# Patient Record
Sex: Female | Born: 1950
Health system: Southern US, Community
[De-identification: ages and names within clinical notes are randomized; demographics above are authoritative.]

## PROBLEM LIST (undated history)

## (undated) DIAGNOSIS — M797 Fibromyalgia: Secondary | ICD-10-CM

## (undated) DIAGNOSIS — K219 Gastro-esophageal reflux disease without esophagitis: Secondary | ICD-10-CM

## (undated) DIAGNOSIS — F329 Major depressive disorder, single episode, unspecified: Secondary | ICD-10-CM

## (undated) DIAGNOSIS — F1921 Other psychoactive substance dependence, in remission: Secondary | ICD-10-CM

## (undated) DIAGNOSIS — M1990 Unspecified osteoarthritis, unspecified site: Secondary | ICD-10-CM

## (undated) DIAGNOSIS — F32A Depression, unspecified: Secondary | ICD-10-CM

## (undated) DIAGNOSIS — Z972 Presence of dental prosthetic device (complete) (partial): Secondary | ICD-10-CM

## (undated) HISTORY — PX: TUBAL LIGATION: SHX77

## (undated) HISTORY — PX: BREAST SURGERY: SHX581

---

## 2006-03-31 ENCOUNTER — Emergency Department: Payer: Self-pay | Admitting: Emergency Medicine

## 2006-04-05 ENCOUNTER — Emergency Department: Payer: Self-pay | Admitting: Emergency Medicine

## 2006-04-16 ENCOUNTER — Emergency Department: Payer: Self-pay | Admitting: Emergency Medicine

## 2007-07-16 ENCOUNTER — Emergency Department: Payer: Self-pay | Admitting: Emergency Medicine

## 2007-07-21 ENCOUNTER — Emergency Department: Payer: Self-pay | Admitting: Emergency Medicine

## 2007-12-20 ENCOUNTER — Other Ambulatory Visit: Payer: Self-pay

## 2007-12-20 ENCOUNTER — Emergency Department: Payer: Self-pay | Admitting: Emergency Medicine

## 2008-05-19 ENCOUNTER — Observation Stay: Payer: Self-pay | Admitting: Internal Medicine

## 2008-05-20 ENCOUNTER — Other Ambulatory Visit: Payer: Self-pay

## 2008-07-18 ENCOUNTER — Other Ambulatory Visit: Payer: Self-pay

## 2008-07-18 ENCOUNTER — Inpatient Hospital Stay: Payer: Self-pay | Admitting: Unknown Physician Specialty

## 2009-05-13 ENCOUNTER — Emergency Department: Payer: Self-pay | Admitting: Emergency Medicine

## 2009-10-25 ENCOUNTER — Ambulatory Visit: Payer: Self-pay | Admitting: Pain Medicine

## 2012-04-18 ENCOUNTER — Emergency Department: Payer: Self-pay | Admitting: Emergency Medicine

## 2012-04-18 LAB — CBC
HCT: 33.3 % — ABNORMAL LOW (ref 35.0–47.0)
HGB: 11.6 g/dL — ABNORMAL LOW (ref 12.0–16.0)
MCH: 34.2 pg — ABNORMAL HIGH (ref 26.0–34.0)
MCHC: 34.8 g/dL (ref 32.0–36.0)
MCV: 98 fL (ref 80–100)
Platelet: 157 10*3/uL (ref 150–440)
RBC: 3.39 10*6/uL — ABNORMAL LOW (ref 3.80–5.20)
RDW: 12.6 % (ref 11.5–14.5)
WBC: 9.8 10*3/uL (ref 3.6–11.0)

## 2012-04-18 LAB — URINALYSIS, COMPLETE
Bilirubin,UR: NEGATIVE
Ketone: NEGATIVE
Nitrite: NEGATIVE
Protein: 30
RBC,UR: 6 /HPF (ref 0–5)
Specific Gravity: 1.013 (ref 1.003–1.030)
Squamous Epithelial: 3

## 2012-04-18 LAB — BASIC METABOLIC PANEL
Anion Gap: 5 — ABNORMAL LOW (ref 7–16)
Calcium, Total: 8.5 mg/dL (ref 8.5–10.1)
Co2: 29 mmol/L (ref 21–32)
Creatinine: 0.71 mg/dL (ref 0.60–1.30)
EGFR (Non-African Amer.): >60
Glucose: 101 mg/dL — ABNORMAL HIGH (ref 65–99)
Potassium: 3.9 mmol/L (ref 3.5–5.1)
Sodium: 136 mmol/L (ref 136–145)

## 2012-04-20 LAB — URINE CULTURE

## 2014-01-01 LAB — COMPREHENSIVE METABOLIC PANEL
ALT: 81 U/L — AB (ref 12–78)
Albumin: 3.2 g/dL — ABNORMAL LOW (ref 3.4–5.0)
Alkaline Phosphatase: 77 U/L
Anion Gap: 6 — ABNORMAL LOW (ref 7–16)
BILIRUBIN TOTAL: 0.4 mg/dL (ref 0.2–1.0)
BUN: 17 mg/dL (ref 7–18)
CHLORIDE: 97 mmol/L — AB (ref 98–107)
CO2: 27 mmol/L (ref 21–32)
Calcium, Total: 8.7 mg/dL (ref 8.5–10.1)
Creatinine: 1.3 mg/dL (ref 0.60–1.30)
GFR CALC AF AMER: 51 — AB
GFR CALC NON AF AMER: 44 — AB
Glucose: 153 mg/dL — ABNORMAL HIGH (ref 65–99)
Osmolality: 265 (ref 275–301)
Potassium: 4.2 mmol/L (ref 3.5–5.1)
SGOT(AST): 85 U/L — ABNORMAL HIGH (ref 15–37)
Sodium: 130 mmol/L — ABNORMAL LOW (ref 136–145)
TOTAL PROTEIN: 7.5 g/dL (ref 6.4–8.2)

## 2014-01-01 LAB — CBC
HCT: 36.9 % (ref 35.0–47.0)
HGB: 12.7 g/dL (ref 12.0–16.0)
MCH: 33.6 pg (ref 26.0–34.0)
MCHC: 34.4 g/dL (ref 32.0–36.0)
MCV: 98 fL (ref 80–100)
Platelet: 134 10*3/uL — ABNORMAL LOW (ref 150–440)
RBC: 3.78 10*6/uL — AB (ref 3.80–5.20)
RDW: 12.9 % (ref 11.5–14.5)
WBC: 5.6 10*3/uL (ref 3.6–11.0)

## 2014-01-01 LAB — SALICYLATE LEVEL: SALICYLATES, SERUM: 2.5 mg/dL

## 2014-01-01 LAB — ETHANOL
Ethanol %: 0.009 % (ref 0.000–0.080)
Ethanol: 9 mg/dL

## 2014-01-01 LAB — ACETAMINOPHEN LEVEL: Acetaminophen: <2 ug/mL

## 2014-01-02 ENCOUNTER — Observation Stay: Payer: Self-pay | Admitting: Internal Medicine

## 2014-01-02 LAB — URINALYSIS, COMPLETE
BILIRUBIN, UR: NEGATIVE
Bilirubin,UR: NEGATIVE
Blood: NEGATIVE
Blood: NEGATIVE
Glucose,UR: NEGATIVE mg/dL (ref 0–75)
Glucose,UR: NEGATIVE mg/dL (ref 0–75)
Hyaline Cast: 10
KETONE: NEGATIVE
Ketone: NEGATIVE
Nitrite: NEGATIVE
Nitrite: NEGATIVE
PROTEIN: NEGATIVE
Ph: 6 (ref 4.5–8.0)
Ph: 5 (ref 4.5–8.0)
Protein: NEGATIVE
SPECIFIC GRAVITY: 1.015 (ref 1.003–1.030)
Specific Gravity: 1.011 (ref 1.003–1.030)
Squamous Epithelial: 1
WBC UR: 22 /HPF (ref 0–5)

## 2014-01-02 LAB — DRUG SCREEN, URINE
AMPHETAMINES, UR SCREEN: NEGATIVE (ref ?–1000)
AMPHETAMINES, UR SCREEN: NEGATIVE (ref ?–1000)
BARBITURATES, UR SCREEN: NEGATIVE (ref ?–200)
BENZODIAZEPINE, UR SCRN: NEGATIVE (ref ?–200)
Barbiturates, Ur Screen: NEGATIVE (ref ?–200)
Benzodiazepine, Ur Scrn: NEGATIVE (ref ?–200)
CANNABINOID 50 NG, UR ~~LOC~~: POSITIVE (ref ?–50)
COCAINE METABOLITE, UR ~~LOC~~: POSITIVE (ref ?–300)
COCAINE METABOLITE, UR ~~LOC~~: POSITIVE (ref ?–300)
Cannabinoid 50 Ng, Ur ~~LOC~~: POSITIVE (ref ?–50)
MDMA (Ecstasy)Ur Screen: NEGATIVE (ref ?–500)
MDMA (Ecstasy)Ur Screen: NEGATIVE (ref ?–500)
METHADONE, UR SCREEN: NEGATIVE (ref ?–300)
Methadone, Ur Screen: NEGATIVE (ref ?–300)
OPIATE, UR SCREEN: NEGATIVE (ref ?–300)
Opiate, Ur Screen: NEGATIVE (ref ?–300)
PHENCYCLIDINE (PCP) UR S: NEGATIVE (ref ?–25)
Phencyclidine (PCP) Ur S: NEGATIVE (ref ?–25)
TRICYCLIC, UR SCREEN: NEGATIVE (ref ?–1000)
Tricyclic, Ur Screen: NEGATIVE (ref ?–1000)

## 2014-01-03 LAB — CBC WITH DIFFERENTIAL/PLATELET
BASOS ABS: 0 10*3/uL (ref 0.0–0.1)
Basophil %: 0.5 %
EOS PCT: 9.7 %
Eosinophil #: 0.3 10*3/uL (ref 0.0–0.7)
HCT: 30.5 % — AB (ref 35.0–47.0)
HGB: 10.4 g/dL — ABNORMAL LOW (ref 12.0–16.0)
LYMPHS PCT: 48.3 %
Lymphocyte #: 1.3 10*3/uL (ref 1.0–3.6)
MCH: 33.8 pg (ref 26.0–34.0)
MCHC: 34.3 g/dL (ref 32.0–36.0)
MCV: 99 fL (ref 80–100)
MONO ABS: 0.7 x10 3/mm (ref 0.2–0.9)
Monocyte %: 24.3 %
NEUTROS ABS: 0.5 10*3/uL — AB (ref 1.4–6.5)
Neutrophil %: 17.2 %
PLATELETS: 84 10*3/uL — AB (ref 150–440)
RBC: 3.09 10*6/uL — AB (ref 3.80–5.20)
RDW: 12.7 % (ref 11.5–14.5)
WBC: 2.7 10*3/uL — ABNORMAL LOW (ref 3.6–11.0)

## 2014-01-03 LAB — BASIC METABOLIC PANEL
ANION GAP: 3 — AB (ref 7–16)
BUN: 14 mg/dL (ref 7–18)
CALCIUM: 7.4 mg/dL — AB (ref 8.5–10.1)
Chloride: 108 mmol/L — ABNORMAL HIGH (ref 98–107)
Co2: 28 mmol/L (ref 21–32)
Creatinine: 0.79 mg/dL (ref 0.60–1.30)
Glucose: 97 mg/dL (ref 65–99)
Osmolality: 278 (ref 275–301)
POTASSIUM: 3.6 mmol/L (ref 3.5–5.1)
Sodium: 139 mmol/L (ref 136–145)

## 2014-01-03 LAB — URINE CULTURE

## 2014-01-03 LAB — MAGNESIUM: MAGNESIUM: 1.9 mg/dL

## 2015-03-27 NOTE — H&P (Signed)
PATIENT NAME:  Vanessa Oneal, BOEHRINGER MR#:  [id number] DATE OF BIRTH:  11-Oct-1951  DATE OF ADMISSION:  01/01/2014  ADMITTING PHYSICIAN: Enid Baas, MD  PRIMARY CARE PHYSICIAN: Dr. Leta Baptist at [facility].   CHIEF COMPLAINT: Difficulty walking and confusion.   HISTORY OF PRESENT ILLNESS: Vanessa Oneal is a 64 year old Caucasian female with past medical history significant for ongoing smoking, depression, anxiety, and history of heroin abuse, currently on Suboxone, presents to the hospital secondary to confusion that started after  she got a pill from her granddaughter, which was supposed to be Dramamine. The patient says she was nauseous yesterday and wanted to take a dramamine to help with the nausea. Her granddaughter, who just came into the city, said that she had a Dramamine pill which will help with the nausea. The patient was given the pill, and she is not even sure if  she was given Dramamine or not. However, after taking the pill, the patient just felt confused and weird and called the ambulance and was brought to the ER. Since coming in to the ER, her blood work was negative. Her alcohol level was normal. However, urine tox screen was not done. The patient was also seen by behavioral medicine to see if she needs admission because of her confusion, though CT head was negative. However, at the time of my exam, the patient is alert and oriented, is very anxious and restless with myoclonic jerks. She was weak and could not even walk to the bathroom and was very unsteady, so medical admission was requested.   PAST MEDICAL HISTORY: 1.  Depression.  2.  History of heroin abuse.  3.  Anxiety.  4.  Degenerative arthritis.   PAST SURGICAL HISTORY: Breast implant and removal of breast implant after rupture.   ALLERGIES TO MEDICATIONS: CODEINE.   HOME MEDICATIONS:   1.  Suboxone 8 mg twice a day.  2.  Klonopin 0.5 mg p.o. b.i.d.   SOCIAL HISTORY: Lives at home with her sister. Smokes about 1/2 pack  per day. No alcohol use.  FAMILY HISTORY: Heart disease runs in the family.   REVIEW OF SYSTEMS:    CONSTITUTIONAL: No fever. Positive for fatigue and weakness.  EYES: No blurred vision, double vision, pain, inflammation or glaucoma.  EARS, NOSE, THROAT: No tinnitus, ear pain, hearing loss, epistaxis or discharge.  RESPIRATORY: Positive for cough that started today and sounds congested. No hemoptysis or COPD.  CARDIOVASCULAR: No chest pain, orthopnea, edema, arrhythmia, palpitations or syncope.  GASTROINTESTINAL: No nausea now or vomiting, abdominal pain, hematemesis or melena.  GENITOURINARY: No dysuria, hematuria, renal calculus, frequency or incontinence.  ENDOCRINE: No polyuria, nocturia, thyroid problems, heat or cold intolerance.  HEMATOLOGIC: No anemia, easy bruising or bleeding.  SKIN: No acne, rash or lesions.  MUSCULOSKELETAL: No neck, back, shoulder pain, arthritis or gout.  NEUROLOGIC: No numbness, weakness, CVA, TIA or seizures, but very ataxic gait.  PSYCHOLOGICAL: Positive for anxiety. No  insomnia or depression.   PHYSICAL EXAMINATION: VITAL SIGNS: Temperature 98.5 degrees Fahrenheit, pulse 94, respirations 18, blood pressure 99/54, pulse oximetry 96% on room air.  GENERAL: thin built, well nourished female lying in bed, not in any acute distress.  HEENT: Normocephalic, atraumatic. Pupils equal, round, reacting to light. Anicteric sclerae. Extraocular movements intact. Oropharynx clear without erythema, mass or exudates.  NECK: Supple. No thyromegaly, JVD or carotid bruits. No lymphadenopathy.  LUNGS: Moving air bilaterally. Decreased bibasilar breath sounds. No wheeze or crackles. No use of accessory muscles for breathing.  CARDIOVASCULAR:  S1, S2, regular rate and rhythm. No murmurs, rubs or gallops.  ABDOMEN: Soft, nontender, nondistended. No hepatosplenomegaly. Normal bowel sounds.  EXTREMITIES: No pedal edema, no clubbing or cyanosis, 2+ dorsalis pedis pulses palpable  bilaterally.  SKIN: No acne, rash or lesions.  LYMPHATIC: No cervical lymphadenopathy.  NEUROLOGIC: Cranial nerves II through XII remain intact. Motor strength in bed is almost 5/5 all 4 extremities, and sensation is intact. However, she does have myoclonic jerks and very unsteady when even trying to stand up.  PSYCHIATRIC: The patient is awake, alert, oriented x 3 at this time.   LABORATORY AND RADIOLOGICAL DATA: WBC 5.6, hemoglobin 12.7, hematocrit 36.9, platelet count 134.   Sodium 130, potassium 4.2, chloride 97, bicarbonate 27, BUN 17, creatinine 1.3, glucose 153, and calcium of 8.7.   ALT 81, AST 85, alkaline phosphatase 77, total bilirubin 0.4, albumin of 3.2.   Alcohol level is negative. Serum acetaminophen and salicylate levels are negative. Urine tox screen is pending at this time.   Urinalysis with 3+ leukocyte esterase and trace bacteria and some WBCs noted.   CT of the head showing mild generalized atrophy and no acute intracranial finding.   ASSESSMENT AND PLAN: A 64 year old female with a history of heroin abuse in the past, on Suboxone currently, and anxiety, brought for confusion and ataxia after taking an unknown pill.  1.  Altered mental status and ataxia, likely metabolic in nature after taking an unknown pill/drug given by granddaughter. Mental status has improved, but still has some myoclonic jerks and ataxia. Urine drug screen is pending at this time, unfortunately was not done yesterday. Alcohol level is negative. Will admit under observation, give fluids, physical therapy. CT of the head is negative at this time. Continue to monitor.  2.  History of heroin abuse: Restart Suboxone, otherwise will go into withdrawals. 3.  Anxiety: Continue Klonopin.  4.  Urinary tract infection: Levaquin for 3 days.  5.  Tobacco use disorder: Counseled against smoking for 3 minutes. The patient refused a nicotine patch or Nicotrol inhaler at this time.   CODE STATUS: Full code.    TIME SPENT ON ADMISSION: 50 minutes.   ____________________________ Enid Baas, MD rk:jcm D: 01/02/2014 17:22:44 ET T: 01/02/2014 18:19:13 ET JOB#: [id number]  cc: Enid Baas, MD, <Dictator> Thea Alken, MD Enid Baas MD ELECTRONICALLY SIGNED 01/08/2014 15:08

## 2015-03-27 NOTE — Consult Note (Signed)
Brief Consult Note: Diagnosis: Opioid dependence, anxiety disorder NOS.   Patient was seen by consultant.   Consult note dictated.   Recommend further assessment or treatment.   Comments: Ms. Irion has a h/o anxiety, mood instability and substance abuse. She has been on Suboxone. She was brought to the ER after ingesting Dramamine with her Suboxone that made her feel nauseated and "weird". She denies that it was suicide attempt. See full psychiatric consult.  PLAN: 1. The patient does not meet criteria for IVC.  2. Please continue Suboxone.  3. No medications recommended at this point.  Electronic Signatures: Kristine Linea (MD)  (Signed 30-Jan-15 20:01)  Authored: Brief Consult Note   Last Updated: 30-Jan-15 20:01 by Kristine Linea (MD)

## 2015-03-27 NOTE — Consult Note (Signed)
PATIENT NAME:  Vanessa Oneal, Vanessa Oneal MR#:  [id number] DATE OF BIRTH:  [birth year]  DATE OF CONSULTATION:  01/02/2014  REFERRING PHYSICIAN:  Dr. Carollee Massed. CONSULTING PHYSICIAN:  Isahi Godwin B. Rorik Vespa, MD  REASON FOR CONSULTATION:  To evaluate a patient after overdose.   IDENTIFYING DATA:  Vanessa Oneal is a 64 year old female with history of substance abuse, depression and anxiety.   CHIEF COMPLAINT:  "I feel weak."    HISTORY OF PRESENT ILLNESS:  I was asked to evaluate Vanessa Oneal when she was still in the Emergency Room.  The patient was brought to the Emergency Room with some confusion, feeling badly, weak, unable to walk with difficulties thinking.  She believes that she took a dose of Dramamine together with Suboxone.  Suboxone is prescribed by Dr. Creola Corn at the [facility].  The Dramamine that the patient has never tried before was given by her granddaughter.  The combination turned very uncomfy and she ended up in the hospital.  At the time of evaluation the patient felt very weak and even though she did not want to come to the hospital, she felt that she needs to stay here a little longer.  Interestingly enough, when Vanessa Oneal was brought to the hospital her granddaughter and her son followed.  They both are currently patients in our Emergency Room.  They all abuse substances and it is impossible to find out which substances or medications were used.  Unfortunately at the time of admission, no urine tox screen was performed.  We know that the patient was negative for alcohol.  Two medications listed as prescribed is Suboxone and Klonopin.  Klonopin is given by her primary care physician at [facility].  At the time when I evaluated the patient she felt much better mentally, was no longer confused and could think straight, did not have any psychotic symptoms or excessive anxiety physically, however she felt exhausted.  She was eventually admitted to the medical floor for ataxia.  Of note,  when I reviewed the patient's chart I noticed that she was hospitalized at [facility] for depression and substance abuse in August of 2009.  At that time a thorough neurological examination was performed by Dr. Alycia Rossetti and there were multiple findings.  The patient walked with a limp.  She complained that she could not feel anything in her right leg and indeed Dr. Alycia Rossetti noticed that there was decreased vibration sensitivity, light touch and pain in the right lower extremity.  The patient also complained of cramping.  Dr. Alycia Rossetti describes "peculiar movement disorder.  The patient was tremulous on both, slightly on the finger-to-nose and occasionally had some jerks that resembled myoclonus."  The patient has been seen by a neurologist.  She is being treated for fibromyalgia.   PAST PSYCHIATRIC HISTORY:  There is a history of substance use and two hospitalizations, one at [facility] in 2009 for depression and substance abuse.  She has a history of pain killers and heroin addiction and has been on Suboxone for 5 years now.  She is doing fine and denies using illicit substances.  She denies ever attempting suicide.  She denies ever being hospitalized for substances abuse treatment.   PAST MEDICAL HISTORY:  Arthritis, fibromyalgia.   ALLERGIES:  CODEINE.   MEDICATIONS ON ADMISSION:  Suboxone 8 mg twice daily, Klonopin 0.5 mg twice daily.   SOCIAL HISTORY:  She lives with her sister who is disabled and on oxygen and  reportedly the patient takes care of her.  She has Medicaid.   REVIEW OF SYSTEMS:  CONSTITUTIONAL:  No fevers or chills.  No weight changes.  EYES:  No double or blurred vision.  EARS, NOSE, THROAT:  No hearing loss.  RESPIRATORY:  No shortness of breath or cough.  CARDIOVASCULAR:  No chest pain or orthopnea.  GASTROINTESTINAL:  No abdominal pain, nausea, vomiting, or diarrhea.  GENITOURINARY:  No incontinence or frequency.   ENDOCRINE:  No heat or cold intolerance.  LYMPHATIC:  No anemia or easy bruising.  INTEGUMENTARY:  No acne or rash.  MUSCULOSKELETAL:  Positive for pain and now weakness following ingestion of Dramamine.  NEUROLOGIC:  Positive for myoclonic jerks and unsteady gait.  PSYCHIATRIC:  See history of present illness for details.   PHYSICAL EXAMINATION: VITAL SIGNS:  Blood pressure 109/70, pulse 76, respirations 18, temperature 98.4.  GENERAL:  This is a slender female complaining of weakness.  The rest of the physical examination is deferred to her primary attending.   LABORATORY DATA:  Chemistries:  Blood glucose 153, BUN 17, creatinine 1.3, sodium 130, potassium 4.2.  Blood alcohol level is 0.009.  LFTs within normal limits except for AST of 85 and ALT 81.  Urine tox screen is positive for cocaine and cannabinoids.  It was not available earlier.  CBC within normal limits.  Urinalysis is suggestive of urinary tract infection with 3+ leukocyte esterase and 25 white cells per field.  Serum acetaminophen and salicylate are low.   MENTAL STATUS EXAMINATION:  The patient is alert and oriented to person, place and situation.  She is uncertain what happened to bring her to the hospital and believes that this was ingestion of Dramamine together with Suboxone.  She reports that never in her life she took Dramamine before and that it was given by her granddaughter.  She is pleasant, polite and cooperative.  She maintains good eye contact.  She is wearing hospital scrubs and a yellow shirt.  Her speech is soft.  Mood is fine with anxious affect.  Thought process is logical and goal-oriented.  Thought content:  She denies suicidal or homicidal ideation.  Adamantly denies that she overdosed on purpose.  There are no delusions or paranoia.  There are no auditory or visual hallucinations.  Her cognition is grossly intact. She registers 3/3 and recalls 3/3 objects after 5 minutes. She can spell "world" forward and  backward. She knows current president. Her intelligence and fund of knowledge are average.  Her insight and judgment are poor.   DIAGNOSES: AXIS I:  Opiate dependence, major depressive disorder by history, anxiety disorder, not otherwise specified.  AXIS II:  Deferred.  AXIS III:  Arthritis, fibromyalgia, status post ingestion of Dramamine, myoclonus.  AXIS IV:  Mental and physical illness, substance abuse.  AXIS V:  Global assessment of functioning 50.   PLAN:   1.  The patient does not meet criteria for involuntary inpatient psychiatric commitment.  2.  Apparently the patient will be admitted to medicine.   3.  We do not recommend any medications at this point except to continue Suboxone.  4.  Psychiatry will follow up if necessary.  She will follow up with [facility].    ____________________________ Ellin Goodie. Jennet Maduro, MD jbp:ea D: 01/02/2014 19:55:35 ET T: 01/02/2014 22:46:08 ET JOB#: [id number]  cc: Hadiyah Maricle B. Jennet Maduro, MD, <Dictator> Shari Prows MD ELECTRONICALLY SIGNED 01/31/2014 6:54

## 2015-03-27 NOTE — Discharge Summary (Signed)
PATIENT NAME:  Vanessa Oneal, Vanessa Oneal MR#:  [id number] DATE OF BIRTH:  1951/11/26  DATE OF ADMISSION:  01/02/2014 DATE OF DISCHARGE:  01/05/2014  ADMITTING PHYSICIAN: Dr. Enid Baas.    DISCHARGING PHYSICIAN: Dr. Enid Baas.   PRIMARY CARE PHYSICIAN: Nonlocal at [facility].   CONSULTATIONS IN THE HOSPITAL: Psychiatric consultation by Dr. Jennet Maduro.   DISCHARGE DIAGNOSES:  1. Metabolic encephalopathy.  2. Ataxia.  3. Urinary tract infection.  4. Chronic obstructive pulmonary disease exacerbation with bronchitis.  5. History of heroin abuse, currently on Suboxone.  6. Anxiety disorder.  7. Polysubstance abuse.    DISCHARGE HOME MEDICATIONS:  1. Prednisone taper over the next 3 days.  2. Advair 250/50 one puff b.i.d.  3. Suboxone 8 mg sublingual twice a day.  4. Klonopin 0.5 mg q.8 hours p.r.n. for anxiety.  5. Combivent Respimat 1 puff 4 times a day.  6. Levaquin 500 mg daily for 4 more days.   DISCHARGE HOME OXYGEN: None.   DISCHARGE DIET: Regular diet.   DISCHARGE ACTIVITY: As tolerated.   FOLLOWUP INSTRUCTIONS:  1. PCP followup in 1 to 2 weeks.  2. Psychiatric followup as recommended.   LABS AND IMAGING STUDIES PRIOR TO DISCHARGE: WBC 2.7, hemoglobin 10.4, hematocrit 30.5, platelet count 84.   Sodium 139, potassium 3.6, chloride 108, bicarb 28, BUN 14, creatinine 0.7, glucose 97 and calcium of 7.4. Magnesium 1.9. Urinalysis with 3+ leuk esterase, a few bacteria and 19 WBCs. Urine tox screen positive for cocaine and marijuana. Urine cultures are negative. CT of the head showing mild atrophy and no acute intracranial changes. Alcohol level, salicylate and acetaminophen level were negative on admission.   BRIEF HOSPITAL COURSE: Vanessa Oneal is a 64 year old Caucasian female very cachectic appearing with ongoing history of smoking, anxiety and history of heroin abuse in the past. Presented to the hospital secondary to worsening ataxia and change in mental status. The patient  was in the ED behavioral medicine observation for 1 day for her changes; however, with no improvement, medical consult was requested. She was admitted to the medical floor for metabolic encephalopathy secondary to an unknown pill given by her granddaughter. The patient does have a history of heroin abuse and is currently on Suboxone. Urine tox was positive for cocaine and marijuana. Her labs also showed positive UTI.   1. Ataxia and metabolic encephalopathy: Likely drug induced. The patient was very ataxic working with physical therapy who recommended rehab initially; however, with IV hydration and over time, her mental status improved and she was able to ambulate without any effort, so is being discharged home.  2. UTI: She was on Levaquin.  3. COPD exacerbation with acute bronchitis with ongoing smoking: She refused nicotine patch while in the hospital. She was strongly counseled against smoking. Started on prednisone taper and Levaquin which improved her symptoms a lot.  4. Anxiety: The patient on Klonopin p.r.n.  5. History of heroin abuse: The patient goes to [facility] for her Suboxone and takes it twice a day.   Her course has been otherwise uneventful in the hospital.   DISCHARGE CONDITION: Stable.   DISCHARGE DISPOSITION: Home.   TIME SPENT ON DISCHARGE: 40 minutes.   ____________________________ Enid Baas, MD rk:gb D: 01/05/2014 15:58:55 ET T: 01/06/2014 02:12:08 ET JOB#: [id number]  cc: Enid Baas, MD, <Dictator> Enid Baas MD ELECTRONICALLY SIGNED 01/08/2014 15:09

## 2016-02-09 ENCOUNTER — Emergency Department
Admission: EM | Admit: 2016-02-09 | Discharge: 2016-02-09 | Disposition: A | Discharge status: Home / Self Care | Payer: Commercial Managed Care - HMO | Attending: Emergency Medicine | Admitting: Emergency Medicine

## 2016-02-09 ENCOUNTER — Encounter: Payer: Self-pay | Admitting: *Deleted

## 2016-02-09 DIAGNOSIS — F17200 Nicotine dependence, unspecified, uncomplicated: Secondary | ICD-10-CM | POA: Diagnosis not present

## 2016-02-09 DIAGNOSIS — F69 Unspecified disorder of adult personality and behavior: Secondary | ICD-10-CM | POA: Diagnosis present

## 2016-02-09 DIAGNOSIS — F1910 Other psychoactive substance abuse, uncomplicated: Secondary | ICD-10-CM

## 2016-02-09 LAB — COMPREHENSIVE METABOLIC PANEL
ALK PHOS: 82 U/L (ref 38–126)
ALT: 21 U/L (ref 14–54)
AST: 39 U/L (ref 15–41)
Albumin: 3.7 g/dL (ref 3.5–5.0)
Anion gap: 7 (ref 5–15)
BUN: 15 mg/dL (ref 6–20)
CALCIUM: 9.2 mg/dL (ref 8.9–10.3)
CHLORIDE: 100 mmol/L — AB (ref 101–111)
CO2: 30 mmol/L (ref 22–32)
CREATININE: 0.78 mg/dL (ref 0.44–1.00)
GFR calc Af Amer: >60 mL/min (ref >60)
GFR calc non Af Amer: >60 mL/min (ref >60)
GLUCOSE: 98 mg/dL (ref 65–99)
Potassium: 4.2 mmol/L (ref 3.5–5.1)
SODIUM: 137 mmol/L (ref 135–145)
Total Bilirubin: 0.6 mg/dL (ref 0.3–1.2)
Total Protein: 7.2 g/dL (ref 6.5–8.1)

## 2016-02-09 LAB — URINE DRUG SCREEN, QUALITATIVE (ARMC ONLY)
AMPHETAMINES, UR SCREEN: NOT DETECTED
BENZODIAZEPINE, UR SCRN: POSITIVE — AB
Barbiturates, Ur Screen: NOT DETECTED
CANNABINOID 50 NG, UR ~~LOC~~: NOT DETECTED
Cocaine Metabolite,Ur ~~LOC~~: POSITIVE — AB
MDMA (Ecstasy)Ur Screen: NOT DETECTED
Methadone Scn, Ur: NOT DETECTED
OPIATE, UR SCREEN: NOT DETECTED
PHENCYCLIDINE (PCP) UR S: NOT DETECTED
Tricyclic, Ur Screen: NOT DETECTED

## 2016-02-09 LAB — CBC
HEMATOCRIT: 32.4 % — AB (ref 35.0–47.0)
Hemoglobin: 11 g/dL — ABNORMAL LOW (ref 12.0–16.0)
MCH: 31.9 pg (ref 26.0–34.0)
MCHC: 34 g/dL (ref 32.0–36.0)
MCV: 93.7 fL (ref 80.0–100.0)
PLATELETS: 179 10*3/uL (ref 150–440)
RBC: 3.46 MIL/uL — ABNORMAL LOW (ref 3.80–5.20)
RDW: 13.5 % (ref 11.5–14.5)
WBC: 9.8 10*3/uL (ref 3.6–11.0)

## 2016-02-09 LAB — ETHANOL: Alcohol, Ethyl (B): 23 mg/dL — ABNORMAL HIGH (ref <5)

## 2016-02-09 NOTE — ED Notes (Signed)
Pt brought in by case worker.  Pt reports she has been stressed out and not eating and has a weight loss of 30 pounds recently.  Pt denies SI or HI.  Pt used cocaine last night.  Pt calm and cooperative.

## 2016-02-09 NOTE — Discharge Instructions (Signed)
Polysubstance Abuse °When people abuse more than one drug or type of drug it is called polysubstance or polydrug abuse. For example, many smokers also drink alcohol. This is one form of polydrug abuse. Polydrug abuse also refers to the use of a drug to counteract an unpleasant effect produced by another drug. It may also be used to help with withdrawal from another drug. People who take stimulants may become agitated. Sometimes this agitation is countered with a tranquilizer. This helps protect against the unpleasant side effects. Polydrug abuse also refers to the use of different drugs at the same time.  °Anytime drug use is interfering with normal living activities, it has become abuse. This includes problems with family and friends. Psychological dependence has developed when your mind tells you that the drug is needed. This is usually followed by physical dependence which has developed when continuing increases of drug are required to get the same feeling or "high". This is known as addiction or chemical dependency. A person's risk is much higher if there is a history of chemical dependency in the family. °SIGNS OF CHEMICAL DEPENDENCY °· You have been told by friends or family that drugs have become a problem. °· You fight when using drugs. °· You are having blackouts (not remembering what you do while using). °· You feel sick from using drugs but continue using. °· You lie about use or amounts of drugs (chemicals) used. °· You need chemicals to get you going. °· You are suffering in work performance or in school because of drug use. °· You get sick from use of drugs but continue to use anyway. °· You need drugs to relate to people or feel comfortable in social situations. °· You use drugs to forget problems. °"Yes" answered to any of the above signs of chemical dependency indicates there are problems. The longer the use of drugs continues, the greater the problems will become. °If there is a family history of  drug or alcohol use, it is best not to experiment with these drugs. Continual use leads to tolerance. After tolerance develops more of the drug is needed to get the same feeling. This is followed by addiction. With addiction, drugs become the most important part of life. It becomes more important to take drugs than participate in the other usual activities of life. This includes relating to friends and family. Addiction is followed by dependency. Dependency is a condition where drugs are now needed not just to get high, but to feel normal. °Addiction cannot be cured but it can be stopped. This often requires outside help and the care of professionals. Treatment centers are listed in the yellow pages under: Cocaine, Narcotics, and Alcoholics Anonymous. Most hospitals and clinics can refer you to a specialized care center. Talk to your caregiver if you need help. °  °This information is not intended to replace advice given to you by your health care provider. Make sure you discuss any questions you have with your health care provider. °  °Document Released: 07/12/2005 Document Revised: 02/12/2012 Document Reviewed: 11/25/2014 °Elsevier Interactive Patient Education ©2016 [employer] ° °

## 2016-02-09 NOTE — ED Notes (Signed)
Pt presents to ED after being sent by her counselor. Pt states she has lost 30 lbs over the last few months. Pt noted to be fidgety during assessment. Pt states she "did a few lines of cocaine last night" with a friend of hers. Pt denies SI/HI at this time. Pt states she came to ED for medical evaluation of weight loss and current health status. Denies any problems at this time.

## 2016-02-09 NOTE — ED Notes (Signed)
Went in to wake pt up and to tell pt that she was being discharged. Asked pt if there was someone that she could call and get to pick her up. Pt shook her head no. Pt stated, "I'm sick, I haven't taken any medicine since 9 this morning." This RN asked what kind of medication she was referring to. Pt mumbled, "Suboxon." Told pt that we don't have any of that here. Pt stated that she needed some kind of medicine while falling asleep mid-sentence. This RN reiterated to pt that she was being discharged. Pt stated that she needed her cellphone to call people because she didn't know any numbers from memory. This RN told pt that I would get her cellphone from her belongings so that she could call someone. This RN went to get cellphone from belongings, but was unable to find cellphone among the many bags that pt had in the green wagon. Went back in the ask pt if she could recall which bag her cellphone may be in, but pt was too sleepy to recall. Will try again later.

## 2016-02-09 NOTE — ED Provider Notes (Signed)
[facility] Emergency Department Provider Note  ____________________________________________   I have reviewed the triage vital signs and the nursing notes.   HISTORY  Chief Complaint Behavior Problem    HPI Vanessa Oneal is a 65 y.o. female who presents here today because she states she wants Suboxone. She states she ran out. She does admit to using cocaine and drinking alcohol today. Has no SI or HI. Patient states she has been losing weight because she has not wanted to eat. She denies any other complaint except for wanting Suboxone when she mentioned 4 times. She recently says she's had no nausea no vomiting no diarrhea. She denies any thoughts of herself or anybody else. He has no other complaints  No past medical history on file.  There are no active problems to display for this patient.   No past surgical history on file.  No current outpatient prescriptions on file.  Allergies Gabapentin  No family history on file.  Social History Social History  Substance Use Topics  . Smoking status: Current Every Day Smoker  . Smokeless tobacco: None  . Alcohol Use: Yes    Review of Systems Constitutional: No fever/chills Eyes: No visual changes. ENT: No sore throat. No stiff neck no neck pain Cardiovascular: Denies chest pain. Respiratory: Denies shortness of breath. Gastrointestinal:   no vomiting.  No diarrhea.  No constipation. Genitourinary: Negative for dysuria. Musculoskeletal: Negative lower extremity swelling Skin: Negative for rash. Neurological: Negative for headaches, focal weakness or numbness. 10-point ROS otherwise negative.  ____________________________________________   PHYSICAL EXAM:  VITAL SIGNS: ED Triage Vitals  Enc Vitals Group     BP 02/09/16 1707 126/76 mmHg     Pulse Rate 02/09/16 1707 87     Resp 02/09/16 1707 20     Temp 02/09/16 1707 98.3 F (36.8 C)     Temp Source 02/09/16 1707 Oral     SpO2 02/09/16  1707 96 %     Weight 02/09/16 1707 75 lb (34.02 kg)     Height --      Head Cir --      Peak Flow --      Pain Score 02/09/16 1711 8     Pain Loc --      Pain Edu? --      Excl. in GC? --     Constitutional: Alert and oriented. Patient is very poorly nourished at baseline in appearance but not otherwise toxic Eyes: Conjunctivae are normal. PERRL. EOMI. Head: Atraumatic. Nose: No congestion/rhinnorhea. Mouth/Throat: Mucous membranes are moist.  Oropharynx non-erythematous. Neck: No stridor.   Nontender with no meningismus Cardiovascular: Normal rate, regular rhythm. Grossly normal heart sounds.  Good peripheral circulation. Respiratory: Normal respiratory effort.  No retractions. Lungs CTAB. Abdominal: Soft and nontender. No distention. No guarding no rebound Back:  There is no focal tenderness or step off there is no midline tenderness there are no lesions noted. there is no CVA tenderness Musculoskeletal: No lower extremity tenderness. No joint effusions, no DVT signs strong distal pulses no edema Neurologic:  Normal speech and language. No gross focal neurologic deficits are appreciated.  Skin:  Skin is warm, dry and intact. No rash noted. Psychiatric: Mood and affect are normal. Speech and behavior are normal.  ____________________________________________   LABS (all labs ordered are listed, but only abnormal results are displayed)  Labs Reviewed  COMPREHENSIVE METABOLIC PANEL - Abnormal; Notable for the following:    Chloride 100 (*)    All other  components within normal limits  ETHANOL - Abnormal; Notable for the following:    Alcohol, Ethyl (B) 23 (*)    All other components within normal limits  CBC - Abnormal; Notable for the following:    RBC 3.46 (*)    Hemoglobin 11.0 (*)    HCT 32.4 (*)    All other components within normal limits  URINE DRUG SCREEN, QUALITATIVE ([facility] ONLY) - Abnormal; Notable for the following:    Cocaine Metabolite,Ur [place] POSITIVE (*)     Benzodiazepine, Ur Scrn POSITIVE (*)    All other components within normal limits   ____________________________________________  EKG  I personally interpreted any EKGs ordered by me or triage  ____________________________________________  RADIOLOGY  I reviewed any imaging ordered by me or triage that were performed during my shift and, if possible, patient and/or family made aware of any abnormal findings. ____________________________________________   PROCEDURES  Procedure(s) performed: None  Critical Care performed: None  ____________________________________________   INITIAL IMPRESSION / ASSESSMENT AND PLAN / ED COURSE  Pertinent labs & imaging results that were available during my care of the patient were reviewed by me and considered in my medical decision making (see chart for details).  Patient states she is not here because of her weight loss she is here because she wants me to give her narcotic pain medications. I am not going to do so. She states that she has had Suboxone here in the past and that is why she is here. There is no evidence of SI or HI in her customary screening exam does not show any acute emergency condition today. We'll refer her to outpatient follow-up for her chronic issues including weight loss and substance abuse. ____________________________________________   FINAL CLINICAL IMPRESSION(S) / ED DIAGNOSES  Final diagnoses:  None      This chart was dictated using voice recognition software.  Despite best efforts to proofread,  errors can occur which can change meaning.     Jeanmarie Plant, MD 02/09/16 [phone]

## 2016-02-09 NOTE — ED Notes (Signed)
Discharge instructions reviewed with patient. Patient verbalized understanding. Patient ambulated to lobby without difficulty. Pt's belongings taken to lobby in green peds wagon with her.

## 2016-02-09 NOTE — [facility] Counselor (Signed)
TTS counselor provided resources for [facility], [facility] and [facility] outpatient services.  Pt should also follow up with her addictions therapist at [facility].

## 2016-02-09 NOTE — ED Notes (Signed)
Brought in for Mental health eval

## 2016-05-22 DIAGNOSIS — E46 Unspecified protein-calorie malnutrition: Secondary | ICD-10-CM | POA: Insufficient documentation

## 2016-05-30 DIAGNOSIS — E559 Vitamin D deficiency, unspecified: Secondary | ICD-10-CM | POA: Insufficient documentation

## 2017-10-17 ENCOUNTER — Ambulatory Visit: Payer: Self-pay | Admitting: Family Medicine

## 2017-11-19 ENCOUNTER — Ambulatory Visit (INDEPENDENT_AMBULATORY_CARE_PROVIDER_SITE_OTHER): Payer: Medicare Other | Admitting: Family Medicine

## 2017-11-19 ENCOUNTER — Ambulatory Visit
Admission: RE | Admit: 2017-11-19 | Discharge: 2017-11-19 | Disposition: A | Discharge status: Home / Self Care | Payer: Medicare Other | Source: Ambulatory Visit | Attending: Family Medicine | Admitting: Family Medicine

## 2017-11-19 ENCOUNTER — Encounter: Payer: Self-pay | Admitting: Family Medicine

## 2017-11-19 VITALS — BP 129/77 | HR 69 | Temp 97.6°F | Ht 59.4 in | Wt 81.2 lb

## 2017-11-19 DIAGNOSIS — Z1211 Encounter for screening for malignant neoplasm of colon: Secondary | ICD-10-CM | POA: Diagnosis not present

## 2017-11-19 DIAGNOSIS — M791 Myalgia, unspecified site: Secondary | ICD-10-CM

## 2017-11-19 DIAGNOSIS — M542 Cervicalgia: Secondary | ICD-10-CM

## 2017-11-19 DIAGNOSIS — F1110 Opioid abuse, uncomplicated: Secondary | ICD-10-CM

## 2017-11-19 DIAGNOSIS — Z13220 Encounter for screening for lipoid disorders: Secondary | ICD-10-CM | POA: Diagnosis not present

## 2017-11-19 DIAGNOSIS — M19041 Primary osteoarthritis, right hand: Secondary | ICD-10-CM

## 2017-11-19 DIAGNOSIS — R202 Paresthesia of skin: Secondary | ICD-10-CM

## 2017-11-19 DIAGNOSIS — F419 Anxiety disorder, unspecified: Secondary | ICD-10-CM

## 2017-11-19 DIAGNOSIS — M5030 Other cervical disc degeneration, unspecified cervical region: Secondary | ICD-10-CM | POA: Insufficient documentation

## 2017-11-19 DIAGNOSIS — M4312 Spondylolisthesis, cervical region: Secondary | ICD-10-CM | POA: Insufficient documentation

## 2017-11-19 DIAGNOSIS — Z1159 Encounter for screening for other viral diseases: Secondary | ICD-10-CM | POA: Diagnosis not present

## 2017-11-19 DIAGNOSIS — Z23 Encounter for immunization: Secondary | ICD-10-CM | POA: Diagnosis not present

## 2017-11-19 DIAGNOSIS — M1990 Unspecified osteoarthritis, unspecified site: Secondary | ICD-10-CM | POA: Insufficient documentation

## 2017-11-19 DIAGNOSIS — Z1231 Encounter for screening mammogram for malignant neoplasm of breast: Secondary | ICD-10-CM

## 2017-11-19 DIAGNOSIS — M19042 Primary osteoarthritis, left hand: Secondary | ICD-10-CM

## 2017-11-19 DIAGNOSIS — Z1239 Encounter for other screening for malignant neoplasm of breast: Secondary | ICD-10-CM

## 2017-11-19 DIAGNOSIS — Z780 Asymptomatic menopausal state: Secondary | ICD-10-CM | POA: Diagnosis not present

## 2017-11-19 DIAGNOSIS — R636 Underweight: Secondary | ICD-10-CM

## 2017-11-19 MED ORDER — CITALOPRAM HYDROBROMIDE 20 MG PO TABS: ORAL_TABLET | ORAL | 3 refills | Status: DC

## 2017-11-19 NOTE — Patient Instructions (Addendum)
[facility] at [place] [facility]  Address: [address] [place], [place], [place] [address]  Phone: [phone]  [facility] [phone]       Influenza (Flu) Vaccine (Inactivated or Recombinant): What You Need to Know 1. Why get vaccinated? Influenza ("flu") is a contagious disease that spreads around the [place] every year, usually between October and May. Flu is caused by influenza viruses, and is spread mainly by coughing, sneezing, and close contact. Anyone can get flu. Flu strikes suddenly and can last several days. Symptoms vary by age, but can include:  fever/chills  sore throat  muscle aches  fatigue  cough  headache  runny or stuffy nose  Flu can also lead to pneumonia and blood infections, and cause diarrhea and seizures in children. If you have a medical condition, such as heart or lung disease, flu can make it worse. Flu is more dangerous for some people. Infants and young children, people 66 years of age and older, pregnant women, and people with certain health conditions or a weakened immune system are at greatest risk. Each year thousands of people in the [place] die from flu, and many more are hospitalized. Flu vaccine can:  keep you from getting flu,  make flu less severe if you do get it, and  keep you from spreading flu to your family and other people. 2. Inactivated and recombinant flu vaccines A dose of flu vaccine is recommended every flu season. Children 6 months through 22 years of age may need two doses during the same flu season. Everyone else needs only one dose each flu season. Some inactivated flu vaccines contain a very small amount of a mercury-based preservative called thimerosal. Studies have not shown thimerosal in vaccines to be harmful, but flu vaccines that do not contain thimerosal are available. There is no live flu virus in flu shots. They cannot cause the flu. There are many flu  viruses, and they are always changing. Each year a new flu vaccine is made to protect against three or four viruses that are likely to cause disease in the upcoming flu season. But even when the vaccine doesn't exactly match these viruses, it may still provide some protection. Flu vaccine cannot prevent:  flu that is caused by a virus not covered by the vaccine, or  illnesses that look like flu but are not.  It takes about 2 weeks for protection to develop after vaccination, and protection lasts through the flu season. 3. Some people should not get this vaccine Tell the person who is giving you the vaccine:  If you have any severe, life-threatening allergies. If you ever had a life-threatening allergic reaction after a dose of flu vaccine, or have a severe allergy to any part of this vaccine, you may be advised not to get vaccinated. Most, but not all, types of flu vaccine contain a small amount of egg protein.  If you ever had Guillain-Barr Syndrome (also called GBS). Some people with a history of GBS should not get this vaccine. This should be discussed with your doctor.  If you are not feeling well. It is usually okay to get flu vaccine when you have a mild illness, but you might be asked to come back when you feel better.  4. Risks of a vaccine reaction With any medicine, including vaccines, there is a chance of reactions. These are usually mild and go away on their own, but serious reactions are also possible. Most people  who get a flu shot do not have any problems with it. Minor problems following a flu shot include:  soreness, redness, or swelling where the shot was given  hoarseness  sore, red or itchy eyes  cough  fever  aches  headache  itching  fatigue  If these problems occur, they usually begin soon after the shot and last 1 or 2 days. More serious problems following a flu shot can include the following:  There may be a small increased risk of Guillain-Barre  Syndrome (GBS) after inactivated flu vaccine. This risk has been estimated at 1 or 2 additional cases per million people vaccinated. This is much lower than the risk of severe complications from flu, which can be prevented by flu vaccine.  Young children who get the flu shot along with pneumococcal vaccine (PCV13) and/or DTaP vaccine at the same time might be slightly more likely to have a seizure caused by fever. Ask your doctor for more information. Tell your doctor if a child who is getting flu vaccine has ever had a seizure.  Problems that could happen after any injected vaccine:  People sometimes faint after a medical procedure, including vaccination. Sitting or lying down for about 15 minutes can help prevent fainting, and injuries caused by a fall. Tell your doctor if you feel dizzy, or have vision changes or ringing in the ears.  Some people get severe pain in the shoulder and have difficulty moving the arm where a shot was given. This happens very rarely.  Any medication can cause a severe allergic reaction. Such reactions from a vaccine are very rare, estimated at about 1 in a million doses, and would happen within a few minutes to a few hours after the vaccination. As with any medicine, there is a very remote chance of a vaccine causing a serious injury or death. The safety of vaccines is always being monitored. For more information, visit: http://floyd.org/ 5. What if there is a serious reaction? What should I look for? Look for anything that concerns you, such as signs of a severe allergic reaction, very high fever, or unusual behavior. Signs of a severe allergic reaction can include hives, swelling of the face and throat, difficulty breathing, a fast heartbeat, dizziness, and weakness. These would start a few minutes to a few hours after the vaccination. What should I do?  If you think it is a severe allergic reaction or other emergency that can't wait, call 9-1-1 and get  the person to the nearest hospital. Otherwise, call your doctor.  Reactions should be reported to the Vaccine Adverse Event Reporting System (VAERS). Your doctor should file this report, or you can do it yourself through the VAERS web site at www.vaers.LAgents.no, or by calling 1-908 266 0710. ? VAERS does not give medical advice. 6. The National Vaccine Injury Compensation Program The [employer] Vaccine Injury Compensation Program (VICP) is a federal program that was created to compensate people who may have been injured by certain vaccines. Persons who believe they may have been injured by a vaccine can learn about the program and about filing a claim by calling 1-226-825-4013 or visiting the VICP website at SpiritualWord.at. There is a time limit to file a claim for compensation. 7. How can I learn more?  Ask your healthcare provider. He or she can give you the vaccine package insert or suggest other sources of information.  Call your local or state health department.  Contact the Centers for Disease Control and Prevention (CDC): ? Call (763) 561-9365 (  1-800-CDC-INFO) or ? Visit CDC's website at BiotechRoom.com.cy Vaccine Information Statement, Inactivated Influenza Vaccine (07/10/2014) This information is not intended to replace advice given to you by your health care provider. Make sure you discuss any questions you have with your health care provider. Document Released: 09/14/2006 Document Revised: 08/10/2016 Document Reviewed: 08/10/2016 Elsevier Interactive Patient Education  2017 [employer].

## 2017-11-19 NOTE — Assessment & Plan Note (Signed)
Stable on suboxone. Continue current regimen to continue to follow with suboxone doctor.

## 2017-11-19 NOTE — Assessment & Plan Note (Signed)
Not under great control. Will increase her celexa and recheck 3-4 weeks. She would like to go back on her klonopin. Discussed that this is not a great idea on her suboxone. If she would like to get back on a benzo- will need to see psychiatry.

## 2017-11-19 NOTE — Progress Notes (Signed)
BP 129/77 (BP Location: Left Arm, Patient Position: Sitting, Cuff Size: Small)   Pulse 69   Temp 97.6 F (36.4 C)   Ht 4' 11.4" (1.509 m)   Wt 81 lb 3 oz (36.8 kg)   SpO2 95%   BMI 16.18 kg/m    Subjective:    Patient ID: Vanessa Oneal, female    DOB: [birth year], 66 y.o.   MRN: [id number]  HPI: Vanessa Oneal is a 66 y.o. female  Chief Complaint  Patient presents with  . Establish Care  . Other    Please order bone density   NUMBNESS in the L side of her face and into her neck and into her arm Duration: About a month Onset: sudden Location:  L side of her face and into her neck and into her arm Bilateral: no Symmetric: no Decreased sensation: yes  Weakness: no Pain: no Quality:  numbness Severity: moderate  Frequency: a few times a day for seconds at at time- numb for a couple of hours during the day Trauma: no Recent illness: no Diabetes: no Thyroid disease: no  HIV: no  Alcoholism: no  Spinal cord injury: no Alleviating factors: nothing Aggravating factors: nothing Status: fluctuating Treatments attempted: none   ANXIETY/STRESS Duration:stable Anxious mood: yes  Excessive worrying: no Irritability: no  Sweating: no Nausea: no Palpitations:no Hyperventilation: no Panic attacks: yes Agoraphobia: no  Obscessions/compulsions: no Depressed mood: yes Depression screen PHQ 2/9 11/19/2017  Decreased Interest 1  Down, Depressed, Hopeless 1  PHQ - 2 Score 2  Altered sleeping 1  Tired, decreased energy 1  Change in appetite 0  Feeling bad or failure about yourself  0  Trouble concentrating 1  Moving slowly or fidgety/restless 0  Suicidal thoughts 0  PHQ-9 Score 5    GAD 7 : Generalized Anxiety Score 11/19/2017  Nervous, Anxious, on Edge 3  Control/stop worrying 1  Worry too much - different things 2  Trouble relaxing 1  Restless 1  Easily annoyed or irritable 0  Afraid - awful might happen 1  Total GAD 7 Score 9  Anxiety Difficulty Somewhat  difficult   Anhedonia: no Weight changes: no Insomnia: no   Hypersomnia: no Fatigue/loss of energy: yes Feelings of worthlessness: no Feelings of guilt: no Impaired concentration/indecisiveness: no Suicidal ideations: no  Crying spells: no Recent Stressors/Life Changes: no  Active Ambulatory Problems    Diagnosis Date Noted  . Opiate abuse, continuous (HCC) 11/19/2017  . Anxiety disorder 11/19/2017  . Osteoarthritis 11/19/2017   Resolved Ambulatory Problems    Diagnosis Date Noted  . No Resolved Ambulatory Problems   No Additional Past Medical History   Past Surgical History:  Procedure Laterality Date  . BREAST SURGERY     Silicone removal  . TUBAL LIGATION     Outpatient Encounter Medications as of 11/19/2017  Medication Sig  . citalopram (CELEXA) 20 MG tablet TAKE ONE TABLET BY MOUTH EVERY DAY FOR ANXIETY  . SUBOXONE 8-2 MG FILM place ONE FILM UNDER THE TONGUE TWICE DAILY  . [DISCONTINUED] citalopram (CELEXA) 10 MG tablet TAKE ONE TABLET BY MOUTH EVERY DAY FOR ANXIETY   No facility-administered encounter medications on file as of 11/19/2017.    Allergies  Allergen Reactions  . Gabapentin Anaphylaxis   Social History   Socioeconomic History  . Marital status: Widowed    Spouse name: None  . Number of children: None  . Years of education: None  . Highest education level: None  Social Needs  .  Financial resource strain: None  . Food insecurity - worry: None  . Food insecurity - inability: None  . Transportation needs - medical: None  . Transportation needs - non-medical: None  Occupational History  . None  Tobacco Use  . Smoking status: Current Every Day Smoker  . Smokeless tobacco: Never Used  Substance and Sexual Activity  . Alcohol use: None    Comment: on occasion  . Drug use: No  . Sexual activity: Not Currently  Other Topics Concern  . None  Social History Narrative  . None   Family History  Problem Relation Age of Onset  . Suicidality  Father   . COPD Sister    Review of Systems  Constitutional: Negative for activity change, appetite change, chills, diaphoresis, fatigue, fever and unexpected weight change.  HENT: Negative.   Respiratory: Negative.   Cardiovascular: Negative.   Gastrointestinal: Negative.   Musculoskeletal: Positive for arthralgias, neck pain and neck stiffness. Negative for back pain, gait problem, joint swelling and myalgias.  Skin: Negative.   Neurological: Positive for weakness and numbness. Negative for dizziness, tremors, seizures, syncope, facial asymmetry, speech difficulty, light-headedness and headaches.  Hematological: Negative.   Psychiatric/Behavioral: Positive for dysphoric mood. Negative for agitation, behavioral problems, confusion, decreased concentration, hallucinations and self-injury. The patient is nervous/anxious. The patient is not hyperactive.     Per HPI unless specifically indicated above     Objective:    BP 129/77 (BP Location: Left Arm, Patient Position: Sitting, Cuff Size: Small)   Pulse 69   Temp 97.6 F (36.4 C)   Ht 4' 11.4" (1.509 m)   Wt 81 lb 3 oz (36.8 kg)   SpO2 95%   BMI 16.18 kg/m   Wt Readings from Last 3 Encounters:  11/19/17 81 lb 3 oz (36.8 kg)    Physical Exam  Constitutional: She is oriented to person, place, and time. She appears well-developed and well-nourished. No distress.  HENT:  Head: Normocephalic and atraumatic.  Right Ear: Hearing normal.  Left Ear: Hearing normal.  Nose: Nose normal.  Eyes: Conjunctivae and lids are normal. Right eye exhibits no discharge. Left eye exhibits no discharge. No scleral icterus.  Cardiovascular: Normal rate, regular rhythm, normal heart sounds and intact distal pulses. Exam reveals no gallop and no friction rub.  No murmur heard. Pulmonary/Chest: Effort normal and breath sounds normal. No respiratory distress. She has no wheezes. She has no rales. She exhibits no tenderness.  Musculoskeletal: She  exhibits tenderness and deformity (significant arthritic deformities of her hands bilaterally).  Neurological: She is alert and oriented to person, place, and time.  Skin: Skin is warm, dry and intact. No rash noted. She is not diaphoretic. No erythema. No pallor.  Psychiatric: She has a normal mood and affect. Her speech is normal and behavior is normal. Judgment and thought content normal. Cognition and memory are normal.  Nursing note and vitals reviewed.  Neck Exam:    Tenderness to Palpation: yes    Midline cervical spine: no    Paraspinal neck musculature: yes    Trapezius: yes    Sternocleidomastoid: yes     Range of Motion:     Flexion: Decreased    Extension: Decreased    Lateral rotation: Decreased    Lateral bending: Decreased     Neuro Examination: Upper extremity DTRs normal & symmetric.  Strength and sensation intact.    Special Tests:     Spurling test: positive on the L  No results found for  this or any previous visit.    Assessment & Plan:   Problem List Items Addressed This Visit      Musculoskeletal and Integument   Osteoarthritis    Will start naproxen vs voltaren pending renal function tomorrow.       Relevant Medications   SUBOXONE 8-2 MG FILM     Other   Opiate abuse, continuous (HCC)    Stable on suboxone. Continue current regimen to continue to follow with suboxone doctor.       Relevant Orders   Comprehensive metabolic panel   UA/M w/rflx Culture, Routine   Anxiety disorder - Primary    Not under great control. Will increase her celexa and recheck 3-4 weeks. She would like to go back on her klonopin. Discussed that this is not a great idea on her suboxone. If she would like to get back on a benzo- will need to see psychiatry.      Relevant Medications   citalopram (CELEXA) 20 MG tablet   Other Relevant Orders   CBC with Differential/Platelet   Comprehensive metabolic panel   Thyroid Panel With TSH   UA/M w/rflx Culture, Routine      Other Visit Diagnoses    Screening for breast cancer       Mammogram ordered today   Relevant Orders   MM Digital Screening   Need for hepatitis C screening test       Labs drawn today. Await results.    Relevant Orders   Hepatitis C antibody   Screening for colon cancer       Colonoscopy ordered today.   Relevant Orders   Ambulatory referral to Gastroenterology   Immunization due       Flu shot given today.   Relevant Orders   Flu vaccine HIGH DOSE PF (Fluzone High dose) (Completed)   Postmenopausal estrogen deficiency       Has never had DEXA before. Ordered today.   Relevant Orders   DG Bone Density   VITAMIN D 25 Hydroxy (Vit-D Deficiency, Fractures)   Paresthesias       Concern for nerve irritation from her neck. Will check labs. Will check x-ray of her neck. Await results.    Relevant Orders   CBC with Differential/Platelet   Comprehensive metabolic panel   Thyroid Panel With TSH   VITAMIN D 25 Hydroxy (Vit-D Deficiency, Fractures)   UA/M w/rflx Culture, Routine   DG Cervical Spine Complete   B12 and Folate Panel   Underweight       Labs drawn today. Await results- will check DEXA   Relevant Orders   Bayer DCA Hb A1c Waived   CBC with Differential/Platelet   Comprehensive metabolic panel   Thyroid Panel With TSH   VITAMIN D 25 Hydroxy (Vit-D Deficiency, Fractures)   UA/M w/rflx Culture, Routine   Screening for cholesterol level       Labs drawn today. Await results.    Relevant Orders   Lipid Panel w/o Chol/HDL Ratio   Neck pain       Concern for radiculopathy. labs and x-rays ordered. Await results.    Relevant Orders   VITAMIN D 25 Hydroxy (Vit-D Deficiency, Fractures)   DG Cervical Spine Complete   Myalgia       Concern for radiculopathy. labs and x-rays ordered. Await results.    Relevant Orders   VITAMIN D 25 Hydroxy (Vit-D Deficiency, Fractures)       Follow up plan: Return 3-4 weeks, for follow up  mood and numbness.

## 2017-11-19 NOTE — Assessment & Plan Note (Signed)
Will start naproxen vs voltaren pending renal function tomorrow.

## 2017-11-20 ENCOUNTER — Encounter: Payer: Self-pay | Admitting: *Deleted

## 2017-11-20 LAB — COMPREHENSIVE METABOLIC PANEL
ALT: 26 IU/L (ref 0–32)
AST: 47 IU/L — AB (ref 0–40)
Albumin/Globulin Ratio: 1.3 (ref 1.2–2.2)
Albumin: 4.3 g/dL (ref 3.6–4.8)
Alkaline Phosphatase: 81 IU/L (ref 39–117)
BUN/Creatinine Ratio: 23 (ref 12–28)
BUN: 15 mg/dL (ref 8–27)
Bilirubin Total: 0.3 mg/dL (ref 0.0–1.2)
CALCIUM: 9.2 mg/dL (ref 8.7–10.3)
CO2: 24 mmol/L (ref 20–29)
CREATININE: 0.66 mg/dL (ref 0.57–1.00)
Chloride: 99 mmol/L (ref 96–106)
GFR, EST AFRICAN AMERICAN: 106 mL/min/{1.73_m2} (ref >59)
GFR, EST NON AFRICAN AMERICAN: 92 mL/min/{1.73_m2} (ref >59)
Globulin, Total: 3.2 g/dL (ref 1.5–4.5)
Glucose: 85 mg/dL (ref 65–99)
Potassium: 4.4 mmol/L (ref 3.5–5.2)
Sodium: 138 mmol/L (ref 134–144)
TOTAL PROTEIN: 7.5 g/dL (ref 6.0–8.5)

## 2017-11-20 LAB — BAYER DCA HB A1C WAIVED: HB A1C (BAYER DCA - WAIVED): 4.9 % (ref <7.0)

## 2017-11-20 LAB — CBC WITH DIFFERENTIAL/PLATELET
BASOS: 1 %
Basophils Absolute: 0 10*3/uL (ref 0.0–0.2)
EOS (ABSOLUTE): 0.5 10*3/uL — AB (ref 0.0–0.4)
EOS: 8 %
HEMATOCRIT: 39.1 % (ref 34.0–46.6)
Hemoglobin: 13.3 g/dL (ref 11.1–15.9)
IMMATURE GRANS (ABS): 0 10*3/uL (ref 0.0–0.1)
IMMATURE GRANULOCYTES: 0 %
LYMPHS: 25 %
Lymphocytes Absolute: 1.6 10*3/uL (ref 0.7–3.1)
MCH: 32.4 pg (ref 26.6–33.0)
MCHC: 34 g/dL (ref 31.5–35.7)
MCV: 95 fL (ref 79–97)
MONOS ABS: 0.4 10*3/uL (ref 0.1–0.9)
Monocytes: 7 %
NEUTROS PCT: 59 %
Neutrophils Absolute: 3.7 10*3/uL (ref 1.4–7.0)
PLATELETS: 200 10*3/uL (ref 150–379)
RBC: 4.11 x10E6/uL (ref 3.77–5.28)
RDW: 14 % (ref 12.3–15.4)
WBC: 6.2 10*3/uL (ref 3.4–10.8)

## 2017-11-20 LAB — THYROID PANEL WITH TSH
FREE THYROXINE INDEX: 1.3 (ref 1.2–4.9)
T3 UPTAKE RATIO: 21 % — AB (ref 24–39)
T4 TOTAL: 6.3 ug/dL (ref 4.5–12.0)
TSH: 3.13 u[IU]/mL (ref 0.450–4.500)

## 2017-11-20 LAB — UA/M W/RFLX CULTURE, ROUTINE
Bilirubin, UA: NEGATIVE
GLUCOSE, UA: NEGATIVE
KETONES UA: NEGATIVE
Nitrite, UA: NEGATIVE
PROTEIN UA: NEGATIVE
RBC, UA: NEGATIVE
Urobilinogen, Ur: 0.2 mg/dL (ref 0.2–1.0)
pH, UA: 6.5 (ref 5.0–7.5)

## 2017-11-20 LAB — B12 AND FOLATE PANEL
Folate: 7.1 ng/mL (ref >3.0)
Vitamin B-12: 355 pg/mL (ref 232–1245)

## 2017-11-20 LAB — LIPID PANEL W/O CHOL/HDL RATIO
Cholesterol, Total: 201 mg/dL — ABNORMAL HIGH (ref 100–199)
HDL: 96 mg/dL (ref >39)
LDL Calculated: 89 mg/dL (ref 0–99)
Triglycerides: 79 mg/dL (ref 0–149)
VLDL Cholesterol Cal: 16 mg/dL (ref 5–40)

## 2017-11-20 LAB — VITAMIN D 25 HYDROXY (VIT D DEFICIENCY, FRACTURES): Vit D, 25-Hydroxy: 10.2 ng/mL — ABNORMAL LOW (ref 30.0–100.0)

## 2017-11-20 LAB — MICROSCOPIC EXAMINATION: BACTERIA UA: NONE SEEN

## 2017-11-20 LAB — HEPATITIS C ANTIBODY: Hep C Virus Ab: >11 s/co ratio — ABNORMAL HIGH (ref 0.0–0.9)

## 2017-11-21 ENCOUNTER — Telehealth: Payer: Self-pay | Admitting: Family Medicine

## 2017-11-21 DIAGNOSIS — M4722 Other spondylosis with radiculopathy, cervical region: Secondary | ICD-10-CM

## 2017-11-21 NOTE — Telephone Encounter (Signed)
Please let her know that her arthritis in her neck is really bad, so I'm going to get her an MRI of her neck.   Her labs were normal except her vitamin D is really low- so I'm sending some through to her pharmacy. I also need her to come in for additional blood work either tomorrow or Friday. Thanks!

## 2017-11-21 NOTE — Telephone Encounter (Signed)
Called and left a message for patient to return my call to get her lab results.

## 2017-11-21 NOTE — Telephone Encounter (Signed)
MRI scheduled for next Wednesday 11/28/17 (arrive 1:30). Patient will need a driver because she'll be taking anit-anxiety medication. They will not do the MRI without her having a driver. She's going to [place] for the open MRI.   Dr. Laural Benes will have to prescribe anti-anxiety medication.

## 2017-11-22 ENCOUNTER — Encounter: Payer: Self-pay | Admitting: Family Medicine

## 2017-11-22 ENCOUNTER — Ambulatory Visit (INDEPENDENT_AMBULATORY_CARE_PROVIDER_SITE_OTHER): Payer: Medicare Other | Admitting: Family Medicine

## 2017-11-22 VITALS — BP 104/66 | HR 65 | Temp 98.7°F | Wt 82.0 lb

## 2017-11-22 DIAGNOSIS — Z23 Encounter for immunization: Secondary | ICD-10-CM

## 2017-11-22 DIAGNOSIS — B182 Chronic viral hepatitis C: Secondary | ICD-10-CM | POA: Insufficient documentation

## 2017-11-22 MED ORDER — ALPRAZOLAM 1 MG PO TABS: ORAL_TABLET | ORAL | 0 refills | Status: DC

## 2017-11-22 NOTE — Patient Instructions (Addendum)
MRI appointment 11/28/17 (arrive 1:30).   Pneumococcal Conjugate Vaccine suspension for injection What is this medicine? PNEUMOCOCCAL VACCINE (NEU mo KOK al vak SEEN) is a vaccine used to prevent pneumococcus bacterial infections. These bacteria can cause serious infections like pneumonia, meningitis, and blood infections. This vaccine will lower your chance of getting pneumonia. If you do get pneumonia, it can make your symptoms milder and your illness shorter. This vaccine will not treat an infection and will not cause infection. This vaccine is recommended for infants and young children, adults with certain medical conditions, and adults 65 years or older. This medicine may be used for other purposes; ask your health care provider or pharmacist if you have questions. COMMON BRAND NAME(S): Prevnar, Prevnar 13 What should I tell my health care provider before I take this medicine? They need to know if you have any of these conditions: -bleeding problems -fever -immune system problems -an unusual or allergic reaction to pneumococcal vaccine, diphtheria toxoid, other vaccines, latex, other medicines, foods, dyes, or preservatives -pregnant or trying to get pregnant -breast-feeding How should I use this medicine? This vaccine is for injection into a muscle. It is given by a health care professional. A copy of Vaccine Information Statements will be given before each vaccination. Read this sheet carefully each time. The sheet may change frequently. Talk to your pediatrician regarding the use of this medicine in children. While this drug may be prescribed for children as young as 2 weeks old for selected conditions, precautions do apply. Overdosage: If you think you have taken too much of this medicine contact a poison control center or emergency room at once. NOTE: This medicine is only for you. Do not share this medicine with others. What if I miss a dose? It is important not to miss your dose.  Call your doctor or health care professional if you are unable to keep an appointment. What may interact with this medicine? -medicines for cancer chemotherapy -medicines that suppress your immune function -steroid medicines like prednisone or cortisone This list may not describe all possible interactions. Give your health care provider a list of all the medicines, herbs, non-prescription drugs, or dietary supplements you use. Also tell them if you smoke, drink alcohol, or use illegal drugs. Some items may interact with your medicine. What should I watch for while using this medicine? Mild fever and pain should go away in 3 days or less. Report any unusual symptoms to your doctor or health care professional. What side effects may I notice from receiving this medicine? Side effects that you should report to your doctor or health care professional as soon as possible: -allergic reactions like skin rash, itching or hives, swelling of the face, lips, or tongue -breathing problems -confused -fast or irregular heartbeat -fever over 102 degrees F -seizures -unusual bleeding or bruising -unusual muscle weakness Side effects that usually do not require medical attention (report to your doctor or health care professional if they continue or are bothersome): -aches and pains -diarrhea -fever of 102 degrees F or less -headache -irritable -loss of appetite -pain, tender at site where injected -trouble sleeping This list may not describe all possible side effects. Call your doctor for medical advice about side effects. You may report side effects to FDA at 1-800-FDA-1088. Where should I keep my medicine? This does not apply. This vaccine is given in a clinic, pharmacy, doctor's office, or other health care setting and will not be stored at home. NOTE: This sheet is a summary.  It may not cover all possible information. If you have questions about this medicine, talk to your doctor, pharmacist, or  health care provider.  2018 Elsevier/Gold Standard (2014-08-27 10:27:27)

## 2017-11-22 NOTE — Assessment & Plan Note (Signed)
Newly diagnosed. Checking labs today to see viral load and genotype. Attempted to order RUQ [place] with elastography, however, insurance wouldn't let us order it. Will wait until she sees GI to get this done.

## 2017-11-22 NOTE — Progress Notes (Signed)
BP 104/66 (BP Location: Right Arm, Patient Position: Sitting, Cuff Size: Normal)   Pulse 65   Temp 98.7 F (37.1 C)   Wt 82 lb (37.2 kg)   SpO2 95%   BMI 16.34 kg/m    Subjective:    Patient ID: Vanessa Oneal, female    DOB: Sep 27, 1951, 66 y.o.   MRN: [id number]  HPI: Vanessa Oneal is a 66 y.o. female  Chief Complaint  Patient presents with  . Hepatitis C   HEPATITIS C Duration since diagnosis: Newly diagnosed Hep C transmission: unknown, IVDU Genotype: unknown Viral load:  unknown Hepatology evaluation:no Liver biopsy:no  Cirrhosis: unknown Antiviral therapy:no Hepatocellular carcinoma screening: no Esophageal varices screening/EGD: no Hepatitis A Vaccine: unknown Hepatitis B Vaccine: unknown Pneumovax Vaccine: Prevnar given today- pneumovax next year  Relevant past medical, surgical, family and social history reviewed and updated as indicated. Interim medical history since our last visit reviewed. Allergies and medications reviewed and updated.  Review of Systems  Constitutional: Negative.   Respiratory: Negative.   Cardiovascular: Negative.   Psychiatric/Behavioral: Negative.     Per HPI unless specifically indicated above     Objective:    BP 104/66 (BP Location: Right Arm, Patient Position: Sitting, Cuff Size: Normal)   Pulse 65   Temp 98.7 F (37.1 C)   Wt 82 lb (37.2 kg)   SpO2 95%   BMI 16.34 kg/m   Wt Readings from Last 3 Encounters:  11/22/17 82 lb (37.2 kg)  11/19/17 81 lb 3 oz (36.8 kg)  02/09/16 75 lb (34 kg)    Physical Exam  Constitutional: She is oriented to person, place, and time. She appears well-developed and well-nourished. No distress.  HENT:  Head: Normocephalic and atraumatic.  Right Ear: Hearing normal.  Left Ear: Hearing normal.  Nose: Nose normal.  Eyes: Conjunctivae and lids are normal. Right eye exhibits no discharge. Left eye exhibits no discharge. No scleral icterus.  Cardiovascular: Normal rate, regular  rhythm, normal heart sounds and intact distal pulses. Exam reveals no gallop and no friction rub.  No murmur heard. Pulmonary/Chest: Effort normal and breath sounds normal. No respiratory distress. She has no wheezes. She has no rales. She exhibits no tenderness.  Musculoskeletal: Normal range of motion.  Neurological: She is alert and oriented to person, place, and time.  Skin: Skin is warm, dry and intact. No rash noted. She is not diaphoretic. No erythema. No pallor.  Psychiatric: She has a normal mood and affect. Her speech is normal and behavior is normal. Judgment and thought content normal. Cognition and memory are normal.  Nursing note and vitals reviewed.   Results for orders placed or performed in visit on 11/19/17  Microscopic Examination  Result Value Ref Range   WBC, UA 0-5 0 - 5 /hpf   RBC, UA 0-2 0 - 2 /hpf   Epithelial Cells (non renal) 0-10 0 - 10 /hpf   Renal Epithel, UA 0-10 (A) None seen /hpf   Bacteria, UA None seen None seen/Few  Hepatitis C antibody  Result Value Ref Range   Hep C Virus Ab >11.0 (H) 0.0 - 0.9 s/co ratio  Bayer DCA Hb A1c Waived  Result Value Ref Range   Bayer DCA Hb A1c Waived 4.9 <7.0 %  CBC with Differential/Platelet  Result Value Ref Range   WBC 6.2 3.4 - 10.8 x10E3/uL   RBC 4.11 3.77 - 5.28 x10E6/uL   Hemoglobin 13.3 11.1 - 15.9 g/dL   Hematocrit 86.5 78.4 -  46.6 %   MCV 95 79 - 97 fL   MCH 32.4 26.6 - 33.0 pg   MCHC 34.0 31.5 - 35.7 g/dL   RDW 16.1 09.6 - 04.5 %   Platelets 200 150 - 379 x10E3/uL   Neutrophils 59 Not Estab. %   Lymphs 25 Not Estab. %   Monocytes 7 Not Estab. %   Eos 8 Not Estab. %   Basos 1 Not Estab. %   Neutrophils Absolute 3.7 1.4 - 7.0 x10E3/uL   Lymphocytes Absolute 1.6 0.7 - 3.1 x10E3/uL   Monocytes Absolute 0.4 0.1 - 0.9 x10E3/uL   EOS (ABSOLUTE) 0.5 (H) 0.0 - 0.4 x10E3/uL   Basophils Absolute 0.0 0.0 - 0.2 x10E3/uL   Immature Granulocytes 0 Not Estab. %   Immature Grans (Abs) 0.0 0.0 - 0.1 x10E3/uL    Comprehensive metabolic panel  Result Value Ref Range   Glucose 85 65 - 99 mg/dL   BUN 15 8 - 27 mg/dL   Creatinine, Ser 4.09 0.57 - 1.00 mg/dL   GFR calc non Af Amer 92 >59 mL/min/1.73   GFR calc Af Amer 106 >59 mL/min/1.73   BUN/Creatinine Ratio 23 12 - 28   Sodium 138 134 - 144 mmol/L   Potassium 4.4 3.5 - 5.2 mmol/L   Chloride 99 96 - 106 mmol/L   CO2 24 20 - 29 mmol/L   Calcium 9.2 8.7 - 10.3 mg/dL   Total Protein 7.5 6.0 - 8.5 g/dL   Albumin 4.3 3.6 - 4.8 g/dL   Globulin, Total 3.2 1.5 - 4.5 g/dL   Albumin/Globulin Ratio 1.3 1.2 - 2.2   Bilirubin Total 0.3 0.0 - 1.2 mg/dL   Alkaline Phosphatase 81 39 - 117 IU/L   AST 47 (H) 0 - 40 IU/L   ALT 26 0 - 32 IU/L  Lipid Panel w/o Chol/HDL Ratio  Result Value Ref Range   Cholesterol, Total 201 (H) 100 - 199 mg/dL   Triglycerides 79 0 - 149 mg/dL   HDL 96 >81 mg/dL   VLDL Cholesterol Cal 16 5 - 40 mg/dL   LDL Calculated 89 0 - 99 mg/dL  Thyroid Panel With TSH  Result Value Ref Range   TSH 3.130 0.450 - 4.500 uIU/mL   T4, Total 6.3 4.5 - 12.0 ug/dL   T3 Uptake Ratio 21 (L) 24 - 39 %   Free Thyroxine Index 1.3 1.2 - 4.9  VITAMIN D 25 Hydroxy (Vit-D Deficiency, Fractures)  Result Value Ref Range   Vit D, 25-Hydroxy 10.2 (L) 30.0 - 100.0 ng/mL  UA/M w/rflx Culture, Routine  Result Value Ref Range   Specific Gravity, UA <1.005 (L) 1.005 - 1.030   pH, UA 6.5 5.0 - 7.5   Color, UA Straw Yellow   Appearance Ur Hazy (A) Clear   Leukocytes, UA Trace (A) Negative   Protein, UA Negative Negative/Trace   Glucose, UA Negative Negative   Ketones, UA Negative Negative   RBC, UA Negative Negative   Bilirubin, UA Negative Negative   Urobilinogen, Ur 0.2 0.2 - 1.0 mg/dL   Nitrite, UA Negative Negative   Microscopic Examination See below:   B12 and Folate Panel  Result Value Ref Range   Vitamin B-12 355 232 - 1,245 pg/mL   Folate 7.1 >3.0 ng/mL      Assessment & Plan:   Problem List Items Addressed This Visit      Digestive    Chronic hepatitis C without hepatic coma (HCC) - Primary  Newly diagnosed. Checking labs today to see viral load and genotype. Attempted to order RUQ [place] with elastography, however, insurance wouldn't let us order it. Will wait until she sees GI to get this done.       Relevant Orders   HCV RNA quant   Hepatitis, Acute   Hepatitis B surface antibody   Hepatitis C Genotype   Ambulatory referral to Gastroenterology    Other Visit Diagnoses    Immunization due       Prevnar given today.   Relevant Orders   Pneumococcal conjugate vaccine 13-valent       Follow up plan: Return As scheduled.

## 2017-11-22 NOTE — Telephone Encounter (Signed)
Patient coming in today to discuss blood work. Will give her anti-anxiety for MRI at that time.

## 2017-11-26 ENCOUNTER — Telehealth: Payer: Self-pay | Admitting: Family Medicine

## 2017-11-26 ENCOUNTER — Encounter: Payer: Self-pay | Admitting: Gastroenterology

## 2017-11-26 LAB — HEPATITIS B SURFACE ANTIBODY, QUANTITATIVE: HEPATITIS B SURF AB QUANT: 759.2 m[IU]/mL

## 2017-11-26 LAB — HEPATITIS PANEL, ACUTE
HEP B S AG: NEGATIVE
Hep A IgM: NEGATIVE
Hep B C IgM: NEGATIVE
Hep C Virus Ab: >11 s/co ratio — ABNORMAL HIGH (ref 0.0–0.9)

## 2017-11-26 LAB — HEPATITIS C GENOTYPE

## 2017-11-26 LAB — HCV RNA QUANT
HCV log10: 6.656 log10 IU/mL
HEPATITIS C QUANTITATION: 4530000 [IU]/mL

## 2017-11-26 NOTE — Telephone Encounter (Signed)
Please let her know that she does have the hep C virus in her system, but she is immune to hep B, so she doesn't need a hep B vaccine. We will get her into see the liver doctor though. It looks like they've called her 2x so far- so please give her their number so she can call them back. Thanks!

## 2017-11-26 NOTE — Telephone Encounter (Signed)
Called and spoke with patients niece, she will have the patient return my call.

## 2017-11-28 ENCOUNTER — Ambulatory Visit: Admission: RE | Admit: 2017-11-28 | Payer: Medicaid Other | Source: Ambulatory Visit

## 2017-11-28 NOTE — Telephone Encounter (Signed)
Called patient, went straight to voicemail, unable to leave a message. Will try again.

## 2017-11-29 ENCOUNTER — Other Ambulatory Visit: Payer: Medicare Other

## 2017-11-29 NOTE — Telephone Encounter (Signed)
Patient came by in person. Gave her the result message from Dr. Laural Benes and also provided the patient [facility]'s phone number.

## 2017-12-11 ENCOUNTER — Ambulatory Visit (INDEPENDENT_AMBULATORY_CARE_PROVIDER_SITE_OTHER): Payer: Medicare Other | Admitting: Family Medicine

## 2017-12-11 ENCOUNTER — Encounter: Payer: Self-pay | Admitting: Family Medicine

## 2017-12-11 DIAGNOSIS — F419 Anxiety disorder, unspecified: Secondary | ICD-10-CM | POA: Diagnosis not present

## 2017-12-11 DIAGNOSIS — M19042 Primary osteoarthritis, left hand: Secondary | ICD-10-CM

## 2017-12-11 DIAGNOSIS — F1110 Opioid abuse, uncomplicated: Secondary | ICD-10-CM

## 2017-12-11 DIAGNOSIS — M19041 Primary osteoarthritis, right hand: Secondary | ICD-10-CM | POA: Diagnosis not present

## 2017-12-11 DIAGNOSIS — B182 Chronic viral hepatitis C: Secondary | ICD-10-CM

## 2017-12-11 MED ORDER — DULOXETINE HCL 20 MG PO CPEP: 20.0000 mg | ORAL_CAPSULE | Freq: Every day | ORAL | 3 refills | Status: DC

## 2017-12-11 NOTE — Assessment & Plan Note (Signed)
Follows with suboxone provider. Continue to follow. Call with any concerns.

## 2017-12-11 NOTE — Progress Notes (Signed)
BP 113/74 (BP Location: Left Arm, Patient Position: Sitting, Cuff Size: Normal)   Pulse 71   Temp 98.6 F (37 C)   Wt 84 lb 6 oz (38.3 kg)   SpO2 94%   BMI 16.81 kg/m    Subjective:    Patient ID: Vanessa Oneal, female    DOB: Apr 28, 1951, 67 y.o.   MRN: [id number]  HPI: Vanessa Oneal is a 67 y.o. female  Chief Complaint  Patient presents with  . Depression  . Numbness   Severe arthritis on her x-ray done on 11/19/17. Concern for possible radiculopathy given her symptoms. MRI of her neck ordered, and was scheduled for 11/28/17, but was cancelled due to family issues.  DEPRESSION- She feels like the celexa doesn't agree with her. Doesn't make her feel good. Makes her feel weird.  Mood status: uncontrolled Satisfied with current treatment?: no Symptom severity: moderate  Duration of current treatment : 1 month  Side effects: yes- jitter and not like herself Medication compliance: excellent compliance Psychotherapy/counseling: no  Previous psychiatric medications: celexa, xanax, klonopin, ativan Depressed mood: yes Anxious mood: yes Anhedonia: no Significant weight loss or gain: no Insomnia: yes  Fatigue: yes Feelings of worthlessness or guilt: no Impaired concentration/indecisiveness: no Suicidal ideations: no Hopelessness: no Crying spells: no Depression screen [facility] 2/9 12/11/2017 11/22/2017 11/19/2017  Decreased Interest 1 1 1   Down, Depressed, Hopeless 2 1 1   PHQ - 2 Score 3 2 2   Altered sleeping 1 0 1  Tired, decreased energy 1 1 1   Change in appetite 1 0 0  Feeling bad or failure about yourself  0 0 0  Trouble concentrating 1 1 1   Moving slowly or fidgety/restless 1 0 0  Suicidal thoughts 0 0 0  PHQ-9 Score 8 4 5   Difficult doing work/chores Somewhat difficult Somewhat difficult -   GAD 7 : Generalized Anxiety Score 11/19/2017  Nervous, Anxious, on Edge 3  Control/stop worrying 1  Worry too much - different things 2  Trouble relaxing 1  Restless 1   Easily annoyed or irritable 0  Afraid - awful might happen 1  Total GAD 7 Score 9  Anxiety Difficulty Somewhat difficult   Relevant past medical, surgical, family and social history reviewed and updated as indicated. Interim medical history since our last visit reviewed. Allergies and medications reviewed and updated.  Review of Systems  Constitutional: Negative.   Respiratory: Negative.   Cardiovascular: Negative.   Musculoskeletal: Positive for arthralgias, myalgias, neck pain and neck stiffness. Negative for back pain, gait problem and joint swelling.  Neurological: Positive for numbness. Negative for dizziness, tremors, seizures, syncope, facial asymmetry, speech difficulty, weakness, light-headedness and headaches.  Psychiatric/Behavioral: Positive for agitation and sleep disturbance. Negative for behavioral problems, confusion, decreased concentration, dysphoric mood, hallucinations, self-injury and suicidal ideas. The patient is nervous/anxious. The patient is not hyperactive.     Per HPI unless specifically indicated above     Objective:    BP 113/74 (BP Location: Left Arm, Patient Position: Sitting, Cuff Size: Normal)   Pulse 71   Temp 98.6 F (37 C)   Wt 84 lb 6 oz (38.3 kg)   SpO2 94%   BMI 16.81 kg/m   Wt Readings from Last 3 Encounters:  12/11/17 84 lb 6 oz (38.3 kg)  11/22/17 82 lb (37.2 kg)  11/19/17 81 lb 3 oz (36.8 kg)    Physical Exam  Constitutional: She is oriented to person, place, and time. She appears well-developed and well-nourished. No  distress.  HENT:  Head: Normocephalic and atraumatic.  Right Ear: Hearing normal.  Left Ear: Hearing normal.  Nose: Nose normal.  Eyes: Conjunctivae and lids are normal. Right eye exhibits no discharge. Left eye exhibits no discharge. No scleral icterus.  Cardiovascular: Normal rate, normal heart sounds and intact distal pulses. Exam reveals no gallop and no friction rub.  No murmur heard. Pulmonary/Chest: Effort  normal and breath sounds normal. No respiratory distress. She has no wheezes. She has no rales. She exhibits no tenderness.  Musculoskeletal: Normal range of motion.  Neurological: She is alert and oriented to person, place, and time.  Skin: Skin is warm, dry and intact. No rash noted. She is not diaphoretic. No erythema. No pallor.  Psychiatric: She has a normal mood and affect. Her speech is normal and behavior is normal. Judgment and thought content normal. Cognition and memory are normal.  Nursing note and vitals reviewed.   Results for orders placed or performed in visit on 11/22/17  HCV RNA quant  Result Value Ref Range   Hepatitis C Quantitation 4,530,000 IU/mL   HCV log10 6.656 log10 IU/mL   Test Information Comment   Hepatitis, Acute  Result Value Ref Range   Hep A IgM Negative Negative   Hepatitis B Surface Ag Negative Negative   Hep B C IgM Negative Negative   Hep C Virus Ab >11.0 (H) 0.0 - 0.9 s/co ratio  Hepatitis B surface antibody  Result Value Ref Range   Hepatitis B Surf Ab Quant 759.2 Immunity>9.9 mIU/mL  Hepatitis C Genotype  Result Value Ref Range   Hepatitis C Genotype 2b    Please Note (HCV): Comment       Assessment & Plan:   Problem List Items Addressed This Visit      Digestive   Chronic hepatitis C without hepatic coma (HCC)    Needs to establish with GI- appointment scheduled with Dr. Servando Snare made for 01/15/18- given to patient with phone number.        Musculoskeletal and Integument   Osteoarthritis    Concern for radiculopathy, cancelled MRI previously due to family issues. Will get her rescheduled. Get appointment.         Other   Opiate abuse, continuous (HCC)    Follows with suboxone provider. Continue to follow. Call with any concerns.       Anxiety disorder    Not under good control. Not feeling well on the celexa, will change to cymbalta and recheck 1 month. Call with any concerns.       Relevant Medications   DULoxetine (CYMBALTA)  20 MG capsule       Follow up plan: Return in about 4 weeks (around 01/08/2018) for Follow up mood.

## 2017-12-11 NOTE — Assessment & Plan Note (Signed)
Not under good control. Not feeling well on the celexa, will change to cymbalta and recheck 1 month. Call with any concerns.

## 2017-12-11 NOTE — Assessment & Plan Note (Signed)
Needs to establish with GI- appointment scheduled with Dr. Servando Snare made for 01/15/18- given to patient with phone number.

## 2017-12-11 NOTE — Assessment & Plan Note (Signed)
Concern for radiculopathy, cancelled MRI previously due to family issues. Will get her rescheduled. Get appointment.

## 2017-12-11 NOTE — Patient Instructions (Signed)
Hepatitis Doctor Appointment 01/15/18 @ 1:30 Dr. Parke Poisson [facility] - [facility] [address] [place], [place] [address]  Main: [phone]

## 2017-12-14 ENCOUNTER — Other Ambulatory Visit: Payer: Self-pay

## 2017-12-14 DIAGNOSIS — Z1211 Encounter for screening for malignant neoplasm of colon: Secondary | ICD-10-CM

## 2017-12-17 ENCOUNTER — Telehealth: Payer: Self-pay | Admitting: Gastroenterology

## 2017-12-17 NOTE — Telephone Encounter (Signed)
Patient called and would like to schedule an appointment with Dr Servando Snare. She had an upper endo on 11-08-17 & received letter but had more questions.

## 2017-12-17 NOTE — Telephone Encounter (Signed)
She stated someone called her about her Medicare #.

## 2017-12-17 NOTE — Telephone Encounter (Signed)
Error. Incorrect patient.

## 2017-12-18 ENCOUNTER — Ambulatory Visit: Payer: Medicaid Other

## 2017-12-19 ENCOUNTER — Telehealth: Payer: Self-pay | Admitting: Family Medicine

## 2017-12-19 DIAGNOSIS — M5412 Radiculopathy, cervical region: Secondary | ICD-10-CM

## 2017-12-19 NOTE — Telephone Encounter (Signed)
Pt states please refer to orthopedic since she cannot get the MRI scheduled again.

## 2017-12-19 NOTE — Telephone Encounter (Signed)
Please let patient know that we cannot set this up again. I will refer her to orthopedics and they can get the MRI if they think she needs it.

## 2017-12-19 NOTE — Telephone Encounter (Signed)
Patient notified

## 2017-12-19 NOTE — Telephone Encounter (Signed)
Referral already generated.

## 2017-12-19 NOTE — Telephone Encounter (Signed)
Copied from CRM [id number]. Topic: General - Other >> Dec 19, 2017 10:55 AM Cecelia Byars, RMA wrote: Reason for CRM: Outpatient scheduling has called to notify Dr. Laural Benes this is the third appt for MRI pt has no show to and they cannot reschedule her anymore please notify pt

## 2017-12-27 ENCOUNTER — Other Ambulatory Visit: Payer: Commercial Managed Care - HMO

## 2018-01-08 ENCOUNTER — Ambulatory Visit: Payer: Medicare Other | Admitting: Family Medicine

## 2018-01-11 ENCOUNTER — Ambulatory Visit: Payer: Medicare Other

## 2018-01-15 ENCOUNTER — Ambulatory Visit: Payer: Medicaid Other | Admitting: Gastroenterology

## 2018-01-17 ENCOUNTER — Ambulatory Visit: Admission: RE | Admit: 2018-01-17 | Payer: Medicare Other | Source: Ambulatory Visit | Admitting: Gastroenterology

## 2018-01-17 ENCOUNTER — Encounter: Admission: RE | Payer: Self-pay | Source: Ambulatory Visit

## 2018-01-17 SURGERY — COLONOSCOPY WITH PROPOFOL
Anesthesia: General

## 2018-01-21 ENCOUNTER — Ambulatory Visit: Payer: Medicare Other | Admitting: Family Medicine

## 2018-02-04 ENCOUNTER — Ambulatory Visit (INDEPENDENT_AMBULATORY_CARE_PROVIDER_SITE_OTHER): Payer: Medicare Other | Admitting: Family Medicine

## 2018-02-04 ENCOUNTER — Encounter: Payer: Self-pay | Admitting: Family Medicine

## 2018-02-04 ENCOUNTER — Ambulatory Visit (INDEPENDENT_AMBULATORY_CARE_PROVIDER_SITE_OTHER): Payer: Medicare Other

## 2018-02-04 VITALS — BP 119/72 | HR 71 | Temp 98.3°F | Resp 16 | Ht 59.0 in | Wt 84.8 lb

## 2018-02-04 DIAGNOSIS — F419 Anxiety disorder, unspecified: Secondary | ICD-10-CM | POA: Diagnosis not present

## 2018-02-04 DIAGNOSIS — Z0000 Encounter for general adult medical examination without abnormal findings: Secondary | ICD-10-CM

## 2018-02-04 DIAGNOSIS — Z1211 Encounter for screening for malignant neoplasm of colon: Secondary | ICD-10-CM | POA: Diagnosis not present

## 2018-02-04 MED ORDER — DULOXETINE HCL 30 MG PO CPEP: 30.0000 mg | ORAL_CAPSULE | Freq: Every day | ORAL | 3 refills | Status: DC

## 2018-02-04 NOTE — Patient Instructions (Addendum)
Ms. Dajani , Thank you for taking time to come for your Medicare Wellness Visit. I appreciate your ongoing commitment to your health goals. Please review the following plan we discussed and let me know if I can assist you in the future.   Screening recommendations/referrals: Colonoscopy: someone will call you to schedule this.  Mammogram: Please call [phone] to schedule your mammogram.  Bone Density: Please call [phone] to schedule bone density  Recommended yearly ophthalmology/optometry visit for glaucoma screening and checkup Recommended yearly dental visit for hygiene and checkup  Vaccinations: Influenza vaccine: up to date  Pneumococcal vaccine: pneumovax 23 due 11/22/2018 Tdap vaccine: due, check with your insurance company for coverage  Shingles vaccine: not eligible  Advanced directives: Advance directive discussed with you today. I have provided a copy for you to complete at home and have notarized. Once this is complete please bring a copy in to our office so we can scan it into your chart.  Conditions/risks identified: recommend drinking at least 6-8 glasses of water a day  Smoking cessation discussed- Glenna Fellows, RN will call you to discuss lung cancer screening.   Next appointment: Follow up in one year for your annual wellness exam.    Preventive Care 65 Years and Older, Female Preventive care refers to lifestyle choices and visits with your health care provider that can promote health and wellness. What does preventive care include?  A yearly physical exam. This is also called an annual well check.  Dental exams once or twice a year.  Routine eye exams. Ask your health care provider how often you should have your eyes checked.  Personal lifestyle choices, including:  Daily care of your teeth and gums.  Regular physical activity.  Eating a healthy diet.  Avoiding tobacco and drug use.  Limiting alcohol use.  Practicing safe sex.  Taking  low-dose aspirin every day.  Taking vitamin and mineral supplements as recommended by your health care provider. What happens during an annual well check? The services and screenings done by your health care provider during your annual well check will depend on your age, overall health, lifestyle risk factors, and family history of disease. Counseling  Your health care provider may ask you questions about your:  Alcohol use.  Tobacco use.  Drug use.  Emotional well-being.  Home and relationship well-being.  Sexual activity.  Eating habits.  History of falls.  Memory and ability to understand (cognition).  Work and work Astronomer.  Reproductive health. Screening  You may have the following tests or measurements:  Height, weight, and BMI.  Blood pressure.  Lipid and cholesterol levels. These may be checked every 5 years, or more frequently if you are over 93 years old.  Skin check.  Lung cancer screening. You may have this screening every year starting at age 69 if you have a 30-pack-year history of smoking and currently smoke or have quit within the past 15 years.  Fecal occult blood test (FOBT) of the stool. You may have this test every year starting at age 66.  Flexible sigmoidoscopy or colonoscopy. You may have a sigmoidoscopy every 5 years or a colonoscopy every 10 years starting at age 50.  Hepatitis C blood test.  Hepatitis B blood test.  Sexually transmitted disease (STD) testing.  Diabetes screening. This is done by checking your blood sugar (glucose) after you have not eaten for a while (fasting). You may have this done every 1-3 years.  Bone density scan. This is done to screen for  osteoporosis. You may have this done starting at age 67.  Mammogram. This may be done every 1-2 years. Talk to your health care provider about how often you should have regular mammograms. Talk with your health care provider about your test results, treatment options, and  if necessary, the need for more tests. Vaccines  Your health care provider may recommend certain vaccines, such as:  Influenza vaccine. This is recommended every year.  Tetanus, diphtheria, and acellular pertussis (Tdap, Td) vaccine. You may need a Td booster every 10 years.  Zoster vaccine. You may need this after age 58.  Pneumococcal 13-valent conjugate (PCV13) vaccine. One dose is recommended after age 24.  Pneumococcal polysaccharide (PPSV23) vaccine. One dose is recommended after age 77. Talk to your health care provider about which screenings and vaccines you need and how often you need them. This information is not intended to replace advice given to you by your health care provider. Make sure you discuss any questions you have with your health care provider. Document Released: 12/17/2015 Document Revised: 08/09/2016 Document Reviewed: 09/21/2015 Elsevier Interactive Patient Education  2017 [place] Prevention in the Home Falls can cause injuries. They can happen to people of all ages. There are many things you can do to make your home safe and to help prevent falls. What can I do on the outside of my home?  Regularly fix the edges of walkways and driveways and fix any cracks.  Remove anything that might make you trip as you walk through a door, such as a raised step or threshold.  Trim any bushes or trees on the path to your home.  Use bright outdoor lighting.  Clear any walking paths of anything that might make someone trip, such as rocks or tools.  Regularly check to see if handrails are loose or broken. Make sure that both sides of any steps have handrails.  Any raised decks and porches should have guardrails on the edges.  Have any leaves, snow, or ice cleared regularly.  Use sand or salt on walking paths during winter.  Clean up any spills in your garage right away. This includes oil or grease spills. What can I do in the bathroom?  Use night  lights.  Install grab bars by the toilet and in the tub and shower. Do not use towel bars as grab bars.  Use non-skid mats or decals in the tub or shower.  If you need to sit down in the shower, use a plastic, non-slip stool.  Keep the floor dry. Clean up any water that spills on the floor as soon as it happens.  Remove soap buildup in the tub or shower regularly.  Attach bath mats securely with double-sided non-slip rug tape.  Do not have throw rugs and other things on the floor that can make you trip. What can I do in the bedroom?  Use night lights.  Make sure that you have a light by your bed that is easy to reach.  Do not use any sheets or blankets that are too big for your bed. They should not hang down onto the floor.  Have a firm chair that has side arms. You can use this for support while you get dressed.  Do not have throw rugs and other things on the floor that can make you trip. What can I do in the kitchen?  Clean up any spills right away.  Avoid walking on wet floors.  Keep items that you use  a lot in easy-to-reach places.  If you need to reach something above you, use a strong step stool that has a grab bar.  Keep electrical cords out of the way.  Do not use floor polish or wax that makes floors slippery. If you must use wax, use non-skid floor wax.  Do not have throw rugs and other things on the floor that can make you trip. What can I do with my stairs?  Do not leave any items on the stairs.  Make sure that there are handrails on both sides of the stairs and use them. Fix handrails that are broken or loose. Make sure that handrails are as long as the stairways.  Check any carpeting to make sure that it is firmly attached to the stairs. Fix any carpet that is loose or worn.  Avoid having throw rugs at the top or bottom of the stairs. If you do have throw rugs, attach them to the floor with carpet tape.  Make sure that you have a light switch at the  top of the stairs and the bottom of the stairs. If you do not have them, ask someone to add them for you. What else can I do to help prevent falls?  Wear shoes that:  Do not have high heels.  Have rubber bottoms.  Are comfortable and fit you well.  Are closed at the toe. Do not wear sandals.  If you use a stepladder:  Make sure that it is fully opened. Do not climb a closed stepladder.  Make sure that both sides of the stepladder are locked into place.  Ask someone to hold it for you, if possible.  Clearly mark and make sure that you can see:  Any grab bars or handrails.  First and last steps.  Where the edge of each step is.  Use tools that help you move around (mobility aids) if they are needed. These include:  Canes.  Walkers.  Scooters.  Crutches.  Turn on the lights when you go into a dark area. Replace any light bulbs as soon as they burn out.  Set up your furniture so you have a clear path. Avoid moving your furniture around.  If any of your floors are uneven, fix them.  If there are any pets around you, be aware of where they are.  Review your medicines with your doctor. Some medicines can make you feel dizzy. This can increase your chance of falling. Ask your doctor what other things that you can do to help prevent falls. This information is not intended to replace advice given to you by your health care provider. Make sure you discuss any questions you have with your health care provider. Document Released: 09/16/2009 Document Revised: 04/27/2016 Document Reviewed: 12/25/2014 Elsevier Interactive Patient Education  2017 [employer].  Steps to Quit Smoking Smoking tobacco can be bad for your health. It can also affect almost every organ in your body. Smoking puts you and people around you at risk for many serious long-lasting (chronic) diseases. Quitting smoking is hard, but it is one of the best things that you can do for your health. It is never too  late to quit. What are the benefits of quitting smoking? When you quit smoking, you lower your risk for getting serious diseases and conditions. They can include:  Lung cancer or lung disease.  Heart disease.  Stroke.  Heart attack.  Not being able to have children (infertility).  Weak bones (osteoporosis) and broken  bones (fractures).  If you have coughing, wheezing, and shortness of breath, those symptoms may get better when you quit. You may also get sick less often. If you are pregnant, quitting smoking can help to lower your chances of having a baby of low birth weight. What can I do to help me quit smoking? Talk with your doctor about what can help you quit smoking. Some things you can do (strategies) include:  Quitting smoking totally, instead of slowly cutting back how much you smoke over a period of time.  Going to in-person counseling. You are more likely to quit if you go to many counseling sessions.  Using resources and support systems, such as: ? Agricultural engineer with a Veterinary surgeon. ? Phone quitlines. ? Automotive engineer. ? Support groups or group counseling. ? Text messaging programs. ? Mobile phone apps or applications.  Taking medicines. Some of these medicines may have nicotine in them. If you are pregnant or breastfeeding, do not take any medicines to quit smoking unless your doctor says it is okay. Talk with your doctor about counseling or other things that can help you.  Talk with your doctor about using more than one strategy at the same time, such as taking medicines while you are also going to in-person counseling. This can help make quitting easier. What things can I do to make it easier to quit? Quitting smoking might feel very hard at first, but there is a lot that you can do to make it easier. Take these steps:  Talk to your family and friends. Ask them to support and encourage you.  Call phone quitlines, reach out to support groups, or work with  a Veterinary surgeon.  Ask people who smoke to not smoke around you.  Avoid places that make you want (trigger) to smoke, such as: ? Bars. ? Parties. ? Smoke-break areas at work.  Spend time with people who do not smoke.  Lower the stress in your life. Stress can make you want to smoke. Try these things to help your stress: ? Getting regular exercise. ? Deep-breathing exercises. ? Yoga. ? Meditating. ? Doing a body scan. To do this, close your eyes, focus on one area of your body at a time from head to toe, and notice which parts of your body are tense. Try to relax the muscles in those areas.  Download or buy apps on your mobile phone or tablet that can help you stick to your quit plan. There are many free apps, such as QuitGuide from the [employer] developer for Disease Control and Prevention). You can find more support from smokefree.gov and other websites.  This information is not intended to replace advice given to you by your health care provider. Make sure you discuss any questions you have with your health care provider. Document Released: 09/16/2009 Document Revised: 07/18/2016 Document Reviewed: 04/06/2015 Elsevier Interactive Patient Education  2018 [employer].

## 2018-02-04 NOTE — Assessment & Plan Note (Signed)
Doing a bit better, but not quite there, will increase her cymbalta to 30mg  and recheck in 1-2 months. Call with any concerns.

## 2018-02-04 NOTE — Progress Notes (Signed)
Subjective:   Vanessa Oneal is a 67 y.o. female who presents for an Initial Medicare Annual Wellness Visit.  Review of Systems      Cardiac Risk Factors include: advanced age (>70men, >52 women);smoking/ tobacco exposure;sedentary lifestyle     Objective:    Today's Vitals   02/04/18 1350  BP: 119/72  Pulse: 71  Resp: 16  Temp: 98.3 F (36.8 C)  TempSrc: Temporal  SpO2: 95%  Weight: 84 lb 12.8 oz (38.5 kg)  Height: 4\' 11"  (1.499 m)   Body mass index is 17.13 kg/m.  Advanced Directives 02/04/2018 02/09/2016  Does Patient Have a Medical Advance Directive? No No  Would patient like information on creating a medical advance directive? Yes (MAU/Ambulatory/Procedural Areas - Information given) -    Current Medications (verified) Outpatient Encounter Medications as of 02/04/2018  Medication Sig  . DULoxetine (CYMBALTA) 20 MG capsule Take 1 capsule (20 mg total) by mouth daily.  . SUBOXONE 8-2 MG FILM place ONE FILM UNDER THE TONGUE TWICE DAILY   No facility-administered encounter medications on file as of 02/04/2018.     Allergies (verified) Gabapentin and Gabapentin   History: History reviewed. No pertinent past medical history. Past Surgical History:  Procedure Laterality Date  . BREAST SURGERY     Silicone removal  . TUBAL LIGATION     Family History  Problem Relation Age of Onset  . Suicidality Father   . COPD Sister    Social History   Socioeconomic History  . Marital status: Widowed    Spouse name: None  . Number of children: None  . Years of education: None  . Highest education level: None  Social Needs  . Financial resource strain: Not hard at all  . Food insecurity - worry: Never true  . Food insecurity - inability: Never true  . Transportation needs - medical: No  . Transportation needs - non-medical: No  Occupational History  . None  Tobacco Use  . Smoking status: Current Every Day Smoker    Packs/day: 0.50  . Smokeless tobacco: Never Used   Substance and Sexual Activity  . Alcohol use: Yes    Alcohol/week: 1.2 oz    Types: 2 Cans of beer per week    Comment: couple of beers a week   . Drug use: No  . Sexual activity: Not Currently  Other Topics Concern  . None  Social History Narrative   ** Merged History Encounter **        Tobacco Counseling Ready to quit: Yes Counseling given: Yes   Clinical Intake:  Pre-visit preparation completed: Yes  Pain : No/denies pain     Nutritional Status: BMI <19  Underweight Nutritional Risks: None Diabetes: No  How often do you need to have someone help you when you read instructions, pamphlets, or other written materials from your doctor or pharmacy?: 4 - Often What is the last grade level you completed in school?: 8th grade  Interpreter Needed?: No  Information entered by :: Tiffany Hill,LPN   Activities of Daily Living In your present state of health, do you have any difficulty performing the following activities: 02/04/2018 11/19/2017  Hearing? N Y  Vision? Y Y  Difficulty concentrating or making decisions? Malvin Johns  Walking or climbing stairs? N N  Dressing or bathing? N N  Doing errands, shopping? N N  Preparing Food and eating ? N -  Using the Toilet? N -  In the past six months, have you accidently  leaked urine? N -  Do you have problems with loss of bowel control? N -  Managing your Medications? N -  Managing your Finances? N -  Housekeeping or managing your Housekeeping? N -  Some recent data might be hidden     Immunizations and Health Maintenance Immunization History  Administered Date(s) Administered  . Influenza, High Dose Seasonal PF 11/19/2017  . Influenza, Seasonal, Injecte, Preservative Fre 10/19/2006  . Influenza,inj,Quad PF,6+ Mos 09/18/2012, 08/06/2013, 09/03/2015  . Pneumococcal Conjugate-13 11/22/2017   Health Maintenance Due  Topic Date Due  . MAMMOGRAM  10/27/2001  . COLONOSCOPY  10/27/2001  . DEXA SCAN  10/27/2016    Patient  Care Team: Dorcas Carrow, DO as PCP - General (Family Medicine) System, Pcp Not In  Indicate any recent Medical Services you may have received from other than [facility] providers in the past year (date may be approximate).     Assessment:   This is a routine wellness examination for Vanessa Oneal.  Hearing/Vision screen Vision Screening Comments: Doesn't have an eye doctor currently.  Dietary issues and exercise activities discussed: Current Exercise Habits: The patient does not participate in regular exercise at present, Exercise limited by: None identified  Goals    . DIET - INCREASE WATER INTAKE     Recommend drinking at least 6-8 glasses of water a day       Depression Screen PHQ 2/9 Scores 02/04/2018 12/11/2017 11/22/2017 11/19/2017  PHQ - 2 Score 2 3 2 2   PHQ- 9 Score 4 8 4 5     Fall Risk Fall Risk  02/04/2018 11/22/2017  Falls in the past year? No No    Is the patient's home free of loose throw rugs in walkways, pet beds, electrical cords, etc?   no      Grab bars in the bathroom? yes      Handrails on the stairs?   yes      Adequate lighting?   yes  Timed Get Up and Go Performed Completed in 8 seconds with no use of assistive devices, steady gait. No intervention needed at this time.   Cognitive Function:     6CIT Screen 02/04/2018  What Year? 0 points  What month? 0 points  What time? 0 points  Count back from 20 0 points  Months in reverse 0 points  Repeat phrase 0 points  Total Score 0    Screening Tests Health Maintenance  Topic Date Due  . MAMMOGRAM  10/27/2001  . COLONOSCOPY  10/27/2001  . DEXA SCAN  10/27/2016  . TETANUS/TDAP  02/05/2019 (Originally 10/27/1970)  . PNA vac Low Risk Adult (2 of 2 - PPSV23) 11/22/2018  . INFLUENZA VACCINE  Completed  . Hepatitis C Screening  Completed    Qualifies for Shingles Vaccine? No, patient denies having chicken pox.  Cancer Screenings: Lung: Low Dose CT Chest recommended if Age 4-80 years, 30 pack-year currently  smoking OR have quit w/in 15years. Patient does qualify. Message sent to 10/29/1970, RN- Oncology navigator  Breast: Up to date on Mammogram? No  Order in system, discussed with patient Up to date of Bone Density/Dexa? No order in system, discussed with patient  Colorectal: referral placed to GI  Additional Screenings:  Hepatitis B/HIV/Syphillis: not indicated Hepatitis C Screening: completed 12/11/2017     Plan:    I have personally reviewed and addressed the Medicare Annual Wellness questionnaire and have noted the following in the patient's chart:  A. Medical and social history B. Use  of alcohol, tobacco or illicit drugs  C. Current medications and supplements D. Functional ability and status E.  Nutritional status F.  Physical activity G. Advance directives H. List of other physicians I.  Hospitalizations, surgeries, and ER visits in previous 12 months J.  Vitals K. Screenings such as hearing and vision if needed, cognitive and depression L. Referrals and appointments   In addition, I have reviewed and discussed with patient certain preventive protocols, quality metrics, and best practice recommendations. A written personalized care plan for preventive services as well as general preventive health recommendations were provided to patient.   Signed,  Marin Roberts, LPN Nurse Health Advisor   Nurse Notes:none

## 2018-02-04 NOTE — Progress Notes (Signed)
BP 119/72   Pulse 71   Temp 98.3 F (36.8 C) (Temporal)   Resp 16   Wt 84 lb 12.8 oz (38.5 kg)   SpO2 95%   BMI 17.13 kg/m    Subjective:    Patient ID: Vanessa Oneal, female    DOB: [birth year], 67 y.o.   MRN: [id number]  HPI: Vanessa Oneal is a 67 y.o. female  Chief Complaint  Patient presents with  . Anxiety   ANXIETY/STRESS Duration:better Anxious mood: yes  Excessive worrying: no Irritability: yes  Sweating: no Nausea: no Palpitations:no Hyperventilation: no Panic attacks: no Agoraphobia: no  Obscessions/compulsions: no Depressed mood: yes Depression screen [facility] 2/9 02/04/2018 12/11/2017 11/22/2017 11/19/2017  Decreased Interest 1 1 1 1   Down, Depressed, Hopeless 1 2 1 1   PHQ - 2 Score 2 3 2 2   Altered sleeping 0 1 0 1  Tired, decreased energy 0 1 1 1   Change in appetite 0 1 0 0  Feeling bad or failure about yourself  0 0 0 0  Trouble concentrating 2 1 1 1   Moving slowly or fidgety/restless 0 1 0 0  Suicidal thoughts 0 0 0 0  PHQ-9 Score 4 8 4 5   Difficult doing work/chores Somewhat difficult Somewhat difficult Somewhat difficult -   GAD 7 : Generalized Anxiety Score 02/04/2018 11/19/2017  Nervous, Anxious, on Edge 1 3  Control/stop worrying 1 1  Worry too much - different things 1 2  Trouble relaxing 0 1  Restless 1 1  Easily annoyed or irritable 0 0  Afraid - awful might happen 1 1  Total GAD 7 Score 5 9  Anxiety Difficulty Somewhat difficult Somewhat difficult   Anhedonia: no Weight changes: no Insomnia: no   Hypersomnia: no Fatigue/loss of energy: yes Feelings of worthlessness: no Feelings of guilt: no Impaired concentration/indecisiveness: no Suicidal ideations: no  Crying spells: no Recent Stressors/Life Changes: no   Relationship problems: no   Family stress: no     Financial stress: no    Job stress: no    Recent death/loss: no  Relevant past medical, surgical, family and social history reviewed and updated as indicated.  Interim medical history since our last visit reviewed. Allergies and medications reviewed and updated.  Review of Systems  Constitutional: Negative.   Respiratory: Negative.   Cardiovascular: Negative.   Psychiatric/Behavioral: Positive for dysphoric mood. Negative for agitation, behavioral problems, confusion, decreased concentration, hallucinations, self-injury, sleep disturbance and suicidal ideas. The patient is nervous/anxious. The patient is not hyperactive.     Per HPI unless specifically indicated above     Objective:    BP 119/72   Pulse 71   Temp 98.3 F (36.8 C) (Temporal)   Resp 16   Wt 84 lb 12.8 oz (38.5 kg)   SpO2 95%   BMI 17.13 kg/m   Wt Readings from Last 3 Encounters:  02/04/18 84 lb 12.8 oz (38.5 kg)  02/04/18 84 lb 12.8 oz (38.5 kg)  12/11/17 84 lb 6 oz (38.3 kg)    Physical Exam  Constitutional: She is oriented to person, place, and time. She appears well-developed and well-nourished. No distress.  HENT:  Head: Normocephalic and atraumatic.  Right Ear: Hearing normal.  Left Ear: Hearing normal.  Nose: Nose normal.  Eyes: Conjunctivae and lids are normal. Right eye exhibits no discharge. Left eye exhibits no discharge. No scleral icterus.  Cardiovascular: Normal rate, regular rhythm, normal heart sounds and intact distal pulses. Exam reveals no gallop and  no friction rub.  No murmur heard. Pulmonary/Chest: Effort normal and breath sounds normal. No respiratory distress. She has no wheezes. She has no rales. She exhibits no tenderness.  Musculoskeletal: Normal range of motion.  Neurological: She is alert and oriented to person, place, and time.  Skin: Skin is warm, dry and intact. No rash noted. She is not diaphoretic. No erythema. No pallor.  Psychiatric: She has a normal mood and affect. Her speech is normal and behavior is normal. Judgment and thought content normal. Cognition and memory are normal.  Nursing note and vitals reviewed.   Results for  orders placed or performed in visit on 11/22/17  HCV RNA quant  Result Value Ref Range   Hepatitis C Quantitation 4,530,000 IU/mL   HCV log10 6.656 log10 IU/mL   Test Information Comment   Hepatitis, Acute  Result Value Ref Range   Hep A IgM Negative Negative   Hepatitis B Surface Ag Negative Negative   Hep B C IgM Negative Negative   Hep C Virus Ab >11.0 (H) 0.0 - 0.9 s/co ratio  Hepatitis B surface antibody  Result Value Ref Range   Hepatitis B Surf Ab Quant 759.2 Immunity>9.9 mIU/mL  Hepatitis C Genotype  Result Value Ref Range   Hepatitis C Genotype 2b    Please Note (HCV): Comment       Assessment & Plan:   Problem List Items Addressed This Visit      Other   Anxiety disorder    Doing a bit better, but not quite there, will increase her cymbalta to 30mg  and recheck in 1-2 months. Call with any concerns.       Relevant Medications   DULoxetine (CYMBALTA) 30 MG capsule       Follow up plan: Return 1-2 months , for follow up.

## 2018-02-05 ENCOUNTER — Telehealth: Payer: Self-pay | Admitting: *Deleted

## 2018-02-05 DIAGNOSIS — Z122 Encounter for screening for malignant neoplasm of respiratory organs: Secondary | ICD-10-CM

## 2018-02-05 DIAGNOSIS — Z87891 Personal history of nicotine dependence: Secondary | ICD-10-CM

## 2018-02-05 NOTE — Telephone Encounter (Signed)
Received referral for initial lung cancer screening scan. Contacted patient and obtained smoking history,(current, 84 pack year) as well as answering questions related to screening process. Patient denies signs of lung cancer such as weight loss or hemoptysis. Patient denies comorbidity that would prevent curative treatment if lung cancer were found. Patient is scheduled for shared decision making visit and CT scan on 02/19/18.

## 2018-02-19 ENCOUNTER — Ambulatory Visit: Payer: Medicaid Other | Admitting: Gastroenterology

## 2018-02-21 ENCOUNTER — Encounter: Payer: Self-pay | Admitting: Nurse Practitioner

## 2018-02-21 ENCOUNTER — Inpatient Hospital Stay: Payer: Medicare Other | Attending: Nurse Practitioner | Admitting: Nurse Practitioner

## 2018-02-21 ENCOUNTER — Ambulatory Visit
Admission: RE | Admit: 2018-02-21 | Discharge: 2018-02-21 | Disposition: A | Discharge status: Home / Self Care | Payer: Medicare Other | Source: Ambulatory Visit | Attending: Oncology | Admitting: Oncology

## 2018-02-21 DIAGNOSIS — Z87891 Personal history of nicotine dependence: Secondary | ICD-10-CM

## 2018-02-21 DIAGNOSIS — J439 Emphysema, unspecified: Secondary | ICD-10-CM | POA: Diagnosis not present

## 2018-02-21 DIAGNOSIS — I700 Atherosclerosis of aorta: Secondary | ICD-10-CM | POA: Diagnosis not present

## 2018-02-21 DIAGNOSIS — Z122 Encounter for screening for malignant neoplasm of respiratory organs: Secondary | ICD-10-CM

## 2018-02-21 NOTE — Progress Notes (Signed)
In accordance with CMS guidelines, patient has met eligibility criteria including age, absence of signs or symptoms of lung cancer.  Social History   Tobacco Use  . Smoking status: Current Every Day Smoker    Packs/day: 1.50    Years: 56.00    Pack years: 84.00  . Smokeless tobacco: Never Used  Substance Use Topics  . Alcohol use: Yes    Alcohol/week: 1.2 oz    Types: 2 Cans of beer per week    Comment: couple of beers a week   . Drug use: No     A shared decision-making session was conducted prior to the performance of CT scan. This includes one or more decision aids, includes benefits and harms of screening, follow-up diagnostic testing, over-diagnosis, false positive rate, and total radiation exposure.  Counseling on the importance of adherence to annual lung cancer LDCT screening, impact of co-morbidities, and ability or willingness to undergo diagnosis and treatment is imperative for compliance of the program.  Counseling on the importance of continued smoking cessation for former smokers; the importance of smoking cessation for current smokers, and information about tobacco cessation interventions have been given to patient including [place] and [facility] quit [place] programs.  Written order for lung cancer screening with LDCT has been given to the patient and any and all questions have been answered to the best of my abilities.   Yearly follow up will be coordinated by Burgess Estelle, Thoracic Navigator.  Beckey Rutter, DNP, AGNP-C [facility] [phone] (office) 02/21/18 5:40 PM

## 2018-02-25 ENCOUNTER — Encounter: Payer: Self-pay | Admitting: *Deleted

## 2018-02-28 ENCOUNTER — Encounter: Payer: Self-pay | Admitting: Family Medicine

## 2018-02-28 DIAGNOSIS — J439 Emphysema, unspecified: Secondary | ICD-10-CM | POA: Insufficient documentation

## 2018-02-28 DIAGNOSIS — J432 Centrilobular emphysema: Secondary | ICD-10-CM | POA: Insufficient documentation

## 2018-02-28 DIAGNOSIS — I2510 Atherosclerotic heart disease of native coronary artery without angina pectoris: Secondary | ICD-10-CM | POA: Insufficient documentation

## 2018-04-08 ENCOUNTER — Ambulatory Visit: Payer: Medicare Other | Admitting: Family Medicine

## 2018-04-09 ENCOUNTER — Ambulatory Visit: Payer: Medicare Other | Admitting: Family Medicine

## 2018-04-11 ENCOUNTER — Other Ambulatory Visit: Payer: Self-pay

## 2018-04-11 ENCOUNTER — Ambulatory Visit: Payer: Medicaid Other | Admitting: Gastroenterology

## 2018-04-11 NOTE — Progress Notes (Deleted)
Gastroenterology Consultation  Referring Provider:     Dorcas Carrow, DO Primary Care Physician:  Dorcas Carrow, DO Primary Gastroenterologist:  Dr. Servando Snare     Reason for Consultation:     Hepatitis C        HPI:   CANDISS GALEANA is a 67 y.o. y/o female referred for consultation & management of hepatitis C by Dr. Laural Benes, Megan P, DO.  This patient comes in today after being newly diagnosed with hepatitis C in December 2018.  The patient was found to have hepatitis C genotype 2B.  The patient's liver enzymes were elevated in 2015 but were normal in 2017.  The patient's most recent lab work showed her to have an isolated AST elevation at 47.  The patient's viral load was found to be 4,500,000 international units/mL.  The patient has been found to be immune to hepatitis B virus but there is no labs confirming immunity to hepatitis A.  No past medical history on file.  Past Surgical History:  Procedure Laterality Date  . BREAST SURGERY     Silicone removal  . TUBAL LIGATION      Prior to Admission medications   Medication Sig Start Date End Date Taking? Authorizing Provider  DULoxetine (CYMBALTA) 30 MG capsule Take 1 capsule (30 mg total) by mouth daily. 02/04/18   Johnson, Megan P, DO  SUBOXONE 8-2 MG FILM place ONE FILM UNDER THE TONGUE TWICE DAILY 10/31/17   [provider]    Family History  Problem Relation Age of Onset  . Suicidality Father   . COPD Sister      Social History   Tobacco Use  . Smoking status: Current Every Day Smoker    Packs/day: 1.50    Years: 56.00    Pack years: 84.00  . Smokeless tobacco: Never Used  Substance Use Topics  . Alcohol use: Yes    Alcohol/week: 1.2 oz    Types: 2 Cans of beer per week    Comment: couple of beers a week   . Drug use: No    Allergies as of 04/11/2018 - Review Complete 02/04/2018  Allergen Reaction Noted  . Gabapentin Anaphylaxis 11/19/2017  . Gabapentin Other (See Comments) 02/09/2016    Review  of Systems:    All systems reviewed and negative except where noted in HPI.   Physical Exam:  There were no vitals taken for this visit. No LMP recorded. Patient is postmenopausal. Psych:  Alert and cooperative. Normal mood and affect. General:   Alert,  Well-developed, ***well-nourished, pleasant and cooperative in NAD Head:  Normocephalic and atraumatic. Eyes:  Sclera clear, no icterus.   Conjunctiva pink. Ears:  Normal auditory acuity. Nose:  No deformity, discharge, or lesions. Mouth:  No deformity or lesions,oropharynx pink & moist. Neck:  Supple; no masses or thyromegaly. Lungs:  Respirations even and unlabored.  Clear throughout to auscultation.   No wheezes, crackles, or rhonchi. No acute distress. Heart:  Regular rate and rhythm; no murmurs, clicks, rubs, or gallops. Abdomen:  Normal bowel sounds.  No bruits.  Soft, non-tender and non-distended without masses, hepatosplenomegaly or hernias noted.  No guarding or rebound tenderness.  ***Negative Carnett sign.   Rectal:  Deferred.***  Msk:  Symmetrical without gross deformities.  ***Good, equal movement & strength bilaterally. Pulses:  Normal pulses noted. Extremities:  No clubbing or edema.  No cyanosis. Neurologic:  Alert and oriented x3;  grossly normal neurologically. Skin:  Intact without significant lesions or  rashes.  ***No jaundice. Lymph Nodes:  No significant cervical adenopathy. Psych:  Alert and cooperative. Normal mood and affect.  Imaging Studies: No results found.  Assessment and Plan:   PAYSLEY POPLAR is a 67 y.o. y/o female ***  Midge Minium, MD. Clementeen Graham   Note: This dictation was prepared with Dragon dictation along with smaller phrase technology. Any transcriptional errors that result from this process are unintentional.

## 2018-06-03 ENCOUNTER — Ambulatory Visit: Payer: Medicaid Other | Admitting: Gastroenterology

## 2018-06-03 ENCOUNTER — Encounter: Payer: Self-pay | Admitting: *Deleted

## 2018-12-26 ENCOUNTER — Telehealth: Payer: Self-pay | Admitting: Family Medicine

## 2018-12-26 NOTE — Telephone Encounter (Unsigned)
Copied from CRM [id number]. Topic: Medicare AWV >> Dec 26, 2018  1:22 PM Earlyne Ibaollins, Lisa E wrote: Called to Reschedule Medicare Annual Wellness Visit with the Nurse Health Advisor. No answer and no machine to leave a message.The original date is 02/06/2019 at 1:45 pm.  The appointment schedule has been adjusted.  If patient returns call, please note: their last AWV was on 03/04/209,please schedule AWV with NHA any date on 02/05/2019 at a different time, or at a later date and time.  Thank you! For any questions please contact: Trixie RudeLisa Collins at [phone] or Skype [email or web address]

## 2019-01-10 NOTE — Telephone Encounter (Unsigned)
Copied from CRM [id number]. Topic: Medicare AWV >> Jan 10, 2019  1:02 PM Earlyne Iba wrote: Called to REschedule Medicare Annual Wellness Visit with the Nurse Health Advisor.  Original appointment is 02/06/2019 at 1:45 PM.  No answer at home number and I also tried both the patient contacts (Lenda Joanne Chars and [employer] no answer.  No machine was available at all 3 numbers.  Couldn't leave a message.    If patient returns call, please note: their last AWV was on 02/04/2018 please schedule AWV with NHA any date AFTER 02/05/2019.    Thank you! For any questions please contact: Trixie Rude at [phone] or Skype [email or web address]

## 2019-02-05 ENCOUNTER — Encounter: Payer: Self-pay | Admitting: *Deleted

## 2019-02-06 ENCOUNTER — Ambulatory Visit: Payer: Self-pay

## 2019-02-07 ENCOUNTER — Telehealth: Payer: Self-pay

## 2019-02-07 NOTE — Telephone Encounter (Signed)
Call pt regarding lung screening. Left message for pt to return call.  

## 2019-02-07 NOTE — Telephone Encounter (Signed)
Vanessa Oneal.[name] [phone] disconnected and [phone] no voice mail.

## 2019-02-07 NOTE — Telephone Encounter (Signed)
Call pt regarding lung screening. Unable to reach pt. [phone] has no vmx on it at this time. [phone] has been disconnected.

## 2019-02-15 ENCOUNTER — Telehealth: Payer: Self-pay

## 2019-02-15 NOTE — Telephone Encounter (Signed)
Call pt regarding lung screening. Unable to leave vmx message. [phone] has no vmx box, [phone] has been disconnected.

## 2019-02-17 ENCOUNTER — Encounter: Payer: Self-pay | Admitting: *Deleted

## 2019-02-20 ENCOUNTER — Encounter: Payer: Self-pay | Admitting: *Deleted

## 2019-02-25 ENCOUNTER — Encounter: Admission: RE | Payer: Self-pay | Source: Home / Self Care

## 2019-02-25 ENCOUNTER — Ambulatory Visit: Admission: RE | Admit: 2019-02-25 | Payer: Medicare Other | Source: Home / Self Care | Admitting: Ophthalmology

## 2019-02-25 HISTORY — DX: Depression, unspecified: F32.A

## 2019-02-25 HISTORY — DX: Major depressive disorder, single episode, unspecified: F32.9

## 2019-02-25 HISTORY — DX: Fibromyalgia: M79.7

## 2019-02-25 HISTORY — DX: Unspecified osteoarthritis, unspecified site: M19.90

## 2019-02-25 SURGERY — PHACOEMULSIFICATION, CATARACT, WITH IOL INSERTION
Anesthesia: Topical | Laterality: Left

## 2019-04-22 ENCOUNTER — Telehealth: Payer: Self-pay | Admitting: *Deleted

## 2019-04-22 DIAGNOSIS — Z122 Encounter for screening for malignant neoplasm of respiratory organs: Secondary | ICD-10-CM

## 2019-04-22 DIAGNOSIS — Z87891 Personal history of nicotine dependence: Secondary | ICD-10-CM

## 2019-04-22 NOTE — Telephone Encounter (Signed)
Patient has been notified that annual lung cancer screening low dose CT scan is due currently or will be in near future. Confirmed that patient is within the age range of 55-77, and asymptomatic, (no signs or symptoms of lung cancer). Patient denies illness that would prevent curative treatment for lung cancer if found. Verified smoking history, (current, 84.5 pack year). The shared decision making visit was done 02/21/18. Patient is agreeable for CT scan being scheduled.

## 2019-04-29 ENCOUNTER — Ambulatory Visit: Admission: RE | Admit: 2019-04-29 | Payer: Medicare Other | Source: Ambulatory Visit

## 2019-05-01 ENCOUNTER — Encounter: Payer: Self-pay | Admitting: *Deleted

## 2019-05-01 ENCOUNTER — Other Ambulatory Visit: Payer: Self-pay

## 2019-05-01 NOTE — Discharge Instructions (Signed)

## 2019-05-02 ENCOUNTER — Other Ambulatory Visit
Admission: RE | Admit: 2019-05-02 | Discharge: 2019-05-02 | Disposition: A | Discharge status: Home / Self Care | Payer: Medicare Other | Source: Ambulatory Visit | Attending: Ophthalmology | Admitting: Ophthalmology

## 2019-05-02 DIAGNOSIS — Z1159 Encounter for screening for other viral diseases: Secondary | ICD-10-CM | POA: Insufficient documentation

## 2019-05-03 LAB — NOVEL CORONAVIRUS, NAA (HOSP ORDER, SEND-OUT TO REF LAB; TAT 18-24 HRS): SARS-CoV-2, NAA: NOT DETECTED

## 2019-05-06 ENCOUNTER — Ambulatory Visit: Payer: Medicare Other | Admitting: Anesthesiology

## 2019-05-06 ENCOUNTER — Ambulatory Visit
Admission: RE | Admit: 2019-05-06 | Discharge: 2019-05-06 | Disposition: A | Discharge status: Home / Self Care | Payer: Medicare Other | Attending: Ophthalmology | Admitting: Ophthalmology

## 2019-05-06 ENCOUNTER — Encounter
Admission: RE | Disposition: A | Discharge status: Home / Self Care | Payer: Self-pay | Source: Home / Self Care | Attending: Ophthalmology

## 2019-05-06 ENCOUNTER — Other Ambulatory Visit: Payer: Self-pay

## 2019-05-06 DIAGNOSIS — F17200 Nicotine dependence, unspecified, uncomplicated: Secondary | ICD-10-CM | POA: Diagnosis not present

## 2019-05-06 DIAGNOSIS — H2512 Age-related nuclear cataract, left eye: Secondary | ICD-10-CM | POA: Insufficient documentation

## 2019-05-06 DIAGNOSIS — J449 Chronic obstructive pulmonary disease, unspecified: Secondary | ICD-10-CM | POA: Insufficient documentation

## 2019-05-06 DIAGNOSIS — R52 Pain, unspecified: Secondary | ICD-10-CM | POA: Insufficient documentation

## 2019-05-06 DIAGNOSIS — B1920 Unspecified viral hepatitis C without hepatic coma: Secondary | ICD-10-CM | POA: Insufficient documentation

## 2019-05-06 DIAGNOSIS — M797 Fibromyalgia: Secondary | ICD-10-CM | POA: Insufficient documentation

## 2019-05-06 DIAGNOSIS — I2510 Atherosclerotic heart disease of native coronary artery without angina pectoris: Secondary | ICD-10-CM | POA: Diagnosis not present

## 2019-05-06 DIAGNOSIS — F1120 Opioid dependence, uncomplicated: Secondary | ICD-10-CM | POA: Diagnosis not present

## 2019-05-06 DIAGNOSIS — F329 Major depressive disorder, single episode, unspecified: Secondary | ICD-10-CM | POA: Insufficient documentation

## 2019-05-06 DIAGNOSIS — F419 Anxiety disorder, unspecified: Secondary | ICD-10-CM | POA: Diagnosis not present

## 2019-05-06 DIAGNOSIS — M069 Rheumatoid arthritis, unspecified: Secondary | ICD-10-CM | POA: Insufficient documentation

## 2019-05-06 HISTORY — DX: Presence of dental prosthetic device (complete) (partial): Z97.2

## 2019-05-06 HISTORY — DX: Gastro-esophageal reflux disease without esophagitis: K21.9

## 2019-05-06 HISTORY — PX: CATARACT EXTRACTION W/PHACO: SHX586

## 2019-05-06 HISTORY — DX: Other psychoactive substance dependence, in remission: F19.21

## 2019-05-06 SURGERY — PHACOEMULSIFICATION, CATARACT, WITH IOL INSERTION
Anesthesia: Monitor Anesthesia Care | Site: Eye | Laterality: Left

## 2019-05-06 MED ORDER — MIDAZOLAM HCL 2 MG/2ML IJ SOLN
INTRAMUSCULAR | Status: DC | PRN
  Administered 2019-05-06 (×2): 1 mg via INTRAVENOUS

## 2019-05-06 MED ORDER — ARMC OPHTHALMIC DILATING DROPS
1.0000 "application " | OPHTHALMIC | Status: AC
  Administered 2019-05-06 (×3): 1 via OPHTHALMIC

## 2019-05-06 MED ORDER — MOXIFLOXACIN HCL 0.5 % OP SOLN: 1.0000 [drp] | OPHTHALMIC | Status: DC | PRN

## 2019-05-06 MED ORDER — LACTATED RINGERS IV SOLN: 10.0000 mL/h | INTRAVENOUS | Status: DC

## 2019-05-06 MED ORDER — BRIMONIDINE TARTRATE-TIMOLOL 0.2-0.5 % OP SOLN
OPHTHALMIC | Status: DC | PRN
  Administered 2019-05-06: 1 [drp] via OPHTHALMIC

## 2019-05-06 MED ORDER — EPINEPHRINE PF 1 MG/ML IJ SOLN
INTRAOCULAR | Status: DC | PRN
  Administered 2019-05-06: 72 mL via OPHTHALMIC

## 2019-05-06 MED ORDER — TETRACAINE HCL 0.5 % OP SOLN
1.0000 [drp] | OPHTHALMIC | Status: AC | PRN
  Administered 2019-05-06 (×3): 1 [drp] via OPHTHALMIC

## 2019-05-06 MED ORDER — FENTANYL CITRATE (PF) 100 MCG/2ML IJ SOLN
INTRAMUSCULAR | Status: DC | PRN
  Administered 2019-05-06: 50 ug via INTRAVENOUS

## 2019-05-06 MED ORDER — NA CHONDROIT SULF-NA HYALURON 40-17 MG/ML IO SOLN
INTRAOCULAR | Status: DC | PRN
  Administered 2019-05-06: 1 mL via INTRAOCULAR

## 2019-05-06 MED ORDER — SODIUM CHLORIDE 0.9 % IV SOLN: INTRAVENOUS | Status: DC

## 2019-05-06 MED ORDER — LIDOCAINE HCL (PF) 2 % IJ SOLN
INTRAOCULAR | Status: DC | PRN
  Administered 2019-05-06: 10:00:00 1 mL

## 2019-05-06 MED ORDER — MOXIFLOXACIN HCL 0.5 % OP SOLN
OPHTHALMIC | Status: DC | PRN
  Administered 2019-05-06: 0.2 mL via OPHTHALMIC

## 2019-05-06 SURGICAL SUPPLY — 19 items
CANNULA ANT/CHMB 27GA (MISCELLANEOUS) ×2 IMPLANT
GLOVE SURG LX 8.0 MICRO (GLOVE) ×2
GLOVE SURG LX STRL 8.0 MICRO (GLOVE) ×2 IMPLANT
GLOVE SURG TRIUMPH 8.0 PF LTX (GLOVE) ×2 IMPLANT
GOWN STRL REUS W/ TWL LRG LVL3 (GOWN DISPOSABLE) ×2 IMPLANT
GOWN STRL REUS W/TWL LRG LVL3 (GOWN DISPOSABLE) ×2
LENS IOL TECNIS ITEC 22.5 (Intraocular Lens) ×2 IMPLANT
MARKER SKIN DUAL TIP RULER LAB (MISCELLANEOUS) ×2 IMPLANT
NDL RETROBULBAR .5 NSTRL (NEEDLE) ×2 IMPLANT
NEEDLE FILTER BLUNT 18X 1/2SAF (NEEDLE) ×1
NEEDLE FILTER BLUNT 18X1 1/2 (NEEDLE) ×1 IMPLANT
PACK CATARACT BRASINGTON (MISCELLANEOUS) ×2 IMPLANT
PACK EYE AFTER SURG (MISCELLANEOUS) ×2 IMPLANT
PACK OPTHALMIC (MISCELLANEOUS) ×2 IMPLANT
SUT ETHILON 10-0 CS-B-6CS-B-6 (SUTURE)
SUTURE EHLN 10-0 CS-B-6CS-B-6 (SUTURE) IMPLANT
SYR 3ML LL SCALE MARK (SYRINGE) ×2 IMPLANT
SYR TB 1ML LUER SLIP (SYRINGE) ×2 IMPLANT
WIPE NON LINTING 3.25X3.25 (MISCELLANEOUS) ×2 IMPLANT

## 2019-05-06 NOTE — H&P (Signed)
All labs reviewed. Abnormal studies sent to patients PCP when indicated.  Previous H&P reviewed, patient examined, there are NO CHANGES.  Vanessa Kachel Porfilio6/2/20209:22 AM

## 2019-05-06 NOTE — Anesthesia Postprocedure Evaluation (Signed)
Anesthesia Post Note  Patient: Vanessa Oneal  Procedure(s) Performed: CATARACT EXTRACTION PHACO AND INTRAOCULAR LENS PLACEMENT (IOC) LEFT (Left Eye)  Patient location during evaluation: PACU Anesthesia Type: MAC Level of consciousness: awake and alert Pain management: pain level controlled Vital Signs Assessment: post-procedure vital signs reviewed and stable Respiratory status: spontaneous breathing, nonlabored ventilation, respiratory function stable and patient connected to nasal cannula oxygen Cardiovascular status: stable and blood pressure returned to baseline Postop Assessment: no apparent nausea or vomiting Anesthetic complications: no    Collyn Selk ELAINE

## 2019-05-06 NOTE — Anesthesia Preprocedure Evaluation (Signed)
Anesthesia Evaluation  Patient identified by MRN, date of birth, ID band Patient awake    Reviewed: Allergy & Precautions, H&P , NPO status , Patient's Chart, lab work & pertinent test results, reviewed documented beta blocker date and time   Airway Mallampati: II  TM Distance: >3 FB Neck ROM: full    Dental no notable dental hx.    Pulmonary COPD, Current Smoker,    Pulmonary exam normal breath sounds clear to auscultation       Cardiovascular Exercise Tolerance: Good + CAD   Rhythm:regular Rate:Normal     Neuro/Psych PSYCHIATRIC DISORDERS Anxiety Depression Fibromyalgia    GI/Hepatic GERD  ,(+) Hepatitis -, C  Endo/Other  negative endocrine ROS  Renal/GU negative Renal ROS  negative genitourinary   Musculoskeletal   Abdominal   Peds  Hematology negative hematology ROS (+)   Anesthesia Other Findings Chronic pain - on Suboxone  Reproductive/Obstetrics negative OB ROS                             Anesthesia Physical Anesthesia Plan  ASA: III  Anesthesia Plan: MAC   Post-op Pain Management:    Induction:   PONV Risk Score and Plan:   Airway Management Planned:   Additional Equipment:   Intra-op Plan:   Post-operative Plan:   Informed Consent: I have reviewed the patients History and Physical, chart, labs and discussed the procedure including the risks, benefits and alternatives for the proposed anesthesia with the patient or authorized representative who has indicated his/her understanding and acceptance.     Dental Advisory Given  Plan Discussed with: CRNA  Anesthesia Plan Comments:         Anesthesia Quick Evaluation

## 2019-05-06 NOTE — Transfer of Care (Signed)
Immediate Anesthesia Transfer of Care Note  Patient: Vanessa Oneal  Procedure(s) Performed: CATARACT EXTRACTION PHACO AND INTRAOCULAR LENS PLACEMENT (IOC) LEFT (Left Eye)  Patient Location: PACU  Anesthesia Type: MAC  Level of Consciousness: awake, alert  and patient cooperative  Airway and Oxygen Therapy: Patient Spontanous Breathing and Patient connected to supplemental oxygen  Post-op Assessment: Post-op Vital signs reviewed, Patient's Cardiovascular Status Stable, Respiratory Function Stable, Patent Airway and No signs of Nausea or vomiting  Post-op Vital Signs: Reviewed and stable  Complications: No apparent anesthesia complications

## 2019-05-06 NOTE — Op Note (Signed)
PREOPERATIVE DIAGNOSIS:  Nuclear sclerotic cataract of the left eye.   POSTOPERATIVE DIAGNOSIS:  Nuclear sclerotic cataract of the left eye.   OPERATIVE PROCEDURE: Procedure(s): CATARACT EXTRACTION PHACO AND INTRAOCULAR LENS PLACEMENT (IOC) LEFT   SURGEON:  Galen Manila, MD.   ANESTHESIA:  Anesthesiologist: Aldine Contes, MD CRNA: Maree Krabbe, CRNA  1.      Managed anesthesia care. 2.     0.55ml of Shugarcaine was instilled following the paracentesis   COMPLICATIONS:  None.   TECHNIQUE:   Stop and chop   DESCRIPTION OF PROCEDURE:  The patient was examined and consented in the preoperative holding area where the aforementioned topical anesthesia was applied to the left eye and then brought back to the Operating Room where the left eye was prepped and draped in the usual sterile ophthalmic fashion and a lid speculum was placed. A paracentesis was created with the side port blade and the anterior chamber was filled with viscoelastic. A near clear corneal incision was performed with the steel keratome. A continuous curvilinear capsulorrhexis was performed with a cystotome followed by the capsulorrhexis forceps. Hydrodissection and hydrodelineation were carried out with BSS on a blunt cannula. The lens was removed in a stop and chop  technique and the remaining cortical material was removed with the irrigation-aspiration handpiece. The capsular bag was inflated with viscoelastic and the Technis [id number] lens was placed in the capsular bag without complication. The remaining viscoelastic was removed from the eye with the irrigation-aspiration handpiece. The wounds were hydrated. The anterior chamber was flushed with Miostat and the eye was inflated to physiologic pressure. 0.84ml Vigamox was placed in the anterior chamber. The wounds were found to be water tight. The eye was dressed with Vigamox. The patient was given protective glasses to wear throughout the day and a shield with which to  sleep tonight. The patient was also given drops with which to begin a drop regimen today and will follow-up with me in one day. Implant Name Type Inv. Item Serial No. Manufacturer Lot No. LRB No. Used  LENS IOL DIOP 22.5 - [id number] Intraocular Lens LENS IOL DIOP 22.5 [id number] AMO  Left 1    Procedure(s): CATARACT EXTRACTION PHACO AND INTRAOCULAR LENS PLACEMENT (IOC) LEFT (Left)  Electronically signed: Galen Manila 05/06/2019 9:55 AM

## 2019-05-06 NOTE — Anesthesia Procedure Notes (Signed)
Procedure Name: MAC Date/Time: 05/06/2019 9:30 AM Performed by: Cameron Ali, CRNA Pre-anesthesia Checklist: Patient identified, Emergency Drugs available, Suction available, Timeout performed and Patient being monitored Patient Re-evaluated:Patient Re-evaluated prior to induction Oxygen Delivery Method: Nasal cannula Placement Confirmation: positive ETCO2

## 2019-05-07 ENCOUNTER — Encounter: Payer: Self-pay | Admitting: Ophthalmology

## 2019-05-23 ENCOUNTER — Telehealth: Payer: Self-pay

## 2019-05-23 NOTE — Telephone Encounter (Signed)
Call pt regarding lung screening. PT is a current smoker, smoking less tahn 1/4 pack a day. PT would like to have any day in the afternoon.

## 2019-05-30 ENCOUNTER — Ambulatory Visit: Admission: RE | Admit: 2019-05-30 | Payer: Medicaid Other | Source: Ambulatory Visit

## 2019-06-20 DIAGNOSIS — M1990 Unspecified osteoarthritis, unspecified site: Secondary | ICD-10-CM | POA: Diagnosis not present

## 2019-06-20 DIAGNOSIS — Z0389 Encounter for observation for other suspected diseases and conditions ruled out: Secondary | ICD-10-CM | POA: Diagnosis not present

## 2019-07-04 ENCOUNTER — Encounter: Payer: Self-pay | Admitting: Anesthesiology

## 2019-07-08 DIAGNOSIS — H2511 Age-related nuclear cataract, right eye: Secondary | ICD-10-CM | POA: Diagnosis not present

## 2019-07-09 NOTE — Discharge Instructions (Signed)

## 2019-07-11 ENCOUNTER — Other Ambulatory Visit: Admission: RE | Admit: 2019-07-11 | Payer: Medicare Other | Source: Ambulatory Visit

## 2019-07-15 ENCOUNTER — Encounter: Admission: RE | Payer: Self-pay | Source: Home / Self Care

## 2019-07-15 ENCOUNTER — Ambulatory Visit: Admission: RE | Admit: 2019-07-15 | Payer: Medicare Other | Source: Home / Self Care | Admitting: Ophthalmology

## 2019-07-15 SURGERY — PHACOEMULSIFICATION, CATARACT, WITH IOL INSERTION
Anesthesia: Topical | Laterality: Right

## 2019-07-18 DIAGNOSIS — M1990 Unspecified osteoarthritis, unspecified site: Secondary | ICD-10-CM | POA: Diagnosis not present

## 2019-07-18 DIAGNOSIS — Z0389 Encounter for observation for other suspected diseases and conditions ruled out: Secondary | ICD-10-CM | POA: Diagnosis not present

## 2019-08-01 DIAGNOSIS — Z0389 Encounter for observation for other suspected diseases and conditions ruled out: Secondary | ICD-10-CM | POA: Diagnosis not present

## 2019-08-01 DIAGNOSIS — M1990 Unspecified osteoarthritis, unspecified site: Secondary | ICD-10-CM | POA: Diagnosis not present

## 2019-08-13 ENCOUNTER — Telehealth: Payer: Self-pay | Admitting: *Deleted

## 2019-08-13 NOTE — Telephone Encounter (Signed)
Left message for patient to notify them that it is time to schedule annual low dose lung cancer screening CT scan. Instructed patient to call back to verify information prior to the scan being scheduled.  

## 2019-08-15 DIAGNOSIS — M1990 Unspecified osteoarthritis, unspecified site: Secondary | ICD-10-CM | POA: Diagnosis not present

## 2019-08-15 DIAGNOSIS — Z0389 Encounter for observation for other suspected diseases and conditions ruled out: Secondary | ICD-10-CM | POA: Diagnosis not present

## 2019-08-19 ENCOUNTER — Encounter: Payer: Self-pay | Admitting: *Deleted

## 2019-08-22 DIAGNOSIS — Z0389 Encounter for observation for other suspected diseases and conditions ruled out: Secondary | ICD-10-CM | POA: Diagnosis not present

## 2019-08-22 DIAGNOSIS — M1990 Unspecified osteoarthritis, unspecified site: Secondary | ICD-10-CM | POA: Diagnosis not present

## 2019-09-18 DIAGNOSIS — M1990 Unspecified osteoarthritis, unspecified site: Secondary | ICD-10-CM | POA: Diagnosis not present

## 2019-09-18 DIAGNOSIS — Z0389 Encounter for observation for other suspected diseases and conditions ruled out: Secondary | ICD-10-CM | POA: Diagnosis not present

## 2019-10-01 ENCOUNTER — Other Ambulatory Visit (HOSPITAL_COMMUNITY): Payer: Self-pay | Admitting: Family Medicine

## 2019-10-01 DIAGNOSIS — Z1231 Encounter for screening mammogram for malignant neoplasm of breast: Secondary | ICD-10-CM

## 2019-10-16 DIAGNOSIS — Z0389 Encounter for observation for other suspected diseases and conditions ruled out: Secondary | ICD-10-CM | POA: Diagnosis not present

## 2019-10-16 DIAGNOSIS — M1990 Unspecified osteoarthritis, unspecified site: Secondary | ICD-10-CM | POA: Diagnosis not present

## 2019-11-13 DIAGNOSIS — M1990 Unspecified osteoarthritis, unspecified site: Secondary | ICD-10-CM | POA: Diagnosis not present

## 2019-11-13 DIAGNOSIS — Z0389 Encounter for observation for other suspected diseases and conditions ruled out: Secondary | ICD-10-CM | POA: Diagnosis not present

## 2019-11-27 DIAGNOSIS — Z0389 Encounter for observation for other suspected diseases and conditions ruled out: Secondary | ICD-10-CM | POA: Diagnosis not present

## 2019-11-27 DIAGNOSIS — M1990 Unspecified osteoarthritis, unspecified site: Secondary | ICD-10-CM | POA: Diagnosis not present

## 2019-12-31 ENCOUNTER — Encounter: Payer: Self-pay | Admitting: Family Medicine

## 2019-12-31 ENCOUNTER — Ambulatory Visit (INDEPENDENT_AMBULATORY_CARE_PROVIDER_SITE_OTHER): Payer: Medicare Other | Admitting: Family Medicine

## 2019-12-31 DIAGNOSIS — B182 Chronic viral hepatitis C: Secondary | ICD-10-CM

## 2019-12-31 DIAGNOSIS — R252 Cramp and spasm: Secondary | ICD-10-CM

## 2019-12-31 DIAGNOSIS — I2510 Atherosclerotic heart disease of native coronary artery without angina pectoris: Secondary | ICD-10-CM | POA: Diagnosis not present

## 2019-12-31 DIAGNOSIS — E559 Vitamin D deficiency, unspecified: Secondary | ICD-10-CM

## 2019-12-31 NOTE — Progress Notes (Signed)
There were no vitals taken for this visit.   Subjective:    Patient ID: Vanessa Oneal, female    DOB: Jun 03, 1951, 69 y.o.   MRN: [id number]  HPI: Vanessa Oneal is a 69 y.o. female who presents today for evaluation after being lost to follow up for 23 months.  Chief Complaint  Patient presents with  . RLS    pt states she has been having restless legs at night    RESTLESS LEGS- has been having bad cramps in her legs for about 6 weeks Duration: 6 weeks Discomfort description:  cramping Pain: yes Location: calves, not symetric Bilateral: yes Symmetric: no Severity: severe Onset:  sudden Frequency:  intermittent Symptoms only occur while legs at rest: yes Sudden unintentional leg jerking: no Bed partner bothered by leg movements: no LE numbness: no Decreased sensation: no Weakness: no Insomnia: yes Daytime somnolence: no Fatigue: yes Alleviating factors: mustard, salt Aggravating factors: nothing Status: worse  Relevant past medical, surgical, family and social history reviewed and updated as indicated. Interim medical history since our last visit reviewed. Allergies and medications reviewed and updated.  Review of Systems  Constitutional: Positive for fatigue. Negative for activity change, appetite change, chills, diaphoresis, fever and unexpected weight change.  Respiratory: Negative.   Cardiovascular: Negative.   Musculoskeletal: Positive for myalgias. Negative for arthralgias, back pain, gait problem, joint swelling, neck pain and neck stiffness.  Skin: Negative.   Neurological: Negative.   Psychiatric/Behavioral: Positive for sleep disturbance. Negative for agitation, behavioral problems, confusion, decreased concentration, dysphoric mood, hallucinations, self-injury and suicidal ideas. The patient is not nervous/anxious and is not hyperactive.     Per HPI unless specifically indicated above     Objective:    There were no vitals taken for this visit.    Wt Readings from Last 3 Encounters:  05/06/19 89 lb 1.6 oz (40.4 kg)  02/21/18 84 lb (38.1 kg)  02/04/18 84 lb 12.8 oz (38.5 kg)    Physical Exam Vitals and nursing note reviewed.  Pulmonary:     Effort: Pulmonary effort is normal. No respiratory distress.     Comments: Speaking in full sentences Neurological:     Mental Status: She is alert.  Psychiatric:        Mood and Affect: Mood normal.        Behavior: Behavior normal.        Thought Content: Thought content normal.        Judgment: Judgment normal.     Results for orders placed or performed during the hospital encounter of 05/02/19  Novel Coronavirus, NAA (hospital order; send-out to ref lab)   Specimen: Nasopharyngeal Swab; Respiratory  Result Value Ref Range   SARS-CoV-2, NAA NOT DETECTED NOT DETECTED   Coronavirus Source NASOPHARYNGEAL       Assessment & Plan:   Problem List Items Addressed This Visit      Cardiovascular and Mediastinum   CAD (coronary artery disease)    Will check labs. Await results. Treat as needed.       Relevant Orders   CBC with Differential/Platelet   Comprehensive metabolic panel   Iron and TIBC   Lipid Panel w/o Chol/HDL Ratio     Digestive   Chronic hepatitis C without hepatic coma (HCC)    Unclear if she was ever treated. Will check labs and get her established if needed. Await results.       Relevant Orders   CBC with Differential/Platelet   Comprehensive metabolic panel  HCV RNA quant   Hepatitis C Genotype     Other   Vitamin D deficiency, unspecified    Will check labs. Await results. Treat as needed.       Relevant Orders   CBC with Differential/Platelet   Comprehensive metabolic panel   VITAMIN D 25 Hydroxy (Vit-D Deficiency, Fractures)    Other Visit Diagnoses    Leg cramps    -  Primary   Will check labs and treat as needed. Call with any concerns. Await results.    Relevant Orders   Bayer DCA Hb A1c Waived   CBC with Differential/Platelet    Comprehensive metabolic panel   HIV Antibody (routine testing w rflx)   Iron and TIBC   TSH   UA/M w/rflx Culture, Routine   VITAMIN D 25 Hydroxy (Vit-D Deficiency, Fractures)   Ferritin       Follow up plan: Return in about 2 weeks (around 01/14/2020) for in person.    . This visit was completed via telephone due to the restrictions of the COVID-19 pandemic. All issues as above were discussed and addressed but no physical exam was performed. If it was felt that the patient should be evaluated in the office, they were directed there. The patient verbally consented to this visit. Patient was unable to complete an audio/visual visit due to Lack of equipment. Due to the catastrophic nature of the COVID-19 pandemic, this visit was done through audio contact only. . Location of the patient: home . Location of the provider: work . Those involved with this call:  . Provider: Park Liter, DO . CMA: Tiffany Reel, CMA . Front Desk/Registration: Don Perking  . Time spent on call: 15 minutes on the phone discussing health concerns. 23 minutes total spent in review of patient's record and preparation of their chart.

## 2019-12-31 NOTE — Assessment & Plan Note (Signed)
Will check labs. Await results. Treat as needed.  

## 2019-12-31 NOTE — Assessment & Plan Note (Signed)
Unclear if she was ever treated. Will check labs and get her established if needed. Await results.

## 2020-01-02 DIAGNOSIS — M1990 Unspecified osteoarthritis, unspecified site: Secondary | ICD-10-CM | POA: Diagnosis not present

## 2020-01-02 DIAGNOSIS — Z0389 Encounter for observation for other suspected diseases and conditions ruled out: Secondary | ICD-10-CM | POA: Diagnosis not present

## 2020-01-05 ENCOUNTER — Other Ambulatory Visit: Payer: Medicare Other

## 2020-01-05 ENCOUNTER — Other Ambulatory Visit: Payer: Self-pay

## 2020-01-05 DIAGNOSIS — E559 Vitamin D deficiency, unspecified: Secondary | ICD-10-CM | POA: Diagnosis not present

## 2020-01-05 DIAGNOSIS — R739 Hyperglycemia, unspecified: Secondary | ICD-10-CM | POA: Diagnosis not present

## 2020-01-05 DIAGNOSIS — B182 Chronic viral hepatitis C: Secondary | ICD-10-CM

## 2020-01-05 DIAGNOSIS — R8281 Pyuria: Secondary | ICD-10-CM | POA: Diagnosis not present

## 2020-01-05 DIAGNOSIS — I2510 Atherosclerotic heart disease of native coronary artery without angina pectoris: Secondary | ICD-10-CM

## 2020-01-05 DIAGNOSIS — R252 Cramp and spasm: Secondary | ICD-10-CM

## 2020-01-05 NOTE — Progress Notes (Signed)
Called to schedule, no answer, left vm 

## 2020-01-07 LAB — UA/M W/RFLX CULTURE, ROUTINE
Bilirubin, UA: NEGATIVE
Glucose, UA: NEGATIVE
Ketones, UA: NEGATIVE
Nitrite, UA: NEGATIVE
Protein,UA: NEGATIVE
RBC, UA: NEGATIVE
Specific Gravity, UA: 1.01 (ref 1.005–1.030)
Urobilinogen, Ur: 0.2 mg/dL (ref 0.2–1.0)
pH, UA: 7 (ref 5.0–7.5)

## 2020-01-07 LAB — BAYER DCA HB A1C WAIVED: HB A1C (BAYER DCA - WAIVED): 4.8 % (ref <7.0)

## 2020-01-07 LAB — URINE CULTURE, REFLEX

## 2020-01-07 LAB — MICROSCOPIC EXAMINATION
Bacteria, UA: NONE SEEN
RBC, Urine: NONE SEEN /hpf (ref 0–2)

## 2020-01-08 LAB — COMPREHENSIVE METABOLIC PANEL
ALT: 14 IU/L (ref 0–32)
AST: 34 IU/L (ref 0–40)
Albumin/Globulin Ratio: 1.6 (ref 1.2–2.2)
Albumin: 4.5 g/dL (ref 3.8–4.8)
Alkaline Phosphatase: 91 IU/L (ref 39–117)
BUN/Creatinine Ratio: 15 (ref 12–28)
BUN: 10 mg/dL (ref 8–27)
Bilirubin Total: 0.5 mg/dL (ref 0.0–1.2)
CO2: 26 mmol/L (ref 20–29)
Calcium: 9.6 mg/dL (ref 8.7–10.3)
Chloride: 99 mmol/L (ref 96–106)
Creatinine, Ser: 0.65 mg/dL (ref 0.57–1.00)
GFR calc Af Amer: 105 mL/min/{1.73_m2} (ref >59)
GFR calc non Af Amer: 92 mL/min/{1.73_m2} (ref >59)
Globulin, Total: 2.9 g/dL (ref 1.5–4.5)
Glucose: 108 mg/dL — ABNORMAL HIGH (ref 65–99)
Potassium: 4.4 mmol/L (ref 3.5–5.2)
Sodium: 140 mmol/L (ref 134–144)
Total Protein: 7.4 g/dL (ref 6.0–8.5)

## 2020-01-08 LAB — HCV RNA QUANT
HCV log10: 6.947 log10 IU/mL
Hepatitis C Quantitation: 8850000 IU/mL

## 2020-01-08 LAB — IRON AND TIBC
Iron Saturation: 25 % (ref 15–55)
Iron: 83 ug/dL (ref 27–139)
Total Iron Binding Capacity: 330 ug/dL (ref 250–450)
UIBC: 247 ug/dL (ref 118–369)

## 2020-01-08 LAB — CBC WITH DIFFERENTIAL/PLATELET
Basophils Absolute: 0 10*3/uL (ref 0.0–0.2)
Basos: 1 %
EOS (ABSOLUTE): 0.5 10*3/uL — ABNORMAL HIGH (ref 0.0–0.4)
Eos: 8 %
Hematocrit: 37.2 % (ref 34.0–46.6)
Hemoglobin: 13 g/dL (ref 11.1–15.9)
Immature Grans (Abs): 0 10*3/uL (ref 0.0–0.1)
Immature Granulocytes: 0 %
Lymphocytes Absolute: 2 10*3/uL (ref 0.7–3.1)
Lymphs: 35 %
MCH: 34.1 pg — ABNORMAL HIGH (ref 26.6–33.0)
MCHC: 34.9 g/dL (ref 31.5–35.7)
MCV: 98 fL — ABNORMAL HIGH (ref 79–97)
Monocytes Absolute: 0.6 10*3/uL (ref 0.1–0.9)
Monocytes: 11 %
Neutrophils Absolute: 2.6 10*3/uL (ref 1.4–7.0)
Neutrophils: 45 %
Platelets: 182 10*3/uL (ref 150–450)
RBC: 3.81 x10E6/uL (ref 3.77–5.28)
RDW: 13 % (ref 11.7–15.4)
WBC: 5.7 10*3/uL (ref 3.4–10.8)

## 2020-01-08 LAB — TSH: TSH: 3.26 u[IU]/mL (ref 0.450–4.500)

## 2020-01-08 LAB — LIPID PANEL W/O CHOL/HDL RATIO
Cholesterol, Total: 179 mg/dL (ref 100–199)
HDL: 95 mg/dL (ref >39)
LDL Chol Calc (NIH): 72 mg/dL (ref 0–99)
Triglycerides: 63 mg/dL (ref 0–149)
VLDL Cholesterol Cal: 12 mg/dL (ref 5–40)

## 2020-01-08 LAB — VITAMIN D 25 HYDROXY (VIT D DEFICIENCY, FRACTURES): Vit D, 25-Hydroxy: 8.8 ng/mL — ABNORMAL LOW (ref 30.0–100.0)

## 2020-01-08 LAB — HIV ANTIBODY (ROUTINE TESTING W REFLEX): HIV Screen 4th Generation wRfx: NONREACTIVE

## 2020-01-08 LAB — HEPATITIS C GENOTYPE

## 2020-01-08 LAB — FERRITIN: Ferritin: 105 ng/mL (ref 15–150)

## 2020-01-09 ENCOUNTER — Telehealth: Payer: Self-pay | Admitting: Family Medicine

## 2020-01-09 ENCOUNTER — Other Ambulatory Visit: Payer: Medicare Other

## 2020-01-09 NOTE — Telephone Encounter (Signed)
LVM for pt to call back to schedule appt

## 2020-01-09 NOTE — Telephone Encounter (Signed)
-----   Message from Dorcas Carrow, DO sent at 12/31/2019 10:55 AM EST ----- 2 weeks in person

## 2020-01-12 ENCOUNTER — Telehealth: Payer: Self-pay | Admitting: Family Medicine

## 2020-01-12 DIAGNOSIS — B182 Chronic viral hepatitis C: Secondary | ICD-10-CM

## 2020-01-12 MED ORDER — VITAMIN D (ERGOCALCIFEROL) 1.25 MG (50000 UNIT) PO CAPS: 50000.0000 [IU] | ORAL_CAPSULE | ORAL | 0 refills | Status: DC

## 2020-01-12 NOTE — Telephone Encounter (Signed)
Please let her know that her labs look generally good, but her vitamin D is very low and she still has not been treated for her hep c. I've put in a new referral for her to go see the liver doctor and they will help with that. Thanks!

## 2020-01-12 NOTE — Telephone Encounter (Signed)
Called and left a message with patients niece to get her to give [place] a call.

## 2020-01-12 NOTE — Telephone Encounter (Signed)
Patient notified

## 2020-01-19 NOTE — Telephone Encounter (Signed)
LVM for pt to call back to schedule appt

## 2020-01-26 ENCOUNTER — Ambulatory Visit (INDEPENDENT_AMBULATORY_CARE_PROVIDER_SITE_OTHER): Payer: Medicare Other

## 2020-01-26 DIAGNOSIS — Z0000 Encounter for general adult medical examination without abnormal findings: Secondary | ICD-10-CM

## 2020-01-26 NOTE — Progress Notes (Signed)
Subjective:   Vanessa Oneal is a 69 y.o. female who presents for Medicare Annual (Subsequent) preventive examination.  This visit is being conducted via phone call  - after an attmept to do on video chat - due to the COVID-19 pandemic. This patient has given me verbal consent via phone to conduct this visit, patient states they are participating from their home address. Some vital signs may be absent or patient reported.   Patient identification: identified by name, DOB, and current address.    Review of Systems:   Cardiac Risk Factors include: advanced age (>35men, >12 women)     Objective:     Vitals: There were no vitals taken for this visit.  There is no height or weight on file to calculate BMI.  Advanced Directives 01/26/2020 05/06/2019 02/04/2018 02/09/2016  Does Patient Have a Medical Advance Directive? No No No No  Would patient like information on creating a medical advance directive? Yes (MAU/Ambulatory/Procedural Areas - Information given) Yes (MAU/Ambulatory/Procedural Areas - Information given) Yes (MAU/Ambulatory/Procedural Areas - Information given) -    Tobacco Social History   Tobacco Use  Smoking Status Light Tobacco Smoker  . Packs/day: 0.25  . Years: 56.00  . Pack years: 14.00  . Types: Cigarettes  . Last attempt to quit: 11/04/2019  . Years since quitting: 0.2  Smokeless Tobacco Never Used  Tobacco Comment   1 pack with last a couple days      Ready to quit: No Counseling given: Yes Comment: 1 pack with last a couple days    Clinical Intake:  Pre-visit preparation completed: Yes  Pain : No/denies pain     Nutritional Risks: None Diabetes: No  How often do you need to have someone help you when you read instructions, pamphlets, or other written materials from your doctor or pharmacy?: 1 - Never  Interpreter Needed?: No  Information entered by :: Hanley Rispoli,LPN  Past Medical History:  Diagnosis Date  . Arthritis    RA  . Depression    . Fibromyalgia   . GERD (gastroesophageal reflux disease)   . H/O drug dependence ([place])    opiods  . Wears dentures    full upper   Past Surgical History:  Procedure Laterality Date  . BREAST SURGERY     Silicone implants, then removal  . CATARACT EXTRACTION W/PHACO Left 05/06/2019   Procedure: CATARACT EXTRACTION PHACO AND INTRAOCULAR LENS PLACEMENT ([place]) LEFT;  Surgeon: Birder Robson, MD;  Location: [facility];  Service: Ophthalmology;  Laterality: Left;  . TUBAL LIGATION     Family History  Problem Relation Age of Onset  . Suicidality Father   . COPD Sister    Social History   Socioeconomic History  . Marital status: Widowed    Spouse name: Not on file  . Number of children: Not on file  . Years of education: Not on file  . Highest education level: Not on file  Occupational History  . Not on file  Tobacco Use  . Smoking status: Light Tobacco Smoker    Packs/day: 0.25    Years: 56.00    Pack years: 14.00    Types: Cigarettes    Last attempt to quit: 11/04/2019    Years since quitting: 0.2  . Smokeless tobacco: Never Used  . Tobacco comment: 1 pack with last a couple days   Substance and Sexual Activity  . Alcohol use: Yes    Alcohol/week: 2.0 standard drinks    Types: 2 Cans  of beer per week    Comment: couple of beers a week   . Drug use: No  . Sexual activity: Not Currently  Other Topics Concern  . Not on file  Social History Narrative   ** Merged History Encounter **       Social Determinants of Health   Financial Resource Strain:   . Difficulty of Paying Living Expenses: Not on file  Food Insecurity:   . Worried About Programme researcher, broadcasting/film/video in the Last Year: Not on file  . Ran Out of Food in the Last Year: Not on file  Transportation Needs:   . Lack of Transportation (Medical): Not on file  . Lack of Transportation (Non-Medical): Not on file  Physical Activity:   . Days of Exercise per Week: Not on file  . Minutes of Exercise per  Session: Not on file  Stress:   . Feeling of Stress : Not on file  Social Connections:   . Frequency of Communication with Friends and Family: Not on file  . Frequency of Social Gatherings with Friends and Family: Not on file  . Attends Religious Services: Not on file  . Active Member of Clubs or Organizations: Not on file  . Attends Banker Meetings: Not on file  . Marital Status: Not on file    Outpatient Encounter Medications as of 01/26/2020  Medication Sig  . SUBOXONE 8-2 MG FILM Take 1 Film by mouth 2 (two) times daily.   . Vitamin D, Ergocalciferol, (DRISDOL) 1.25 MG (50000 UNIT) CAPS capsule Take 1 capsule (50,000 Units total) by mouth every 7 (seven) days.  . [DISCONTINUED] Carboxymethylcellulose Sodium (ARTIFICIAL TEARS OP) Place 1 drop into the left eye See admin instructions. Every hour   No facility-administered encounter medications on file as of 01/26/2020.    Activities of Daily Living In your present state of health, do you have any difficulty performing the following activities: 01/26/2020 05/06/2019  Hearing? Y N  Comment no hearing aids -  Vision? Y N  Comment eyeglasses, [facility] -  Difficulty concentrating or making decisions? N N  Walking or climbing stairs? N N  Dressing or bathing? N N  Doing errands, shopping? N -  Comment niece helps -  Quarry manager and eating ? N -  Using the Toilet? N -  In the past six months, have you accidently leaked urine? N -  Do you have problems with loss of bowel control? N -  Managing your Medications? N -  Managing your Finances? N -  Housekeeping or managing your Housekeeping? N -  Some recent data might be hidden    Patient Care Team: Dorcas Carrow, DO as PCP - General (Family Medicine) System, Pcp Not In    Assessment:   This is a routine wellness examination for Vanessa Oneal.  Exercise Activities and Dietary recommendations Current Exercise Habits: Home exercise routine, Type of exercise:  strength training/weights, Time (Minutes): 10, Frequency (Times/Week): 7, Weekly Exercise (Minutes/Week): 70, Intensity: Mild, Exercise limited by: None identified  Goals    . DIET - INCREASE WATER INTAKE     Recommend drinking at least 6-8 glasses of water a day        Fall Risk: Fall Risk  01/26/2020 02/04/2018 11/22/2017  Falls in the past year? 0 No No  Number falls in past yr: 0 - -  Injury with Fall? 0 - -    FALL RISK PREVENTION PERTAINING TO THE HOME:  Any stairs  in or around the home? Yes  If so, are there any without handrails? No   Home free of loose throw rugs in walkways, pet beds, electrical cords, etc? Yes  Adequate lighting in your home to reduce risk of falls? Yes   ASSISTIVE DEVICES UTILIZED TO PREVENT FALLS:  Life alert? No  Use of a cane, walker or w/c? No  Grab bars in the bathroom? No  Shower chair or bench in shower? no Elevated toilet seat or a handicapped toilet? No   DME ORDERS:  DME order needed?  No   TIMED UP AND GO:  Unable to perform    Depression Screen PHQ 2/9 Scores 01/26/2020 02/04/2018 12/11/2017 11/22/2017  PHQ - 2 Score 1 2 3 2   PHQ- 9 Score - 4 8 4      Cognitive Function     6CIT Screen 02/04/2018  What Year? 0 points  What month? 0 points  What time? 0 points  Count back from 20 0 points  Months in reverse 0 points  Repeat phrase 0 points  Total Score 0    Immunization History  Administered Date(s) Administered  . Influenza, High Dose Seasonal PF 11/19/2017  . Influenza, Seasonal, Injecte, Preservative Fre 10/19/2006  . Influenza,inj,Quad PF,6+ Mos 09/18/2012, 08/06/2013, 09/03/2015  . Pneumococcal Conjugate-13 11/22/2017    Qualifies for Shingles Vaccine? Yes  Zostavax completed n/a. Due for Shingrix. Education has been provided regarding the importance of this vaccine. Pt has been advised to call insurance company to determine out of pocket expense. Advised may also receive vaccine at local pharmacy or Health Dept.  Verbalized acceptance and understanding.  Tdap: Discussed need for TD/TDAP vaccine, patient verbalized understanding that this is not covered as a preventative with there insurance and to call the office if she develops any new skin injuries, ie: cuts, scrapes, bug bites, or open wounds.  Flu Vaccine: due now, declined   Pneumococcal Vaccine: due now, will get at next visit.   Covid-19: information provided.   Screening Tests Health Maintenance  Topic Date Due  . MAMMOGRAM  10/27/2001  . COLONOSCOPY  10/27/2001  . DEXA SCAN  10/27/2016  . PNA vac Low Risk Adult (2 of 2 - PPSV23) 11/22/2018  . INFLUENZA VACCINE  03/03/2020 (Originally 07/05/2019)  . TETANUS/TDAP  01/25/2021 (Originally 10/27/1970)  . Hepatitis C Screening  Completed    Cancer Screenings:  Colorectal Screening: . Referral to GI placed. Pt aware the office will call re: appt.  Mammogram: ordered  Bone Density: ordered  Lung Cancer Screening: (Low Dose CT Chest recommended if Age 8-80 years, 30 pack-year currently smoking OR have quit w/in 15years.) does not qualify.    Additional Screening:  Hepatitis C Screening: does qualify; Completed 01/12/2020  Vision Screening: Recommended annual ophthalmology exams for early detection of glaucoma and other disorders of the eye. Is the patient up to date with their annual eye exam?  Yes  Who is the provider or what is the name of the office in which the pt attends annual eye exams? [facility]    Dental Screening: Recommended annual dental exams for proper oral hygiene  Community Resource Referral:  CRR required this visit?  No       Plan:  I have personally reviewed and addressed the Medicare Annual Wellness questionnaire and have noted the following in the patient's chart:  A. Medical and social history B. Use of alcohol, tobacco or illicit drugs  C. Current medications and supplements D. Functional ability and  status E.  Nutritional status F.    Physical activity G. Advance directives H. List of other physicians I.  Hospitalizations, surgeries, and ER visits in previous 12 months J.  Vitals K. Screenings such as hearing and vision if needed, cognitive and depression L. Referrals and appointments   In addition, I have reviewed and discussed with patient certain preventive protocols, quality metrics, and best practice recommendations. A written personalized care plan for preventive services as well as general preventive health recommendations were provided to patient.  Signed,    Collene Schlichter, LPN  5/57/3220 Nurse Health Advisor   Nurse Notes: none

## 2020-01-30 DIAGNOSIS — F17200 Nicotine dependence, unspecified, uncomplicated: Secondary | ICD-10-CM | POA: Diagnosis not present

## 2020-02-05 ENCOUNTER — Ambulatory Visit: Payer: Medicare Other | Admitting: Family Medicine

## 2020-02-23 ENCOUNTER — Encounter: Payer: Self-pay | Admitting: Gastroenterology

## 2020-02-23 ENCOUNTER — Ambulatory Visit: Payer: Medicare Other | Admitting: Gastroenterology

## 2020-02-23 DIAGNOSIS — B182 Chronic viral hepatitis C: Secondary | ICD-10-CM

## 2020-02-23 NOTE — Progress Notes (Deleted)
Gastroenterology Consultation  Referring Provider:     Valerie Roys, DO Primary Care Physician:  Valerie Roys, DO Primary Gastroenterologist:  Dr. Allen Norris     Reason for Consultation:     Hepatitis C        HPI:   Vanessa Oneal is a 69 y.o. y/o female referred for consultation & management of hepatitis C by Dr. Wynetta Emery, Megan P, DO.  This patient comes in today for evaluation of chronic hepatitis C.  It appears that the patient had a positive viral load with a viral load going from 4,500,000 to 8,800,000 since 2 years ago with the genotype being 2b.  Past Medical History:  Diagnosis Date  . Arthritis    RA  . Depression   . Fibromyalgia   . GERD (gastroesophageal reflux disease)   . H/O drug dependence ([place])    opiods  . Wears dentures    full upper    Past Surgical History:  Procedure Laterality Date  . BREAST SURGERY     Silicone implants, then removal  . CATARACT EXTRACTION W/PHACO Left 05/06/2019   Procedure: CATARACT EXTRACTION PHACO AND INTRAOCULAR LENS PLACEMENT ([place]) LEFT;  Surgeon: Birder Robson, MD;  Location: [facility];  Service: Ophthalmology;  Laterality: Left;  . TUBAL LIGATION      Prior to Admission medications   Medication Sig Start Date End Date Taking? Authorizing Provider  SUBOXONE 8-2 MG FILM Take 1 Film by mouth 2 (two) times daily.  10/31/17   [provider]  Vitamin D, Ergocalciferol, (DRISDOL) 1.25 MG (50000 UNIT) CAPS capsule Take 1 capsule (50,000 Units total) by mouth every 7 (seven) days. 01/12/20   Valerie Roys, DO    Family History  Problem Relation Age of Onset  . Suicidality Father   . COPD Sister      Social History   Tobacco Use  . Smoking status: Light Tobacco Smoker    Packs/day: 0.25    Years: 56.00    Pack years: 14.00    Types: Cigarettes    Last attempt to quit: 11/04/2019    Years since quitting: 0.3  . Smokeless tobacco: Never Used  . Tobacco comment: 1 pack with last a couple days     Substance Use Topics  . Alcohol use: Yes    Alcohol/week: 2.0 standard drinks    Types: 2 Cans of beer per week    Comment: couple of beers a week   . Drug use: No    Allergies as of 02/23/2020 - Review Complete 01/26/2020  Allergen Reaction Noted  . Gabapentin Anaphylaxis 11/19/2017    Review of Systems:    All systems reviewed and negative except where noted in HPI.   Physical Exam:  There were no vitals taken for this visit. No LMP recorded. Patient is postmenopausal. General:   Alert,  Well-developed, well-nourished, pleasant and cooperative in NAD Head:  Normocephalic and atraumatic. Eyes:  Sclera clear, no icterus.   Conjunctiva pink. Ears:  Normal auditory acuity. Neck:  Supple; no masses or thyromegaly. Lungs:  Respirations even and unlabored.  Clear throughout to auscultation.   No wheezes, crackles, or rhonchi. No acute distress. Heart:  Regular rate and rhythm; no murmurs, clicks, rubs, or gallops. Abdomen:  Normal bowel sounds.  No bruits.  Soft, non-tender and non-distended without masses, hepatosplenomegaly or hernias noted.  No guarding or rebound tenderness.  Negative Carnett sign.   Rectal:  Deferred.  Pulses:  Normal pulses noted.  Extremities:  No clubbing or edema.  No cyanosis. Neurologic:  Alert and oriented x3;  grossly normal neurologically. Skin:  Intact without significant lesions or rashes.  No jaundice. Lymph Nodes:  No significant cervical adenopathy. Psych:  Alert and cooperative. Normal mood and affect.  Imaging Studies: No results found.  Assessment and Plan:   Vanessa Oneal is a 69 y.o. y/o female ***    Midge Minium, MD. Clementeen Graham    Note: This dictation was prepared with Dragon dictation along with smaller phrase technology. Any transcriptional errors that result from this process are unintentional.

## 2020-02-26 ENCOUNTER — Ambulatory Visit: Payer: Medicare Other | Admitting: Family Medicine

## 2020-02-27 DIAGNOSIS — F17200 Nicotine dependence, unspecified, uncomplicated: Secondary | ICD-10-CM | POA: Diagnosis not present

## 2020-02-27 DIAGNOSIS — Z0389 Encounter for observation for other suspected diseases and conditions ruled out: Secondary | ICD-10-CM | POA: Diagnosis not present

## 2020-03-04 ENCOUNTER — Ambulatory Visit (INDEPENDENT_AMBULATORY_CARE_PROVIDER_SITE_OTHER): Payer: Medicare Other | Admitting: Family Medicine

## 2020-03-04 ENCOUNTER — Ambulatory Visit: Payer: Self-pay | Admitting: Licensed Clinical Social Worker

## 2020-03-04 ENCOUNTER — Telehealth: Payer: Self-pay | Admitting: Family Medicine

## 2020-03-04 ENCOUNTER — Other Ambulatory Visit: Payer: Self-pay

## 2020-03-04 ENCOUNTER — Encounter: Payer: Self-pay | Admitting: Family Medicine

## 2020-03-04 VITALS — BP 143/82 | HR 94 | Temp 97.8°F | Ht <= 58 in | Wt 86.2 lb

## 2020-03-04 DIAGNOSIS — F419 Anxiety disorder, unspecified: Secondary | ICD-10-CM

## 2020-03-04 DIAGNOSIS — B182 Chronic viral hepatitis C: Secondary | ICD-10-CM | POA: Diagnosis not present

## 2020-03-04 DIAGNOSIS — Z23 Encounter for immunization: Secondary | ICD-10-CM

## 2020-03-04 DIAGNOSIS — I2510 Atherosclerotic heart disease of native coronary artery without angina pectoris: Secondary | ICD-10-CM

## 2020-03-04 NOTE — Assessment & Plan Note (Signed)
Has not seen GI yet. Will reschedule her appointment. Will get CCM involved to help with transportation to get her there. Continue to monitor. Call with any concerns.

## 2020-03-04 NOTE — Chronic Care Management (AMB) (Signed)
  Care Management   Follow Up Note   03/04/2020 Name: Vanessa Oneal MRN: [id number] DOB: 29-May-1951  Referred by: Dorcas Carrow, DO Reason for referral : No chief complaint on file.   Vanessa Oneal is a 69 y.o. year old female who is a primary care patient of Dorcas Carrow, DO. The care management team was consulted for assistance with care management and care coordination needs.    Review of patient status, including review of consultants reports, relevant laboratory and other test results, and collaboration with appropriate care team members and the patient's provider was performed as part of comprehensive patient evaluation and provision of chronic care management services.    PCP sent urgent referral to CCM Team. Referral for transportation assistance completed for [facility] today. CCM Program will schedule appointments and provide appropriate follow up.   The care management team will reach out to the patient again over the next 30 days.   Dickie La, BSW, MSW, LCSW [facility] Care Management [place]  [facility] [email or web address] Phone: [phone]

## 2020-03-04 NOTE — Telephone Encounter (Signed)
   KNB 03/04/2020 called pt to discuss transportation, she was in car asked that I call her back later today  Name: Vanessa Oneal   MRN: [id number]   DOB: 02-05-1951   AGE: 69 y.o.   GENDER: female   PCP Olevia Perches P, DO.   Follow up on: 03/04/2020 at 1pm  [facility] Guide . Embedded Care Coordination [facility] Management [email or web address]  [phone]

## 2020-03-04 NOTE — Telephone Encounter (Signed)
LM for patient with details for trip on 4/7 pick up between 8 and 8:30am and Reservation # [phone] for any changes she can call [phone]  Closing referral pending any other needs of patient.

## 2020-03-04 NOTE — Telephone Encounter (Signed)
   KNB 03/04/2020 LMTCB Name: Vanessa Oneal   MRN: [id number]   DOB: [birth year]   AGE: 69 y.o.   GENDER: female   PCP Olevia Perches P, DO.   Called pt regarding Community Resource Referral for [employer]   [facility] . Embedded Care Coordination [facility] Management [email or web address] [email or web address]  [phone]

## 2020-03-04 NOTE — Progress Notes (Signed)
BP (!) 143/82 (BP Location: Left Arm, Patient Position: Sitting, Cuff Size: Small)   Pulse 94   Temp 97.8 F (36.6 C) (Oral)   Ht 4' 9.78" (1.468 m)   Wt 86 lb 3.2 oz (39.1 kg)   SpO2 90%   BMI 18.16 kg/m    Subjective:    Patient ID: Vanessa Oneal, female    DOB: 03/06/1951, 69 y.o.   MRN: [id number]  HPI: Vanessa Oneal is a 69 y.o. female  Chief Complaint  Patient presents with  . Follow-up   HEPATITIS C Duration since diagnosis: chronic Genotype: 2b  Viral load:  8,850,000 Hepatology evaluation:no Liver biopsy:no  Cirrhosis: unknown Antiviral therapy:no Hepatocellular carcinoma screening: no Esophageal varices screening/EGD: no Hepatitis A Vaccine: unknown Hepatitis B Vaccine: unknown Pneumovax Vaccine: Given today  Relevant past medical, surgical, family and social history reviewed and updated as indicated. Interim medical history since our last visit reviewed. Allergies and medications reviewed and updated.  Review of Systems  Constitutional: Negative.   Respiratory: Negative.   Cardiovascular: Negative.   Gastrointestinal: Negative.   Musculoskeletal: Negative.   Skin: Negative.   Psychiatric/Behavioral: Positive for agitation and dysphoric mood. Negative for behavioral problems, confusion, decreased concentration, hallucinations, self-injury, sleep disturbance and suicidal ideas. The patient is nervous/anxious. The patient is not hyperactive.     Per HPI unless specifically indicated above     Objective:    BP (!) 143/82 (BP Location: Left Arm, Patient Position: Sitting, Cuff Size: Small)   Pulse 94   Temp 97.8 F (36.6 C) (Oral)   Ht 4' 9.78" (1.468 m)   Wt 86 lb 3.2 oz (39.1 kg)   SpO2 90%   BMI 18.16 kg/m   Wt Readings from Last 3 Encounters:  03/04/20 86 lb 3.2 oz (39.1 kg)  05/06/19 89 lb 1.6 oz (40.4 kg)  02/21/18 84 lb (38.1 kg)    Physical Exam Vitals and nursing note reviewed.  Constitutional:      General: She is not in  acute distress.    Appearance: Normal appearance. She is not ill-appearing, toxic-appearing or diaphoretic.  HENT:     Head: Normocephalic and atraumatic.     Right Ear: External ear normal.     Left Ear: External ear normal.     Nose: Nose normal.     Mouth/Throat:     Mouth: Mucous membranes are moist.     Pharynx: Oropharynx is clear.  Eyes:     General: No scleral icterus.       Right eye: No discharge.        Left eye: No discharge.     Extraocular Movements: Extraocular movements intact.     Conjunctiva/sclera: Conjunctivae normal.     Pupils: Pupils are equal, round, and reactive to light.  Cardiovascular:     Rate and Rhythm: Normal rate and regular rhythm.     Pulses: Normal pulses.     Heart sounds: Normal heart sounds. No murmur. No friction rub. No gallop.   Pulmonary:     Effort: Pulmonary effort is normal. No respiratory distress.     Breath sounds: Normal breath sounds. No stridor. No wheezing, rhonchi or rales.  Chest:     Chest wall: No tenderness.  Musculoskeletal:        General: Normal range of motion.     Cervical back: Normal range of motion and neck supple.  Skin:    General: Skin is warm and dry.     Capillary  Refill: Capillary refill takes less than 2 seconds.     Coloration: Skin is not jaundiced or pale.     Findings: No bruising, erythema, lesion or rash.  Neurological:     General: No focal deficit present.     Mental Status: She is alert and oriented to person, place, and time. Mental status is at baseline.     Comments: tremulous  Psychiatric:        Mood and Affect: Mood normal.        Behavior: Behavior normal.        Thought Content: Thought content normal.        Judgment: Judgment normal.     Results for orders placed or performed in visit on 01/05/20  Microscopic Examination   BLD  Result Value Ref Range   WBC, UA 0-5 0 - 5 /hpf   RBC None seen 0 - 2 /hpf   Epithelial Cells (non renal) 0-10 0 - 10 /hpf   Bacteria, UA None seen  None seen/Few  Urine Culture, Reflex   BLD  Result Value Ref Range   Urine Culture, Routine Final report    Organism ID, Bacteria Comment   Hepatitis C Genotype  Result Value Ref Range   Hepatitis C Genotype 2b    Please Note (HCV): Comment   Ferritin  Result Value Ref Range   Ferritin 105 15 - 150 ng/mL  VITAMIN D 25 Hydroxy (Vit-D Deficiency, Fractures)  Result Value Ref Range   Vit D, 25-Hydroxy 8.8 (L) 30.0 - 100.0 ng/mL  UA/M w/rflx Culture, Routine   Specimen: Blood   BLD  Result Value Ref Range   Specific Gravity, UA 1.010 1.005 - 1.030   pH, UA 7.0 5.0 - 7.5   Color, UA Yellow Yellow   Appearance Ur Clear Clear   Leukocytes,UA 1+ (A) Negative   Protein,UA Negative Negative/Trace   Glucose, UA Negative Negative   Ketones, UA Negative Negative   RBC, UA Negative Negative   Bilirubin, UA Negative Negative   Urobilinogen, Ur 0.2 0.2 - 1.0 mg/dL   Nitrite, UA Negative Negative   Microscopic Examination See below:    Urinalysis Reflex Comment   TSH  Result Value Ref Range   TSH 3.260 0.450 - 4.500 uIU/mL  Lipid Panel w/o Chol/HDL Ratio  Result Value Ref Range   Cholesterol, Total 179 100 - 199 mg/dL   Triglycerides 63 0 - 149 mg/dL   HDL 95 >39 mg/dL   VLDL Cholesterol Cal 12 5 - 40 mg/dL   LDL Chol Calc (NIH) 72 0 - 99 mg/dL  Iron and TIBC  Result Value Ref Range   Total Iron Binding Capacity 330 250 - 450 ug/dL   UIBC 247 118 - 369 ug/dL   Iron 83 27 - 139 ug/dL   Iron Saturation 25 15 - 55 %  HIV Antibody (routine testing w rflx)  Result Value Ref Range   HIV Screen 4th Generation wRfx Non Reactive Non Reactive  Comprehensive metabolic panel  Result Value Ref Range   Glucose 108 (H) 65 - 99 mg/dL   BUN 10 8 - 27 mg/dL   Creatinine, Ser 0.65 0.57 - 1.00 mg/dL   GFR calc non Af Amer 92 >59 mL/min/1.73   GFR calc Af Amer 105 >59 mL/min/1.73   BUN/Creatinine Ratio 15 12 - 28   Sodium 140 134 - 144 mmol/L   Potassium 4.4 3.5 - 5.2 mmol/L   Chloride 99 96  - 106 mmol/L  CO2 26 20 - 29 mmol/L   Calcium 9.6 8.7 - 10.3 mg/dL   Total Protein 7.4 6.0 - 8.5 g/dL   Albumin 4.5 3.8 - 4.8 g/dL   Globulin, Total 2.9 1.5 - 4.5 g/dL   Albumin/Globulin Ratio 1.6 1.2 - 2.2   Bilirubin Total 0.5 0.0 - 1.2 mg/dL   Alkaline Phosphatase 91 39 - 117 IU/L   AST 34 0 - 40 IU/L   ALT 14 0 - 32 IU/L  CBC with Differential/Platelet  Result Value Ref Range   WBC 5.7 3.4 - 10.8 x10E3/uL   RBC 3.81 3.77 - 5.28 x10E6/uL   Hemoglobin 13.0 11.1 - 15.9 g/dL   Hematocrit 53.9 76.7 - 46.6 %   MCV 98 (H) 79 - 97 fL   MCH 34.1 (H) 26.6 - 33.0 pg   MCHC 34.9 31.5 - 35.7 g/dL   RDW 34.1 93.7 - 90.2 %   Platelets 182 150 - 450 x10E3/uL   Neutrophils 45 Not Estab. %   Lymphs 35 Not Estab. %   Monocytes 11 Not Estab. %   Eos 8 Not Estab. %   Basos 1 Not Estab. %   Neutrophils Absolute 2.6 1.4 - 7.0 x10E3/uL   Lymphocytes Absolute 2.0 0.7 - 3.1 x10E3/uL   Monocytes Absolute 0.6 0.1 - 0.9 x10E3/uL   EOS (ABSOLUTE) 0.5 (H) 0.0 - 0.4 x10E3/uL   Basophils Absolute 0.0 0.0 - 0.2 x10E3/uL   Immature Granulocytes 0 Not Estab. %   Immature Grans (Abs) 0.0 0.0 - 0.1 x10E3/uL  Bayer DCA Hb A1c Waived  Result Value Ref Range   HB A1C (BAYER DCA - WAIVED) 4.8 <7.0 %  HCV RNA quant  Result Value Ref Range   Hepatitis C Quantitation 8,850,000 IU/mL   HCV log10 6.947 log10 IU/mL   Test Information Comment       Assessment & Plan:   Problem List Items Addressed This Visit      Digestive   Chronic hepatitis C without hepatic coma (HCC) - Primary    Has not seen GI yet. Will reschedule her appointment. Will get CCM involved to help with transportation to get her there. Continue to monitor. Call with any concerns.       Relevant Orders   Referral to Chronic Care Management Services     Other   Anxiety disorder    Not doing great. Will get her hooked up with CCM team to see if we can help. Continue to monitor.           Follow up plan: Return in about 4 weeks  (around 04/01/2020).

## 2020-03-04 NOTE — Telephone Encounter (Signed)
   [facility] 03/04/2020 Name: Vanessa Oneal   MRN: [id number]   DOB: 04-13-1951   AGE: 69 y.o.   GENDER: female   PCP Olevia Perches P, DO.   Called pt regarding Community Resource Referral for transportation to see Dr. Servando Snare. Pt confirmed her information for pick up address and [facility] Medicare Will cb with reservation # once trip has been booked.    Manuela Schwartz  Care Guide . Embedded Care Coordination [facility] Management [email or web address]  [phone]

## 2020-03-04 NOTE — Assessment & Plan Note (Signed)
Not doing great. Will get her hooked up with CCM team to see if we can help. Continue to monitor.

## 2020-03-04 NOTE — Telephone Encounter (Signed)
[employer] trip booked to [address], [place], [place] [address] Reservation # [phone]

## 2020-03-04 NOTE — Patient Instructions (Addendum)
You can get your covid shot after 2 weeks because of the pneumonia shot.  We are recommending the vaccine to everyone who has not had an allergic reaction to any of the components of the vaccine. If you have specific questions about the vaccine, please bring them up with your health care provider to discuss them.   We will likely not be getting the vaccine in the office for the first rounds of vaccinations. The way they are releasing the vaccines is going to be through the health systems (like [facility], [facility], [facility], [facility]), through your county health department, or through the pharmacies.   The [facility] Department is giving vaccines to those 65+ and Health Care Workers Teachers and Child Care providers start 01/28/20, Essential workers start 3/10 and those with co-morbidities start 02/25/20 Call [phone] to schedule  If you are 65+ you can get a vaccine through [facility] by signing up for an appointment.  You can sign up by going to: [email or web address].  You can get more information by going to: [email or web address]  [employer] next door is giving the [employer]- you can call [phone] or stop by there to schedule.  Pneumococcal Vaccine, Polyvalent solution for injection What is this medicine? PNEUMOCOCCAL VACCINE, POLYVALENT (NEU mo KOK al vak SEEN, pol ee VEY luhnt) is a vaccine to prevent pneumococcus bacteria infection. These bacteria are a major cause of ear infections, Strep throat infections, and serious pneumonia, meningitis, or blood infections worldwide. These vaccines help the body to produce antibodies (protective substances) that help your body defend against these bacteria. This vaccine is recommended for people 75 years of age and older with health problems. It is also recommended for all adults over 55 years old. This vaccine will not treat an infection. This medicine may be used for other purposes; ask your health care provider or  pharmacist if you have questions. COMMON BRAND NAME(S): Pneumovax 23 What should I tell my health care provider before I take this medicine? They need to know if you have any of these conditions:  bleeding problems  bone marrow or organ transplant  cancer, Hodgkin's disease  fever  infection  immune system problems  low platelet count in the blood  seizures  an unusual or allergic reaction to pneumococcal vaccine, diphtheria toxoid, other vaccines, latex, other medicines, foods, dyes, or preservatives  pregnant or trying to get pregnant  breast-feeding How should I use this medicine? This vaccine is for injection into a muscle or under the skin. It is given by a health care professional. A copy of Vaccine Information Statements will be given before each vaccination. Read this sheet carefully each time. The sheet may change frequently. Talk to your pediatrician regarding the use of this medicine in children. While this drug may be prescribed for children as young as 82 years of age for selected conditions, precautions do apply. Overdosage: If you think you have taken too much of this medicine contact a poison control center or emergency room at once. NOTE: This medicine is only for you. Do not share this medicine with others. What if I miss a dose? It is important not to miss your dose. Call your doctor or health care professional if you are unable to keep an appointment. What may interact with this medicine?  medicines for cancer chemotherapy  medicines that suppress your immune function  medicines that treat or prevent blood clots like warfarin, enoxaparin, and dalteparin  steroid medicines like prednisone or cortisone This list  may not describe all possible interactions. Give your health care provider a list of all the medicines, herbs, non-prescription drugs, or dietary supplements you use. Also tell them if you smoke, drink alcohol, or use illegal drugs. Some items may  interact with your medicine. What should I watch for while using this medicine? Mild fever and pain should go away in 3 days or less. Report any unusual symptoms to your doctor or health care professional. What side effects may I notice from receiving this medicine? Side effects that you should report to your doctor or health care professional as soon as possible:  allergic reactions like skin rash, itching or hives, swelling of the face, lips, or tongue  breathing problems  confused  fever over 102 degrees F  pain, tingling, numbness in the hands or feet  seizures  unusual bleeding or bruising  unusual muscle weakness Side effects that usually do not require medical attention (report to your doctor or health care professional if they continue or are bothersome):  aches and pains  diarrhea  fever of 102 degrees F or less  headache  irritable  loss of appetite  pain, tender at site where injected  trouble sleeping This list may not describe all possible side effects. Call your doctor for medical advice about side effects. You may report side effects to FDA at 1-800-FDA-1088. Where should I keep my medicine? This does not apply. This vaccine is given in a clinic, pharmacy, doctor's office, or other health care setting and will not be stored at home. NOTE: This sheet is a summary. It may not cover all possible information. If you have questions about this medicine, talk to your doctor, pharmacist, or health care provider.  2020 Elsevier/Gold Standard (2008-06-26 14:32:37)

## 2020-03-10 ENCOUNTER — Ambulatory Visit (INDEPENDENT_AMBULATORY_CARE_PROVIDER_SITE_OTHER): Payer: Medicare Other | Admitting: General Practice

## 2020-03-10 ENCOUNTER — Ambulatory Visit: Payer: Medicare Other | Admitting: Gastroenterology

## 2020-03-10 DIAGNOSIS — I2510 Atherosclerotic heart disease of native coronary artery without angina pectoris: Secondary | ICD-10-CM

## 2020-03-10 DIAGNOSIS — F419 Anxiety disorder, unspecified: Secondary | ICD-10-CM

## 2020-03-10 DIAGNOSIS — B182 Chronic viral hepatitis C: Secondary | ICD-10-CM

## 2020-03-10 DIAGNOSIS — J439 Emphysema, unspecified: Secondary | ICD-10-CM

## 2020-03-10 NOTE — Patient Instructions (Signed)
Visit Information  Goals Addressed            This Visit's Progress   . RNCM: Pt-"I want to take better care of myself" (pt-stated)       CARE PLAN ENTRY (see longtitudinal plan of care for additional care plan information)  Current Barriers:  . Chronic Disease Management support, education, and care coordination needs related to CAD, Anxiety, and Pulmonary Disease  Clinical Goal(s) related to CAD, Anxiety, and Pulmonary Disease:  Over the next 120 days, patient will:  . Work with the care management team to address educational, disease management, and care coordination needs  . Begin or continue self health monitoring activities as directed today  adhere to a heart healthy diet and utilize effective coping mechanisms for anxiety and stress . Call provider office for new or worsened signs and symptoms Shortness of breath and New or worsened symptom related to CAD and Anxiety . Call care management team with questions or concerns . Verbalize basic understanding of patient centered plan of care established today  Interventions related to CAD, Anxiety, and Pulmonary Disease:  . Evaluation of current treatment plans and patient's adherence to plan as established by provider . Assessed patient understanding of disease states . Assessed patient's education and care coordination needs . Provided disease specific education to patient  . Collaborated with appropriate clinical care team members regarding patient needs  Patient Self Care Activities related to CAD, Anxiety, and Pulmonary Disease:  . Patient is unable to independently self-manage chronic health conditions  Initial goal documentation     . RNCM: Pt-"They called yesterday and changed my appointment" (pt-stated)       [place] (see longtitudinal plan of care for additional care plan information)  Current Barriers:  [name] Knowledge Deficits related to Hepatitis C and the importance of continued care and treatment for  Hepatitis C . Film/video editor.  . Transportation barriers . Non-adherence to scheduled provider appointments  Nurse Case Manager Clinical Goal(s):  [name] Over the next 120 days, patient will verbalize understanding of plan for Hepatitis C and management of Hepatitis C . Over the next 120 days, patient will work with RNCM, pcp, and CCM team to address needs related to Hepatitis C . Over the next 120 days, patient will demonstrate a decrease in Hepatitis C exacerbations as evidenced by attending GI appointments and following plan of care for Hepatitis C . Over the next 120 days, patient will attend all scheduled medical appointments: the patients appointment with Dr. Lucilla Lame was rescheduled from 03/10/2020 to 03/11/2020 at 130pm.  . Over the next 120 days, patient will verbalize basic understanding of Hepatitis C disease process and self health management plan as evidenced by compliance with prescribed treatment plan . Over the next 120 days, patient will work with CM clinical social worker to help with anxiety and depression . Over the next 120 days, patient will work with care guides  to help with food resources such as [employer]  . Over the next 120 days, the patient will demonstrate ongoing self health care management ability as evidenced by compliance with medication regimen and plan of care for management of Hepatitis C  Interventions:  . Evaluation of current treatment plan related to Hepatitis C and patient's adherence to plan as established by provider. . Advised patient to call for changes in appointments or transportation needs  . Provided education to patient re: Hepatitis C, taking medications with her to her appointment and writing down  questions to ask the provider . Collaborated with Dr. Allen Norris office  regarding patients appointment on 03/11/2020. Directions given to come to [address], [address], [place], [place] [address].  The patient verbalized her friend is taking her  to the appointment tomorrow. Education on the importance of going to the appointment as scheduled. The patient confirmed she is going to her appointment.  Her niece had a talk with her and told her how important it was for her to go also.  [name] Discussed plans with patient for ongoing care management follow up and provided patient with direct contact information for care management team . Reviewed scheduled/upcoming provider appointments including: Appointment with Dr. Lucilla Lame, GI specialist on 03/11/2020 at 1:30 pm . Care Guide referral for food resources in the community . Social Work referral for patient need for coping mechanisms. Patient tearful during the call but thankful for the support of the CCM team.   Patient Self Care Activities:  . Patient verbalizes understanding of plan to manage Hepatitis C by attending all appointments and following the plan of care established by the provider . Self administers medications as prescribed . Attends all scheduled provider appointments . Calls provider office for new concerns or questions . Unable to independently manage Hepatitis C as evidence of multiple missed appointments  . Does not attend all scheduled provider appointments  Initial goal documentation        Ms. Didonato was given information about Chronic Care Management services today including:  1. CCM service includes personalized support from designated clinical staff supervised by her physician, including individualized plan of care and coordination with other care providers 2. 24/7 contact phone numbers for assistance for urgent and routine care needs. 3. Service will only be billed when office clinical staff spend 20 minutes or more in a month to coordinate care. 4. Only one practitioner may furnish and bill the service in a calendar month. 5. The patient may stop CCM services at any time (effective at the end of the month) by phone call to the office staff. 6. The patient will be  responsible for cost sharing (co-pay) of up to 20% of the service fee (after annual deductible is met).  Patient agreed to services and verbal consent obtained.   Patient verbalizes understanding of instructions provided today.   The care management team will reach out to the patient again over the next 30 to 60 days.   Noreene Larsson RN, MSN, [facility] Mobile: [phone]

## 2020-03-10 NOTE — Chronic Care Management (AMB) (Signed)
Chronic Care Management   Initial Visit Note  03/10/2020 Name: Vanessa Oneal MRN: [id number] DOB: Feb 10, 1951  Referred by: Valerie Roys, DO Reason for referral : Chronic Care Management (Initial Outreach: RNCM Chronic Disease Management and Care Coordination Needs)   Vanessa Oneal is a 69 y.o. year old female who is a primary care patient of Valerie Roys, DO. The CCM team was consulted for assistance with chronic disease management and care coordination needs related to CAD, Anxiety, Pulmonary Disease and Hepatitis C  Review of patient status, including review of consultants reports, relevant laboratory and other test results, and collaboration with appropriate care team members and the patient's provider was performed as part of comprehensive patient evaluation and provision of chronic care management services.    SDOH (Social Determinants of Health) assessments performed: Yes See Care Plan activities for detailed interventions related to SDOH  SDOH Interventions     Most Recent Value  SDOH Interventions  SDOH Interventions for the Following Domains  Depression, Physical Activity, Stress, Social Connections, Tobacco, Transportation  Physical Activity Interventions  Other (Comments) [is not very active, wants to improve her health and well being]  Stress Interventions  Provide Counseling, Other (Comment) [LCSW referral pending, resources given]  Social Connections Interventions  Other (Comment) [has a good support system, loves her family]  Tobacco Interventions  Other (Comment) [education and support, the patient is trying to cut back]  Transportation Interventions  Other (Comment) [resources through care guide. The patient has adequate transportation at this time]  Depression Interventions/Treatment   Counseling [LCSW referral pending]       Medications: Outpatient Encounter Medications as of 03/10/2020  Medication Sig  . SUBOXONE 8-2 MG FILM Take 1 Film by mouth 2 (two)  times daily.   . Vitamin D, Ergocalciferol, (DRISDOL) 1.25 MG (50000 UNIT) CAPS capsule Take 1 capsule (50,000 Units total) by mouth every 7 (seven) days.   No facility-administered encounter medications on file as of 03/10/2020.     Objective:   Goals Addressed            This Visit's Progress   . RNCM: Pt-"I want to take better care of myself" (pt-stated)       CARE PLAN ENTRY (see longtitudinal plan of care for additional care plan information)  Current Barriers:  . Chronic Disease Management support, education, and care coordination needs related to CAD, Anxiety, and Pulmonary Disease  Clinical Goal(s) related to CAD, Anxiety, and Pulmonary Disease:  Over the next 120 days, patient will:  . Work with the care management team to address educational, disease management, and care coordination needs  . Begin or continue self health monitoring activities as directed today  adhere to a heart healthy diet and utilize effective coping mechanisms for anxiety and stress . Call provider office for new or worsened signs and symptoms Shortness of breath and New or worsened symptom related to CAD and Anxiety . Call care management team with questions or concerns . Verbalize basic understanding of patient centered plan of care established today  Interventions related to CAD, Anxiety, and Pulmonary Disease:  . Evaluation of current treatment plans and patient's adherence to plan as established by provider . Assessed patient understanding of disease states . Assessed patient's education and care coordination needs . Provided disease specific education to patient  . Collaborated with appropriate clinical care team members regarding patient needs  Patient Self Care Activities related to CAD, Anxiety, and Pulmonary Disease:  . Patient is unable to  independently self-manage chronic health conditions  Initial goal documentation     . RNCM: Pt-"They called yesterday and changed my appointment"  (pt-stated)       [place] (see longtitudinal plan of care for additional care plan information)  Current Barriers:  [name] Knowledge Deficits related to Hepatitis C and the importance of continued care and treatment for Hepatitis C . Film/video editor.  . Transportation barriers . Non-adherence to scheduled provider appointments  Nurse Case Manager Clinical Goal(s):  [name] Over the next 120 days, patient will verbalize understanding of plan for Hepatitis C and management of Hepatitis C . Over the next 120 days, patient will work with RNCM, pcp, and CCM team to address needs related to Hepatitis C . Over the next 120 days, patient will demonstrate a decrease in Hepatitis C exacerbations as evidenced by attending GI appointments and following plan of care for Hepatitis C . Over the next 120 days, patient will attend all scheduled medical appointments: the patients appointment with Dr. Lucilla Lame was rescheduled from 03/10/2020 to 03/11/2020 at 130pm.  . Over the next 120 days, patient will verbalize basic understanding of Hepatitis C disease process and self health management plan as evidenced by compliance with prescribed treatment plan . Over the next 120 days, patient will work with CM clinical social worker to help with anxiety and depression . Over the next 120 days, patient will work with care guides  to help with food resources such as [employer]  . Over the next 120 days, the patient will demonstrate ongoing self health care management ability as evidenced by compliance with medication regimen and plan of care for management of Hepatitis C  Interventions:  . Evaluation of current treatment plan related to Hepatitis C and patient's adherence to plan as established by provider. . Advised patient to call for changes in appointments or transportation needs  . Provided education to patient re: Hepatitis C, taking medications with her to her appointment and writing down questions to  ask the provider . Collaborated with Dr. Allen Norris office  regarding patients appointment on 03/11/2020. Directions given to come to [address], [address], [place], [place] [address].  The patient verbalized her friend is taking her to the appointment tomorrow. Education on the importance of going to the appointment as scheduled. The patient confirmed she is going to her appointment.  Her niece had a talk with her and told her how important it was for her to go also.  [name] Discussed plans with patient for ongoing care management follow up and provided patient with direct contact information for care management team . Reviewed scheduled/upcoming provider appointments including: Appointment with Dr. Lucilla Lame, GI specialist on 03/11/2020 at 1:30 pm . Care Guide referral for food resources in the community . Social Work referral for patient need for coping mechanisms. Patient tearful during the call but thankful for the support of the CCM team.   Patient Self Care Activities:  . Patient verbalizes understanding of plan to manage Hepatitis C by attending all appointments and following the plan of care established by the provider . Self administers medications as prescribed . Attends all scheduled provider appointments . Calls provider office for new concerns or questions . Unable to independently manage Hepatitis C as evidence of multiple missed appointments  . Does not attend all scheduled provider appointments  Initial goal documentation         Ms. Boyde was given information about Chronic Care Management services today including:  1. CCM service includes personalized support from designated clinical staff supervised by her physician, including individualized plan of care and coordination with other care providers 2. 24/7 contact phone numbers for assistance for urgent and routine care needs. 3. Service will only be billed when office clinical staff spend 20 minutes or more in a month to coordinate  care. 4. Only one practitioner may furnish and bill the service in a calendar month. 5. The patient may stop CCM services at any time (effective at the end of the month) by phone call to the office staff. 6. The patient will be responsible for cost sharing (co-pay) of up to 20% of the service fee (after annual deductible is met).  Patient agreed to services and verbal consent obtained.   Plan:   The care management team will reach out to the patient again over the next 30 to 60 days.   Noreene Larsson RN, MSN, [facility] Mobile: [phone]

## 2020-03-11 ENCOUNTER — Ambulatory Visit (INDEPENDENT_AMBULATORY_CARE_PROVIDER_SITE_OTHER): Payer: Medicare Other | Admitting: Gastroenterology

## 2020-03-11 ENCOUNTER — Other Ambulatory Visit: Payer: Self-pay

## 2020-03-11 ENCOUNTER — Encounter: Payer: Self-pay | Admitting: Gastroenterology

## 2020-03-11 VITALS — BP 108/67 | HR 106 | Temp 97.9°F | Ht <= 58 in | Wt 86.8 lb

## 2020-03-11 DIAGNOSIS — B182 Chronic viral hepatitis C: Secondary | ICD-10-CM | POA: Diagnosis not present

## 2020-03-11 NOTE — Progress Notes (Addendum)
Gastroenterology Consultation  Referring Provider:     Dorcas Carrow, DO Primary Care Physician:  Dorcas Carrow, DO Primary Gastroenterologist:  Dr. Servando Snare     Reason for Consultation:     Chronic hepatitis C        HPI:   Vanessa Oneal is a 69 y.o. y/o female referred for consultation & management of chronic hepatitis C by Dr. Laural Benes, Vanessa P, DO.  This patient comes in today with a history of having hepatitis C.  The patient had a viral load of 8,800,000 international units/mL 2 months ago with her level being 4,500,000 international units/mL 2 years ago.  The patient had a genotype checked which showed her to be genotype 2B.  Her most recent liver enzymes did not show any abnormalities.  The patient's hepatitis B antibody was also positive indicating immunity.  The patient does not appear to have had any imaging of her liver. The patient denies that she knew of her diagnosis of hepatitis C but is now willing to undergo treatment for it.  There is no report of any jaundice or abdominal pain.  She does report that she has done IV drugs many years ago and also has a tattoo that was done while she was incarcerated.  She also reports snorting cocaine in the past.  There is no report of any blood transfusions in the past.  Past Medical History:  Diagnosis Date  . Arthritis    RA  . Depression   . Fibromyalgia   . GERD (gastroesophageal reflux disease)   . H/O drug dependence (HCC)    opiods  . Wears dentures    full upper    Past Surgical History:  Procedure Laterality Date  . BREAST SURGERY     Silicone implants, then removal  . CATARACT EXTRACTION W/PHACO Left 05/06/2019   Procedure: CATARACT EXTRACTION PHACO AND INTRAOCULAR LENS PLACEMENT (IOC) LEFT;  Surgeon: Galen Manila, MD;  Location: [facility];  Service: Ophthalmology;  Laterality: Left;  . TUBAL LIGATION      Prior to Admission medications   Medication Sig Start Date End Date Taking? Authorizing  Provider  SUBOXONE 8-2 MG FILM Take 1 Film by mouth 2 (two) times daily.  10/31/17   [provider]  Vitamin D, Ergocalciferol, (DRISDOL) 1.25 MG (50000 UNIT) CAPS capsule Take 1 capsule (50,000 Units total) by mouth every 7 (seven) days. 01/12/20   Dorcas Carrow, DO    Family History  Problem Relation Age of Onset  . Suicidality Father   . COPD Sister      Social History   Tobacco Use  . Smoking status: Light Tobacco Smoker    Packs/day: 0.25    Years: 56.00    Pack years: 14.00    Types: Cigarettes    Last attempt to quit: 11/04/2019    Years since quitting: 0.3  . Smokeless tobacco: Never Used  . Tobacco comment: 1 pack with last a couple days   Substance Use Topics  . Alcohol use: Yes    Alcohol/week: 2.0 standard drinks    Types: 2 Cans of beer per week    Comment: couple of beers a week   . Drug use: No    Allergies as of 03/11/2020 - Review Complete 03/04/2020  Allergen Reaction Noted  . Gabapentin Anaphylaxis 11/19/2017    Review of Systems:    All systems reviewed and negative except where noted in HPI.   Physical Exam:  There  were no vitals taken for this visit. No LMP recorded. Patient is postmenopausal. General:   Alert,  Well-developed, well-nourished, pleasant and cooperative in NAD Head:  Normocephalic and atraumatic. Eyes:  Sclera clear, no icterus.   Conjunctiva pink. Ears:  Normal auditory acuity. Neck:  Supple; no masses or thyromegaly. Lungs:  Respirations even and unlabored.  Clear throughout to auscultation.   No wheezes, crackles, or rhonchi. No acute distress. Heart:  Regular rate and rhythm; no murmurs, clicks, rubs, or gallops. Abdomen:  Normal bowel sounds.  No bruits.  Soft, non-tender and non-distended without masses, hepatosplenomegaly or hernias noted.  No guarding or rebound tenderness.  Negative Carnett sign.   Rectal:  Deferred.  Pulses:  Normal pulses noted. Extremities:  No clubbing or edema.  No cyanosis. Neurologic:   Alert and oriented x3;  grossly normal neurologically. Skin:  Intact without significant lesions or rashes.  No jaundice. Lymph Nodes:  No significant cervical adenopathy. Psych:  Alert and cooperative. Normal mood and affect.  Imaging Studies: No results found.  Assessment and Plan:   Vanessa Oneal is a 69 y.o. y/o female who comes in with a diagnosis of hepatitis C.  The patient's viral load was 8.8 million international units per milliliter and her genotype was to be.  The patient will have labs sent off in preparation for treatment for hepatitis C.  The patient will be started on hepatitis each treatment once her labs are back.  The patient has also reported that she has never had a colonoscopy and will be set up for screening colonoscopy. The patient has been explained the plan and agrees with it.    Vanessa Lame, MD. Marval Regal    Note: This dictation was prepared with Dragon dictation along with smaller phrase technology. Any transcriptional errors that result from this process are unintentional.

## 2020-03-13 LAB — HCV FIBROSURE
ALPHA 2-MACROGLOBULINS, QN: 342 mg/dL — ABNORMAL HIGH (ref 110–276)
ALT (SGPT) P5P: 14 IU/L (ref 0–40)
Apolipoprotein A-1: 169 mg/dL (ref 116–209)
Bilirubin, Total: 0.2 mg/dL (ref 0.0–1.2)
Fibrosis Score: 0.26 — ABNORMAL HIGH (ref 0.00–0.21)
GGT: 14 IU/L (ref 0–60)
Haptoglobin: 85 mg/dL (ref 37–355)
Necroinflammat Activity Score: 0.04 (ref 0.00–0.17)

## 2020-03-13 LAB — HEPATITIS B SURFACE ANTIBODY,QUALITATIVE: Hep B Surface Ab, Qual: REACTIVE

## 2020-03-13 LAB — ANA: Anti Nuclear Antibody (ANA): NEGATIVE

## 2020-03-13 LAB — HEPATITIS A ANTIBODY, TOTAL: hep A Total Ab: NEGATIVE

## 2020-03-13 LAB — MITOCHONDRIAL ANTIBODIES: Mitochondrial Ab: <20 Units (ref 0.0–20.0)

## 2020-03-13 LAB — ALPHA-1-ANTITRYPSIN: A-1 Antitrypsin: 135 mg/dL (ref 101–187)

## 2020-03-13 LAB — CERULOPLASMIN: Ceruloplasmin: 25.6 mg/dL (ref 19.0–39.0)

## 2020-03-13 LAB — HEPATITIS B SURFACE ANTIGEN: Hepatitis B Surface Ag: NEGATIVE

## 2020-03-13 LAB — ANTI-SMOOTH MUSCLE ANTIBODY, IGG: Smooth Muscle Ab: 38 Units — ABNORMAL HIGH (ref 0–19)

## 2020-03-26 DIAGNOSIS — Z720 Tobacco use: Secondary | ICD-10-CM | POA: Diagnosis not present

## 2020-04-09 ENCOUNTER — Telehealth: Payer: Self-pay

## 2020-04-09 ENCOUNTER — Ambulatory Visit: Payer: Self-pay

## 2020-04-09 MED ORDER — MAVYRET 100-40 MG PO TABS: 3.0000 | ORAL_TABLET | Freq: Every day | ORAL | 1 refills | Status: DC

## 2020-04-09 NOTE — Telephone Encounter (Signed)
Pt notified of lab results. Paperwork has been completed for Hep C treatment. Advised pt she will receive a call from [facility] next week. Pt will be on 8 weeks of treatment.

## 2020-04-09 NOTE — Chronic Care Management (AMB) (Signed)
  Care Management   Follow Up Note   04/09/2020 Name: Vanessa Oneal MRN: [id number] DOB: 1951/04/05  Referred by: Dorcas Carrow, DO Reason for referral : Care Coordination   Vanessa Oneal is a 69 y.o. year old female who is a primary care patient of Dorcas Carrow, DO. The care management team was consulted for assistance with care management and care coordination needs.    Review of patient status, including review of consultants reports, relevant laboratory and other test results, and collaboration with appropriate care team members and the patient's provider was performed as part of comprehensive patient evaluation and provision of chronic care management services.    LCSW completed CCM outreach attempt today but was unable to reach patient successfully. A HIPPA compliant voice message was left encouraging patient to return call once available. LCSW rescheduled CCM SW appointment as well.  A HIPPA compliant phone message was left for the patient providing contact information and requesting a return call.   Dickie La, BSW, MSW, LCSW [facility] Care Management [place]  [facility] [email or web address] Phone: [phone]

## 2020-04-09 NOTE — Telephone Encounter (Signed)
-----   Message from Midge Minium, MD sent at 04/06/2020  1:21 PM EDT ----- This patient needs a vaccination for hepatitis A but not for hepatitis B.  The patient also had fibrosis testing that showed minimal fibrosis.  The genotype is genotype 2B.  She needs to start her treatment for her hepatitis C.

## 2020-04-23 ENCOUNTER — Telehealth: Payer: Self-pay

## 2020-04-27 ENCOUNTER — Ambulatory Visit: Payer: Self-pay | Admitting: General Practice

## 2020-04-27 ENCOUNTER — Telehealth: Payer: Self-pay

## 2020-04-27 NOTE — Chronic Care Management (AMB) (Signed)
  Chronic Care Management   Outreach Note  04/27/2020 Name: Vanessa Oneal MRN: [id number] DOB: 12-05-1950  Referred by: Dorcas Carrow, DO Reason for referral : Chronic Care Management (CAD/Hepatitis C/Anxiety- GI follow up, new concerns)   An unsuccessful telephone outreach was attempted today. The patient was referred to the case management team for assistance with care management and care coordination.   Follow Up Plan: A HIPPA compliant phone message was left for the patient providing contact information and requesting a return call.   Alto Denver RN, MSN, CCM Community Care Coordinator [place]  [facility] Mobile: [phone]

## 2020-05-01 DIAGNOSIS — Z720 Tobacco use: Secondary | ICD-10-CM | POA: Diagnosis not present

## 2020-05-27 DIAGNOSIS — Z7189 Other specified counseling: Secondary | ICD-10-CM | POA: Diagnosis not present

## 2020-05-27 DIAGNOSIS — Z23 Encounter for immunization: Secondary | ICD-10-CM | POA: Diagnosis not present

## 2020-05-28 DIAGNOSIS — Z720 Tobacco use: Secondary | ICD-10-CM | POA: Diagnosis not present

## 2020-06-09 ENCOUNTER — Telehealth: Payer: Self-pay | Admitting: General Practice

## 2020-06-09 ENCOUNTER — Ambulatory Visit (INDEPENDENT_AMBULATORY_CARE_PROVIDER_SITE_OTHER): Payer: Medicare Other | Admitting: General Practice

## 2020-06-09 DIAGNOSIS — I2510 Atherosclerotic heart disease of native coronary artery without angina pectoris: Secondary | ICD-10-CM

## 2020-06-09 DIAGNOSIS — B182 Chronic viral hepatitis C: Secondary | ICD-10-CM

## 2020-06-09 DIAGNOSIS — J439 Emphysema, unspecified: Secondary | ICD-10-CM

## 2020-06-09 DIAGNOSIS — F419 Anxiety disorder, unspecified: Secondary | ICD-10-CM

## 2020-06-09 NOTE — Patient Instructions (Signed)
Visit Information  Goals Addressed              This Visit's Progress   .  RNCM: Pt-"I want to take better care of myself" (pt-stated)        CARE PLAN ENTRY (see longtitudinal plan of care for additional care plan information)  Current Barriers:  . Chronic Disease Management support, education, and care coordination needs related to CAD, Anxiety, and Pulmonary Disease  Clinical Goal(s) related to CAD, Anxiety, and Pulmonary Disease:  Over the next 120 days, patient will:  . Work with the care management team to address educational, disease management, and care coordination needs  . Begin or continue self health monitoring activities as directed today  adhere to a heart healthy diet and utilize effective coping mechanisms for anxiety and stress . Call provider office for new or worsened signs and symptoms Shortness of breath and New or worsened symptom related to CAD and Anxiety . Call care management team with questions or concerns . Verbalize basic understanding of patient centered plan of care established today  Interventions related to CAD, Anxiety, and Pulmonary Disease:  . Evaluation of current treatment plans and patient's adherence to plan as established by provider.  The patient states that she wants to take care of herself and she wants to follow the instructions of her doctors. She is very forgetful and needs redirection at times. Education and support.  . Assessed patient understanding of disease states.  The patient has had issues with depression and anxiety recently as her only sibling passed away unexpectedly on 2023/05/23.  The patient states she has been having a hard time but is thankful for her niece being there with her.  . Assessed patient's education and care coordination needs.  Continued education and support on chronic disease processes and needs of the patient. The patient is thankful for the CCM team working with her.  . Provided disease specific education to  patient.  Education on follow up with GI MD for recommendations on managing her Hepatitis C and follow up with pcp for chronic disease management  . Collaborated with appropriate clinical care team members regarding patient needs.  Will let the LCSW know that the patients sister passed and the hard time she has been having. . Evaluation of upcoming appointments. The patient was calling the office today to get and appointment to see pcp.   Patient Self Care Activities related to CAD, Anxiety, and Pulmonary Disease:  . Patient is unable to independently self-manage chronic health conditions  Please see past updates related to this goal by clicking on the "Past Updates" button in the selected goal      .  RNCM: Pt-"They called yesterday and changed my appointment" (pt-stated)        CARE PLAN ENTRY (see longtitudinal plan of care for additional care plan information)  Current Barriers:  [name] Knowledge Deficits related to Hepatitis C and the importance of continued care and treatment for Hepatitis C . Corporate treasurer.  . Transportation barriers . Non-adherence to scheduled provider appointments  Nurse Case Manager Clinical Goal(s):  [name] Over the next 120 days, patient will verbalize understanding of plan for Hepatitis C and management of Hepatitis C . Over the next 120 days, patient will work with RNCM, pcp, and CCM team to address needs related to Hepatitis C . Over the next 120 days, patient will demonstrate a decrease in Hepatitis C exacerbations as evidenced by attending GI appointments and following plan of care  for Hepatitis C . Over the next 120 days, patient will attend all scheduled medical appointments: the patients appointment with Dr. Midge Minium was rescheduled from 03/10/2020 to 03/11/2020 at 130pm.  . Over the next 120 days, patient will verbalize basic understanding of Hepatitis C disease process and self health management plan as evidenced by compliance with prescribed treatment  plan . Over the next 120 days, patient will work with CM clinical social worker to help with anxiety and depression . Over the next 120 days, patient will work with care guides  to help with food resources such as [employer]  . Over the next 120 days, the patient will demonstrate ongoing self health care management ability as evidenced by compliance with medication regimen and plan of care for management of Hepatitis C  Interventions:  . Evaluation of current treatment plan related to Hepatitis C and patient's adherence to plan as established by provider. . Advised patient to call for changes in appointments or transportation needs  . Provided education to patient re: Hepatitis C, taking medications with her to her appointment and writing down questions to ask the provider . Collaborated with Dr. Servando Snare office  regarding patients appointment on 03/11/2020. Directions given to come to [address], [address], [place], [place] [address].  The patient verbalized her friend is taking her to the appointment tomorrow. Education on the importance of going to the appointment as scheduled. The patient confirmed she is going to her appointment.  Her niece had a talk with her and told her how important it was for her to go also. 06-09-2020: the patient went to her appointment. She says they want to treat her for her liver. She said they were supposed to send her paperwork.  She has not heard from them. Recommended she call their office and talk to the office about follow up.  . Discussed plans with patient for ongoing care management follow up and provided patient with direct contact information for care management team . Reviewed scheduled/upcoming provider appointments including: Appointment with Dr. Midge Minium, GI specialist on 03/11/2020 at 1:30 pm.  This is completed.  . Care Guide referral for food resources in the community . Social Work referral for patient need for coping mechanisms. Patient tearful  during the call but thankful for the support of the CCM team.   Patient Self Care Activities:  . Patient verbalizes understanding of plan to manage Hepatitis C by attending all appointments and following the plan of care established by the provider . Self administers medications as prescribed . Attends all scheduled provider appointments . Calls provider office for new concerns or questions . Unable to independently manage Hepatitis C as evidence of multiple missed appointments  . Does not attend all scheduled provider appointments  Please see past updates related to this goal by clicking on the "Past Updates" button in the selected goal         Patient verbalizes understanding of instructions provided today.   The care management team will reach out to the patient again over the next 30 to 60 days.   Alto Denver RN, MSN, CCM Community Care Coordinator [place]  [facility] Mobile: [phone]

## 2020-06-09 NOTE — Chronic Care Management (AMB) (Signed)
Chronic Care Management   Follow Up Note   06/09/2020 Name: Vanessa Oneal MRN: [id number] DOB: 1951-10-10  Referred by: Dorcas Carrow, DO Reason for referral : Chronic Care Management (Follow up: RNCM Chronic Disease Management and Care Coordination Needs)   Vanessa Oneal is a 69 y.o. year old female who is a primary care patient of Dorcas Carrow, DO. The CCM team was consulted for assistance with chronic disease management and care coordination needs.    Review of patient status, including review of consultants reports, relevant laboratory and other test results, and collaboration with appropriate care team members and the patient's provider was performed as part of comprehensive patient evaluation and provision of chronic care management services.    SDOH (Social Determinants of Health) assessments performed: Yes See Care Plan activities for detailed interventions related to [facility])     Outpatient Encounter Medications as of 06/09/2020  Medication Sig  . Glecaprevir-Pibrentasvir (MAVYRET) 100-40 MG TABS Take 3 tablets by mouth daily.  . SUBOXONE 8-2 MG FILM Take 1 Film by mouth 2 (two) times daily.   . Vitamin D, Ergocalciferol, (DRISDOL) 1.25 MG (50000 UNIT) CAPS capsule Take 1 capsule (50,000 Units total) by mouth every 7 (seven) days.   No facility-administered encounter medications on file as of 06/09/2020.     Objective:   Goals Addressed              This Visit's Progress   .  RNCM: Pt-"I want to take better care of myself" (pt-stated)        CARE PLAN ENTRY (see longtitudinal plan of care for additional care plan information)  Current Barriers:  . Chronic Disease Management support, education, and care coordination needs related to CAD, Anxiety, and Pulmonary Disease  Clinical Goal(s) related to CAD, Anxiety, and Pulmonary Disease:  Over the next 120 days, patient will:  . Work with the care management team to address educational, disease management, and  care coordination needs  . Begin or continue self health monitoring activities as directed today  adhere to a heart healthy diet and utilize effective coping mechanisms for anxiety and stress . Call provider office for new or worsened signs and symptoms Shortness of breath and New or worsened symptom related to CAD and Anxiety . Call care management team with questions or concerns . Verbalize basic understanding of patient centered plan of care established today  Interventions related to CAD, Anxiety, and Pulmonary Disease:  . Evaluation of current treatment plans and patient's adherence to plan as established by provider.  The patient states that she wants to take care of herself and she wants to follow the instructions of her doctors. She is very forgetful and needs redirection at times. Education and support.  . Assessed patient understanding of disease states.  The patient has had issues with depression and anxiety recently as her only sibling passed away unexpectedly on 2023/05/05.  The patient states she has been having a hard time but is thankful for her niece being there with her.  . Assessed patient's education and care coordination needs.  Continued education and support on chronic disease processes and needs of the patient. The patient is thankful for the CCM team working with her.  . Provided disease specific education to patient.  Education on follow up with GI MD for recommendations on managing her Hepatitis C and follow up with pcp for chronic disease management  . Collaborated with appropriate clinical care team members regarding patient needs.  Will let the LCSW know that the patients sister passed and the hard time she has been having. . Evaluation of upcoming appointments. The patient was calling the office today to get and appointment to see pcp.   Patient Self Care Activities related to CAD, Anxiety, and Pulmonary Disease:  . Patient is unable to independently self-manage chronic  health conditions  Please see past updates related to this goal by clicking on the "Past Updates" button in the selected goal      .  RNCM: Pt-"They called yesterday and changed my appointment" (pt-stated)        CARE PLAN ENTRY (see longtitudinal plan of care for additional care plan information)  Current Barriers:  [name] Knowledge Deficits related to Hepatitis C and the importance of continued care and treatment for Hepatitis C . Corporate treasurer.  . Transportation barriers . Non-adherence to scheduled provider appointments  Nurse Case Manager Clinical Goal(s):  [name] Over the next 120 days, patient will verbalize understanding of plan for Hepatitis C and management of Hepatitis C . Over the next 120 days, patient will work with RNCM, pcp, and CCM team to address needs related to Hepatitis C . Over the next 120 days, patient will demonstrate a decrease in Hepatitis C exacerbations as evidenced by attending GI appointments and following plan of care for Hepatitis C . Over the next 120 days, patient will attend all scheduled medical appointments: the patients appointment with Dr. Midge Minium was rescheduled from 03/10/2020 to 03/11/2020 at 130pm.  . Over the next 120 days, patient will verbalize basic understanding of Hepatitis C disease process and self health management plan as evidenced by compliance with prescribed treatment plan . Over the next 120 days, patient will work with CM clinical social worker to help with anxiety and depression . Over the next 120 days, patient will work with care guides  to help with food resources such as [employer]  . Over the next 120 days, the patient will demonstrate ongoing self health care management ability as evidenced by compliance with medication regimen and plan of care for management of Hepatitis C  Interventions:  . Evaluation of current treatment plan related to Hepatitis C and patient's adherence to plan as established by  provider. . Advised patient to call for changes in appointments or transportation needs  . Provided education to patient re: Hepatitis C, taking medications with her to her appointment and writing down questions to ask the provider . Collaborated with Dr. Servando Snare office  regarding patients appointment on 03/11/2020. Directions given to come to [address], [address], [place], [place] [address].  The patient verbalized her friend is taking her to the appointment tomorrow. Education on the importance of going to the appointment as scheduled. The patient confirmed she is going to her appointment.  Her niece had a talk with her and told her how important it was for her to go also. 06-09-2020: the patient went to her appointment. She says they want to treat her for her liver. She said they were supposed to send her paperwork.  She has not heard from them. Recommended she call their office and talk to the office about follow up.  . Discussed plans with patient for ongoing care management follow up and provided patient with direct contact information for care management team . Reviewed scheduled/upcoming provider appointments including: Appointment with Dr. Midge Minium, GI specialist on 03/11/2020 at 1:30 pm.  This is completed.  . Care Guide referral for food  resources in the community . Social Work referral for patient need for coping mechanisms. Patient tearful during the call but thankful for the support of the CCM team.   Patient Self Care Activities:  . Patient verbalizes understanding of plan to manage Hepatitis C by attending all appointments and following the plan of care established by the provider . Self administers medications as prescribed . Attends all scheduled provider appointments . Calls provider office for new concerns or questions . Unable to independently manage Hepatitis C as evidence of multiple missed appointments  . Does not attend all scheduled provider appointments  Please see past  updates related to this goal by clicking on the "Past Updates" button in the selected goal          Plan:   The care management team will reach out to the patient again over the next 30 to 60 days.    Alto Denver RN, MSN, CCM Community Care Coordinator [place]  [facility] Mobile: [phone]

## 2020-06-14 ENCOUNTER — Other Ambulatory Visit: Payer: Self-pay

## 2020-06-14 ENCOUNTER — Encounter: Payer: Self-pay | Admitting: Family Medicine

## 2020-06-14 ENCOUNTER — Ambulatory Visit (INDEPENDENT_AMBULATORY_CARE_PROVIDER_SITE_OTHER): Payer: Medicare Other | Admitting: Family Medicine

## 2020-06-14 VITALS — BP 120/70 | HR 69 | Temp 97.8°F | Wt 85.6 lb

## 2020-06-14 DIAGNOSIS — M4722 Other spondylosis with radiculopathy, cervical region: Secondary | ICD-10-CM | POA: Diagnosis not present

## 2020-06-14 DIAGNOSIS — B182 Chronic viral hepatitis C: Secondary | ICD-10-CM

## 2020-06-14 DIAGNOSIS — R202 Paresthesia of skin: Secondary | ICD-10-CM

## 2020-06-14 DIAGNOSIS — M79609 Pain in unspecified limb: Secondary | ICD-10-CM

## 2020-06-14 NOTE — Progress Notes (Signed)
BP 120/70 (BP Location: Left Arm, Patient Position: Sitting, Cuff Size: Small)   Pulse 69   Temp 97.8 F (36.6 C) (Oral)   Wt 85 lb 9.6 oz (38.8 kg)   SpO2 99%   BMI 18.52 kg/m    Subjective:    Patient ID: Vanessa Oneal, female    DOB: [birth year], 68 y.o.   MRN: [id number]  HPI: Vanessa Oneal is a 70 y.o. female  Chief Complaint  Patient presents with  . Numbness    left arm  . Fatigue  . Headache   NUMBNESS Duration: few months, worse over the past few weeks Onset: gradual Location: whole arm Bilateral: no Symmetric: no Decreased sensation: no  Weakness: no Pain: no Quality:  Tingling and numb Severity: moderate  Frequency: intermittent, laying on it a certain way, rolling on it Trauma: no Recent illness: no Diabetes: no Thyroid disease: no  HIV: no  Alcoholism: no  Spinal cord injury: no Alleviating factors:  Aggravating factors: Status: worse  HEPATITIS C- has finished treatment in June, but has not had any follow up with GI Duration since diagnosis: chronic Hepatology evaluation:yes Liver biopsy:no  Cirrhosis: no Antiviral therapy:yes Hepatocellular carcinoma screening: yes Esophageal varices screening/EGD: no Hepatitis A Vaccine: unknown Hepatitis B Vaccine: unknown Pneumovax Vaccine: Up to Date   Relevant past medical, surgical, family and social history reviewed and updated as indicated. Interim medical history since our last visit reviewed. Allergies and medications reviewed and updated.  Review of Systems  Constitutional: Negative.   Respiratory: Negative.   Cardiovascular: Negative.   Gastrointestinal: Negative.   Musculoskeletal: Positive for arthralgias, myalgias, neck pain and neck stiffness. Negative for back pain, gait problem and joint swelling.  Skin: Negative.   Neurological: Positive for numbness and headaches. Negative for dizziness, tremors, seizures, syncope, facial asymmetry, speech difficulty, weakness and  light-headedness.  Psychiatric/Behavioral: Negative.     Per HPI unless specifically indicated above     Objective:    BP 120/70 (BP Location: Left Arm, Patient Position: Sitting, Cuff Size: Small)   Pulse 69   Temp 97.8 F (36.6 C) (Oral)   Wt 85 lb 9.6 oz (38.8 kg)   SpO2 99%   BMI 18.52 kg/m   Wt Readings from Last 3 Encounters:  06/14/20 85 lb 9.6 oz (38.8 kg)  03/11/20 86 lb 12.8 oz (39.4 kg)  03/04/20 86 lb 3.2 oz (39.1 kg)    Physical Exam Vitals and nursing note reviewed.  Constitutional:      General: She is not in acute distress.    Appearance: Normal appearance. She is not ill-appearing, toxic-appearing or diaphoretic.  HENT:     Head: Normocephalic and atraumatic.     Right Ear: External ear normal.     Left Ear: External ear normal.     Nose: Nose normal.     Mouth/Throat:     Mouth: Mucous membranes are moist.     Pharynx: Oropharynx is clear.  Eyes:     General: No scleral icterus.       Right eye: No discharge.        Left eye: No discharge.     Extraocular Movements: Extraocular movements intact.     Conjunctiva/sclera: Conjunctivae normal.     Pupils: Pupils are equal, round, and reactive to light.  Cardiovascular:     Rate and Rhythm: Normal rate and regular rhythm.     Pulses: Normal pulses.     Heart sounds: Normal heart sounds. No murmur  heard.  No friction rub. No gallop.   Pulmonary:     Effort: Pulmonary effort is normal. No respiratory distress.     Breath sounds: Normal breath sounds. No stridor. No wheezing, rhonchi or rales.  Chest:     Chest wall: No tenderness.  Musculoskeletal:        General: Normal range of motion.     Cervical back: Normal range of motion and neck supple.  Skin:    General: Skin is warm and dry.     Capillary Refill: Capillary refill takes less than 2 seconds.     Coloration: Skin is not jaundiced or pale.     Findings: No bruising, erythema, lesion or rash.  Neurological:     General: No focal deficit  present.     Mental Status: She is alert and oriented to person, place, and time. Mental status is at baseline.  Psychiatric:        Mood and Affect: Mood normal.        Behavior: Behavior normal.        Thought Content: Thought content normal.        Judgment: Judgment normal.     Results for orders placed or performed in visit on 03/11/20  Ceruloplasmin  Result Value Ref Range   Ceruloplasmin 25.6 19.0 - 39.0 mg/dL  Mitochondrial antibodies  Result Value Ref Range   Mitochondrial Ab <20.0 0.0 - 20.0 Units  Hepatitis B surface antibody,qualitative  Result Value Ref Range   Hep B Surface Ab, Qual Reactive   Hepatitis B surface antigen  Result Value Ref Range   Hepatitis B Surface Ag Negative Negative  Anti-smooth muscle antibody, IgG  Result Value Ref Range   Smooth Muscle Ab 38 (H) 0 - 19 Units  Alpha-1-antitrypsin  Result Value Ref Range   A-1 Antitrypsin 135 101 - 187 mg/dL  Hepatitis A antibody, total  Result Value Ref Range   hep A Total Ab Negative Negative  ANA  Result Value Ref Range   Anti Nuclear Antibody (ANA) Negative Negative  HCV FibroSURE  Result Value Ref Range   Fibrosis Score 0.26 (H) 0.00 - 0.21   Fibrosis Stage F0-F1    Necroinflammat Activity Score 0.04 0.00 - 0.17   Necroinflammat Activity Grade A0-No activity    ALPHA 2-MACROGLOBULINS, QN 342 (H) 110 - 276 mg/dL   Haptoglobin 85 37 - 553 mg/dL   Apolipoprotein A-1 748 116 - 209 mg/dL   Bilirubin, Total 0.2 0.0 - 1.2 mg/dL   GGT 14 0 - 60 IU/L   ALT (SGPT) P5P 14 0 - 40 IU/L   Interpretations: Comment    Fibrosis Scoring: Comment    Necroinflamm Activity Scoring: Comment    Limitations: Comment    Comment Comment       Assessment & Plan:   Problem List Items Addressed This Visit      Digestive   Chronic hepatitis C without hepatic coma (HCC)    States that she finished her treatment with GI. Has not had a follow up with them. Checking labs today and will reach out to them to see if she  needs a follow up.      Relevant Orders   HCV RNA quant   Comprehensive metabolic panel   CBC with Differential/Platelet     Musculoskeletal and Integument   Osteoarthritis - Primary    On c-spine X-ray in 2018. Will repeat x-ray and treat as needed, will likely need MRI. Await results.  Relevant Orders   DG Cervical Spine Complete    Other Visit Diagnoses    Paresthesia and pain of left extremity       Concern for cervical radiculopathy. Will check x-ray and await results. Anaphylatic to gabapentin. So will hold on neuroleptic at this time.    Relevant Orders   DG Cervical Spine Complete       Follow up plan: Return in about 4 weeks (around 07/12/2020).

## 2020-06-14 NOTE — Assessment & Plan Note (Addendum)
On c-spine X-ray in 2018. Will repeat x-ray and treat as needed, will likely need MRI. Await results.

## 2020-06-14 NOTE — Assessment & Plan Note (Signed)
States that she finished her treatment with GI. Has not had a follow up with them. Checking labs today and will reach out to them to see if she needs a follow up.

## 2020-06-15 LAB — COMPREHENSIVE METABOLIC PANEL
ALT: 11 IU/L (ref 0–32)
AST: 21 IU/L (ref 0–40)
Albumin/Globulin Ratio: 1.5 (ref 1.2–2.2)
Albumin: 4.5 g/dL (ref 3.8–4.8)
Alkaline Phosphatase: 83 IU/L (ref 48–121)
BUN/Creatinine Ratio: 17 (ref 12–28)
BUN: 13 mg/dL (ref 8–27)
Bilirubin Total: 0.3 mg/dL (ref 0.0–1.2)
CO2: 26 mmol/L (ref 20–29)
Calcium: 9.5 mg/dL (ref 8.7–10.3)
Chloride: 100 mmol/L (ref 96–106)
Creatinine, Ser: 0.75 mg/dL (ref 0.57–1.00)
GFR calc Af Amer: 95 mL/min/{1.73_m2} (ref >59)
GFR calc non Af Amer: 82 mL/min/{1.73_m2} (ref >59)
Globulin, Total: 3 g/dL (ref 1.5–4.5)
Glucose: 98 mg/dL (ref 65–99)
Potassium: 4.2 mmol/L (ref 3.5–5.2)
Sodium: 137 mmol/L (ref 134–144)
Total Protein: 7.5 g/dL (ref 6.0–8.5)

## 2020-06-15 LAB — CBC WITH DIFFERENTIAL/PLATELET
Basophils Absolute: 0 10*3/uL (ref 0.0–0.2)
Basos: 1 %
EOS (ABSOLUTE): 0.2 10*3/uL (ref 0.0–0.4)
Eos: 3 %
Hematocrit: 38.4 % (ref 34.0–46.6)
Hemoglobin: 13.3 g/dL (ref 11.1–15.9)
Immature Grans (Abs): 0 10*3/uL (ref 0.0–0.1)
Immature Granulocytes: 0 %
Lymphocytes Absolute: 1.6 10*3/uL (ref 0.7–3.1)
Lymphs: 29 %
MCH: 33.3 pg — ABNORMAL HIGH (ref 26.6–33.0)
MCHC: 34.6 g/dL (ref 31.5–35.7)
MCV: 96 fL (ref 79–97)
Monocytes Absolute: 0.4 10*3/uL (ref 0.1–0.9)
Monocytes: 8 %
Neutrophils Absolute: 3.3 10*3/uL (ref 1.4–7.0)
Neutrophils: 59 %
Platelets: 229 10*3/uL (ref 150–450)
RBC: 3.99 x10E6/uL (ref 3.77–5.28)
RDW: 11.9 % (ref 11.7–15.4)
WBC: 5.5 10*3/uL (ref 3.4–10.8)

## 2020-06-15 LAB — HCV RNA QUANT: Hepatitis C Quantitation: NOT DETECTED IU/mL

## 2020-06-17 ENCOUNTER — Encounter: Payer: Self-pay | Admitting: Family Medicine

## 2020-06-21 ENCOUNTER — Telehealth: Payer: Self-pay

## 2020-06-21 DIAGNOSIS — Z122 Encounter for screening for malignant neoplasm of respiratory organs: Secondary | ICD-10-CM

## 2020-06-21 DIAGNOSIS — Z87891 Personal history of nicotine dependence: Secondary | ICD-10-CM

## 2020-06-21 NOTE — Telephone Encounter (Signed)
I contacted patient's sister, Vanessa Oneal to schedule her annual lung screening CT scan.  Vanessa Oneal asked me if I could also register her sister Vanessa Oneal).  I reviewed patient chart and her last lung screening CT was 02/21/2018 (scans are scheduled yearly).  I scheduled patient for August 12 at 2:45.  Patient's current smoking status is less than 1/2 pack.  Patient has received her first COVID vaccine and has the second vaccine shot schedule but her sister did not know the date.  I have sent communication to Glenna Fellows, lung navigator to review patient record to agree that her screening scan is still appropriate based on missing an annual scan.

## 2020-06-22 NOTE — Addendum Note (Signed)
Addended by: Jonne Ply on: 06/22/2020 01:16 PM   Modules accepted: Orders

## 2020-06-22 NOTE — Telephone Encounter (Signed)
Smoking history: current, 84.75 pack year

## 2020-06-23 DIAGNOSIS — Z720 Tobacco use: Secondary | ICD-10-CM | POA: Diagnosis not present

## 2020-06-25 ENCOUNTER — Ambulatory Visit: Payer: Self-pay | Admitting: Licensed Clinical Social Worker

## 2020-06-25 DIAGNOSIS — I2510 Atherosclerotic heart disease of native coronary artery without angina pectoris: Secondary | ICD-10-CM

## 2020-06-25 DIAGNOSIS — M4722 Other spondylosis with radiculopathy, cervical region: Secondary | ICD-10-CM | POA: Diagnosis not present

## 2020-06-25 DIAGNOSIS — J439 Emphysema, unspecified: Secondary | ICD-10-CM | POA: Diagnosis not present

## 2020-06-25 DIAGNOSIS — B182 Chronic viral hepatitis C: Secondary | ICD-10-CM

## 2020-06-25 DIAGNOSIS — F419 Anxiety disorder, unspecified: Secondary | ICD-10-CM

## 2020-06-28 NOTE — Chronic Care Management (AMB) (Signed)
Care Management   Follow Up Note   06/28/2020 Name: Vanessa Oneal MRN: [id number] DOB: 05/15/1951  Referred by: Dorcas Carrow, DO Reason for referral : Chronic Care Management   Vanessa Oneal is a 69 y.o. year old female who is a primary care patient of Dorcas Carrow, DO. The care management team was consulted for assistance with care management and care coordination needs.    Review of patient status, including review of consultants reports, relevant laboratory and other test results, and collaboration with appropriate care team members and the patient's provider was performed as part of comprehensive patient evaluation and provision of chronic care management services.    SDOH (Social Determinants of Health) assessments performed: Yes See Care Plan activities for detailed interventions related to [facility])     Advanced Directives: See Care Plan and Vynca application for related entries.   Goals Addressed    .  SW: Pt: "I want to work on my mental health." (pt-stated)        Current Barriers:  . Chronic Mental Health needs related to Grief, Generalized Anxiety Disorder and added stressors . Limited social support . Transportation . Mental Health Concerns  . Social Isolation . Suicidal Ideation/Homicidal Ideation: No  Clinical Social Work Goal(s):  [name] Over the next 120 days, patient will work with SW to address concerns related to gaining additional support/resource connection and community resource education in order to maintain health and mental health appropriately  . Over the next 120 days, patient will work with SW bi-monthly by telephone or in person to reduce or manage symptoms related to anxiety and grief . Over the next 120 days, patient will demonstrate improved health management independence as evidenced by implementing healthy self-care and positive support/resources into her daily routine to cope with stressors and improve overall health and well-being    Interventions: . Patient interviewed and appropriate assessments performed: brief mental health assessment . Provided mental health counseling with regard to grief and anxiety management. Patient lost sister this month. Coping skill education provided. . Discussed plans with patient for ongoing care management follow up and provided patient with direct contact information for care management team . Advised patient to implement self-care instructions that we dicussed during session today . Assisted patient/caregiver with obtaining information about health plan benefits . Provided education and assistance to client regarding Advanced Directives. . Provided education to patient/caregiver regarding level of care options. . Provided education to patient/caregiver about Hospice and/or Palliative Care services . Patient reports that her family have been very supportive of her since the loss of her sister. Positive reinforcement provided for asking for help and socializing with people she feels safe with when needed.  . Referred patient to [facility] (mental health provider) for individual grief counseling but patient declined wanting any mental health support at this time.   Patient Self Care Activities:  . Attends all scheduled provider appointments . Calls provider office for new concerns or questions . Ability for insight . Strong family or social support  Patient Coping Strengths:  . Supportive Relationships . Family . Spirituality . Hopefulness . Self Advocate . Able to Communicate Effectively  Patient Self Care Deficits:  . Lacks social connections . Recent passing of sister   Initial goal documentation      The care management team will reach out to the patient again over the next 90 days.   Dickie La, BSW, MSW, LCSW [facility] Care Management [place]  [facility] Production assistant, [email or web address] Phone: [phone]

## 2020-07-15 ENCOUNTER — Ambulatory Visit
Admission: RE | Admit: 2020-07-15 | Discharge: 2020-07-15 | Disposition: A | Discharge status: Home / Self Care | Payer: Medicare Other | Source: Ambulatory Visit | Attending: Oncology | Admitting: Oncology

## 2020-07-15 ENCOUNTER — Other Ambulatory Visit: Payer: Self-pay

## 2020-07-15 DIAGNOSIS — Z122 Encounter for screening for malignant neoplasm of respiratory organs: Secondary | ICD-10-CM | POA: Insufficient documentation

## 2020-07-15 DIAGNOSIS — Z87891 Personal history of nicotine dependence: Secondary | ICD-10-CM | POA: Diagnosis not present

## 2020-07-16 ENCOUNTER — Ambulatory Visit: Payer: Medicare Other | Admitting: Family Medicine

## 2020-07-20 ENCOUNTER — Encounter: Payer: Self-pay | Admitting: *Deleted

## 2020-07-23 DIAGNOSIS — Z720 Tobacco use: Secondary | ICD-10-CM | POA: Diagnosis not present

## 2020-07-28 ENCOUNTER — Ambulatory Visit (INDEPENDENT_AMBULATORY_CARE_PROVIDER_SITE_OTHER): Payer: Medicare Other | Admitting: General Practice

## 2020-07-28 ENCOUNTER — Telehealth: Payer: Self-pay | Admitting: General Practice

## 2020-07-28 DIAGNOSIS — I2510 Atherosclerotic heart disease of native coronary artery without angina pectoris: Secondary | ICD-10-CM

## 2020-07-28 DIAGNOSIS — F419 Anxiety disorder, unspecified: Secondary | ICD-10-CM

## 2020-07-28 DIAGNOSIS — B182 Chronic viral hepatitis C: Secondary | ICD-10-CM

## 2020-07-28 NOTE — Patient Instructions (Signed)
Visit Information  Goals Addressed              This Visit's Progress     RNCM: Pt-"I want to take better care of myself" (pt-stated)        CARE PLAN ENTRY (see longtitudinal plan of care for additional care plan information)  Current Barriers:   Chronic Disease Management support, education, and care coordination needs related to CAD, Anxiety, and Pulmonary Disease  Clinical Goal(s) related to CAD, Anxiety, and Pulmonary Disease:  Over the next 120 days, patient will:   Work with the care management team to address educational, disease management, and care coordination needs   Begin or continue self health monitoring activities as directed today  adhere to a heart healthy diet and utilize effective coping mechanisms for anxiety and stress  Call provider office for new or worsened signs and symptoms Shortness of breath and New or worsened symptom related to CAD and Anxiety  Call care management team with questions or concerns  Verbalize basic understanding of patient centered plan of care established today  Interventions related to CAD, Anxiety, and Pulmonary Disease:   Evaluation of current treatment plans and patient's adherence to plan as established by provider.  The patient states that she wants to take care of herself and she wants to follow the instructions of her doctors. She is very forgetful and needs redirection at times. Education and support.   Assessed patient understanding of disease states.  The patient has had issues with depression and anxiety recently as her only sibling passed away unexpectedly on May 27, 2023.  The patient states she has been having a hard time but is thankful for her niece being there with her. 07-28-2020: The patient states she is doing well at this time. Still has periods of being stressed out and depressed but has a great family support system.   Assessed patient's education and care coordination needs.  Continued education and support on  chronic disease processes and needs of the patient. The patient is thankful for the CCM team working with her.   Provided disease specific education to patient.  Education on follow up with GI MD for recommendations on managing her Hepatitis C and follow up with pcp for chronic disease management.  07-28-2020: The patient states that she has not followed up with the GI doctor and didn't know if she was supposed to do so. The patient gets forgetful and needs reinforcement. The patient does not have a follow up with the pcp but agrees for the office to call and get her an appointment to be seen. Will send an in basket message to the administrative staff asking to help the patient get an appointment.   Collaborated with appropriate clinical care team members regarding patient needs.  Will let the LCSW know that the patients sister passed and the hard time she has been having. 07-28-2020: The patient is working with the LCSW for expressed needs and ongoing support and education.   Evaluation of upcoming appointments. The patient was last seen on 06-14-2020. Per Dr. Henriette Combs notes the patient should have come back around 07-12-2020. Will get front office staff to call and assist with getting an appointment.   Patient Self Care Activities related to CAD, Anxiety, and Pulmonary Disease:   Patient is unable to independently self-manage chronic health conditions  Please see past updates related to this goal by clicking on the "Past Updates" button in the selected goal        RNCM:  Pt-"They called yesterday and changed my appointment" (pt-stated)        CARE PLAN ENTRY (see longtitudinal plan of care for additional care plan information)  Current Barriers:   Knowledge Deficits related to Hepatitis C and the importance of continued care and treatment for Hepatitis C  Financial Constraints.   Transportation barriers  Non-adherence to scheduled provider appointments  Nurse Case Manager Clinical Goal(s):     Over the next 120 days, patient will verbalize understanding of plan for Hepatitis C and management of Hepatitis C  Over the next 120 days, patient will work with RNCM, pcp, and CCM team to address needs related to Hepatitis C  Over the next 120 days, patient will demonstrate a decrease in Hepatitis C exacerbations as evidenced by attending GI appointments and following plan of care for Hepatitis C  Over the next 120 days, patient will attend all scheduled medical appointments: the patients appointment with Dr. Midge Minium was rescheduled from 03/10/2020 to 03/11/2020 at 130pm.   Over the next 120 days, patient will verbalize basic understanding of Hepatitis C disease process and self health management plan as evidenced by compliance with prescribed treatment plan  Over the next 120 days, patient will work with CM clinical social worker to help with anxiety and depression  Over the next 120 days, patient will work with care guides  to help with food resources such as [employer]   Over the next 120 days, the patient will demonstrate ongoing self health care management ability as evidenced by compliance with medication regimen and plan of care for management of Hepatitis C  Interventions:   Evaluation of current treatment plan related to Hepatitis C and patient's adherence to plan as established by provider.  Advised patient to call for changes in appointments or transportation needs   Provided education to patient re: Hepatitis C, taking medications with her to her appointment and writing down questions to ask the provider  Collaborated with Dr. Servando Snare office  regarding patients appointment on 03/11/2020. Directions given to come to [address], [place], [place] [address].  The patient verbalized her friend is taking her to the appointment tomorrow. Education on the importance of going to the appointment as scheduled. The patient confirmed she is going to her appointment.   Her niece had a talk with her and told her how important it was for her to go also. 06-09-2020: the patient went to her appointment. She says they want to treat her for her liver. She said they were supposed to send her paperwork.  She has not heard from them. Recommended she call their office and talk to the office about follow up. 07-28-2020: The patient states she has not heard from the GI provider. Will see about contacting  Dr. Annabell Sabal office to get the patient a follow up appointment. Call made to Dr. Annabell Sabal office, the [facility] was unable to talk to staff but a detailed message left asking for the office to call the patient to get a follow up appointment in place to come back and see Dr. Servando Snare.  RNCM information also left on the voice mail for additional assistance in helping the patient get needed appointment.   Discussed plans with patient for ongoing care management follow up and provided patient with direct contact information for care management team  Reviewed scheduled/upcoming provider appointments including: Appointment with Dr. Midge Minium, GI specialist on 03/11/2020 at 1:30 pm.  This is completed. 07-28-2020: Detailed message left on Voice  mail for office staff at Dr. Annabell Sabal to please call the patient and schedule a follow up appointment.   Care Guide referral for food resources in the community- completed.   Social Work referral for patient need for coping mechanisms. Patient tearful during the call but thankful for the support of the CCM team.   Patient Self Care Activities:   Patient verbalizes understanding of plan to manage Hepatitis C by attending all appointments and following the plan of care established by the provider  Self administers medications as prescribed  Attends all scheduled provider appointments  Calls provider office for new concerns or questions  Unable to independently manage Hepatitis C as evidence of multiple missed appointments   Does not attend all scheduled  provider appointments  Please see past updates related to this goal by clicking on the "Past Updates" button in the selected goal         Patient verbalizes understanding of instructions provided today.   Telephone follow up appointment with care management team member scheduled for: 09-03-2020 at 3:15pm  Alto Denver RN, MSN, CCM Community Care Coordinator [place]   [facility] Mobile: [phone]

## 2020-07-28 NOTE — Progress Notes (Signed)
Scheduled for 08/03/20

## 2020-07-28 NOTE — Chronic Care Management (AMB) (Signed)
Chronic Care Management   Follow Up Note   07/28/2020 Name: EVORA FLEITES MRN: [id number] DOB: [birth year]  Referred by: Dorcas Carrow, DO Reason for referral : Chronic Care Management (RNCM follow up call for Chronic Disease Management and Care Coordination Needs )   LEANDA BRAASCH is a 69 y.o. year old female who is a primary care patient of Dorcas Carrow, DO. The CCM team was consulted for assistance with chronic disease management and care coordination needs.    Review of patient status, including review of consultants reports, relevant laboratory and other test results, and collaboration with appropriate care team members and the patient's provider was performed as part of comprehensive patient evaluation and provision of chronic care management services.    SDOH (Social Determinants of Health) assessments performed: Yes See Care Plan activities for detailed interventions related to [facility])     Outpatient Encounter Medications as of 07/28/2020  Medication Sig  . busPIRone (BUSPAR) 15 MG tablet Take 15 mg by mouth at bedtime.  . Glecaprevir-Pibrentasvir (MAVYRET) 100-40 MG TABS Take 3 tablets by mouth daily. (Patient not taking: Reported on 06/14/2020)  . SUBOXONE 8-2 MG FILM Take 1 Film by mouth 2 (two) times daily.   . Vitamin D, Ergocalciferol, (DRISDOL) 1.25 MG (50000 UNIT) CAPS capsule Take 1 capsule (50,000 Units total) by mouth every 7 (seven) days. (Patient not taking: Reported on 06/14/2020)   No facility-administered encounter medications on file as of 07/28/2020.     Objective:  BP Readings from Last 3 Encounters:  06/14/20 120/70  03/11/20 108/67  03/04/20 (!) 143/82    Goals Addressed              This Visit's Progress   .  RNCM: Pt-"I want to take better care of myself" (pt-stated)        CARE PLAN ENTRY (see longtitudinal plan of care for additional care plan information)  Current Barriers:  . Chronic Disease Management support, education, and  care coordination needs related to CAD, Anxiety, and Pulmonary Disease  Clinical Goal(s) related to CAD, Anxiety, and Pulmonary Disease:  Over the next 120 days, patient will:  . Work with the care management team to address educational, disease management, and care coordination needs  . Begin or continue self health monitoring activities as directed today  adhere to a heart healthy diet and utilize effective coping mechanisms for anxiety and stress . Call provider office for new or worsened signs and symptoms Shortness of breath and New or worsened symptom related to CAD and Anxiety . Call care management team with questions or concerns . Verbalize basic understanding of patient centered plan of care established today  Interventions related to CAD, Anxiety, and Pulmonary Disease:  . Evaluation of current treatment plans and patient's adherence to plan as established by provider.  The patient states that she wants to take care of herself and she wants to follow the instructions of her doctors. She is very forgetful and needs redirection at times. Education and support.  . Assessed patient understanding of disease states.  The patient has had issues with depression and anxiety recently as her only sibling passed away unexpectedly on 05/18/2023.  The patient states she has been having a hard time but is thankful for her niece being there with her. 07-28-2020: The patient states she is doing well at this time. Still has periods of being stressed out and depressed but has a great family support system.  . Assessed patient's education  and care coordination needs.  Continued education and support on chronic disease processes and needs of the patient. The patient is thankful for the CCM team working with her.  . Provided disease specific education to patient.  Education on follow up with GI MD for recommendations on managing her Hepatitis C and follow up with pcp for chronic disease management.  07-28-2020: The  patient states that she has not followed up with the GI doctor and didn't know if she was supposed to do so. The patient gets forgetful and needs reinforcement. The patient does not have a follow up with the pcp but agrees for the office to call and get her an appointment to be seen. Will send an in basket message to the administrative staff asking to help the patient get an appointment.  Steele Sizer with appropriate clinical care team members regarding patient needs.  Will let the LCSW know that the patients sister passed and the hard time she has been having. 07-28-2020: The patient is working with the LCSW for expressed needs and ongoing support and education.  . Evaluation of upcoming appointments. The patient was last seen on 06-14-2020. Per Dr. Henriette Combs notes the patient should have come back around 07-12-2020. Will get front office staff to call and assist with getting an appointment.   Patient Self Care Activities related to CAD, Anxiety, and Pulmonary Disease:  . Patient is unable to independently self-manage chronic health conditions  Please see past updates related to this goal by clicking on the "Past Updates" button in the selected goal      .  RNCM: Pt-"They called yesterday and changed my appointment" (pt-stated)        CARE PLAN ENTRY (see longtitudinal plan of care for additional care plan information)  Current Barriers:  [name] Knowledge Deficits related to Hepatitis C and the importance of continued care and treatment for Hepatitis C . Corporate treasurer.  . Transportation barriers . Non-adherence to scheduled provider appointments  Nurse Case Manager Clinical Goal(s):  [name] Over the next 120 days, patient will verbalize understanding of plan for Hepatitis C and management of Hepatitis C . Over the next 120 days, patient will work with RNCM, pcp, and CCM team to address needs related to Hepatitis C . Over the next 120 days, patient will demonstrate a decrease in Hepatitis C  exacerbations as evidenced by attending GI appointments and following plan of care for Hepatitis C . Over the next 120 days, patient will attend all scheduled medical appointments: the patients appointment with Dr. Midge Minium was rescheduled from 03/10/2020 to 03/11/2020 at 130pm.  . Over the next 120 days, patient will verbalize basic understanding of Hepatitis C disease process and self health management plan as evidenced by compliance with prescribed treatment plan . Over the next 120 days, patient will work with CM clinical social worker to help with anxiety and depression . Over the next 120 days, patient will work with care guides  to help with food resources such as [employer]  . Over the next 120 days, the patient will demonstrate ongoing self health care management ability as evidenced by compliance with medication regimen and plan of care for management of Hepatitis C  Interventions:  . Evaluation of current treatment plan related to Hepatitis C and patient's adherence to plan as established by provider. . Advised patient to call for changes in appointments or transportation needs  . Provided education to patient re: Hepatitis C, taking medications with her to her  appointment and writing down questions to ask the provider . Collaborated with Dr. Servando Snare office  regarding patients appointment on 03/11/2020. Directions given to come to [address], [address], [place], [place] [address].  The patient verbalized her friend is taking her to the appointment tomorrow. Education on the importance of going to the appointment as scheduled. The patient confirmed she is going to her appointment.  Her niece had a talk with her and told her how important it was for her to go also. 06-09-2020: the patient went to her appointment. She says they want to treat her for her liver. She said they were supposed to send her paperwork.  She has not heard from them. Recommended she call their office and talk to the  office about follow up. 07-28-2020: The patient states she has not heard from the GI provider. Will see about contacting  Dr. Annabell Sabal office to get the patient a follow up appointment. Call made to Dr. Annabell Sabal office, the [facility] was unable to talk to staff but a detailed message left asking for the office to call the patient to get a follow up appointment in place to come back and see Dr. Servando Snare.  RNCM information also left on the voice mail for additional assistance in helping the patient get needed appointment.  . Discussed plans with patient for ongoing care management follow up and provided patient with direct contact information for care management team . Reviewed scheduled/upcoming provider appointments including: Appointment with Dr. Midge Minium, GI specialist on 03/11/2020 at 1:30 pm.  This is completed. 07-28-2020: Detailed message left on Voice mail for office staff at Dr. Annabell Sabal to please call the patient and schedule a follow up appointment.  . Care Guide referral for food resources in the community- completed.  . Social Work referral for patient need for coping mechanisms. Patient tearful during the call but thankful for the support of the CCM team.   Patient Self Care Activities:  . Patient verbalizes understanding of plan to manage Hepatitis C by attending all appointments and following the plan of care established by the provider . Self administers medications as prescribed . Attends all scheduled provider appointments . Calls provider office for new concerns or questions . Unable to independently manage Hepatitis C as evidence of multiple missed appointments  . Does not attend all scheduled provider appointments  Please see past updates related to this goal by clicking on the "Past Updates" button in the selected goal          Plan:   Telephone follow up appointment with care management team member scheduled for: 09-03-2020 at 3:15 pm   Alto Denver RN, MSN, CCM Community Care  Coordinator [place]  [facility] Mobile: [phone]

## 2020-08-03 ENCOUNTER — Ambulatory Visit: Payer: Medicare Other | Admitting: Family Medicine

## 2020-08-10 ENCOUNTER — Other Ambulatory Visit: Payer: Self-pay

## 2020-08-10 ENCOUNTER — Ambulatory Visit: Payer: Medicare Other | Admitting: Family Medicine

## 2020-08-12 ENCOUNTER — Ambulatory Visit: Payer: Medicare Other | Admitting: Gastroenterology

## 2020-08-12 ENCOUNTER — Encounter: Payer: Self-pay | Admitting: *Deleted

## 2020-08-13 ENCOUNTER — Encounter: Payer: Self-pay | Admitting: Family Medicine

## 2020-08-13 ENCOUNTER — Ambulatory Visit (INDEPENDENT_AMBULATORY_CARE_PROVIDER_SITE_OTHER): Payer: Medicare Other | Admitting: Family Medicine

## 2020-08-13 DIAGNOSIS — Z20822 Contact with and (suspected) exposure to covid-19: Secondary | ICD-10-CM

## 2020-08-13 MED ORDER — PREDNISONE 50 MG PO TABS: 50.0000 mg | ORAL_TABLET | Freq: Every day | ORAL | 0 refills | Status: DC

## 2020-08-13 NOTE — Patient Instructions (Signed)
To schedule a COVID test, please  text "COVID" to [phone], OR you can log on to [email or web address] to easily make an on-line appointment. If you do not have access to a smart phone or PC, you can call [phone] to get assistance.  

## 2020-08-13 NOTE — Progress Notes (Signed)
There were no vitals taken for this visit.   Subjective:    Patient ID: Vanessa Oneal, female    DOB: 28-Feb-1951, 69 y.o.   MRN: [id number]  HPI: Vanessa Oneal is a 69 y.o. female  Chief Complaint  Patient presents with  . URI    pt states that a few days ago she had some coughing a SOB, states she does not have symptoms now   UPPER RESPIRATORY TRACT INFECTION Duration: 08/10/20 Worst symptom: SOB, fever Fever: yes Cough: yes Shortness of breath: yes Wheezing: no Chest pain: yes Chest tightness: yes Chest congestion: no Nasal congestion: yes Runny nose: yes Post nasal drip: yes Sneezing: no Sore throat: no Swollen glands: no Sinus pressure: no Headache: no Face pain: no Toothache: no Ear pain: no  Ear pressure: no  Eyes red/itching:no Eye drainage/crusting: no  Vomiting: no Rash: no Fatigue: yes Sick contacts: yes Strep contacts: no  Context: better Recurrent sinusitis: no Relief with OTC cold/cough medications: no  Treatments attempted: none  Relevant past medical, surgical, family and social history reviewed and updated as indicated. Interim medical history since our last visit reviewed. Allergies and medications reviewed and updated.  Review of Systems  Constitutional: Positive for fatigue and fever. Negative for activity change, appetite change, chills, diaphoresis and unexpected weight change.  HENT: Positive for congestion, postnasal drip, rhinorrhea, sinus pressure and sinus pain. Negative for dental problem, drooling, ear discharge, ear pain, facial swelling, hearing loss, mouth sores, nosebleeds, sneezing, sore throat, tinnitus, trouble swallowing and voice change.   Eyes: Negative.   Respiratory: Positive for cough, chest tightness, shortness of breath and wheezing. Negative for apnea, choking and stridor.   Cardiovascular: Negative.  Negative for chest pain, palpitations and leg swelling.  Gastrointestinal: Negative.   Neurological:  Negative.   Psychiatric/Behavioral: Negative.     Per HPI unless specifically indicated above     Objective:    There were no vitals taken for this visit.  Wt Readings from Last 3 Encounters:  07/15/20 80 lb (36.3 kg)  06/14/20 85 lb 9.6 oz (38.8 kg)  03/11/20 86 lb 12.8 oz (39.4 kg)    Physical Exam Vitals and nursing note reviewed.  Pulmonary:     Effort: Pulmonary effort is normal. No respiratory distress.     Comments: Speaking in 1-2 word bursts Neurological:     Mental Status: She is alert.  Psychiatric:        Mood and Affect: Mood normal.        Behavior: Behavior normal.        Thought Content: Thought content normal.        Judgment: Judgment normal.     Results for orders placed or performed in visit on 06/14/20  HCV RNA quant  Result Value Ref Range   Hepatitis C Quantitation HCV Not Detected IU/mL   Test Information Comment   Comprehensive metabolic panel  Result Value Ref Range   Glucose 98 65 - 99 mg/dL   BUN 13 8 - 27 mg/dL   Creatinine, Ser 4.09 0.57 - 1.00 mg/dL   GFR calc non Af Amer 82 >59 mL/min/1.73   GFR calc Af Amer 95 >59 mL/min/1.73   BUN/Creatinine Ratio 17 12 - 28   Sodium 137 134 - 144 mmol/L   Potassium 4.2 3.5 - 5.2 mmol/L   Chloride 100 96 - 106 mmol/L   CO2 26 20 - 29 mmol/L   Calcium 9.5 8.7 - 10.3 mg/dL   Total  Protein 7.5 6.0 - 8.5 g/dL   Albumin 4.5 3.8 - 4.8 g/dL   Globulin, Total 3.0 1.5 - 4.5 g/dL   Albumin/Globulin Ratio 1.5 1.2 - 2.2   Bilirubin Total 0.3 0.0 - 1.2 mg/dL   Alkaline Phosphatase 83 48 - 121 IU/L   AST 21 0 - 40 IU/L   ALT 11 0 - 32 IU/L  CBC with Differential/Platelet  Result Value Ref Range   WBC 5.5 3.4 - 10.8 x10E3/uL   RBC 3.99 3.77 - 5.28 x10E6/uL   Hemoglobin 13.3 11.1 - 15.9 g/dL   Hematocrit 59.7 41.6 - 46.6 %   MCV 96 79 - 97 fL   MCH 33.3 (H) 26.6 - 33.0 pg   MCHC 34.6 31 - 35 g/dL   RDW 38.4 53.6 - 46.8 %   Platelets 229 150 - 450 x10E3/uL   Neutrophils 59 Not Estab. %   Lymphs 29  Not Estab. %   Monocytes 8 Not Estab. %   Eos 3 Not Estab. %   Basos 1 Not Estab. %   Neutrophils Absolute 3.3 1 - 7 x10E3/uL   Lymphocytes Absolute 1.6 0 - 3 x10E3/uL   Monocytes Absolute 0.4 0 - 0 x10E3/uL   EOS (ABSOLUTE) 0.2 0.0 - 0.4 x10E3/uL   Basophils Absolute 0.0 0 - 0 x10E3/uL   Immature Granulocytes 0 Not Estab. %   Immature Grans (Abs) 0.0 0.0 - 0.1 x10E3/uL      Assessment & Plan:   Problem List Items Addressed This Visit    None    Visit Diagnoses    Suspected COVID-19 virus infection    -  Primary   Very short of breath on the phone. Will get COVID-tested. Self-quarantine until results are back. Prednisone to help with breathing. Call with any concerns.    Relevant Orders   Novel Coronavirus, NAA ([facility])       Follow up plan: Return in about 1 week (around 08/20/2020) for virtual recheck.   . This visit was completed via telephone due to the restrictions of the COVID-19 pandemic. All issues as above were discussed and addressed but no physical exam was performed. If it was felt that the patient should be evaluated in the office, they were directed there. The patient verbally consented to this visit. Patient was unable to complete an audio/visual visit due to Lack of equipment. Due to the catastrophic nature of the COVID-19 pandemic, this visit was done through audio contact only. . Location of the patient: home . Location of the provider: home . Those involved with this call:  . Provider: Olevia Perches, DO . CMA: Wilhemena Durie, CMA . Front Desk/Registration: Adela Ports  . Time spent on call: 21 minutes with patient face to face via video conference. More than 50% of this time was spent in counseling and coordination of care. 25 minutes total spent in review of patient's record and preparation of their chart.

## 2020-08-16 ENCOUNTER — Telehealth: Payer: Self-pay | Admitting: Family Medicine

## 2020-08-16 ENCOUNTER — Other Ambulatory Visit: Payer: Medicare Other

## 2020-08-16 NOTE — Telephone Encounter (Signed)
-----   Message from Dorcas Carrow, DO sent at 08/13/2020  3:25 PM EDT ----- 1 week virtual recheck

## 2020-08-16 NOTE — Telephone Encounter (Signed)
Unable to lvm to make this apt/  

## 2020-08-17 NOTE — Telephone Encounter (Signed)
Unable to lvm to make this apt/  

## 2020-08-18 ENCOUNTER — Other Ambulatory Visit: Payer: Medicare Other

## 2020-08-18 ENCOUNTER — Other Ambulatory Visit: Payer: Self-pay | Admitting: Radiology

## 2020-08-18 DIAGNOSIS — Z20822 Contact with and (suspected) exposure to covid-19: Secondary | ICD-10-CM

## 2020-08-19 LAB — SARS-COV-2, NAA 2 DAY TAT

## 2020-08-19 LAB — NOVEL CORONAVIRUS, NAA: SARS-CoV-2, NAA: NOT DETECTED

## 2020-08-19 NOTE — Telephone Encounter (Signed)
Pt scheduled. Pt verbalized understanding.

## 2020-08-25 ENCOUNTER — Telehealth: Payer: Medicare Other | Admitting: Family Medicine

## 2020-08-25 ENCOUNTER — Telehealth (INDEPENDENT_AMBULATORY_CARE_PROVIDER_SITE_OTHER): Payer: Medicare Other | Admitting: Family Medicine

## 2020-08-25 ENCOUNTER — Encounter: Payer: Self-pay | Admitting: Family Medicine

## 2020-08-25 ENCOUNTER — Telehealth: Payer: Self-pay

## 2020-08-25 ENCOUNTER — Telehealth: Payer: Self-pay | Admitting: Licensed Clinical Social Worker

## 2020-08-25 VITALS — BP 116/71 | HR 80 | Temp 97.9°F | Wt 84.0 lb

## 2020-08-25 DIAGNOSIS — L03211 Cellulitis of face: Secondary | ICD-10-CM | POA: Diagnosis not present

## 2020-08-25 MED ORDER — TRIAMCINOLONE ACETONIDE 40 MG/ML IJ SUSP
40.0000 mg | Freq: Once | INTRAMUSCULAR | Status: AC
  Administered 2020-08-25: 40 mg via INTRAMUSCULAR

## 2020-08-25 MED ORDER — SULFAMETHOXAZOLE-TRIMETHOPRIM 800-160 MG PO TABS
1.0000 | ORAL_TABLET | Freq: Two times a day (BID) | ORAL | 0 refills | Status: DC
Start: 2020-08-25 — End: 2020-12-23

## 2020-08-25 NOTE — Addendum Note (Signed)
Addended by: Dorcas Carrow on: 08/25/2020 05:05 PM   Modules accepted: Orders

## 2020-08-25 NOTE — Addendum Note (Signed)
Addended by: Dorcas Carrow on: 08/25/2020 04:53 PM   Modules accepted: Orders

## 2020-08-25 NOTE — Telephone Encounter (Signed)
  Chronic Care Management    Clinical Social Work General Follow Up Note  08/25/2020 Name: Vanessa Oneal MRN: [id number] DOB: 1951/01/25  BREANNAH Oneal is a 69 y.o. year old female who is a primary care patient of Dorcas Carrow, DO. The CCM team was consulted for assistance with Grief Counseling and Mental Health Support  Review of patient status, including review of consultants reports, relevant laboratory and other test results, and collaboration with appropriate care team members and the patient's provider was performed as part of comprehensive patient evaluation and provision of chronic care management services.    LCSW completed CCM outreach attempt today but was unable to reach patient successfully. A HIPPA compliant voice message was left encouraging patient to return call once available. LCSW will ask Scheduling Care Guide to reschedule CCM SW appointment with patient as well.  Outpatient Encounter Medications as of 08/25/2020  Medication Sig  . busPIRone (BUSPAR) 15 MG tablet Take 15 mg by mouth at bedtime. (Patient not taking: Reported on 08/13/2020)  . Glecaprevir-Pibrentasvir (MAVYRET) 100-40 MG TABS Take 3 tablets by mouth daily. (Patient not taking: Reported on 06/14/2020)  . predniSONE (DELTASONE) 50 MG tablet Take 1 tablet (50 mg total) by mouth daily with breakfast.  . SUBOXONE 8-2 MG FILM Take 1 Film by mouth 2 (two) times daily.   . Vitamin D, Ergocalciferol, (DRISDOL) 1.25 MG (50000 UNIT) CAPS capsule Take 1 capsule (50,000 Units total) by mouth every 7 (seven) days. (Patient not taking: Reported on 06/14/2020)   No facility-administered encounter medications on file as of 08/25/2020.    Follow Up Plan: Scheduling Care Guide will reach out to patient to reschedule appointment.   Dickie La, BSW, MSW, LCSW [facility] [place]  [facility] [email or web address] Phone: [phone]

## 2020-08-25 NOTE — Addendum Note (Signed)
Addended by: Dorcas Carrow on: 08/25/2020 05:04 PM   Modules accepted: Orders

## 2020-08-25 NOTE — Progress Notes (Signed)
BP 116/71   Pulse 80   Temp 97.9 F (36.6 C) (Oral)   Wt 84 lb (38.1 kg)   SpO2 94%   BMI 16.41 kg/m    Subjective:    Patient ID: Vanessa Oneal, female    DOB: Apr 27, 1951, 69 y.o.   MRN: [id number]  HPI: Vanessa Oneal is a 69 y.o. female  Chief Complaint  Patient presents with  . URI    1 week f/u. pt states has had swollen face, nose/ throat after she had the covid test    UPPER RESPIRATORY TRACT INFECTION Duration: since covid test on 9/15 (negative) Worst symptom: facial pain and nose swelling Fever: no Cough: no Shortness of breath: no Wheezing: no Chest pain: no Chest tightness: no Chest congestion: no Nasal congestion: yes Runny nose: no Post nasal drip: no Sneezing: no Sore throat: no Swollen glands: yes Sinus pressure: yes Headache: no Face pain: yes Toothache: no Ear pain: no  Ear pressure: no  Eyes red/itching:no Eye drainage/crusting: no  Vomiting: no Rash: no Fatigue: yes Sick contacts: no Strep contacts: no  Context: worse Recurrent sinusitis: no Relief with OTC cold/cough medications: no  Treatments attempted: prednisone    Relevant past medical, surgical, family and social history reviewed and updated as indicated. Interim medical history since our last visit reviewed. Allergies and medications reviewed and updated.  Review of Systems  Constitutional: Negative.   HENT: Positive for facial swelling and sinus pain. Negative for congestion, dental problem, drooling, ear discharge, ear pain, hearing loss, mouth sores, nosebleeds, postnasal drip, rhinorrhea, sinus pressure, sneezing, sore throat, tinnitus, trouble swallowing and voice change.   Respiratory: Negative.   Cardiovascular: Negative.   Gastrointestinal: Negative.   Skin: Positive for color change. Negative for pallor, rash and wound.  Psychiatric/Behavioral: Negative.     Per HPI unless specifically indicated above     Objective:    BP 116/71   Pulse 80   Temp  97.9 F (36.6 C) (Oral)   Wt 84 lb (38.1 kg)   SpO2 94%   BMI 16.41 kg/m   Wt Readings from Last 3 Encounters:  08/25/20 84 lb (38.1 kg)  07/15/20 80 lb (36.3 kg)  06/14/20 85 lb 9.6 oz (38.8 kg)    Physical Exam Vitals and nursing note reviewed.  Constitutional:      General: She is not in acute distress.    Appearance: Normal appearance. She is not ill-appearing, toxic-appearing or diaphoretic.     Comments: Uncomfortable. In pain  HENT:     Head: Normocephalic and atraumatic.     Right Ear: Tympanic membrane, ear canal and external ear normal.     Left Ear: Tympanic membrane, ear canal and external ear normal.     Nose: Nose normal. No congestion or rhinorrhea.     Mouth/Throat:     Mouth: Mucous membranes are moist.     Pharynx: Oropharynx is clear. No oropharyngeal exudate or posterior oropharyngeal erythema.  Eyes:     General: No scleral icterus.       Right eye: No discharge.        Left eye: No discharge.     Extraocular Movements: Extraocular movements intact.     Conjunctiva/sclera: Conjunctivae normal.     Pupils: Pupils are equal, round, and reactive to light.  Cardiovascular:     Rate and Rhythm: Normal rate and regular rhythm.     Pulses: Normal pulses.     Heart sounds: Normal heart sounds. No  murmur heard.  No friction rub. No gallop.   Pulmonary:     Effort: Pulmonary effort is normal. No respiratory distress.     Breath sounds: Normal breath sounds. No stridor. No wheezing, rhonchi or rales.  Chest:     Chest wall: No tenderness.  Musculoskeletal:        General: Normal range of motion.     Cervical back: Normal range of motion and neck supple.  Skin:    General: Skin is warm and dry.     Capillary Refill: Capillary refill takes less than 2 seconds.     Coloration: Skin is not jaundiced or pale.     Findings: No bruising, erythema, lesion or rash.     Comments: Bright red, hot, swollen nose and L cheek with swollen submandibular lymph nodes.    Neurological:     General: No focal deficit present.     Mental Status: She is alert and oriented to person, place, and time. Mental status is at baseline.  Psychiatric:        Mood and Affect: Mood normal.        Behavior: Behavior normal.        Thought Content: Thought content normal.        Judgment: Judgment normal.     Results for orders placed or performed in visit on 08/18/20  Novel Coronavirus, NAA ([facility])   Specimen: Nasopharyngeal(NP) swabs in vial transport medium   Nasopharynge  Screenin  Result Value Ref Range   SARS-CoV-2, NAA Not Detected Not Detected  SARS-COV-2, NAA 2 DAY TAT   Nasopharynge  Screenin  Result Value Ref Range   SARS-CoV-2, NAA 2 DAY TAT Performed       Assessment & Plan:   Problem List Items Addressed This Visit    None    Visit Diagnoses    Facial cellulitis    -  Primary   S/P trauma from COVID test (negative) will check CBC and treat w/ bactrim x10 days. Will refer to ENT to make sure it doesn't go deeper. Warning signs discussed   Relevant Orders   Ambulatory referral to ENT   CBC With Differential/Platelet       Follow up plan: Return Monday if hasn't gotten in with ENT.

## 2020-08-26 LAB — CBC WITH DIFFERENTIAL/PLATELET
Basophils Absolute: 0 10*3/uL (ref 0.0–0.2)
Basos: 1 %
EOS (ABSOLUTE): 0.3 10*3/uL (ref 0.0–0.4)
Eos: 4 %
Hematocrit: 33.9 % — ABNORMAL LOW (ref 34.0–46.6)
Hemoglobin: 11.6 g/dL (ref 11.1–15.9)
Immature Grans (Abs): 0 10*3/uL (ref 0.0–0.1)
Immature Granulocytes: 0 %
Lymphocytes Absolute: 1.8 10*3/uL (ref 0.7–3.1)
Lymphs: 31 %
MCH: 33.8 pg — ABNORMAL HIGH (ref 26.6–33.0)
MCHC: 34.2 g/dL (ref 31.5–35.7)
MCV: 99 fL — ABNORMAL HIGH (ref 79–97)
Monocytes Absolute: 0.7 10*3/uL (ref 0.1–0.9)
Monocytes: 13 %
Neutrophils Absolute: 2.9 10*3/uL (ref 1.4–7.0)
Neutrophils: 51 %
Platelets: 185 10*3/uL (ref 150–450)
RBC: 3.43 x10E6/uL — ABNORMAL LOW (ref 3.77–5.28)
RDW: 12.1 % (ref 11.7–15.4)
WBC: 5.7 10*3/uL (ref 3.4–10.8)

## 2020-08-28 IMAGING — CT CT CHEST LUNG CANCER SCREENING LOW DOSE W/O CM
2 of 5 series · 15 of 40 positions shown, 18 images · non-contrast
Comparison: 02/21/2018

CLINICAL DATA: 68-year-old female with 85 pack-year history of
smoking. Lung cancer screening.

EXAM:
CT CHEST WITHOUT CONTRAST LOW-DOSE FOR LUNG CANCER SCREENING
TECHNIQUE: Multidetector CT imaging of the chest was performed following the
standard protocol without IV contrast.

[Series 3: lung 1.00 · axial · 0.47mm/px · z∈[+1436,+1719]mm · 12 of 313 slices shown, 15 images]
[im 15/313  mediastinal]
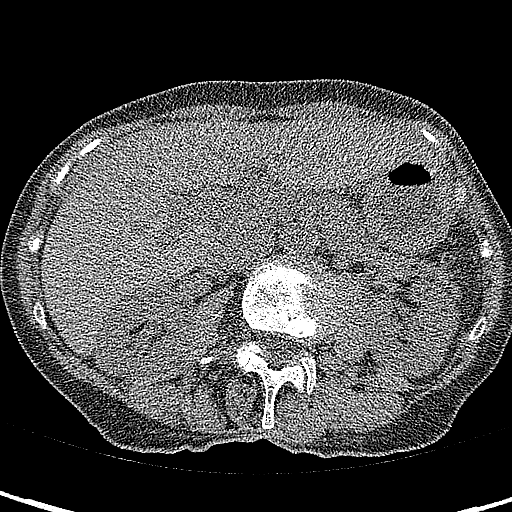
[im 15/313  lung]
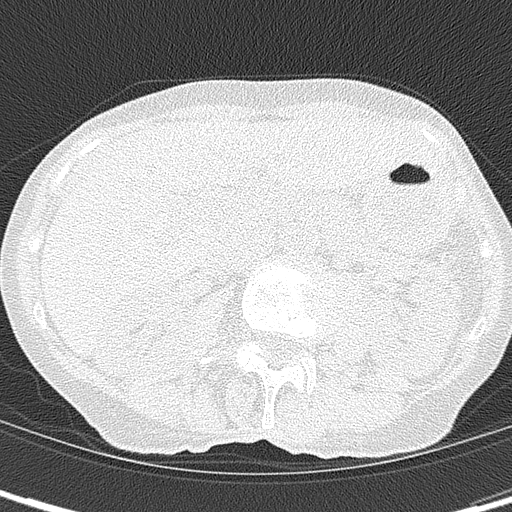
[im 43/313  lung]
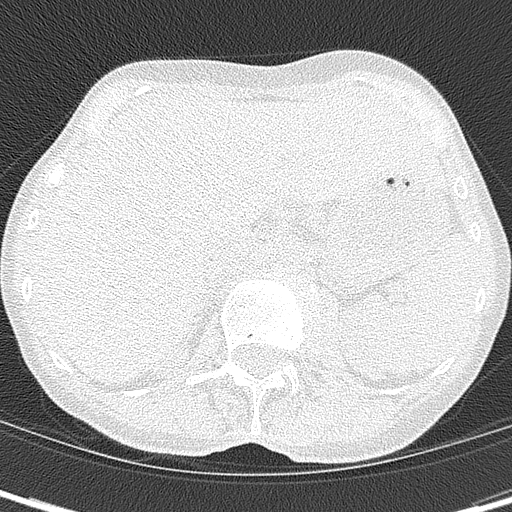
[im 71/313  lung]
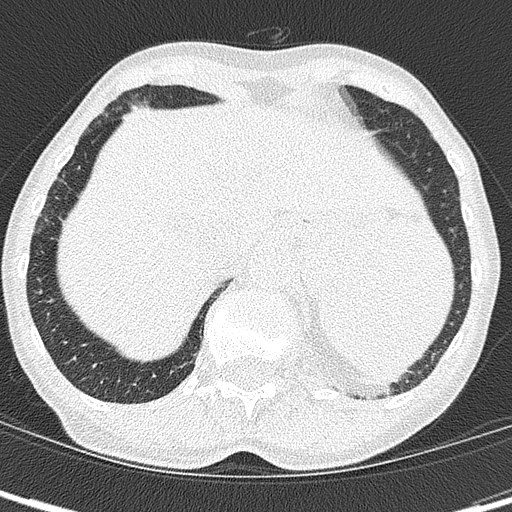
[im 100/313  lung]
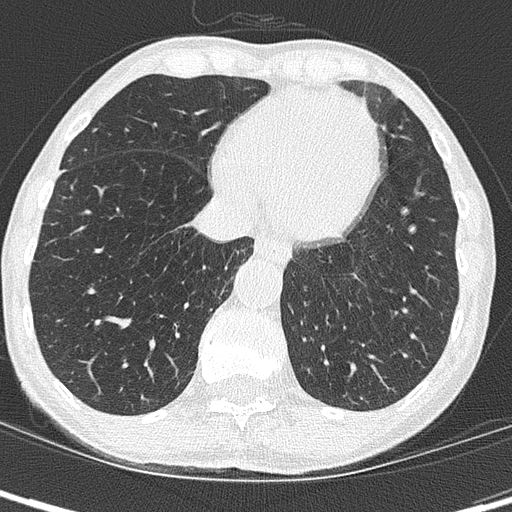
[im 114/313  mediastinal]
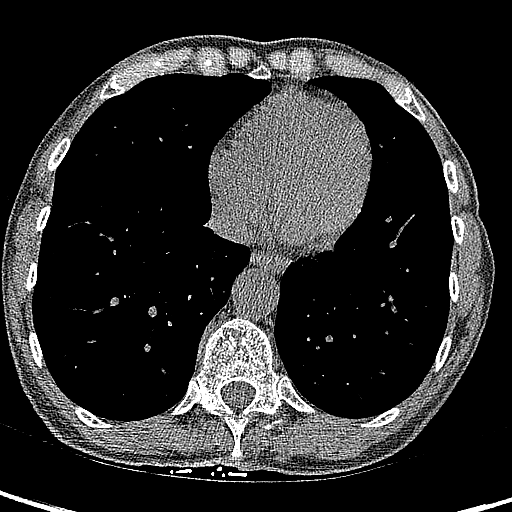
[im 114/313  lung]
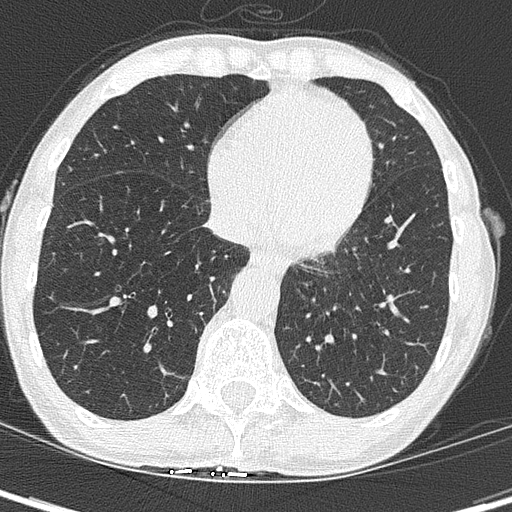
[im 142/313  lung]
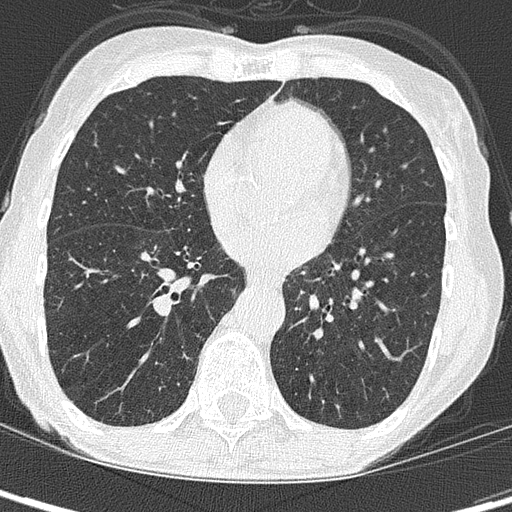
[im 171/313  lung]
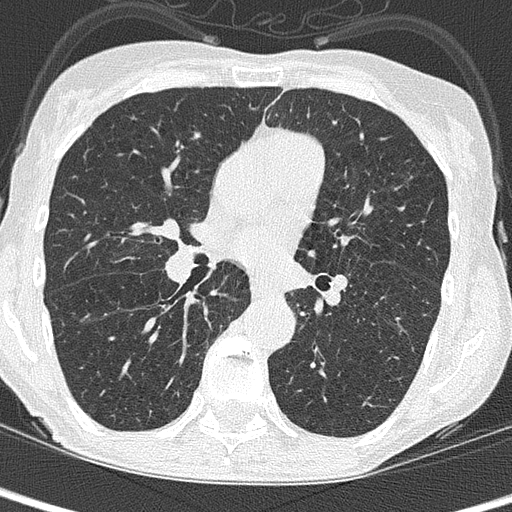
[im 199/313  lung]
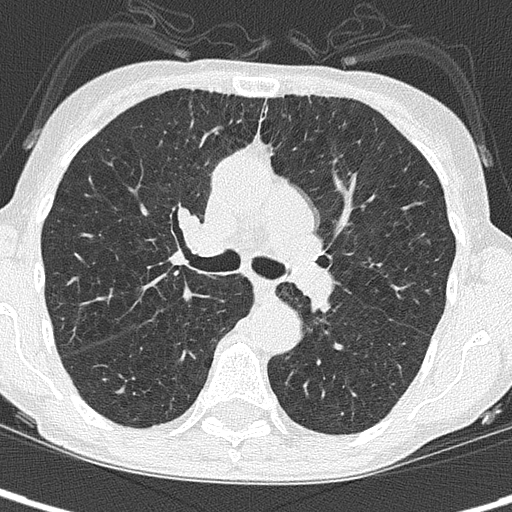
[im 213/313  mediastinal]
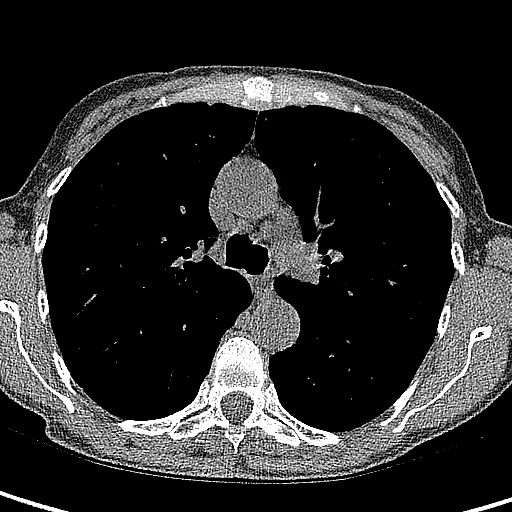
[im 213/313  lung]
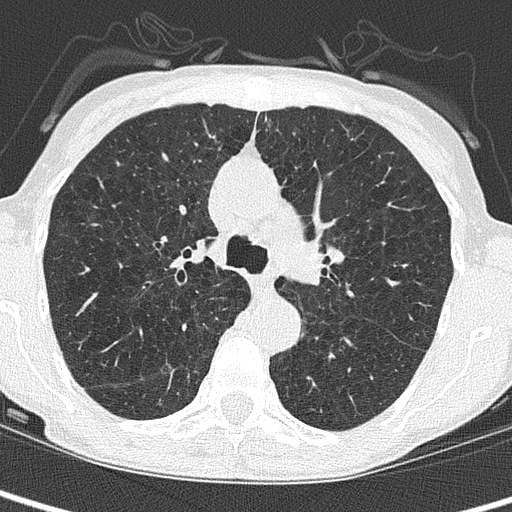
[im 242/313  lung]
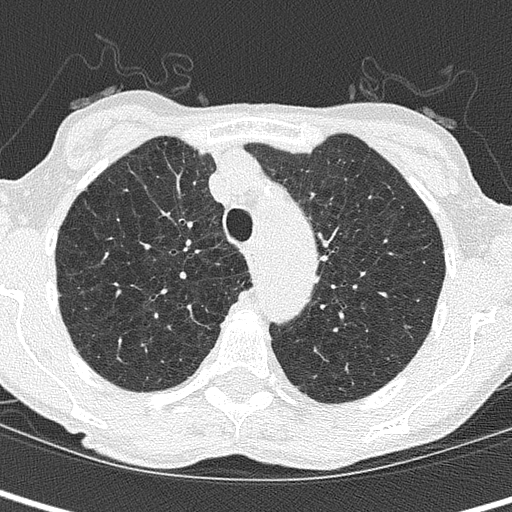
[im 270/313  lung]
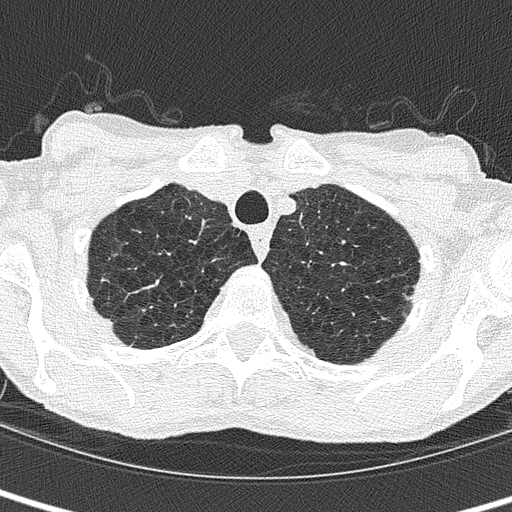
[im 298/313  lung]
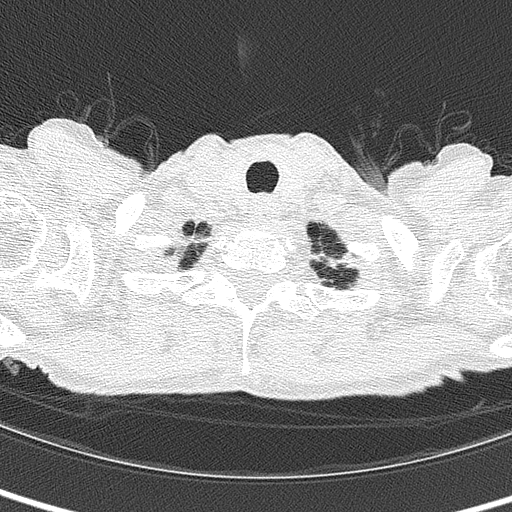

[Series 4: coronals lung 1.00 cor · coronal · 0.47mm/px · 3 of 214 slices shown]
[im 43/214  lung]
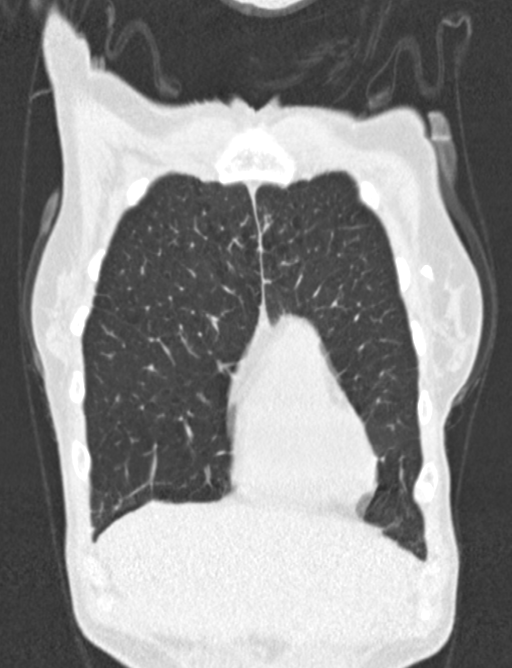
[im 86/214  lung]
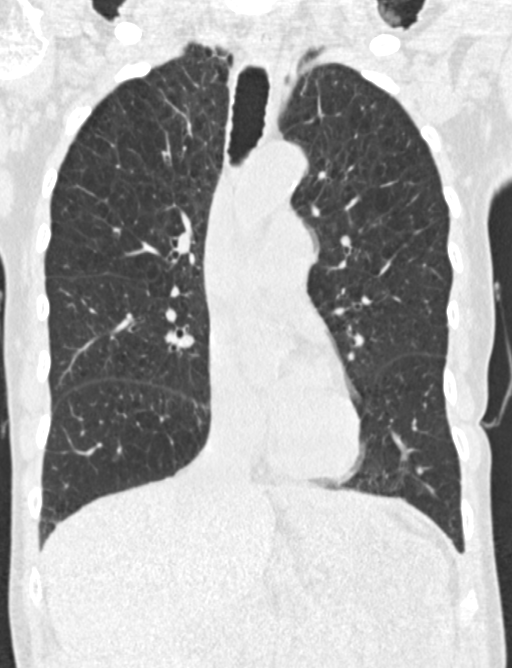
[im 128/214  lung]
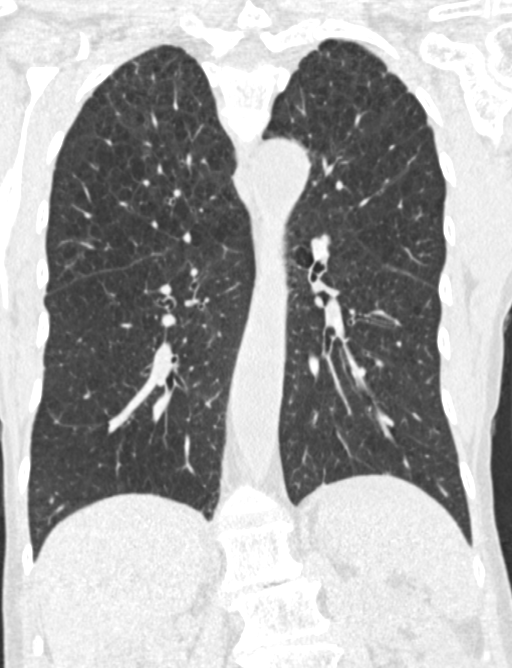

[15 of 40 positions shown; findings below may reference images not displayed]

FINDINGS: Cardiovascular: The heart size is normal. No substantial pericardial
effusion. Atherosclerotic calcification is noted in the wall of the
thoracic aorta.

Mediastinum/Nodes: No mediastinal lymphadenopathy. No evidence for
gross hilar lymphadenopathy although assessment is limited by the
lack of intravenous contrast on today's study. The esophagus has
normal imaging features. There is no axillary lymphadenopathy.

Lungs/Pleura: Centrilobular and paraseptal emphysema evident. Tiny
pulmonary nodules identified previously are stable. No new
suspicious pulmonary nodule or mass. No focal airspace
consolidation. No pleural effusion.

Upper Abdomen: Unremarkable.

Musculoskeletal: No worrisome lytic or sclerotic osseous
abnormality.
IMPRESSION: 1. Lung-RADS 2, benign appearance or behavior. Continue annual
screening with low-dose chest CT without contrast in 12 months.
2. Aortic Atherosclerosis (CVU8A-AFN.N) and Emphysema (CVU8A-D99.A).

## 2020-09-03 ENCOUNTER — Ambulatory Visit (INDEPENDENT_AMBULATORY_CARE_PROVIDER_SITE_OTHER): Payer: Medicare Other | Admitting: General Practice

## 2020-09-03 ENCOUNTER — Telehealth: Payer: Self-pay | Admitting: General Practice

## 2020-09-03 DIAGNOSIS — F419 Anxiety disorder, unspecified: Secondary | ICD-10-CM

## 2020-09-03 DIAGNOSIS — L03211 Cellulitis of face: Secondary | ICD-10-CM

## 2020-09-03 DIAGNOSIS — J439 Emphysema, unspecified: Secondary | ICD-10-CM

## 2020-09-03 DIAGNOSIS — Z20822 Contact with and (suspected) exposure to covid-19: Secondary | ICD-10-CM

## 2020-09-03 DIAGNOSIS — B182 Chronic viral hepatitis C: Secondary | ICD-10-CM

## 2020-09-03 DIAGNOSIS — I2510 Atherosclerotic heart disease of native coronary artery without angina pectoris: Secondary | ICD-10-CM

## 2020-09-03 NOTE — Patient Instructions (Signed)
Visit Information  Goals Addressed              This Visit's Progress     RNCM: Enhance My Mental Skills        Follow Up Date 11-05-2020   - stay in touch with my family and friends - take a walk daily and think about what I am seeing    Why is this important?   As we age, or sometimes because we have an illness, it feels like our memory and ability to figure things out is not very good.  There are things you can do to keep your memory and your thinking as strong as possible.    Notes: Patient notes a decline in memory. Facilitating thought process will be good for recall and enhancing memory.      RNCM: Learn More About My Health        Follow Up Date 11-05-2020   - tell my story and reason for my visit - make a list of questions - ask questions - repeat what I heard to make sure I understand - bring a list of my medicines to the visit - speak up when I don't understand    Why is this important?   The best way to learn about your health and care is by talking to the doctor and nurse.  They will answer your questions and give you information in the way that you like best.    Notes: Encouragement needed for the patient to write down questions and ask questions at her visits. Reminder to be active in her health essential.       RNCM: Make and Keep All Appointments        Follow Up Date 11/05/2020   - ask family or friend for a ride - call to cancel if needed - keep a calendar with prescription refill dates - keep a calendar with appointment dates - use public transportation    Why is this important?   Part of staying healthy is seeing the doctor for follow-up care.  If you forget your appointments, there are some things you can do to stay on track.    Notes: The patient was a no-show to her appointment with Dr. Servando Snare on 08-12-2020.  The patient states that she thought she had COVID and that is why she did not go. She did not rescheduled. She did get a letter from Dr.  Servando Snare recently that said she did not need further follow up.       RNCM: Manage My Emotions        Follow Up Date 11-05-2020   - call and visit an old friend - learn and use visualization or guided imagery - practice relaxation or meditation daily - talk about feelings with a friend, family or spiritual advisor - practice positive thinking and self-talk    Why is this important?   When you are stressed, down or upset, your body reacts too.  For example, your blood pressure may get higher; you may have a headache or stomachache.  When your emotions get the best of you, your body's ability to fight off cold and flu gets weak.  These steps will help you manage your emotions.     Notes: The patient is good about expressing her emotions. Feels she does a good job at this.       RNCM: Matintain My Quality of Life        Follow Up Date 11-05-2020   -  complete a living will - discuss my treatment options with the doctor or nurse - do one enjoyable thing every day - share memories using a picture album or scrapbook with my loved ones - spend time outdoors at least 3 times a week - strengthen or fix relationships with loved ones    Why is this important?   Having a long-term illness can be scary.  It can also be stressful for you and your caregiver.  These steps may help.    Notes: Has good family support system      RNCM: Protect My Health        Follow Up Date 11-05-2020   - schedule appointment for flu shot - schedule appointment for vaccines needed due to my age or health - schedule recommended health tests (blood work, mammogram, colonoscopy, pap test) - schedule and keep appointment for annual check-up    Why is this important?   Screening tests can find diseases early when they are easier to treat.  Your doctor or nurse will talk with you about which tests are important for you.  Getting shots for common diseases like the flu and shingles will help prevent them.       Notes: The patient wants to get the flu shot. Has had both COVID vaccinations. Will consider the booster if needed.       RNCM: Pt-"I want to take better care of myself" (pt-stated)        CARE PLAN ENTRY (see longtitudinal plan of care for additional care plan information)  Current Barriers:   Chronic Disease Management support, education, and care coordination needs related to CAD, Anxiety, and Pulmonary Disease  Clinical Goal(s) related to CAD, Anxiety, and Pulmonary Disease:  Over the next 120 days, patient will:   Work with the care management team to address educational, disease management, and care coordination needs   Begin or continue self health monitoring activities as directed today  adhere to a heart healthy diet and utilize effective coping mechanisms for anxiety and stress  Call provider office for new or worsened signs and symptoms Shortness of breath and New or worsened symptom related to CAD and Anxiety  Call care management team with questions or concerns  Verbalize basic understanding of patient centered plan of care established today  Interventions related to CAD, Anxiety, and Pulmonary Disease:   Evaluation of current treatment plans and patient's adherence to plan as established by provider.  The patient states that she wants to take care of herself and she wants to follow the instructions of her doctors. She is very forgetful and needs redirection at times. Education and support.   Assessed patient understanding of disease states.  The patient has had issues with depression and anxiety recently as her only sibling passed away unexpectedly on May 30, 2023.  The patient states she has been having a hard time but is thankful for her niece being there with her. 09-03-2020: The patient recently thought she had COVID19 but did not. She was tested and when they tested her it made her nose and face swell up. The patient states that she is feeling much better now. Saw dr.  Laural Benes on 08-13-2020 and 08-25-2020  Assessed patient's education and care coordination needs.  Continued education and support on chronic disease processes and needs of the patient. The patient is thankful for the CCM team working with her.   Provided disease specific education to patient.  Education on eating healthy and monitoring for weight loss.  The patient states that she had just gotten up from a nap. She feels she is doing a lot better. She also says things are a lot better since she her nieces boyfriend moved out.   Collaborated with appropriate clinical care team members regarding patient needs.  Will let the LCSW know that the patients sister passed and the hard time she has been having. 07-28-2020: The patient is working with the LCSW for expressed needs and ongoing support and education.   Evaluation of upcoming appointments. The patient does not have any upcoming appointments. Saw Dr. Laural Benes on 9-10- and 08-25-2020.  Will continue to monitor.   Patient Self Care Activities related to CAD, Anxiety, and Pulmonary Disease:   Patient is unable to independently self-manage chronic health conditions  Please see past updates related to this goal by clicking on the "Past Updates" button in the selected goal        RNCM: Pt-"They called yesterday and changed my appointment" (pt-stated)        CARE PLAN ENTRY (see longtitudinal plan of care for additional care plan information)  Current Barriers:   Knowledge Deficits related to Hepatitis C and the importance of continued care and treatment for Hepatitis C  Financial Constraints.   Transportation barriers  Non-adherence to scheduled provider appointments  Nurse Case Manager Clinical Goal(s):   Over the next 120 days, patient will verbalize understanding of plan for Hepatitis C and management of Hepatitis C  Over the next 120 days, patient will work with RNCM, pcp, and CCM team to address needs related to Hepatitis C  Over the  next 120 days, patient will demonstrate a decrease in Hepatitis C exacerbations as evidenced by attending GI appointments and following plan of care for Hepatitis C  Over the next 120 days, patient will attend all scheduled medical appointments: the patients appointment with Dr. Midge Minium was rescheduled from 03/10/2020 to 03/11/2020 at 130pm.   Over the next 120 days, patient will verbalize basic understanding of Hepatitis C disease process and self health management plan as evidenced by compliance with prescribed treatment plan  Over the next 120 days, patient will work with CM clinical social worker to help with anxiety and depression  Over the next 120 days, patient will work with care guides  to help with food resources such as [employer]   Over the next 120 days, the patient will demonstrate ongoing self health care management ability as evidenced by compliance with medication regimen and plan of care for management of Hepatitis C  Interventions:   Evaluation of current treatment plan related to Hepatitis C and patient's adherence to plan as established by provider.  Provided disease specific education to patient.  Education on follow up with GI MD for recommendations on managing her Hepatitis C and follow up with pcp for chronic disease management.  09-03-2020:  The patient was unable to go to her appointment on 08-12-2020 because she thought she was COVID positive. She was not but she did not go. The patient states that she got a letter from Dr. Servando Snare office stating that she did not need further treatment. Encouraged the patient to follow up regularly with the GI provider in managing her Hepatitis C.   Advised patient to call for changes in appointments or transportation needs   Provided education to patient re: Hepatitis C, taking medications with her to her appointment and writing down questions to ask the provider  Collaborated with Dr. Servando Snare office  regarding patients  appointment on 03/11/2020. Directions given to come to [address], [address], [place], [place] [address].  The patient verbalized her friend is taking her to the appointment tomorrow. Education on the importance of going to the appointment as scheduled. The patient confirmed she is going to her appointment.  Her niece had a talk with her and told her how important it was for her to go also. 06-09-2020: the patient went to her appointment. She says they want to treat her for her liver. She said they were supposed to send her paperwork.  She has not heard from them. Recommended she call their office and talk to the office about follow up. 07-28-2020: The patient states she has not heard from the GI provider. Will see about contacting  Dr. Annabell Sabal office to get the patient a follow up appointment. Call made to Dr. Annabell Sabal office, the [facility] was unable to talk to staff but a detailed message left asking for the office to call the patient to get a follow up appointment in place to come back and see Dr. Servando Snare.  RNCM information also left on the voice mail for additional assistance in helping the patient get needed appointment.   Discussed plans with patient for ongoing care management follow up and provided patient with direct contact information for care management team  Reviewed scheduled/upcoming provider appointments including: The patient missed her appointment with Dr. Servando Snare on 08-12-2020. She states she received a letter and does not have to go back to see him. Encouraged the patient to see GI MD for management of Hepatitis C.  Social Work referral for patient need for coping mechanisms. Patient tearful during the call but thankful for the support of the CCM team.   Patient Self Care Activities:   Patient verbalizes understanding of plan to manage Hepatitis C by attending all appointments and following the plan of care established by the provider  Self administers medications as prescribed  Attends all scheduled  provider appointments  Calls provider office for new concerns or questions  Unable to independently manage Hepatitis C as evidence of multiple missed appointments   Does not attend all scheduled provider appointments  Please see past updates related to this goal by clicking on the "Past Updates" button in the selected goal         Patient verbalizes understanding of instructions provided today.   Telephone follow up appointment with care management team member scheduled for: 11-05-2020 at 3:30 pm  Alto Denver RN, MSN, CCM Community Care Coordinator [place]   [facility] Mobile: [phone]

## 2020-09-03 NOTE — Chronic Care Management (AMB) (Signed)
Chronic Care Management   Follow Up Note   09/03/2020 Name: Vanessa Oneal MRN: [id number] DOB: [birth year]  Referred by: Dorcas Carrow, DO Reason for referral : Chronic Care Management (RNCM: Follow up for Chronic Disease Management and Care Coordination Needs)   Vanessa Oneal is a 69 y.o. year old female who is a primary care patient of Dorcas Carrow, DO. The CCM team was consulted for assistance with chronic disease management and care coordination needs.    Review of patient status, including review of consultants reports, relevant laboratory and other test results, and collaboration with appropriate care team members and the patient's provider was performed as part of comprehensive patient evaluation and provision of chronic care management services.    SDOH (Social Determinants of Health) assessments performed: Yes See Care Plan activities for detailed interventions related to [facility])     Outpatient Encounter Medications as of 09/03/2020  Medication Sig   busPIRone (BUSPAR) 15 MG tablet Take 15 mg by mouth at bedtime. (Patient not taking: Reported on 08/13/2020)   Glecaprevir-Pibrentasvir (MAVYRET) 100-40 MG TABS Take 3 tablets by mouth daily. (Patient not taking: Reported on 06/14/2020)   SUBOXONE 8-2 MG FILM Take 1 Film by mouth 2 (two) times daily.    sulfamethoxazole-trimethoprim (BACTRIM DS) 800-160 MG tablet Take 1 tablet by mouth 2 (two) times daily.   Vitamin D, Ergocalciferol, (DRISDOL) 1.25 MG (50000 UNIT) CAPS capsule Take 1 capsule (50,000 Units total) by mouth every 7 (seven) days. (Patient not taking: Reported on 06/14/2020)   No facility-administered encounter medications on file as of 09/03/2020.     Objective:  BP Readings from Last 3 Encounters:  08/25/20 116/71  06/14/20 120/70  03/11/20 108/67    Goals Addressed              This Visit's Progress     RNCM: Enhance My Mental Skills        Follow Up Date 11-05-2020   - stay in touch with  my family and friends - take a walk daily and think about what I am seeing    Why is this important?   As we age, or sometimes because we have an illness, it feels like our memory and ability to figure things out is not very good.  There are things you can do to keep your memory and your thinking as strong as possible.    Notes: Patient notes a decline in memory. Facilitating thought process will be good for recall and enhancing memory.      RNCM: Learn More About My Health        Follow Up Date 11-05-2020   - tell my story and reason for my visit - make a list of questions - ask questions - repeat what I heard to make sure I understand - bring a list of my medicines to the visit - speak up when I don't understand    Why is this important?   The best way to learn about your health and care is by talking to the doctor and nurse.  They will answer your questions and give you information in the way that you like best.    Notes: Encouragement needed for the patient to write down questions and ask questions at her visits. Reminder to be active in her health essential.       RNCM: Make and Keep All Appointments        Follow Up Date 11/05/2020   - ask family  or friend for a ride - call to cancel if needed - keep a calendar with prescription refill dates - keep a calendar with appointment dates - use public transportation    Why is this important?   Part of staying healthy is seeing the doctor for follow-up care.  If you forget your appointments, there are some things you can do to stay on track.    Notes: The patient was a no-show to her appointment with Dr. Servando Snare on 08-12-2020.  The patient states that she thought she had COVID and that is why she did not go. She did not rescheduled. She did get a letter from Dr. Servando Snare recently that said she did not need further follow up.       RNCM: Manage My Emotions        Follow Up Date 11-05-2020   - call and visit an old friend - learn and  use visualization or guided imagery - practice relaxation or meditation daily - talk about feelings with a friend, family or spiritual advisor - practice positive thinking and self-talk    Why is this important?   When you are stressed, down or upset, your body reacts too.  For example, your blood pressure may get higher; you may have a headache or stomachache.  When your emotions get the best of you, your body's ability to fight off cold and flu gets weak.  These steps will help you manage your emotions.     Notes: The patient is good about expressing her emotions. Feels she does a good job at this.       RNCM: Matintain My Quality of Life        Follow Up Date 11-05-2020   - complete a living will - discuss my treatment options with the doctor or nurse - do one enjoyable thing every day - share memories using a picture album or scrapbook with my loved ones - spend time outdoors at least 3 times a week - strengthen or fix relationships with loved ones    Why is this important?   Having a long-term illness can be scary.  It can also be stressful for you and your caregiver.  These steps may help.    Notes: Has good family support system      RNCM: Protect My Health        Follow Up Date 11-05-2020   - schedule appointment for flu shot - schedule appointment for vaccines needed due to my age or health - schedule recommended health tests (blood work, mammogram, colonoscopy, pap test) - schedule and keep appointment for annual check-up    Why is this important?   Screening tests can find diseases early when they are easier to treat.  Your doctor or nurse will talk with you about which tests are important for you.  Getting shots for common diseases like the flu and shingles will help prevent them.     Notes: The patient wants to get the flu shot. Has had both COVID vaccinations. Will consider the booster if needed.       RNCM: Pt-"I want to take better care of myself"  (pt-stated)        CARE PLAN ENTRY (see longtitudinal plan of care for additional care plan information)  Current Barriers:   Chronic Disease Management support, education, and care coordination needs related to CAD, Anxiety, and Pulmonary Disease  Clinical Goal(s) related to CAD, Anxiety, and Pulmonary Disease:  Over the next 120 days,  patient will:   Work with the care management team to address educational, disease management, and care coordination needs   Begin or continue self health monitoring activities as directed today  adhere to a heart healthy diet and utilize effective coping mechanisms for anxiety and stress  Call provider office for new or worsened signs and symptoms Shortness of breath and New or worsened symptom related to CAD and Anxiety  Call care management team with questions or concerns  Verbalize basic understanding of patient centered plan of care established today  Interventions related to CAD, Anxiety, and Pulmonary Disease:   Evaluation of current treatment plans and patient's adherence to plan as established by provider.  The patient states that she wants to take care of herself and she wants to follow the instructions of her doctors. She is very forgetful and needs redirection at times. Education and support.   Assessed patient understanding of disease states.  The patient has had issues with depression and anxiety recently as her only sibling passed away unexpectedly on 05-23-2023.  The patient states she has been having a hard time but is thankful for her niece being there with her. 09-03-2020: The patient recently thought she had COVID19 but did not. She was tested and when they tested her it made her nose and face swell up. The patient states that she is feeling much better now. Saw dr. Laural Benes on 08-13-2020 and 08-25-2020  Assessed patient's education and care coordination needs.  Continued education and support on chronic disease processes and needs of the  patient. The patient is thankful for the CCM team working with her.   Provided disease specific education to patient.  Education on eating healthy and monitoring for weight loss. The patient states that she had just gotten up from a nap. She feels she is doing a lot better. She also says things are a lot better since she her nieces boyfriend moved out.   Collaborated with appropriate clinical care team members regarding patient needs.  Will let the LCSW know that the patients sister passed and the hard time she has been having. 07-28-2020: The patient is working with the LCSW for expressed needs and ongoing support and education.   Evaluation of upcoming appointments. The patient does not have any upcoming appointments. Saw Dr. Laural Benes on 9-10- and 08-25-2020.  Will continue to monitor.   Patient Self Care Activities related to CAD, Anxiety, and Pulmonary Disease:   Patient is unable to independently self-manage chronic health conditions  Please see past updates related to this goal by clicking on the "Past Updates" button in the selected goal        RNCM: Pt-"They called yesterday and changed my appointment" (pt-stated)        CARE PLAN ENTRY (see longtitudinal plan of care for additional care plan information)  Current Barriers:   Knowledge Deficits related to Hepatitis C and the importance of continued care and treatment for Hepatitis C  Financial Constraints.   Transportation barriers  Non-adherence to scheduled provider appointments  Nurse Case Manager Clinical Goal(s):   Over the next 120 days, patient will verbalize understanding of plan for Hepatitis C and management of Hepatitis C  Over the next 120 days, patient will work with RNCM, pcp, and CCM team to address needs related to Hepatitis C  Over the next 120 days, patient will demonstrate a decrease in Hepatitis C exacerbations as evidenced by attending GI appointments and following plan of care for Hepatitis C  Over  the  next 120 days, patient will attend all scheduled medical appointments: the patients appointment with Dr. Midge Minium was rescheduled from 03/10/2020 to 03/11/2020 at 130pm.   Over the next 120 days, patient will verbalize basic understanding of Hepatitis C disease process and self health management plan as evidenced by compliance with prescribed treatment plan  Over the next 120 days, patient will work with CM clinical social worker to help with anxiety and depression  Over the next 120 days, patient will work with care guides  to help with food resources such as [employer]   Over the next 120 days, the patient will demonstrate ongoing self health care management ability as evidenced by compliance with medication regimen and plan of care for management of Hepatitis C  Interventions:   Evaluation of current treatment plan related to Hepatitis C and patient's adherence to plan as established by provider.  Provided disease specific education to patient.  Education on follow up with GI MD for recommendations on managing her Hepatitis C and follow up with pcp for chronic disease management.  09-03-2020:  The patient was unable to go to her appointment on 08-12-2020 because she thought she was COVID positive. She was not but she did not go. The patient states that she got a letter from Dr. Servando Snare office stating that she did not need further treatment. Encouraged the patient to follow up regularly with the GI provider in managing her Hepatitis C.   Advised patient to call for changes in appointments or transportation needs   Provided education to patient re: Hepatitis C, taking medications with her to her appointment and writing down questions to ask the provider  Collaborated with Dr. Servando Snare office  regarding patients appointment on 03/11/2020. Directions given to come to [address], [address], [place], [place] [address].  The patient verbalized her friend is taking her to the appointment tomorrow.  Education on the importance of going to the appointment as scheduled. The patient confirmed she is going to her appointment.  Her niece had a talk with her and told her how important it was for her to go also. 06-09-2020: the patient went to her appointment. She says they want to treat her for her liver. She said they were supposed to send her paperwork.  She has not heard from them. Recommended she call their office and talk to the office about follow up. 07-28-2020: The patient states she has not heard from the GI provider. Will see about contacting  Dr. Annabell Sabal office to get the patient a follow up appointment. Call made to Dr. Annabell Sabal office, the [facility] was unable to talk to staff but a detailed message left asking for the office to call the patient to get a follow up appointment in place to come back and see Dr. Servando Snare.  RNCM information also left on the voice mail for additional assistance in helping the patient get needed appointment.   Discussed plans with patient for ongoing care management follow up and provided patient with direct contact information for care management team  Reviewed scheduled/upcoming provider appointments including: The patient missed her appointment with Dr. Servando Snare on 08-12-2020. She states she received a letter and does not have to go back to see him. Encouraged the patient to see GI MD for management of Hepatitis C.  Social Work referral for patient need for coping mechanisms. Patient tearful during the call but thankful for the support of the CCM team.   Patient Self Care Activities:  Patient verbalizes understanding of plan to manage Hepatitis C by attending all appointments and following the plan of care established by the provider  Self administers medications as prescribed  Attends all scheduled provider appointments  Calls provider office for new concerns or questions  Unable to independently manage Hepatitis C as evidence of multiple missed appointments   Does not  attend all scheduled provider appointments  Please see past updates related to this goal by clicking on the "Past Updates" button in the selected goal          Plan:   Telephone follow up appointment with care management team member scheduled for: 11-05-2020 at 3:30 pm   Alto Denver RN, MSN, CCM Community Care Coordinator [place]   [facility] Mobile: [phone]

## 2020-10-22 DIAGNOSIS — Z720 Tobacco use: Secondary | ICD-10-CM | POA: Diagnosis not present

## 2020-11-05 ENCOUNTER — Telehealth: Payer: Self-pay | Admitting: General Practice

## 2020-11-05 ENCOUNTER — Telehealth: Payer: Self-pay

## 2020-11-05 NOTE — Telephone Encounter (Signed)
°  Chronic Care Management   Outreach Note  11/05/2020 Name: Vanessa Oneal MRN: [id number] DOB: [birth year]  Referred by: Dorcas Carrow, DO Reason for referral : Chronic Care Management (RNCM Follow up call: Chronic Disease Management and Care Coordination Needs)   An unsuccessful telephone outreach was attempted today. The patient was referred to the case management team for assistance with care management and care coordination.   Follow Up Plan: The care management team will reach out to the patient again over the next 30 to 60 days.   Alto Denver RN, MSN, CCM Community Care Coordinator [place]   [facility] Mobile: [phone]

## 2020-11-19 DIAGNOSIS — F17200 Nicotine dependence, unspecified, uncomplicated: Secondary | ICD-10-CM | POA: Diagnosis not present

## 2020-12-08 ENCOUNTER — Telehealth: Payer: Self-pay

## 2020-12-08 NOTE — Chronic Care Management (AMB) (Signed)
  Care Management   Note  12/08/2020 Name: Vanessa Oneal MRN: [id number] DOB: Feb 24, 1951  Vanessa Oneal is a 70 y.o. year old female who is a primary care patient of Dorcas Carrow, DO and is actively engaged with the care management team. I reached out to [name] by phone today to assist with re-scheduling a follow up visit with the RN Case Manager  Follow up plan: Unsuccessful telephone outreach attempt made. The care management team will reach out to the patient again over the next 7 days.  If patient returns call to provider office, please advise to call Embedded Care Management Care Guide Penne Lash  at [phone]  Penne Lash, RMA Care Guide, Embedded Care Coordination [facility]  [place], [place] [address] Direct Dial: [phone] [email or web address] Website: [email or web address]

## 2020-12-20 ENCOUNTER — Ambulatory Visit: Payer: Self-pay | Admitting: *Deleted

## 2020-12-20 NOTE — Telephone Encounter (Signed)
Patient reports "knot" noted behind right knee of back of right leg. Denies swelling in calf or ankle. Pain reported no redness or hot to touch. Denies fever or difficulty breathing. Pain and swelling noted x 1 week. C/o right wrist swelling and pain. Can move right wrist and right knee and leg. appt scheduled for 12/27/20. Patient requesting to see PCP earlier if possible. Care advise given. Patient verbalized understanding of care advise and to call back or go to [facility] or ED if symptoms worsen.   Reason for Disposition . [1] MODERATE pain (e.g., interferes with normal activities, limping) AND [2] present > 3 days  Answer Assessment - Initial Assessment Questions 1. LOCATION: "Where is the swelling located?"  (e.g., left, right, both knees)     "knot" of back of right knee.  2. SIZE and DESCRIPTION: "What does the swelling look like?"  (e.g., entire knee, localized)     Swelling  3. ONSET: "When did the swelling start?" "Does it come and go, or is it there all the time?"     1 week ago  4. PAIN: "Is there any pain?" If Yes, ask: "How bad is it?" (Scale 1-10; or mild, moderate, severe)     Yes  5. SETTING: "Has there been any recent work, exercise or other activity that involved that part of the body?"      no 6. AGGRAVATING FACTORS: "What makes the knee swelling worse?" (e.g., walking, climbing stairs, running)     Walking  7. ASSOCIATED SYMPTOMS: "Is there any pain or redness?"     Pain when extending leg 8. OTHER SYMPTOMS: "Do you have any other symptoms?" (e.g., chest pain, difficulty breathing, fever, calf pain)      Pain in right wrist, SOB with exertion,  9. PREGNANCY: "Is there any chance you are pregnant?" "When was your last menstrual period?"     na  Protocols used: [facility]

## 2020-12-20 NOTE — Telephone Encounter (Signed)
FYI scheduled for 1/20

## 2020-12-22 NOTE — Chronic Care Management (AMB) (Signed)
  Care Management   Note  12/22/2020 Name: Vanessa Oneal MRN: [id number] DOB: 1951-07-30  Vanessa Oneal is a 70 y.o. year old female who is a primary care patient of Dorcas Carrow, DO and is actively engaged with the care management team. I reached out to [name] by phone today to assist with re-scheduling a follow up visit with the RN Case Manager  Follow up plan: Unsuccessful telephone outreach attempt made. The care management team will reach out to the patient again over the next 7 days.  If patient returns call to provider office, please advise to call Embedded Care Management Care Guide Penne Lash  at [phone]  Penne Lash, RMA Care Guide, Embedded Care Coordination [facility]  [place], [place] [address] Direct Dial: [phone] [email or web address]: [email or web address]

## 2020-12-23 ENCOUNTER — Ambulatory Visit (INDEPENDENT_AMBULATORY_CARE_PROVIDER_SITE_OTHER): Payer: 59 | Admitting: Family Medicine

## 2020-12-23 ENCOUNTER — Other Ambulatory Visit: Payer: Self-pay

## 2020-12-23 ENCOUNTER — Encounter: Payer: Self-pay | Admitting: Family Medicine

## 2020-12-23 VITALS — Temp 98.7°F | Wt 86.8 lb

## 2020-12-23 DIAGNOSIS — E559 Vitamin D deficiency, unspecified: Secondary | ICD-10-CM | POA: Diagnosis not present

## 2020-12-23 DIAGNOSIS — F419 Anxiety disorder, unspecified: Secondary | ICD-10-CM | POA: Diagnosis not present

## 2020-12-23 DIAGNOSIS — H539 Unspecified visual disturbance: Secondary | ICD-10-CM

## 2020-12-23 DIAGNOSIS — R252 Cramp and spasm: Secondary | ICD-10-CM | POA: Diagnosis not present

## 2020-12-23 MED ORDER — VORTIOXETINE HBR 5 MG PO TABS: 5.0000 mg | ORAL_TABLET | Freq: Every day | ORAL | 3 refills | Status: DC

## 2020-12-23 MED ORDER — ONDANSETRON 4 MG PO TBDP: 4.0000 mg | ORAL_TABLET | Freq: Three times a day (TID) | ORAL | 3 refills | Status: DC | PRN

## 2020-12-23 MED ORDER — DICLOFENAC SODIUM 1 % EX GEL: 4.0000 g | Freq: Four times a day (QID) | CUTANEOUS | 6 refills | Status: DC

## 2020-12-23 NOTE — Progress Notes (Signed)
Temp 98.7 F (37.1 C)   Wt 86 lb 12.8 oz (39.4 kg)   SpO2 95%   BMI 16.95 kg/m    Subjective:    Patient ID: Vanessa Oneal, female    DOB: 12-04-1951, 70 y.o.   MRN: [id number]  HPI: Vanessa Oneal is a 70 y.o. female  Chief Complaint  Patient presents with  . swelling    Pt states for about a week she has noticed swelling behind her right knee. Has cramps in both legs   . Dizziness    Patient states yesterday she began to feel dizzy    Arthritis has been acting up  DIZZINESS Duration: 1 day Description of symptoms: lightheaded Duration of episode: seconds Dizziness frequency: recurrent Provoking factors: moving her head Triggered by rolling over in bed: unknown Triggered by bending over: yes Aggravated by head movement: yes Aggravated by exertion, coughing, loud noises: yes Recent head injury: no Recent or current viral symptoms: no History of vasovagal episodes: no Nausea: yes Vomiting: no Tinnitus: no Hearing loss: no Aural fullness: no Headache: occasionaly Photophobia/phonophobia: no Unsteady gait: no Postural instability: yes Diplopia, dysarthria, dysphagia or weakness: no Related to exertion: no Pallor: no Diaphoresis: no Dyspnea: no Chest pain: no  ANXIETY/STRESS Duration: chronic Status:uncontrolled Anxious mood: yes  Excessive worrying: yes Irritability: no  Sweating: no Nausea: yes Palpitations:no Hyperventilation: no Panic attacks: no Agoraphobia: no  Obscessions/compulsions: no Depressed mood: yes Depression screen [facility] 2/9 03/10/2020 01/26/2020 02/04/2018 12/11/2017 11/22/2017  Decreased Interest 0 0 1 1 1   Down, Depressed, Hopeless 2 1 1 2 1   PHQ - 2 Score 2 1 2 3 2   Altered sleeping 1 - 0 1 0  Tired, decreased energy 1 - 0 1 1  Change in appetite 1 - 0 1 0  Feeling bad or failure about yourself  0 - 0 0 0  Trouble concentrating 2 - 2 1 1   Moving slowly or fidgety/restless 0 - 0 1 0  Suicidal thoughts 0 - 0 0 0  PHQ-9 Score 7  - 4 8 4   Difficult doing work/chores Somewhat difficult - Somewhat difficult Somewhat difficult Somewhat difficult   Anhedonia: no Weight changes: no Insomnia: no   Hypersomnia: no Fatigue/loss of energy: yes Feelings of worthlessness: no Feelings of guilt: no Impaired concentration/indecisiveness: no Suicidal ideations: no  Crying spells: no Recent Stressors/Life Changes: no   Relationship problems: no   Family stress: no     Financial stress: no    Job stress: no    Recent death/loss: yes  Relevant past medical, surgical, family and social history reviewed and updated as indicated. Interim medical history since our last visit reviewed. Allergies and medications reviewed and updated.  Review of Systems  Constitutional: Negative.   HENT: Negative.   Respiratory: Negative.   Cardiovascular: Negative.   Gastrointestinal: Negative.   Musculoskeletal: Positive for arthralgias. Negative for back pain, gait problem, joint swelling, myalgias, neck pain and neck stiffness.  Psychiatric/Behavioral: Positive for dysphoric mood and sleep disturbance. Negative for agitation, behavioral problems, confusion, decreased concentration, hallucinations, self-injury and suicidal ideas. The patient is nervous/anxious. The patient is not hyperactive.     Per HPI unless specifically indicated above     Objective:    Temp 98.7 F (37.1 C)   Wt 86 lb 12.8 oz (39.4 kg)   SpO2 95%   BMI 16.95 kg/m   Wt Readings from Last 3 Encounters:  12/23/20 86 lb 12.8 oz (39.4 kg)  08/25/20 84  lb (38.1 kg)  07/15/20 80 lb (36.3 kg)   No data found.  Physical Exam Vitals and nursing note reviewed.  Constitutional:      General: She is not in acute distress.    Appearance: Normal appearance. She is not ill-appearing, toxic-appearing or diaphoretic.  HENT:     Head: Normocephalic and atraumatic.     Right Ear: External ear normal.     Left Ear: External ear normal.     Nose: Nose normal.      Mouth/Throat:     Mouth: Mucous membranes are moist.     Pharynx: Oropharynx is clear.  Eyes:     General: No scleral icterus.       Right eye: No discharge.        Left eye: No discharge.     Extraocular Movements: Extraocular movements intact.     Conjunctiva/sclera: Conjunctivae normal.     Pupils: Pupils are equal, round, and reactive to light.  Cardiovascular:     Rate and Rhythm: Normal rate and regular rhythm.     Pulses: Normal pulses.     Heart sounds: Normal heart sounds. No murmur heard. No friction rub. No gallop.   Pulmonary:     Effort: Pulmonary effort is normal. No respiratory distress.     Breath sounds: Normal breath sounds. No stridor. No wheezing, rhonchi or rales.  Chest:     Chest wall: No tenderness.  Musculoskeletal:        General: Normal range of motion.     Cervical back: Normal range of motion and neck supple.  Skin:    General: Skin is warm and dry.     Capillary Refill: Capillary refill takes less than 2 seconds.     Coloration: Skin is not jaundiced or pale.     Findings: No bruising, erythema, lesion or rash.  Neurological:     General: No focal deficit present.     Mental Status: She is alert and oriented to person, place, and time. Mental status is at baseline.  Psychiatric:        Mood and Affect: Mood normal.        Behavior: Behavior normal.        Thought Content: Thought content normal.        Judgment: Judgment normal.     Results for orders placed or performed in visit on 12/23/20  Phosphorus  Result Value Ref Range   Phosphorus 3.8 3.0 - 4.3 mg/dL  Magnesium  Result Value Ref Range   Magnesium 1.9 1.6 - 2.3 mg/dL  TSH  Result Value Ref Range   TSH 1.850 0.450 - 4.500 uIU/mL  Comprehensive metabolic panel  Result Value Ref Range   Glucose 98 65 - 99 mg/dL   BUN 12 8 - 27 mg/dL   Creatinine, Ser 2.44 0.57 - 1.00 mg/dL   GFR calc non Af Amer 84 >59 mL/min/1.73   GFR calc Af Amer 97 >59 mL/min/1.73   BUN/Creatinine Ratio 16  12 - 28   Sodium 138 134 - 144 mmol/L   Potassium 4.3 3.5 - 5.2 mmol/L   Chloride 100 96 - 106 mmol/L   CO2 23 20 - 29 mmol/L   Calcium 9.1 8.7 - 10.3 mg/dL   Total Protein 6.8 6.0 - 8.5 g/dL   Albumin 4.1 3.8 - 4.8 g/dL   Globulin, Total 2.7 1.5 - 4.5 g/dL   Albumin/Globulin Ratio 1.5 1.2 - 2.2   Bilirubin Total 0.3 0.0 - 1.2  mg/dL   Alkaline Phosphatase 75 44 - 121 IU/L   AST 21 0 - 40 IU/L   ALT 10 0 - 32 IU/L  VITAMIN D 25 Hydroxy (Vit-D Deficiency, Fractures)  Result Value Ref Range   Vit D, 25-Hydroxy 23.9 (L) 30.0 - 100.0 ng/mL      Assessment & Plan:   Problem List Items Addressed This Visit      Other   Anxiety disorder    Will start trintellix. Call with any concerns. Continue to monitor. Recheck 1 month.       Relevant Medications   vortioxetine HBr (TRINTELLIX) 5 MG TABS tablet   Vitamin D deficiency, unspecified    Rechecking labs. Await results. Treat as needed.       Relevant Orders   VITAMIN D 25 Hydroxy (Vit-D Deficiency, Fractures) (Completed)    Other Visit Diagnoses    Leg cramps    -  Primary   WIll check labs. Encouraged eating and drinking. Continue to monitor. Call with any concerns.    Relevant Orders   Phosphorus (Completed)   Magnesium (Completed)   TSH (Completed)   Comprehensive metabolic panel (Completed)   Visual changes       Will get her into opthalmology. Referral generated today. Call with any concerns.    Relevant Orders   Ambulatory referral to Ophthalmology       Follow up plan: Return in about 4 weeks (around 01/20/2021).

## 2020-12-24 ENCOUNTER — Other Ambulatory Visit: Payer: Self-pay | Admitting: Family Medicine

## 2020-12-24 ENCOUNTER — Encounter: Payer: Self-pay | Admitting: Family Medicine

## 2020-12-24 LAB — COMPREHENSIVE METABOLIC PANEL
ALT: 10 IU/L (ref 0–32)
AST: 21 IU/L (ref 0–40)
Albumin/Globulin Ratio: 1.5 (ref 1.2–2.2)
Albumin: 4.1 g/dL (ref 3.8–4.8)
Alkaline Phosphatase: 75 IU/L (ref 44–121)
BUN/Creatinine Ratio: 16 (ref 12–28)
BUN: 12 mg/dL (ref 8–27)
Bilirubin Total: 0.3 mg/dL (ref 0.0–1.2)
CO2: 23 mmol/L (ref 20–29)
Calcium: 9.1 mg/dL (ref 8.7–10.3)
Chloride: 100 mmol/L (ref 96–106)
Creatinine, Ser: 0.73 mg/dL (ref 0.57–1.00)
GFR calc Af Amer: 97 mL/min/{1.73_m2} (ref >59)
GFR calc non Af Amer: 84 mL/min/{1.73_m2} (ref >59)
Globulin, Total: 2.7 g/dL (ref 1.5–4.5)
Glucose: 98 mg/dL (ref 65–99)
Potassium: 4.3 mmol/L (ref 3.5–5.2)
Sodium: 138 mmol/L (ref 134–144)
Total Protein: 6.8 g/dL (ref 6.0–8.5)

## 2020-12-24 LAB — PHOSPHORUS: Phosphorus: 3.8 mg/dL (ref 3.0–4.3)

## 2020-12-24 LAB — VITAMIN D 25 HYDROXY (VIT D DEFICIENCY, FRACTURES): Vit D, 25-Hydroxy: 23.9 ng/mL — ABNORMAL LOW (ref 30.0–100.0)

## 2020-12-24 LAB — MAGNESIUM: Magnesium: 1.9 mg/dL (ref 1.6–2.3)

## 2020-12-24 LAB — TSH: TSH: 1.85 u[IU]/mL (ref 0.450–4.500)

## 2020-12-24 MED ORDER — VITAMIN D (ERGOCALCIFEROL) 1.25 MG (50000 UNIT) PO CAPS: 50000.0000 [IU] | ORAL_CAPSULE | ORAL | 0 refills | Status: DC

## 2020-12-24 NOTE — Assessment & Plan Note (Signed)
Will start trintellix. Call with any concerns. Continue to monitor. Recheck 1 month.

## 2020-12-24 NOTE — Assessment & Plan Note (Signed)
Rechecking labs. Await results. Treat as needed.  

## 2020-12-27 ENCOUNTER — Ambulatory Visit: Payer: Self-pay | Admitting: Family Medicine

## 2020-12-29 NOTE — Chronic Care Management (AMB) (Signed)
  Care Management   Note  12/29/2020 Name: DENNETTE FAULCONER MRN: [id number] DOB: 1950-12-17  VAISHALI BAISE is a 70 y.o. year old female who is a primary care patient of Dorcas Carrow, DO and is actively engaged with the care management team. I reached out to [name] by phone today to assist with re-scheduling a follow up visit with the RN Case Manager  Follow up plan: Telephone appointment with care management team member scheduled for:02/09/2021  Penne Lash, RMA Care Guide, Embedded Care Coordination [facility]  [place], [place] [address] Direct Dial: [phone] [email or web address]

## 2020-12-29 NOTE — Telephone Encounter (Signed)
Pt has been rescheduled. 

## 2021-01-06 ENCOUNTER — Ambulatory Visit: Payer: 59 | Admitting: Family Medicine

## 2021-01-07 ENCOUNTER — Telehealth (INDEPENDENT_AMBULATORY_CARE_PROVIDER_SITE_OTHER): Payer: 59 | Admitting: Nurse Practitioner

## 2021-01-07 ENCOUNTER — Encounter: Payer: Self-pay | Admitting: Nurse Practitioner

## 2021-01-07 DIAGNOSIS — R059 Cough, unspecified: Secondary | ICD-10-CM

## 2021-01-07 DIAGNOSIS — I700 Atherosclerosis of aorta: Secondary | ICD-10-CM

## 2021-01-07 DIAGNOSIS — J432 Centrilobular emphysema: Secondary | ICD-10-CM | POA: Diagnosis not present

## 2021-01-07 MED ORDER — ALBUTEROL SULFATE HFA 108 (90 BASE) MCG/ACT IN AERS: 2.0000 | INHALATION_SPRAY | Freq: Four times a day (QID) | RESPIRATORY_TRACT | 3 refills | Status: DC | PRN

## 2021-01-07 MED ORDER — PREDNISONE 20 MG PO TABS: 40.0000 mg | ORAL_TABLET | Freq: Every day | ORAL | 0 refills | Status: AC

## 2021-01-07 MED ORDER — AMOXICILLIN-POT CLAVULANATE 875-125 MG PO TABS: 1.0000 | ORAL_TABLET | Freq: Two times a day (BID) | ORAL | 0 refills | Status: AC

## 2021-01-07 NOTE — Assessment & Plan Note (Signed)
Suspect COPD exacerbation.  Will obtain Covid testing at office to further assess, recommend self quarantine until results return.  Scripts sent for Augmentin, Prednisone burst, and Albuterol inhaler.  Recommend she continue her Tussin DM at home as needed + ensure plenty of rest and hydration.  Recommend complete cessation of smoking.  Plan for return to office in 8 weeks, sooner if worsening symptoms.

## 2021-01-07 NOTE — Assessment & Plan Note (Signed)
Chronic, ongoing, noted on lung CT screening.  Long time smoker, since age 70.  Would benefit from spirometry at next visit to further assess lung function after exacerbation treated.  May need a maintenance inhaler.  At this time script for Albuterol inhaler sent and recommend complete cessation of smoking.  Return in 8 weeks.

## 2021-01-07 NOTE — Patient Instructions (Signed)
Chronic Obstructive Pulmonary Disease  Chronic obstructive pulmonary disease (COPD) is a long-term (chronic) lung problem. When you have COPD, it is hard for air to get in and out of your lungs. Usually the condition gets worse over time, and your lungs will never return to normal. There are things you can do to keep yourself as healthy as possible. What are the causes?  Smoking. This is the most common cause.  Certain genes passed from parent to child (inherited). What increases the risk?  Being exposed to secondhand smoke from cigarettes, pipes, or cigars.  Being exposed to chemicals and other irritants, such as fumes and dust in the work environment.  Having chronic lung conditions or infections. What are the signs or symptoms?  Shortness of breath, especially during physical activity.  A long-term cough with a large amount of thick mucus. Sometimes, the cough may not have any mucus (dry cough).  Wheezing.  Breathing quickly.  Skin that looks gray or blue, especially in the fingers, toes, or lips.  Feeling tired (fatigue).  Weight loss.  Chest tightness.  Having infections often.  Episodes when breathing symptoms become much worse (exacerbations). At the later stages of this disease, you may have swelling in the ankles, feet, or legs. How is this treated?  Taking medicines.  Quitting smoking, if you smoke.  Rehabilitation. This includes steps to make your body work better. It may involve a team of specialists.  Doing exercises.  Making changes to your diet.  Using oxygen.  Lung surgery.  Lung transplant.  Comfort measures (palliative care). Follow these instructions at home: Medicines  Take over-the-counter and prescription medicines only as told by your doctor.  Talk to your doctor before taking any cough or allergy medicines. You may need to avoid medicines that cause your lungs to be dry. Lifestyle  If you smoke, stop smoking. Smoking makes the  problem worse.  Do not smoke or use any products that contain nicotine or tobacco. If you need help quitting, ask your doctor.  Avoid being around things that make your breathing worse. This may include smoke, chemicals, and fumes.  Stay active, but remember to rest as well.  Learn and use tips on how to manage stress and control your breathing.  Make sure you get enough sleep. Most adults need at least 7 hours of sleep every night.  Eat healthy foods. Eat smaller meals more often. Rest before meals. Controlled breathing Learn and use tips on how to control your breathing as told by your doctor. Try:  Breathing in (inhaling) through your nose for 1 second. Then, pucker your lips and breath out (exhale) through your lips for 2 seconds.  Putting one hand on your belly (abdomen). Breathe in slowly through your nose for 1 second. Your hand on your belly should move out. Pucker your lips and breathe out slowly through your lips. Your hand on your belly should move in as you breathe out.   Controlled coughing Learn and use controlled coughing to clear mucus from your lungs. Follow these steps: 1. Lean your head a little forward. 2. Breathe in deeply. 3. Try to hold your breath for 3 seconds. 4. Keep your mouth slightly open while coughing 2 times. 5. Spit any mucus out into a tissue. 6. Rest and do the steps again 1 or 2 times as needed. General instructions  Make sure you get all the shots (vaccines) that your doctor recommends. Ask your doctor about a flu shot and a pneumonia shot.    Use oxygen therapy and pulmonary rehabilitation if told by your doctor. If you need home oxygen therapy, ask your doctor if you should buy a tool to measure your oxygen level (oximeter).  Make a COPD action plan with your doctor. This helps you to know what to do if you feel worse than usual.  Manage any other conditions you have as told by your doctor.  Avoid going outside when it is very hot, cold, or  humid.  Avoid people who have a sickness you can catch (contagious).  Keep all follow-up visits. Contact a doctor if:  You cough up more mucus than usual.  There is a change in the color or thickness of the mucus.  It is harder to breathe than usual.  Your breathing is faster than usual.  You have trouble sleeping.  You need to use your medicines more often than usual.  You have trouble doing your normal activities such as getting dressed or walking around the house. Get help right away if:  You have shortness of breath while resting.  You have shortness of breath that stops you from: ? Being able to talk. ? Doing normal activities.  Your chest hurts for longer than 5 minutes.  Your skin color is more blue than usual.  Your pulse oximeter shows that you have low oxygen for longer than 5 minutes.  You have a fever.  You feel too tired to breathe normally. These symptoms may represent a serious problem that is an emergency. Do not wait to see if the symptoms will go away. Get medical help right away. Call your local emergency services (911 in the [place] Do not drive yourself to the hospital. Summary  Chronic obstructive pulmonary disease (COPD) is a long-term lung problem.  The way your lungs work will never return to normal. Usually the condition gets worse over time. There are things you can do to keep yourself as healthy as possible.  Take over-the-counter and prescription medicines only as told by your doctor.  If you smoke, stop. Smoking makes the problem worse. This information is not intended to replace advice given to you by your health care provider. Make sure you discuss any questions you have with your health care provider. Document Revised: 09/28/2020 Document Reviewed: 09/28/2020 Elsevier Patient Education  2021 Elsevier Inc.   

## 2021-01-07 NOTE — Progress Notes (Signed)
There were no vitals taken for this visit.   Subjective:    Patient ID: Vanessa Oneal, female    DOB: 1951-04-07, 70 y.o.   MRN: [id number]  HPI: Vanessa Oneal is a 70 y.o. female  Chief Complaint  Patient presents with  . Cough    For past 4 day,  a friend gave her an inhaler that seem to be helping.    . This visit was completed via telephone due to the restrictions of the COVID-19 pandemic. All issues as above were discussed and addressed but no physical exam was performed. If it was felt that the patient should be evaluated in the office, they were directed there. The patient verbally consented to this visit. Patient was unable to complete an audio/visual visit due to Technical difficulties, Lack of internet. Due to the catastrophic nature of the COVID-19 pandemic, this visit was done through audio contact only. . Location of the patient: home . Location of the provider: work . Those involved with this call:  . Provider: Aura Dials, DNP . CMA: Tristan Schroeder, CMA . Front Desk/Registration: Harriet Pho  . Time spent on call: 21 minutes on the phone discussing health concerns. 15 minutes total spent in review of patient's record and preparation of their chart.  . I verified patient identity using two factors (patient name and date of birth). Patient consents verbally to being seen via telemedicine visit today.   UPPER RESPIRATORY TRACT INFECTION Started with cough 4 days ago and a friend gave her an Albuterol inhaler and this helped her cough stuff up.  Reports this helped a lot.  Is a current smoker, smokes less then 1 PPD.  Has smoked for 50 years and goes annual lung screenings.  Last one was in August 2021 noted emphysema and aortic atherosclerosis.  Has had stuffy and runny nose x 2 weeks.  Had a little sore throat yesterday.  Covid vaccinated x 2.  Denies loss of taste or smell. Fever: no Cough: yes Shortness of breath: occasional -- at baseline Wheezing:  none Chest pain: no Chest tightness: no Chest congestion: yes Nasal congestion: yes Runny nose: yes Post nasal drip: yes Sneezing: no Sore throat: yes Swollen glands: no Sinus pressure: yes Headache: yes Face pain: no Toothache: no Ear pain: none Ear pressure: none Eyes red/itching:no Eye drainage/crusting: no  Vomiting: no Rash: no Fatigue: yes Sick contacts: no Strep contacts: no  Context: stable Recurrent sinusitis: no Relief with OTC cold/cough medications: yes Treatments attempted: Albuterol inhaler from a friend, a little Tussin DM  Relevant past medical, surgical, family and social history reviewed and updated as indicated. Interim medical history since our last visit reviewed. Allergies and medications reviewed and updated.  Review of Systems  Constitutional: Positive for fatigue. Negative for activity change, appetite change, chills and fever.  HENT: Positive for congestion, postnasal drip, rhinorrhea, sinus pressure and sore throat. Negative for ear discharge, ear pain, facial swelling, sinus pain, sneezing and voice change.   Eyes: Negative for pain and visual disturbance.  Respiratory: Positive for cough and shortness of breath. Negative for chest tightness and wheezing.   Cardiovascular: Negative for chest pain, palpitations and leg swelling.  Gastrointestinal: Negative for abdominal distention, abdominal pain, constipation, diarrhea, nausea and vomiting.  Endocrine: Negative.   Musculoskeletal: Negative for myalgias.  Neurological: Positive for headaches. Negative for dizziness and numbness.  Psychiatric/Behavioral: Negative.     Per HPI unless specifically indicated above     Objective:  There were no vitals taken for this visit.  Wt Readings from Last 3 Encounters:  12/23/20 86 lb 12.8 oz (39.4 kg)  08/25/20 84 lb (38.1 kg)  07/15/20 80 lb (36.3 kg)    Physical Exam   Unable to assess due to telephone visit only.  No SOB noted with  talking.  Results for orders placed or performed in visit on 12/23/20  Phosphorus  Result Value Ref Range   Phosphorus 3.8 3.0 - 4.3 mg/dL  Magnesium  Result Value Ref Range   Magnesium 1.9 1.6 - 2.3 mg/dL  TSH  Result Value Ref Range   TSH 1.850 0.450 - 4.500 uIU/mL  Comprehensive metabolic panel  Result Value Ref Range   Glucose 98 65 - 99 mg/dL   BUN 12 8 - 27 mg/dL   Creatinine, Ser 4.40 0.57 - 1.00 mg/dL   GFR calc non Af Amer 84 >59 mL/min/1.73   GFR calc Af Amer 97 >59 mL/min/1.73   BUN/Creatinine Ratio 16 12 - 28   Sodium 138 134 - 144 mmol/L   Potassium 4.3 3.5 - 5.2 mmol/L   Chloride 100 96 - 106 mmol/L   CO2 23 20 - 29 mmol/L   Calcium 9.1 8.7 - 10.3 mg/dL   Total Protein 6.8 6.0 - 8.5 g/dL   Albumin 4.1 3.8 - 4.8 g/dL   Globulin, Total 2.7 1.5 - 4.5 g/dL   Albumin/Globulin Ratio 1.5 1.2 - 2.2   Bilirubin Total 0.3 0.0 - 1.2 mg/dL   Alkaline Phosphatase 75 44 - 121 IU/L   AST 21 0 - 40 IU/L   ALT 10 0 - 32 IU/L  VITAMIN D 25 Hydroxy (Vit-D Deficiency, Fractures)  Result Value Ref Range   Vit D, 25-Hydroxy 23.9 (L) 30.0 - 100.0 ng/mL      Assessment & Plan:   Problem List Items Addressed This Visit      Cardiovascular and Mediastinum   Aortic atherosclerosis (HCC)    Noted on lung CT screening, recommend complete cessation of smoking.  Monitor lipid panel and may benefit from statin in future to assist with prevention.        Respiratory   Centrilobular emphysema (HCC) - Primary    Chronic, ongoing, noted on lung CT screening.  Long time smoker, since age 19.  Would benefit from spirometry at next visit to further assess lung function after exacerbation treated.  May need a maintenance inhaler.  At this time script for Albuterol inhaler sent and recommend complete cessation of smoking.  Return in 8 weeks.      Relevant Medications   predniSONE (DELTASONE) 20 MG tablet   albuterol (VENTOLIN HFA) 108 (90 Base) MCG/ACT inhaler     Other   Cough     Suspect COPD exacerbation.  Will obtain Covid testing at office to further assess, recommend self quarantine until results return.  Scripts sent for Augmentin, Prednisone burst, and Albuterol inhaler.  Recommend she continue her Tussin DM at home as needed + ensure plenty of rest and hydration.  Recommend complete cessation of smoking.  Plan for return to office in 8 weeks, sooner if worsening symptoms.      Relevant Orders   Novel Coronavirus, NAA ([facility])      I discussed the assessment and treatment plan with the patient. The patient was provided an opportunity to ask questions and all were answered. The patient agreed with the plan and demonstrated an understanding of the instructions.   The patient was advised to  call back or seek an in-person evaluation if the symptoms worsen or if the condition fails to improve as anticipated.   I provided 21+ minutes of time during this encounter.  Follow up plan: Return in about 8 weeks (around 03/04/2021) for COPD -- spirometry in office.

## 2021-01-07 NOTE — Assessment & Plan Note (Signed)
Noted on lung CT screening, recommend complete cessation of smoking.  Monitor lipid panel and may benefit from statin in future to assist with prevention.

## 2021-01-10 ENCOUNTER — Telehealth: Payer: Self-pay

## 2021-01-10 NOTE — Telephone Encounter (Signed)
-----   Message from Jolene T Cannady, NP sent at 01/07/2021  3:32 PM EST ----- Needs Covid testing scheduled + needs 8 weeks follow-up with Dr. Johnson to do spirometry in office 

## 2021-01-10 NOTE — Telephone Encounter (Signed)
LVm to make these apts.

## 2021-01-10 NOTE — Telephone Encounter (Signed)
lvm to make apts

## 2021-01-10 NOTE — Telephone Encounter (Signed)
-----   Message from Marjie Skiff, NP sent at 01/07/2021  3:32 PM EST ----- Needs Covid testing scheduled + needs 8 weeks follow-up with Dr. Laural Benes to do spirometry in office

## 2021-01-27 ENCOUNTER — Ambulatory Visit: Payer: Self-pay

## 2021-01-28 ENCOUNTER — Ambulatory Visit (INDEPENDENT_AMBULATORY_CARE_PROVIDER_SITE_OTHER): Payer: 59

## 2021-01-28 VITALS — Ht 61.0 in | Wt 87.0 lb

## 2021-01-28 DIAGNOSIS — Z0000 Encounter for general adult medical examination without abnormal findings: Secondary | ICD-10-CM | POA: Diagnosis not present

## 2021-01-28 NOTE — Progress Notes (Signed)
I connected with Vanessa Oneal today by telephone and verified that I am speaking with the correct person using two identifiers. Location patient: home Location provider: work Persons participating in the virtual visit: Safia Panzer, Elisha Ponder LPN.   I discussed the limitations, risks, security and privacy concerns of performing an evaluation and management service by telephone and the availability of in person appointments. I also discussed with the patient that there may be a patient responsible charge related to this service. The patient expressed understanding and verbally consented to this telephonic visit.    Interactive audio and video telecommunications were attempted between this provider and patient, however failed, due to patient having technical difficulties OR patient did not have access to video capability.  We continued and completed visit with audio only.     Vital signs may be patient reported or missing.  Subjective:   Vanessa Oneal is a 70 y.o. female who presents for Medicare Annual (Subsequent) preventive examination.  Review of Systems     Cardiac Risk Factors include: advanced age (>68men, >73 women);sedentary lifestyle     Objective:    Today's Vitals   01/28/21 1257  Weight: 87 lb (39.5 kg)  Height: 5\' 1"  (1.549 m)   Body mass index is 16.44 kg/m.  Advanced Directives 01/28/2021 01/26/2020 05/06/2019 02/04/2018 02/09/2016  Does Patient Have a Medical Advance Directive? No No No No No  Would patient like information on creating a medical advance directive? - Yes (MAU/Ambulatory/Procedural Areas - Information given) Yes (MAU/Ambulatory/Procedural Areas - Information given) Yes (MAU/Ambulatory/Procedural Areas - Information given) -    Current Medications (verified) Outpatient Encounter Medications as of 01/28/2021  Medication Sig   albuterol (VENTOLIN HFA) 108 (90 Base) MCG/ACT inhaler Inhale 2 puffs into the lungs every 6 (six) hours as needed for  wheezing or shortness of breath.   diclofenac Sodium (VOLTAREN) 1 % GEL Apply 4 g topically 4 (four) times daily.   ondansetron (ZOFRAN ODT) 4 MG disintegrating tablet Take 1 tablet (4 mg total) by mouth every 8 (eight) hours as needed for nausea or vomiting.   SUBOXONE 8-2 MG FILM Take 1 Film by mouth 2 (two) times daily.    Vitamin D, Ergocalciferol, (DRISDOL) 1.25 MG (50000 UNIT) CAPS capsule Take 1 capsule (50,000 Units total) by mouth every 7 (seven) days.   vortioxetine HBr (TRINTELLIX) 5 MG TABS tablet Take 1 tablet (5 mg total) by mouth daily.   No facility-administered encounter medications on file as of 01/28/2021.    Allergies (verified) Gabapentin   History: Past Medical History:  Diagnosis Date   Arthritis    RA   Depression    Fibromyalgia    GERD (gastroesophageal reflux disease)    H/O drug dependence (HCC)    opiods   Wears dentures    full upper   Past Surgical History:  Procedure Laterality Date   BREAST SURGERY     Silicone implants, then removal   CATARACT EXTRACTION W/PHACO Left 05/06/2019   Procedure: CATARACT EXTRACTION PHACO AND INTRAOCULAR LENS PLACEMENT (IOC) LEFT;  Surgeon: 07/06/2019, MD;  Location: [facility];  Service: Ophthalmology;  Laterality: Left;   TUBAL LIGATION     Family History  Problem Relation Age of Onset   Suicidality Father    COPD Sister    Social History   Socioeconomic History   Marital status: Widowed    Spouse name: Not on file   Number of children: Not on file   Years of education: Not  on file   Highest education level: Not on file  Occupational History   Occupation: retired  Tobacco Use   Smoking status: Light Tobacco Smoker    Packs/day: 0.25    Years: 56.00    Pack years: 14.00    Types: Cigarettes    Last attempt to quit: 11/04/2019    Years since quitting: 1.2   Smokeless tobacco: Never Used   Tobacco comment: 1 pack with last a couple days   Vaping Use   Vaping  Use: Former  Substance and Sexual Activity   Alcohol use: Not Currently   Drug use: No   Sexual activity: Not Currently  Other Topics Concern   Not on file  Social History Narrative   ** Merged History Encounter **       Social Determinants of Health   Financial Resource Strain: Low Risk    Difficulty of Paying Living Expenses: Not hard at all  Food Insecurity: No Food Insecurity   Worried About Programme researcher, broadcasting/film/video in the Last Year: Never true   Ran Out of Food in the Last Year: Never true  Transportation Needs: No Transportation Needs   Lack of Transportation (Medical): No   Lack of Transportation (Non-Medical): No  Physical Activity: Inactive   Days of Exercise per Week: 0 days   Minutes of Exercise per Session: 0 min  Stress: Stress Concern Present   Feeling of Stress : To some extent  Social Connections: Socially Isolated   Frequency of Communication with Friends and Family: More than three times a week   Frequency of Social Gatherings with Friends and Family: More than three times a week   Attends Religious Services: Never   Database administrator or Organizations: No   Attends Banker Meetings: Never   Marital Status: Widowed    Tobacco Counseling Ready to quit: Not Answered Counseling given: Not Answered Comment: 1 pack with last a couple days    Clinical Intake:  Pre-visit preparation completed: Yes  Pain : No/denies pain     Nutritional Status: BMI <19  Underweight Nutritional Risks: None Diabetes: No  How often do you need to have someone help you when you read instructions, pamphlets, or other written materials from your doctor or pharmacy?: 1 - Never What is the last grade level you completed in school?: 9th grade  Diabetic? no  Interpreter Needed?: No  Information entered by :: NAllen LPN   Activities of Daily Living In your present state of health, do you have any difficulty performing the following  activities: 01/28/2021  Hearing? N  Vision? Y  Difficulty concentrating or making decisions? Y  Walking or climbing stairs? N  Dressing or bathing? N  Doing errands, shopping? N  Preparing Food and eating ? N  Using the Toilet? N  In the past six months, have you accidently leaked urine? N  Do you have problems with loss of bowel control? N  Managing your Medications? N  Managing your Finances? N  Housekeeping or managing your Housekeeping? N  Some recent data might be hidden    Patient Care Team: Dorcas Carrow, DO as PCP - General (Family Medicine) Pcp, No Marlowe Sax, RN as Case Manager (General Practice) Gustavus Bryant, LCSW as Social Worker (Licensed Clinical Social Worker)  Indicate any recent [employer] you may have received from other than [facility] providers in the past year (date may be approximate).     Assessment:   This is  a routine wellness examination for Vanessa Oneal.  Hearing/Vision screen  Hearing Screening   125Hz  250Hz  500Hz  1000Hz  2000Hz  3000Hz  4000Hz  6000Hz  8000Hz   Right ear:           Left ear:           Vision Screening Comments: No regular eye exams, [facility]  Dietary issues and exercise activities discussed: Current Exercise Habits: The patient does not participate in regular exercise at present  Goals      DIET - INCREASE WATER INTAKE      Recommend drinking at least 6-8 glasses of water a day       Patient Stated      01/28/2021, wants to gain weight      RNCM: Enhance My Mental Skills      Follow Up Date 11-05-2020   - stay in touch with my family and friends - take a walk daily and think about what I am seeing    Why is this important?   As we age, or sometimes because we have an illness, it feels like our memory and ability to figure things out is not very good.  There are things you can do to keep your memory and your thinking as strong as possible.    Notes: Patient notes a decline in memory. Facilitating thought  process will be good for recall and enhancing memory.      RNCM: Learn More About My Health      Follow Up Date 11-05-2020   - tell my story and reason for my visit - make a list of questions - ask questions - repeat what I heard to make sure I understand - bring a list of my medicines to the visit - speak up when I don't understand    Why is this important?   The best way to learn about your health and care is by talking to the doctor and nurse.  They will answer your questions and give you information in the way that you like best.    Notes: Encouragement needed for the patient to write down questions and ask questions at her visits. Reminder to be active in her health essential.       RNCM: Make and Keep All Appointments      Follow Up Date 11/05/2020   - ask family or friend for a ride - call to cancel if needed - keep a calendar with prescription refill dates - keep a calendar with appointment dates - use public transportation    Why is this important?   Part of staying healthy is seeing the doctor for follow-up care.  If you forget your appointments, there are some things you can do to stay on track.    Notes: The patient was a no-show to her appointment with Dr. Servando Snare on 08-12-2020.  The patient states that she thought she had COVID and that is why she did not go. She did not rescheduled. She did get a letter from Dr. Servando Snare recently that said she did not need further follow up.       RNCM: Manage My Emotions      Follow Up Date 11-05-2020   - call and visit an old friend - learn and use visualization or guided imagery - practice relaxation or meditation daily - talk about feelings with a friend, family or spiritual advisor - practice positive thinking and self-talk    Why is this important?   When you are stressed, down  or upset, your body reacts too.  For example, your blood pressure may get higher; you may have a headache or stomachache.  When your emotions get the  best of you, your body's ability to fight off cold and flu gets weak.  These steps will help you manage your emotions.     Notes: The patient is good about expressing her emotions. Feels she does a good job at this.       RNCM: Matintain My Quality of Life      Follow Up Date 11-05-2020   - complete a living will - discuss my treatment options with the doctor or nurse - do one enjoyable thing every day - share memories using a picture album or scrapbook with my loved ones - spend time outdoors at least 3 times a week - strengthen or fix relationships with loved ones    Why is this important?   Having a long-term illness can be scary.  It can also be stressful for you and your caregiver.  These steps may help.    Notes: Has good family support system      RNCM: Protect My Health      Follow Up Date 11-05-2020   - schedule appointment for flu shot - schedule appointment for vaccines needed due to my age or health - schedule recommended health tests (blood work, mammogram, colonoscopy, pap test) - schedule and keep appointment for annual check-up    Why is this important?   Screening tests can find diseases early when they are easier to treat.  Your doctor or nurse will talk with you about which tests are important for you.  Getting shots for common diseases like the flu and shingles will help prevent them.     Notes: The patient wants to get the flu shot. Has had both COVID vaccinations. Will consider the booster if needed.       RNCM: Pt-"I want to take better care of myself" (pt-stated)      CARE PLAN ENTRY (see longtitudinal plan of care for additional care plan information)  Current Barriers:   Chronic Disease Management support, education, and care coordination needs related to CAD, Anxiety, and Pulmonary Disease  Clinical Goal(s) related to CAD, Anxiety, and Pulmonary Disease:  Over the next 120 days, patient will:   Work with the care management team to address  educational, disease management, and care coordination needs   Begin or continue self health monitoring activities as directed today  adhere to a heart healthy diet and utilize effective coping mechanisms for anxiety and stress  Call provider office for new or worsened signs and symptoms Shortness of breath and New or worsened symptom related to CAD and Anxiety  Call care management team with questions or concerns  Verbalize basic understanding of patient centered plan of care established today  Interventions related to CAD, Anxiety, and Pulmonary Disease:   Evaluation of current treatment plans and patient's adherence to plan as established by provider.  The patient states that she wants to take care of herself and she wants to follow the instructions of her doctors. She is very forgetful and needs redirection at times. Education and support.   Assessed patient understanding of disease states.  The patient has had issues with depression and anxiety recently as her only sibling passed away unexpectedly on 05/23/2023.  The patient states she has been having a hard time but is thankful for her niece being there with her. 09-03-2020: The patient recently  thought she had COVID19 but did not. She was tested and when they tested her it made her nose and face swell up. The patient states that she is feeling much better now. Saw dr. Laural Benes on 08-13-2020 and 08-25-2020  Assessed patient's education and care coordination needs.  Continued education and support on chronic disease processes and needs of the patient. The patient is thankful for the CCM team working with her.   Provided disease specific education to patient.  Education on eating healthy and monitoring for weight loss. The patient states that she had just gotten up from a nap. She feels she is doing a lot better. She also says things are a lot better since she her nieces boyfriend moved out.   Collaborated with appropriate clinical care team  members regarding patient needs.  Will let the LCSW know that the patients sister passed and the hard time she has been having. 07-28-2020: The patient is working with the LCSW for expressed needs and ongoing support and education.   Evaluation of upcoming appointments. The patient does not have any upcoming appointments. Saw Dr. Laural Benes on 9-10- and 08-25-2020.  Will continue to monitor.   Patient Self Care Activities related to CAD, Anxiety, and Pulmonary Disease:   Patient is unable to independently self-manage chronic health conditions  Please see past updates related to this goal by clicking on the "Past Updates" button in the selected goal        RNCM: Pt-"They called yesterday and changed my appointment" (pt-stated)      CARE PLAN ENTRY (see longtitudinal plan of care for additional care plan information)  Current Barriers:   Knowledge Deficits related to Hepatitis C and the importance of continued care and treatment for Hepatitis C  Financial Constraints.   Transportation barriers  Non-adherence to scheduled provider appointments  Nurse Case Manager Clinical Goal(s):   Over the next 120 days, patient will verbalize understanding of plan for Hepatitis C and management of Hepatitis C  Over the next 120 days, patient will work with RNCM, pcp, and CCM team to address needs related to Hepatitis C  Over the next 120 days, patient will demonstrate a decrease in Hepatitis C exacerbations as evidenced by attending GI appointments and following plan of care for Hepatitis C  Over the next 120 days, patient will attend all scheduled medical appointments: the patients appointment with Dr. Midge Minium was rescheduled from 03/10/2020 to 03/11/2020 at 130pm.   Over the next 120 days, patient will verbalize basic understanding of Hepatitis C disease process and self health management plan as evidenced by compliance with prescribed treatment plan  Over the next 120 days, patient will work with  CM clinical social worker to help with anxiety and depression  Over the next 120 days, patient will work with care guides  to help with food resources such as soup [employer]   Over the next 120 days, the patient will demonstrate ongoing self health care management ability as evidenced by compliance with medication regimen and plan of care for management of Hepatitis C  Interventions:   Evaluation of current treatment plan related to Hepatitis C and patient's adherence to plan as established by provider.  Provided disease specific education to patient.  Education on follow up with GI MD for recommendations on managing her Hepatitis C and follow up with pcp for chronic disease management.  09-03-2020:  The patient was unable to go to her appointment on 08-12-2020 because she thought she was COVID positive.  She was not but she did not go. The patient states that she got a letter from Dr. Servando Snare office stating that she did not need further treatment. Encouraged the patient to follow up regularly with the GI provider in managing her Hepatitis C.   Advised patient to call for changes in appointments or transportation needs   Provided education to patient re: Hepatitis C, taking medications with her to her appointment and writing down questions to ask the provider  Collaborated with Dr. Servando Snare office  regarding patients appointment on 03/11/2020. Directions given to come to [address], [address], [place], [place] [address].  The patient verbalized her friend is taking her to the appointment tomorrow. Education on the importance of going to the appointment as scheduled. The patient confirmed she is going to her appointment.  Her niece had a talk with her and told her how important it was for her to go also. 06-09-2020: the patient went to her appointment. She says they want to treat her for her liver. She said they were supposed to send her paperwork.  She has not heard from them. Recommended she call their  office and talk to the office about follow up. 07-28-2020: The patient states she has not heard from the GI provider. Will see about contacting  Dr. Annabell Sabal office to get the patient a follow up appointment. Call made to Dr. Annabell Sabal office, the [facility] was unable to talk to staff but a detailed message left asking for the office to call the patient to get a follow up appointment in place to come back and see Dr. Servando Snare.  RNCM information also left on the voice mail for additional assistance in helping the patient get needed appointment.   Discussed plans with patient for ongoing care management follow up and provided patient with direct contact information for care management team  Reviewed scheduled/upcoming provider appointments including: The patient missed her appointment with Dr. Servando Snare on 08-12-2020. She states she received a letter and does not have to go back to see him. Encouraged the patient to see GI MD for management of Hepatitis C.  Social Work referral for patient need for coping mechanisms. Patient tearful during the call but thankful for the support of the CCM team.   Patient Self Care Activities:   Patient verbalizes understanding of plan to manage Hepatitis C by attending all appointments and following the plan of care established by the provider  Self administers medications as prescribed  Attends all scheduled provider appointments  Calls provider office for new concerns or questions  Unable to independently manage Hepatitis C as evidence of multiple missed appointments   Does not attend all scheduled provider appointments  Please see past updates related to this goal by clicking on the "Past Updates" button in the selected goal        SW: Pt: "I want to work on my mental health." (pt-stated)      Current Barriers:   Chronic Mental Health needs related to Grief, Generalized Anxiety Disorder and added stressors  Limited social support  Transportation  Mental Health Concerns    Social Isolation  Suicidal Ideation/Homicidal Ideation: No  Clinical Social Work Goal(s):   Over the next 120 days, patient will work with SW to address concerns related to gaining additional support/resource connection and community resource education in order to maintain health and mental health appropriately   Over the next 120 days, patient will work with SW bi-monthly by telephone or in person to reduce or  manage symptoms related to anxiety and grief  Over the next 120 days, patient will demonstrate improved health management independence as evidenced by implementing healthy self-care and positive support/resources into her daily routine to cope with stressors and improve overall health and well-being  Interventions:  Patient interviewed and appropriate assessments performed: brief mental health assessment  Provided mental health counseling with regard to grief and anxiety management. Patient lost sister this month. Coping skill education provided.  Discussed plans with patient for ongoing care management follow up and provided patient with direct contact information for care management team  Advised patient to implement self-care instructions that we dicussed during session today  Assisted patient/caregiver with obtaining information about health plan benefits  Provided education and assistance to client regarding Advanced Directives.  Provided education to patient/caregiver regarding level of care options.  Provided education to patient/caregiver about Hospice and/or Palliative Care services  Patient reports that her family have been very supportive of her since the loss of her sister. Positive reinforcement provided for asking for help and socializing with people she feels safe with when needed.   Referred patient to [facility] (mental health provider) for individual grief counseling but patient declined wanting any mental health support at this time.   Patient Self  Care Activities:   Attends all scheduled provider appointments  Calls provider office for new concerns or questions  Ability for insight  Strong family or social support  Patient Coping Strengths:   Supportive Relationships  Family  Spirituality  Hopefulness  Self Advocate  Able to Communicate Effectively  Patient Self Care Deficits:   Lacks social connections  Recent passing of sister   Initial goal documentation       Depression Screen PHQ 2/9 Scores 01/28/2021 01/07/2021 03/10/2020 01/26/2020 02/04/2018 12/11/2017 11/22/2017  PHQ - 2 Score PHQ- 9 Score - Fall Risk Fall Risk  01/28/2021 01/07/2021 01/26/2020 02/04/2018 11/22/2017  Falls in the past year? 0 1 0 No No  Number falls in past yr: - - 0 - -  Injury with Fall? - 0 0 - -  Risk for fall due to : Medication side effect - - - -  Follow up Falls evaluation completed;Education provided;Falls prevention discussed - - - -    FALL RISK PREVENTION PERTAINING TO THE HOME:  Any stairs in or around the home? Yes  If so, are there any without handrails? No  Home free of loose throw rugs in walkways, pet beds, electrical cords, etc? Yes  Adequate lighting in your home to reduce risk of falls? Yes   ASSISTIVE DEVICES UTILIZED TO PREVENT FALLS:  Life alert? No  Use of a cane, walker or w/c? No  Grab bars in the bathroom? Yes  Shower chair or bench in shower? Yes  Elevated toilet seat or a handicapped toilet? No   TIMED UP AND GO:  Was the test performed? No .  .   Cognitive Function:     6CIT Screen 01/28/2021 02/04/2018  What Year? 0 points 0 points  What month? 0 points 0 points  What time? 0 points 0 points  Count back from 20 0 points 0 points  Months in reverse 4 points 0 points  Repeat phrase 4 points 0 points  Total Score 8 0    Immunizations Immunization History  Administered Date(s) Administered   Influenza, High Dose Seasonal PF 11/19/2017   Influenza, Seasonal,  Injecte, Preservative Fre 10/19/2006   Influenza,inj,Quad PF,6+ Mos 09/18/2012, 08/06/2013, 09/03/2015   Moderna Sars-Covid-2 Vaccination 05/11/2020, 06/11/2020   Pneumococcal Conjugate-13 11/22/2017   Pneumococcal Polysaccharide-23 03/04/2020    TDAP status: Due, Education has been provided regarding the importance of this vaccine. Advised may receive this vaccine at local pharmacy or Health Dept. Aware to provide a copy of the vaccination record if obtained from local pharmacy or Health Dept. Verbalized acceptance and understanding.  Flu Vaccine status: Due, Education has been provided regarding the importance of this vaccine. Advised may receive this vaccine at local pharmacy or Health Dept. Aware to provide a copy of the vaccination record if obtained from local pharmacy or Health Dept. Verbalized acceptance and understanding.  Pneumococcal vaccine status: Up to date  Covid-19 vaccine status: Completed vaccines  Qualifies for Shingles Vaccine? Yes   Zostavax completed No   Shingrix Completed?: No.    Education has been provided regarding the importance of this vaccine. Patient has been advised to call insurance company to determine out of pocket expense if they have not yet received this vaccine. Advised may also receive vaccine at local pharmacy or Health Dept. Verbalized acceptance and understanding.  Screening Tests Health Maintenance  Topic Date Due   TETANUS/TDAP  Never done   COLONOSCOPY (Pts 45-52yrs Insurance coverage will need to be confirmed)  Never done   MAMMOGRAM  Never done   DEXA SCAN  Never done   COVID-19 Vaccine (3 - Booster for Moderna series) 12/12/2020   INFLUENZA VACCINE  03/03/2021 (Originally 07/04/2020)   Hepatitis C Screening  Completed   PNA vac Low Risk Adult  Completed    Health Maintenance  Health Maintenance Due  Topic Date Due   TETANUS/TDAP  Never done   COLONOSCOPY (Pts 45-10yrs Insurance coverage will need to be confirmed)  Never  done   MAMMOGRAM  Never done   DEXA SCAN  Never done   COVID-19 Vaccine (3 - Booster for Moderna series) 12/12/2020    Colorectal cancer screening: due  Mammogram status: due  Bone Density status: due  Lung Cancer Screening: (Low Dose CT Chest recommended if Age 32-80 years, 30 pack-year currently smoking OR have quit w/in 15years.) does not qualify.   Lung Cancer Screening Referral: no  Additional Screening:  Hepatitis C Screening: does qualify; Completed 09/03/2020  Vision Screening: Recommended annual ophthalmology exams for early detection of glaucoma and other disorders of the eye. Is the patient up to date with their annual eye exam?  No  Who is the provider or what is the name of the office in which the patient attends annual eye exams? [facility] If pt is not established with a provider, would they like to be referred to a provider to establish care? No .   Dental Screening: Recommended annual dental exams for proper oral hygiene  Community Resource Referral / Chronic Care Management: CRR required this visit?  No   CCM required this visit?  No      Plan:     I have personally reviewed and noted the following in the patients chart:    Medical and social history  Use of alcohol, tobacco or illicit drugs   Current medications and supplements  Functional ability and status  Nutritional status  Physical activity  Advanced directives  List of other physicians  Hospitalizations, surgeries, and ER visits in previous 12 months  Vitals  Screenings to include cognitive, depression, and falls  Referrals and appointments  In addition, I  have reviewed and discussed with patient certain preventive protocols, quality metrics, and best practice recommendations. A written personalized care plan for preventive services as well as general preventive health recommendations were provided to patient.     Barb Merino, LPN   1/61/0960   Nurse  Notes:

## 2021-01-28 NOTE — Patient Instructions (Signed)
Vanessa Oneal , Thank you for taking time to come for your Medicare Wellness Visit. I appreciate your ongoing commitment to your health goals. Please review the following plan we discussed and let me know if I can assist you in the future.   Screening recommendations/referrals: Colonoscopy: due Mammogram: due Bone Density: due Recommended yearly ophthalmology/optometry visit for glaucoma screening and checkup Recommended yearly dental visit for hygiene and checkup  Vaccinations: Influenza vaccine: due Pneumococcal vaccine: completed 03/04/2020 Tdap vaccine: due Shingles vaccine: discussed   Covid-19: 06/11/2020, 05/11/2020  Advanced directives: Advance directive discussed with you today.  Conditions/risks identified: smoking  Next appointment: Follow up in one year for your annual wellness visit    Preventive Care 65 Years and Older, Female Preventive care refers to lifestyle choices and visits with your health care provider that can promote health and wellness. What does preventive care include?  A yearly physical exam. This is also called an annual well check.  Dental exams once or twice a year.  Routine eye exams. Ask your health care provider how often you should have your eyes checked.  Personal lifestyle choices, including:  Daily care of your teeth and gums.  Regular physical activity.  Eating a healthy diet.  Avoiding tobacco and drug use.  Limiting alcohol use.  Practicing safe sex.  Taking low-dose aspirin every day.  Taking vitamin and mineral supplements as recommended by your health care provider. What happens during an annual well check? The services and screenings done by your health care provider during your annual well check will depend on your age, overall health, lifestyle risk factors, and family history of disease. Counseling  Your health care provider may ask you questions about your:  Alcohol use.  Tobacco use.  Drug use.  Emotional  well-being.  Home and relationship well-being.  Sexual activity.  Eating habits.  History of falls.  Memory and ability to understand (cognition).  Work and work Astronomer.  Reproductive health. Screening  You may have the following tests or measurements:  Height, weight, and BMI.  Blood pressure.  Lipid and cholesterol levels. These may be checked every 5 years, or more frequently if you are over 50 years old.  Skin check.  Lung cancer screening. You may have this screening every year starting at age 6 if you have a 30-pack-year history of smoking and currently smoke or have quit within the past 15 years.  Fecal occult blood test (FOBT) of the stool. You may have this test every year starting at age 45.  Flexible sigmoidoscopy or colonoscopy. You may have a sigmoidoscopy every 5 years or a colonoscopy every 10 years starting at age 53.  Hepatitis C blood test.  Hepatitis B blood test.  Sexually transmitted disease (STD) testing.  Diabetes screening. This is done by checking your blood sugar (glucose) after you have not eaten for a while (fasting). You may have this done every 1-3 years.  Bone density scan. This is done to screen for osteoporosis. You may have this done starting at age 74.  Mammogram. This may be done every 1-2 years. Talk to your health care provider about how often you should have regular mammograms. Talk with your health care provider about your test results, treatment options, and if necessary, the need for more tests. Vaccines  Your health care provider may recommend certain vaccines, such as:  Influenza vaccine. This is recommended every year.  Tetanus, diphtheria, and acellular pertussis (Tdap, Td) vaccine. You may need a Td booster every  10 years.  Zoster vaccine. You may need this after age 50.  Pneumococcal 13-valent conjugate (PCV13) vaccine. One dose is recommended after age 75.  Pneumococcal polysaccharide (PPSV23) vaccine. One  dose is recommended after age 72. Talk to your health care provider about which screenings and vaccines you need and how often you need them. This information is not intended to replace advice given to you by your health care provider. Make sure you discuss any questions you have with your health care provider. Document Released: 12/17/2015 Document Revised: 08/09/2016 Document Reviewed: 09/21/2015 Elsevier Interactive Patient Education  2017 [place] Prevention in the Home Falls can cause injuries. They can happen to people of all ages. There are many things you can do to make your home safe and to help prevent falls. What can I do on the outside of my home?  Regularly fix the edges of walkways and driveways and fix any cracks.  Remove anything that might make you trip as you walk through a door, such as a raised step or threshold.  Trim any bushes or trees on the path to your home.  Use bright outdoor lighting.  Clear any walking paths of anything that might make someone trip, such as rocks or tools.  Regularly check to see if handrails are loose or broken. Make sure that both sides of any steps have handrails.  Any raised decks and porches should have guardrails on the edges.  Have any leaves, snow, or ice cleared regularly.  Use sand or salt on walking paths during winter.  Clean up any spills in your garage right away. This includes oil or grease spills. What can I do in the bathroom?  Use night lights.  Install grab bars by the toilet and in the tub and shower. Do not use towel bars as grab bars.  Use non-skid mats or decals in the tub or shower.  If you need to sit down in the shower, use a plastic, non-slip stool.  Keep the floor dry. Clean up any water that spills on the floor as soon as it happens.  Remove soap buildup in the tub or shower regularly.  Attach bath mats securely with double-sided non-slip rug tape.  Do not have throw rugs and other  things on the floor that can make you trip. What can I do in the bedroom?  Use night lights.  Make sure that you have a light by your bed that is easy to reach.  Do not use any sheets or blankets that are too big for your bed. They should not hang down onto the floor.  Have a firm chair that has side arms. You can use this for support while you get dressed.  Do not have throw rugs and other things on the floor that can make you trip. What can I do in the kitchen?  Clean up any spills right away.  Avoid walking on wet floors.  Keep items that you use a lot in easy-to-reach places.  If you need to reach something above you, use a strong step stool that has a grab bar.  Keep electrical cords out of the way.  Do not use floor polish or wax that makes floors slippery. If you must use wax, use non-skid floor wax.  Do not have throw rugs and other things on the floor that can make you trip. What can I do with my stairs?  Do not leave any items on the stairs.  Make sure  that there are handrails on both sides of the stairs and use them. Fix handrails that are broken or loose. Make sure that handrails are as long as the stairways.  Check any carpeting to make sure that it is firmly attached to the stairs. Fix any carpet that is loose or worn.  Avoid having throw rugs at the top or bottom of the stairs. If you do have throw rugs, attach them to the floor with carpet tape.  Make sure that you have a light switch at the top of the stairs and the bottom of the stairs. If you do not have them, ask someone to add them for you. What else can I do to help prevent falls?  Wear shoes that:  Do not have high heels.  Have rubber bottoms.  Are comfortable and fit you well.  Are closed at the toe. Do not wear sandals.  If you use a stepladder:  Make sure that it is fully opened. Do not climb a closed stepladder.  Make sure that both sides of the stepladder are locked into place.  Ask  someone to hold it for you, if possible.  Clearly mark and make sure that you can see:  Any grab bars or handrails.  First and last steps.  Where the edge of each step is.  Use tools that help you move around (mobility aids) if they are needed. These include:  Canes.  Walkers.  Scooters.  Crutches.  Turn on the lights when you go into a dark area. Replace any light bulbs as soon as they burn out.  Set up your furniture so you have a clear path. Avoid moving your furniture around.  If any of your floors are uneven, fix them.  If there are any pets around you, be aware of where they are.  Review your medicines with your doctor. Some medicines can make you feel dizzy. This can increase your chance of falling. Ask your doctor what other things that you can do to help prevent falls. This information is not intended to replace advice given to you by your health care provider. Make sure you discuss any questions you have with your health care provider. Document Released: 09/16/2009 Document Revised: 04/27/2016 Document Reviewed: 12/25/2014 Elsevier Interactive Patient Education  2017 [employer].

## 2021-02-09 ENCOUNTER — Telehealth: Payer: 59

## 2021-02-09 ENCOUNTER — Telehealth: Payer: Self-pay | Admitting: General Practice

## 2021-02-09 NOTE — Telephone Encounter (Signed)
  Chronic Care Management   Outreach Note  02/09/2021 Name: Vanessa Oneal MRN: [id number] DOB: 1951/07/12  Referred by: Dorcas Carrow, DO Reason for referral : Appointment (RNCM: Follow up for Chronic Disease Management and Care Coordination Needs/2nd attempt)   A second unsuccessful telephone outreach was attempted today. The patient was referred to the case management team for assistance with care management and care coordination. The patient answered the phone this am but stated she was not awake good and ask for a call after lunch. Made call to the patient at 2:45 pm and it went to VM. Was unable to leave a VM because it had not been set up.   Follow Up Plan: The care management team will reach out to the patient again over the next 30 to 60 days.   Alto Denver RN, MSN, CCM Community Care Coordinator [place]  [facility] Mobile: [phone]

## 2021-03-10 ENCOUNTER — Telehealth: Payer: Self-pay | Admitting: *Deleted

## 2021-03-10 NOTE — Chronic Care Management (AMB) (Signed)
  Care Management   Note  03/10/2021 Name: TORY MCKISSACK MRN: [id number] DOB: 1951-06-10  NAIA RUFF is a 70 y.o. year old female who is a primary care patient of Dorcas Carrow, DO and is actively engaged with the care management team. I reached out to [name] by phone today to assist with re-scheduling a follow up visit with the RN Case Manager  Follow up plan: Unsuccessful telephone outreach attempt made. The care management team will reach out to the patient again over the next 7 days.  If patient returns call to provider office, please advise to call Embedded Care Management Care Guide Avie Arenas at [phone] Ilijah Doucet [facility] Guide, Embedded Care Coordination [facility]  Care Management

## 2021-03-10 NOTE — Telephone Encounter (Signed)
Please reschedule f/u RN CM for CFP

## 2021-03-14 NOTE — Chronic Care Management (AMB) (Signed)
  Care Management   Note  03/14/2021 Name: Vanessa Oneal MRN: [id number] DOB: Aug 08, 1951  Vanessa Oneal is a 70 y.o. year old female who is a primary care patient of Dorcas Carrow, DO and is actively engaged with the care management team. I reached out to [name] by phone today to assist with re-scheduling a follow up visit with the RN Case Manager  Follow up plan: Telephone appointment with care management team member scheduled for: 04/20/2021  [facility] Guide, Embedded Care Coordination [facility]  Care Management

## 2021-03-14 NOTE — Telephone Encounter (Signed)
Patient has been rescheduled for 04/20/2021    Vanessa Oneal 

## 2021-03-26 ENCOUNTER — Other Ambulatory Visit: Payer: Self-pay

## 2021-03-26 ENCOUNTER — Emergency Department
Admission: EM | Admit: 2021-03-26 | Discharge: 2021-03-27 | Disposition: A | Discharge status: Home / Self Care | Payer: 59 | Attending: Emergency Medicine | Admitting: Emergency Medicine

## 2021-03-26 DIAGNOSIS — F1310 Sedative, hypnotic or anxiolytic abuse, uncomplicated: Secondary | ICD-10-CM | POA: Insufficient documentation

## 2021-03-26 DIAGNOSIS — I2510 Atherosclerotic heart disease of native coronary artery without angina pectoris: Secondary | ICD-10-CM | POA: Insufficient documentation

## 2021-03-26 DIAGNOSIS — F17210 Nicotine dependence, cigarettes, uncomplicated: Secondary | ICD-10-CM | POA: Insufficient documentation

## 2021-03-26 LAB — CBC WITH DIFFERENTIAL/PLATELET
Abs Immature Granulocytes: 0.01 10*3/uL (ref 0.00–0.07)
Basophils Absolute: 0.1 10*3/uL (ref 0.0–0.1)
Basophils Relative: 1 %
Eosinophils Absolute: 0.1 10*3/uL (ref 0.0–0.5)
Eosinophils Relative: 2 %
HCT: 37.2 % (ref 36.0–46.0)
Hemoglobin: 13 g/dL (ref 12.0–15.0)
Immature Granulocytes: 0 %
Lymphocytes Relative: 34 %
Lymphs Abs: 2.1 10*3/uL (ref 0.7–4.0)
MCH: 34.1 pg — ABNORMAL HIGH (ref 26.0–34.0)
MCHC: 34.9 g/dL (ref 30.0–36.0)
MCV: 97.6 fL (ref 80.0–100.0)
Monocytes Absolute: 0.5 10*3/uL (ref 0.1–1.0)
Monocytes Relative: 8 %
Neutro Abs: 3.5 10*3/uL (ref 1.7–7.7)
Neutrophils Relative %: 55 %
Platelets: 233 10*3/uL (ref 150–400)
RBC: 3.81 MIL/uL — ABNORMAL LOW (ref 3.87–5.11)
RDW: 12.3 % (ref 11.5–15.5)
WBC: 6.3 10*3/uL (ref 4.0–10.5)
nRBC: 0 % (ref 0.0–0.2)

## 2021-03-26 LAB — COMPREHENSIVE METABOLIC PANEL
ALT: 11 U/L (ref 0–44)
AST: 20 U/L (ref 15–41)
Albumin: 3.4 g/dL — ABNORMAL LOW (ref 3.5–5.0)
Alkaline Phosphatase: 54 U/L (ref 38–126)
Anion gap: 10 (ref 5–15)
BUN: 12 mg/dL (ref 8–23)
CO2: 23 mmol/L (ref 22–32)
Calcium: 8.4 mg/dL — ABNORMAL LOW (ref 8.9–10.3)
Chloride: 106 mmol/L (ref 98–111)
Creatinine, Ser: 0.68 mg/dL (ref 0.44–1.00)
GFR, Estimated: >60 mL/min (ref >60)
Glucose, Bld: 103 mg/dL — ABNORMAL HIGH (ref 70–99)
Potassium: 3.4 mmol/L — ABNORMAL LOW (ref 3.5–5.1)
Sodium: 139 mmol/L (ref 135–145)
Total Bilirubin: 0.5 mg/dL (ref 0.3–1.2)
Total Protein: 6.5 g/dL (ref 6.5–8.1)

## 2021-03-26 MED ORDER — ONDANSETRON HCL 4 MG/2ML IJ SOLN
4.0000 mg | Freq: Once | INTRAMUSCULAR | Status: AC
  Administered 2021-03-26: 4 mg via INTRAVENOUS

## 2021-03-26 MED ORDER — ONDANSETRON HCL 4 MG/2ML IJ SOLN
INTRAMUSCULAR | Status: AC
  Filled 2021-03-26: qty 2

## 2021-03-26 MED ORDER — LORAZEPAM 2 MG/ML IJ SOLN
1.0000 mg | Freq: Once | INTRAMUSCULAR | Status: AC
  Administered 2021-03-26: 1 mg via INTRAVENOUS
  Filled 2021-03-26: qty 1

## 2021-03-26 MED ORDER — CHLORDIAZEPOXIDE HCL 25 MG PO CAPS
25.0000 mg | ORAL_CAPSULE | Freq: Once | ORAL | Status: AC
  Administered 2021-03-26: 25 mg via ORAL
  Filled 2021-03-26: qty 1

## 2021-03-26 MED ORDER — CHLORDIAZEPOXIDE HCL 25 MG PO CAPS: 50.0000 mg | ORAL_CAPSULE | Freq: Three times a day (TID) | ORAL | Status: DC | PRN

## 2021-03-26 MED ORDER — MELATONIN 5 MG PO TABS
5.0000 mg | ORAL_TABLET | Freq: Every day | ORAL | Status: DC
  Administered 2021-03-26: 5 mg via ORAL
  Filled 2021-03-26: qty 1

## 2021-03-26 MED ORDER — CHLORDIAZEPOXIDE HCL 25 MG PO CAPS: 50.0000 mg | ORAL_CAPSULE | Freq: Three times a day (TID) | ORAL | Status: DC

## 2021-03-26 NOTE — ED Notes (Signed)
Verbal from EDP Funke to go ahead and give 25mg  of librium now.

## 2021-03-26 NOTE — ED Notes (Signed)
Pt reports nausea has decreased some.

## 2021-03-26 NOTE — ED Notes (Signed)
Patient resting quietly, no acute distress noted. °

## 2021-03-26 NOTE — ED Notes (Signed)
Pt given another warm blanket. Visitor leaving bedside now.

## 2021-03-26 NOTE — ED Notes (Signed)
Visitor at bedside.

## 2021-03-26 NOTE — ED Notes (Signed)
Pt laying calmly in bed; pt initially refused ativan stating she didn't think it would help. Pt agreed to try it after some education. Skin dry. Resp reg/unlabored. Pt talkative. Pt denies CP. Pt reports HA.

## 2021-03-26 NOTE — ED Notes (Signed)
Pt requesting klonopin and xanax. States last took xanax yesterday and klonopin the day before. EDP smith notified of pt's request via secure chat. Pt wasn't shaky before going to restroom. Then while asking for meds was shaking. Pt is now laying in bed.

## 2021-03-26 NOTE — ED Notes (Signed)
Number given for niece [name] [phone]

## 2021-03-26 NOTE — ED Notes (Addendum)
Pt given food tray from fridge and drink. Also called dietary to order lunch tray for pt. Dietary staff states they will send a tray soon. Verbal from EDP Katrinka Blazing that pt can eat.

## 2021-03-26 NOTE — BH Assessment (Signed)
Referral information for Psychiatric Hospitalization faxed to;   [name] [phone]),   . [facility] [phone])  . [facility] ([phone]--[phone])  . [facility] [phone]),  . [facility] [phone], [phone], [phone] or [phone]),   . [facility] [phone])  . [facility] [phone] or [phone])  . [facility] [phone]),   . [facility] [phone] -or- [phone]),   . [facility] [phone]).  [facility] [phone])  . [facility] [facility] [phone])

## 2021-03-26 NOTE — ED Provider Notes (Signed)
[facility] Emergency Department Provider Note ____________________________________________   Event Date/Time   First MD Initiated Contact with Patient 03/26/21 1046     (approximate)  I have reviewed the triage vital signs and the nursing notes.  HISTORY  Chief Complaint Withdrawal   HPI Vanessa Oneal is a 70 y.o. femalewho presents to the ED for evaluation of benzodiazepine withdrawals  Chart review indicates history of depression, fibromyalgia and polysubstance abuse.  Patient reports being stable on Suboxone for greater than a decade.  Further reports being on benzodiazepines chronically for about 30 years.  She reports her physician discontinued her benzodiazepines 10 years ago, but she has been buying benzodiazepines "on the street" for the past 10 years.  She reports taking what she believes is 1 mg Xanax or Klonopin once daily for the past 10 years.  She reports running out of her benzodiazepines last night not having an immediate source.  She reports using one of her sons Xanax last night with some improvement of her symptoms, and was able to sleep last night, but woke up "feeling awful" this morning with generalized tremulousness and feeling unwell.  She reports drinking some liquor alcohol this morning with little improvement of her symptoms.  She denies drinking alcohol every day.  Denies recent fevers, illnesses, syncopal episodes, seizures or falls.  Past Medical History:  Diagnosis Date  . Arthritis    RA  . Depression   . Fibromyalgia   . GERD (gastroesophageal reflux disease)   . H/O drug dependence (HCC)    opiods  . Wears dentures    full upper    Patient Active Problem List   Diagnosis Date Noted  . Aortic atherosclerosis (HCC) 01/07/2021  . Cough 01/07/2021  . CAD (coronary artery disease) 02/28/2018  . Centrilobular emphysema (HCC) 02/28/2018  . Chronic hepatitis C without hepatic coma (HCC) 11/22/2017  . Opiate abuse,  continuous (HCC) 11/19/2017  . Anxiety disorder 11/19/2017  . Osteoarthritis 11/19/2017  . Vitamin D deficiency, unspecified 05/30/2016  . Malnutrition (HCC) 05/22/2016    Past Surgical History:  Procedure Laterality Date  . BREAST SURGERY     Silicone implants, then removal  . CATARACT EXTRACTION W/PHACO Left 05/06/2019   Procedure: CATARACT EXTRACTION PHACO AND INTRAOCULAR LENS PLACEMENT (IOC) LEFT;  Surgeon: Galen Manila, MD;  [facility];  Service: Ophthalmology;  Laterality: Left;  . TUBAL LIGATION      Prior to Admission medications   Medication Sig Start Date End Date Taking? Authorizing Provider  albuterol (VENTOLIN HFA) 108 (90 Base) MCG/ACT inhaler Inhale 2 puffs into the lungs every 6 (six) hours as needed for wheezing or shortness of breath. 01/07/21   Cannady, Corrie Dandy T, NP  diclofenac Sodium (VOLTAREN) 1 % GEL Apply 4 g topically 4 (four) times daily. 12/23/20   Johnson, Megan P, DO  ondansetron (ZOFRAN ODT) 4 MG disintegrating tablet Take 1 tablet (4 mg total) by mouth every 8 (eight) hours as needed for nausea or vomiting. 12/23/20   Johnson, Megan P, DO  SUBOXONE 8-2 MG FILM Take 1 Film by mouth 2 (two) times daily.  10/31/17   [provider]  Vitamin D, Ergocalciferol, (DRISDOL) 1.25 MG (50000 UNIT) CAPS capsule Take 1 capsule (50,000 Units total) by mouth every 7 (seven) days. 12/24/20   Johnson, Megan P, DO  vortioxetine HBr (TRINTELLIX) 5 MG TABS tablet Take 1 tablet (5 mg total) by mouth daily. 12/23/20   Dorcas Carrow, DO  Allergies Gabapentin  Family History  Problem Relation Age of Onset  . Suicidality Father   . COPD Sister     Social History Social History   Tobacco Use  . Smoking status: Light Tobacco Smoker    Packs/day: 0.25    Years: 56.00    Pack years: 14.00    Types: Cigarettes    Last attempt to quit: 11/04/2019    Years since quitting: 1.3  . Smokeless tobacco: Never Used  . Tobacco comment: 1 pack with  last a couple days   Vaping Use  . Vaping Use: Former  Substance Use Topics  . Alcohol use: Not Currently  . Drug use: No    Review of Systems  Constitutional: No fever/chills Eyes: No visual changes. ENT: No sore throat. Cardiovascular: Denies chest pain. Respiratory: Denies shortness of breath. Gastrointestinal: No abdominal pan.  No nausea, no vomiting.  No diarrhea.  No constipation. Genitourinary: Negative for dysuria. Musculoskeletal: Negative for back pain. Skin: Negative for rash. Neurological: Negative for headaches, focal weakness or numbness.  ____________________________________________   PHYSICAL EXAM:  VITAL SIGNS: Vitals:   03/26/21 1311 03/26/21 1512  BP: 140/90   Pulse: 81 93  Resp: 18   Temp:    SpO2: 99% 99%      Constitutional: Alert and oriented.  Low BMI and generalized tremulousness. Eyes: Conjunctivae are normal. PERRL. EOMI. Head: Atraumatic. Nose: No congestion/rhinnorhea. Mouth/Throat: Mucous membranes are moist.  Oropharynx non-erythematous. Neck: No stridor. No cervical spine tenderness to palpation. Cardiovascular: Normal rate, regular rhythm. Grossly normal heart sounds.  Good peripheral circulation. Respiratory: Normal respiratory effort.  No retractions. Lungs CTAB. Gastrointestinal: Soft , nondistended, nontender to palpation. No CVA tenderness. Musculoskeletal: No lower extremity tenderness nor edema.  No joint effusions. No signs of acute trauma. Neurologic:  Normal speech and language. No gross focal neurologic deficits are appreciated. No gait instability noted. Skin:  Skin is warm, dry and intact. No rash noted. Psychiatric: Mood and affect are normal. Speech and behavior are normal.  ____________________________________________   LABS (all labs ordered are listed, but only abnormal results are displayed)  Labs Reviewed  CBC WITH DIFFERENTIAL/PLATELET - Abnormal; Notable for the following components:      Result Value    RBC 3.81 (*)    MCH 34.1 (*)    All other components within normal limits  COMPREHENSIVE METABOLIC PANEL - Abnormal; Notable for the following components:   Potassium 3.4 (*)    Glucose, Bld 103 (*)    Calcium 8.4 (*)    Albumin 3.4 (*)    All other components within normal limits    ____________________________________________   PROCEDURES and INTERVENTIONS  Procedure(s) performed (including Critical Care):  Procedures  Medications  LORazepam (ATIVAN) injection 1 mg (1 mg Intravenous Given 03/26/21 1122)  chlordiazePOXIDE (LIBRIUM) capsule 25 mg (25 mg Oral Given 03/26/21 1304)    ____________________________________________   MDM / ED COURSE   70 year old woman presents to the ED voluntarily with complaints of benzodiazepine withdrawals requiring psychiatric evaluation.  Normal vitals on room air.  Exam with generalized tremulousness without focal seizure activity, signs of trauma, neurologic vascular deficits.  Physical labs are benign.  Provide empiric benzodiazepines with improvement of her symptoms.  No indications for IVC.  We will hold voluntarily for psychiatric evaluation to assess for possible inpatient detox versus outpatient Librium taper for her benzodiazepine abuse and withdrawals.  Clinical Course as of 03/26/21 1547  Sat Mar 26, 2021  1243 Reassessed. Feeling better. Requesting  help for addictions [DS]  1314 Niece pulls me aside and expresses concern that patient also drinks quite a bit more than she initially laid onto to me.  Niece Reports it will take 2-3 days to finish 1/5 of vodka he drinks multiple but liquors per day. [DS]    Clinical Course User Index [DS] Delton Prairie, MD    ____________________________________________   FINAL CLINICAL IMPRESSION(S) / ED DIAGNOSES  Final diagnoses:  Benzodiazepine abuse [facility])     ED Discharge Orders    None       Bridgit Eynon   Note:  This document was prepared using Dragon voice recognition software  and may include unintentional dictation errors.   Delton Prairie, MD 03/26/21 [phone]

## 2021-03-26 NOTE — ED Notes (Signed)
Pt steady up to restroom.

## 2021-03-26 NOTE — ED Notes (Signed)
EDP Funke notified via secure chat that pt requesting another dose of ativan.

## 2021-03-26 NOTE — ED Notes (Signed)
Secretary notified in person of pt's new TTS order; pt currently voluntary.

## 2021-03-26 NOTE — ED Notes (Signed)
Psych staff member Calvin at bedside.

## 2021-03-26 NOTE — ED Notes (Signed)
EDP Katrinka Blazing talked with pt at bedside to determine what she would like addressed while in ER. Pt stated would like help with her addiction. EDP verbal that he'll get social work and psychiatry involved.

## 2021-03-26 NOTE — ED Notes (Signed)
EDP Funke notified in person pt suddenly c/o nausea. Pt requesting med for this.

## 2021-03-26 NOTE — ED Notes (Signed)
Pt up to restroom.

## 2021-03-26 NOTE — ED Notes (Signed)
Called SOC/Telepsych for TTS Consult @ 01:10pm

## 2021-03-26 NOTE — ED Notes (Signed)
As expected, verbal from EDP Katrinka Blazing that he will not be placing orders for meds pt requested.

## 2021-03-26 NOTE — ED Triage Notes (Signed)
Pt arrives via EMS from home after her niece called stating that the pt is in withdrawals from her suboxone- pt has not taken it in 2 days- pt also has not eaten in 2 days- per pt she drank "a little bit" of a bootlegger this AM- pt very shaky

## 2021-03-26 NOTE — BH Assessment (Signed)
Comprehensive Clinical Assessment (CCA) Screening, Triage and Referral Note  03/26/2021 Vanessa Oneal [id number]  Who presents to the ER because she was unable to manage her withdrawals symptoms at home. Patient state she stopped taking her suboxone approximately two days ago. She states she's been prescribed the same dosage and on it for approximately ten years. She stopped taking it because she wants to get back on Klonopin. Her symptoms of withdrawal are; nausea, cold chills, unable to sleep, poor appetite and thoughts are not clear.  During the interview the patient was calm, cooperative and pleasant. She was able to provide the appropriate answers to the questions. She denies SI/HI and AV/H. However, she did say she has had the thoughts about dying because of the withdrawals but have not plans or intentions of following through with it.  Chief Complaint:  Chief Complaint  Patient presents with  . Withdrawal   Visit Diagnosis: Substance Use Disorder  Patient Reported Information How did you hear about [place]? Self   Referral name: Self   Referral phone number: No data recorded Whom do you see for routine medical problems? No data recorded  Practice/Facility Name: No data recorded  Practice/Facility Phone Number: No data recorded  Name of Contact: No data recorded  Contact Number: No data recorded  Contact Fax Number: No data recorded  Prescriber Name: No data recorded  Prescriber Address (if known): No data recorded What Is the Reason for Your Visit/Call Today? Seeking detox. She having withdrawals.  How Long Has This Been Causing You Problems? 1 wk - 1 month  Have You Recently Been in Any Inpatient Treatment (Hospital/Detox/Crisis Center/28-Day Program)? No   Name/Location of Program/Hospital:No data recorded  How Long Were You There? No data recorded  When Were You Discharged? No data recorded Have You Ever Received Services From [facility] Before? Yes   Who Do You See  at [facility]? Mental Health and Medical Treatment  Have You Recently Had Any Thoughts About Hurting Yourself? No   Are You Planning to Commit Suicide/Harm Yourself At This time?  No  Have you Recently Had Thoughts About Hurting Someone [name]? No   Explanation: No data recorded Have You Used Any Alcohol or Drugs in the Past 24 Hours? Yes   How Long Ago Did You Use Drugs or Alcohol?  No data recorded  What Did You Use and How Much? Alcohol/shot  What Do You Feel Would Help You the Most Today? Alcohol or Drug Use Treatment  Do You Currently Have a Therapist/Psychiatrist? No   Name of Therapist/Psychiatrist: No data recorded  Have You Been Recently Discharged From Any Office Practice or Programs? No   Explanation of Discharge From Practice/Program:  No data recorded    CCA Screening Triage Referral Assessment Type of Contact: Face-to-Face   Is this Initial or Reassessment? No data recorded  Date Telepsych consult ordered in [facility]:  No data recorded  Time Telepsych consult ordered in [facility]:  No data recorded Patient Reported Information Reviewed? Yes   Patient Left Without Being Seen? No data recorded  Reason for Not Completing Assessment: No data recorded Collateral Involvement: No data recorded Does Patient Have a Court Appointed Legal Guardian? No data recorded  Name and Contact of Legal Guardian:  No data recorded If Minor and Not Living with Parent(s), Who has Custody? No data recorded Is CPS involved or ever been involved? Never  Is APS involved or ever been involved? Never  Patient Determined To Be At Risk for  Harm To Self or Others Based on Review of Patient Reported Information or Presenting Complaint? No   Method: No data recorded  Availability of Means: No data recorded  Intent: No data recorded  Notification Required: No data recorded  Additional Information for Danger to Others Potential:  No data recorded  Additional Comments for Danger to Others Potential:  No  data recorded  Are There Guns or Other Weapons in Your Home?  No data recorded   Types of Guns/Weapons: No data recorded   Are These Weapons Safely Secured?                              No data recorded   Who Could Verify You Are Able To Have These Secured:    No data recorded Do You Have any Outstanding Charges, Pending Court Dates, Parole/Probation? No data recorded Contacted To Inform of Risk of Harm To Self or Others: No data recorded Location of Assessment: [facility] ED  Does Patient Present under Involuntary Commitment? No   IVC Papers Initial File Date: No data recorded  [place] of Residence: [place]  Patient Currently Receiving the Following Services: Medication Management   Determination of Need: No data recorded  Options For Referral: Inpatient Hospitalization  Lilyan Gilford MS, LCAS, [facility], [facility] Therapeutic Triage Specialist 03/26/2021 3:52 PM

## 2021-03-26 NOTE — ED Notes (Signed)
Resting quietly with eyes closed at this time

## 2021-03-26 NOTE — ED Notes (Signed)
Pt given lunch tray that dietary dropped off.

## 2021-03-26 NOTE — BH Assessment (Addendum)
Referral Check for detox;    [facility] [phone]),    [facility] [phone]) Staff reports to contact back after [facility] ([phone]--[phone])   [facility] [phone]---[phone]),   [facility] [phone], [phone], [phone] or [phone]),    [facility] [phone]) No beds available currently   [facility] [phone] or [phone])   [facility] [phone]),    [facility] [phone] -or- [phone]),    [facility] [phone]).   [facility] [phone]) No answer   [facility] [phone]) Danella Deis reports referral received and will send a e-mail to the appropriate staff to contact TTS back regarding the patient

## 2021-03-26 NOTE — ED Notes (Signed)
Pt given urine cup for sample and encouraged, but states, "Fuck this, this is embarrassing with you standing in the restroom". This writer explained to pt that she is a fall risk d/t being shaky and withdrawing. Pt understands and still continues to not provide urine sample.

## 2021-03-26 NOTE — ED Notes (Signed)
VOL, pending [facility] consult and dextox

## 2021-03-27 DIAGNOSIS — F1310 Sedative, hypnotic or anxiolytic abuse, uncomplicated: Secondary | ICD-10-CM | POA: Diagnosis not present

## 2021-03-27 MED ORDER — TRAZODONE HCL 50 MG PO TABS
50.0000 mg | ORAL_TABLET | ORAL | Status: AC
  Administered 2021-03-27: 50 mg via ORAL
  Filled 2021-03-27: qty 1

## 2021-03-27 NOTE — ED Notes (Signed)
Pt's niece coming to pick her up. Pt waiting in lobby for ride.

## 2021-03-27 NOTE — ED Notes (Signed)
Pt walked to interview room. Report given to [facility] MD.

## 2021-03-27 NOTE — ED Notes (Signed)
VOL/pending [facility] consult

## 2021-04-20 ENCOUNTER — Telehealth: Payer: 59

## 2021-04-20 ENCOUNTER — Telehealth: Payer: Self-pay | Admitting: General Practice

## 2021-04-20 NOTE — Telephone Encounter (Cosign Needed)
  Care Management   Follow Up Note   04/20/2021 Name: Vanessa Oneal MRN: [id number] DOB: Apr 09, 1951   Referred by: Dorcas Carrow, DO Reason for referral : Chronic Care Management (RNCM: Follow up for Chronic Disease Management and Care Coordination Needs -3rd attempt)   Third unsuccessful telephone outreach was attempted today. The patient was referred to the case management team for assistance with care management and care coordination. The patient's primary care provider has been notified of our unsuccessful attempts to make or maintain contact with the patient. The care management team is pleased to engage with this patient at any time in the future should he/she be interested in assistance from the care management team. Unable to leave a message.   Follow Up Plan: We have been unable to make contact with the patient for follow up. The care management team is available to follow up with the patient after provider conversation with the patient regarding recommendation for care management engagement and subsequent re-referral to the care management team.   Alto Denver RN, MSN, CCM Community Care Coordinator   [facility] Mobile: [phone]

## 2021-05-24 ENCOUNTER — Other Ambulatory Visit: Payer: Self-pay | Admitting: Family Medicine

## 2021-05-24 NOTE — Telephone Encounter (Signed)
Requested medication (s) are due for refill today: no   Requested medication (s) are on the active medication list: yes   Last refill:  04/18/2021  Future visit scheduled: yes  Notes to clinic: this refill cannot be delegated    Requested Prescriptions  Pending Prescriptions Disp Refills   Vitamin D, Ergocalciferol, (DRISDOL) 1.25 MG (50000 UNIT) CAPS capsule [Pharmacy Med Name: ergocalciferol (vitamin D2) 1,250 mcg (50,000 unit) capsule] 12 capsule 0    Sig: TAKE ONE CAPSULE BY MOUTH EVERY 7 DAYS      Endocrinology:  Vitamins - Vitamin D Supplementation Failed - 05/24/2021  3:11 PM      Failed - 50,000 IU strengths are not delegated      Failed - Ca in normal range and within 360 days    Calcium  Date Value Ref Range Status  03/26/2021 8.4 (L) 8.9 - 10.3 mg/dL Final   Calcium, Total  Date Value Ref Range Status  01/03/2014 7.4 (L) 8.5 - 10.1 mg/dL Final          Failed - Vitamin D in normal range and within 360 days    Vit D, 25-Hydroxy  Date Value Ref Range Status  12/23/2020 23.9 (L) 30.0 - 100.0 ng/mL Final    Comment:    Vitamin D deficiency has been defined by the Institute of Medicine and an Endocrine Society practice guideline as a level of serum 25-OH vitamin D less than 20 ng/mL (1,2). The Endocrine Society went on to further define vitamin D insufficiency as a level between 21 and 29 ng/mL (2). 1. IOM (Institute of Medicine). 2010. Dietary reference    intakes for calcium and D. [place]: The    [employer]. 2. Holick MF, Binkley [place], Bischoff-Ferrari HA, et al.    Evaluation, treatment, and prevention of vitamin D    deficiency: an Endocrine Society clinical practice    guideline. JCEM. 2011 Jul; 96(7):1911-30.           Passed - Phosphate in normal range and within 360 days    Phosphorus  Date Value Ref Range Status  12/23/2020 3.8 3.0 - 4.3 mg/dL Final          Passed - Valid encounter within last 12 months    Recent Outpatient  Visits           4 months ago Centrilobular emphysema (HCC)   [facility] [place], [place] T, NP   5 months ago Leg cramps   [facility], [place], DO   9 months ago Facial cellulitis   [facility] [place], Megan P, DO   9 months ago Suspected COVID-19 virus infection   [facility], Megan P, DO   11 months ago Osteoarthritis of spine with radiculopathy, cervical region   [facility], [place], DO       Future Appointments             In 8 months [facility], [facility]

## 2021-05-26 NOTE — Telephone Encounter (Signed)
Pt has apt on 06/03/2021 

## 2021-06-03 ENCOUNTER — Other Ambulatory Visit: Payer: Self-pay

## 2021-06-03 ENCOUNTER — Encounter: Payer: Self-pay | Admitting: Family Medicine

## 2021-06-03 ENCOUNTER — Ambulatory Visit (INDEPENDENT_AMBULATORY_CARE_PROVIDER_SITE_OTHER): Payer: 59 | Admitting: Family Medicine

## 2021-06-03 VITALS — BP 161/75 | HR 97 | Temp 98.4°F | Wt 79.6 lb

## 2021-06-03 DIAGNOSIS — F1310 Sedative, hypnotic or anxiolytic abuse, uncomplicated: Secondary | ICD-10-CM

## 2021-06-03 DIAGNOSIS — M25531 Pain in right wrist: Secondary | ICD-10-CM

## 2021-06-03 DIAGNOSIS — F1110 Opioid abuse, uncomplicated: Secondary | ICD-10-CM

## 2021-06-03 DIAGNOSIS — F1010 Alcohol abuse, uncomplicated: Secondary | ICD-10-CM | POA: Diagnosis not present

## 2021-06-03 DIAGNOSIS — F419 Anxiety disorder, unspecified: Secondary | ICD-10-CM

## 2021-06-03 DIAGNOSIS — I700 Atherosclerosis of aorta: Secondary | ICD-10-CM

## 2021-06-03 MED ORDER — DICLOFENAC SODIUM 1 % EX GEL: 4.0000 g | Freq: Four times a day (QID) | CUTANEOUS | 6 refills | Status: DC

## 2021-06-03 MED ORDER — PROMETHAZINE HCL 12.5 MG PO TABS: 12.5000 mg | ORAL_TABLET | Freq: Three times a day (TID) | ORAL | 1 refills | Status: DC | PRN

## 2021-06-03 NOTE — Patient Instructions (Signed)
[facility] psychiatry [address] [place], [place] [address]  Walk in any Monday, Wednesday or Friday  between 8:00 am and 3:00 pm and complete  our consumer paperwork

## 2021-06-03 NOTE — Progress Notes (Signed)
BP (!) 161/75   Pulse 97   Temp 98.4 F (36.9 C)   Wt 79 lb 9.6 oz (36.1 kg)   SpO2 95%   BMI 15.04 kg/m    Subjective:    Patient ID: Vanessa Oneal, female    DOB: November 09, 1951, 70 y.o.   MRN: [id number]  HPI: Vanessa Oneal is a 70 y.o. female-   Chief Complaint  Patient presents with   Anxiety   vitamin d    Medication Problem    Patient states her nausea medication seems to be making her more sick when she takes its. States it taste like sugar and makes her more nauseous    Wrist Pain    Patient states she is having right wrist pain, has been wearing a wrist brace but doesn't seem to help   Vanessa Oneal presents today after being lost to follow up for almost 6 months. She was supposed to follow up after 1 month. Since that visit, she was hospitalized for benzodiazepine abuse. She had been buying benzodiazepines off the street for about 10 years. She did not go to any detox. She was sent home with trazodone. She notes that she came off her suboxone on her own. Because she would like to go back on benzos. She notes that nothing else helps with her stress and anxiety.  ANXIETY/STRESS Duration:uncontrolled Anxious mood: yes  Excessive worrying: yes Irritability: yes  Sweating: no Nausea: no Palpitations:no Hyperventilation: no Panic attacks: yes Agoraphobia: no  Obscessions/compulsions: yes Depressed mood: yes Depression screen [facility] 2/9 06/03/2021 01/28/2021 01/07/2021 03/10/2020 01/26/2020  Decreased Interest 3 3 0 0 0  Down, Depressed, Hopeless 3 3 1 2 1   PHQ - 2 Score 6 6 1 2 1   Altered sleeping 3 0 1 1 -  Tired, decreased energy 3 2 0 1 -  Change in appetite 3 0 0 1 -  Feeling bad or failure about yourself  0 0 0 0 -  Trouble concentrating 3 0 0 2 -  Moving slowly or fidgety/restless 0 0 0 0 -  Suicidal thoughts 0 0 0 0 -  PHQ-9 Score 18 8 2 7  -  Difficult doing work/chores Not difficult at all Somewhat difficult - Somewhat difficult -   Anhedonia: yes Weight changes:  no Insomnia: yes hard to fall asleep  Hypersomnia: no Fatigue/loss of energy: yes Feelings of worthlessness: yes Feelings of guilt: yes Impaired concentration/indecisiveness: yes Suicidal ideations: no  Crying spells: yes Recent Stressors/Life Changes: yes   Relationship problems: no   Family stress: no     Financial stress: no    Job stress: no    Recent death/loss: no  Relevant past medical, surgical, family and social history reviewed and updated as indicated. Interim medical history since our last visit reviewed. Allergies and medications reviewed and updated.  Review of Systems  Constitutional: Negative.   Respiratory: Negative.    Cardiovascular: Negative.   Musculoskeletal:  Positive for arthralgias. Negative for back pain, gait problem, joint swelling, myalgias, neck pain and neck stiffness.  Skin: Negative.   Neurological:  Positive for tremors. Negative for dizziness, seizures, syncope, facial asymmetry, speech difficulty, weakness, light-headedness, numbness and headaches.  Psychiatric/Behavioral:  Positive for dysphoric mood. Negative for agitation, behavioral problems, confusion, decreased concentration, hallucinations, self-injury, sleep disturbance and suicidal ideas. The patient is nervous/anxious. The patient is not hyperactive.    Per HPI unless specifically indicated above     Objective:    BP (!) 161/75  Pulse 97   Temp 98.4 F (36.9 C)   Wt 79 lb 9.6 oz (36.1 kg)   SpO2 95%   BMI 15.04 kg/m   Wt Readings from Last 3 Encounters:  06/03/21 79 lb 9.6 oz (36.1 kg)  03/26/21 100 lb (45.4 kg)  01/28/21 87 lb (39.5 kg)    Physical Exam Vitals and nursing note reviewed.  Constitutional:      General: She is not in acute distress.    Appearance: Normal appearance. She is not ill-appearing, toxic-appearing or diaphoretic.  HENT:     Head: Normocephalic and atraumatic.     Right Ear: External ear normal.     Left Ear: External ear normal.     Nose:  Nose normal.     Mouth/Throat:     Mouth: Mucous membranes are moist.     Pharynx: Oropharynx is clear.  Eyes:     General: No scleral icterus.       Right eye: No discharge.        Left eye: No discharge.     Extraocular Movements: Extraocular movements intact.     Conjunctiva/sclera: Conjunctivae normal.     Pupils: Pupils are equal, round, and reactive to light.  Cardiovascular:     Rate and Rhythm: Normal rate and regular rhythm.     Pulses: Normal pulses.     Heart sounds: Normal heart sounds. No murmur heard.   No friction rub. No gallop.  Pulmonary:     Effort: Pulmonary effort is normal. No respiratory distress.     Breath sounds: Normal breath sounds. No stridor. No wheezing, rhonchi or rales.  Chest:     Chest wall: No tenderness.  Musculoskeletal:        General: Normal range of motion.     Cervical back: Normal range of motion and neck supple.  Skin:    General: Skin is warm and dry.     Capillary Refill: Capillary refill takes less than 2 seconds.     Coloration: Skin is not jaundiced or pale.     Findings: No bruising, erythema, lesion or rash.  Neurological:     General: No focal deficit present.     Mental Status: She is alert and oriented to person, place, and time. Mental status is at baseline.  Psychiatric:        Mood and Affect: Mood normal.        Behavior: Behavior normal.        Thought Content: Thought content normal.        Judgment: Judgment normal.    Results for orders placed or performed during the hospital encounter of 03/26/21  CBC with Differential/Platelet  Result Value Ref Range   WBC 6.3 4.0 - 10.5 K/uL   RBC 3.81 (L) 3.87 - 5.11 MIL/uL   Hemoglobin 13.0 12.0 - 15.0 g/dL   HCT 53.2 99.2 - 42.6 %   MCV 97.6 80.0 - 100.0 fL   MCH 34.1 (H) 26.0 - 34.0 pg   MCHC 34.9 30.0 - 36.0 g/dL   RDW 83.4 19.6 - 22.2 %   Platelets 233 150 - 400 K/uL   nRBC 0.0 0.0 - 0.2 %   Neutrophils Relative % 55 %   Neutro Abs 3.5 1.7 - 7.7 K/uL    Lymphocytes Relative 34 %   Lymphs Abs 2.1 0.7 - 4.0 K/uL   Monocytes Relative 8 %   Monocytes Absolute 0.5 0.1 - 1.0 K/uL   Eosinophils Relative 2 %  Eosinophils Absolute 0.1 0.0 - 0.5 K/uL   Basophils Relative 1 %   Basophils Absolute 0.1 0.0 - 0.1 K/uL   Immature Granulocytes 0 %   Abs Immature Granulocytes 0.01 0.00 - 0.07 K/uL  Comprehensive metabolic panel  Result Value Ref Range   Sodium 139 135 - 145 mmol/L   Potassium 3.4 (L) 3.5 - 5.1 mmol/L   Chloride 106 98 - 111 mmol/L   CO2 23 22 - 32 mmol/L   Glucose, Bld 103 (H) 70 - 99 mg/dL   BUN 12 8 - 23 mg/dL   Creatinine, Ser 7.74 0.44 - 1.00 mg/dL   Calcium 8.4 (L) 8.9 - 10.3 mg/dL   Total Protein 6.5 6.5 - 8.1 g/dL   Albumin 3.4 (L) 3.5 - 5.0 g/dL   AST 20 15 - 41 U/L   ALT 11 0 - 44 U/L   Alkaline Phosphatase 54 38 - 126 U/L   Total Bilirubin 0.5 0.3 - 1.2 mg/dL   GFR, Estimated >12 >87 mL/min   Anion gap 10 5 - 15      Assessment & Plan:   Problem List Items Addressed This Visit       Cardiovascular and Mediastinum   Aortic atherosclerosis (HCC)    Will try to keep BP and cholesterol under good control. Continue to monitor.          Other   Opiate abuse, continuous (HCC)    Has come off her suboxone. No longer using it. She denies any cravings. Doing well.        Anxiety disorder    Not under good control. Recommended seeing [facility]- walk in hours discussed with patient today. She will go next week. Continue to monitor. Call with any concerns.        Relevant Orders   Ambulatory referral to Psychiatry   Benzodiazepine abuse [facility])    Referral to psychiatry made today. She will go to [facility] next week.        Relevant Orders   Ambulatory referral to Psychiatry   Alcohol abuse - Primary    Referral to psychiatry made today. She will go to [facility] next week.        Relevant Orders   Ambulatory referral to Psychiatry   Other Visit Diagnoses     Right wrist pain       Recommended voltaren. Call with  any concerns. Continue to monitor.         Follow up plan: Return in about 3 months (around 09/03/2021) for physical.   >30 minutes spent with patient today.

## 2021-06-06 ENCOUNTER — Encounter: Payer: Self-pay | Admitting: Family Medicine

## 2021-06-06 DIAGNOSIS — F1310 Sedative, hypnotic or anxiolytic abuse, uncomplicated: Secondary | ICD-10-CM | POA: Insufficient documentation

## 2021-06-06 DIAGNOSIS — F1010 Alcohol abuse, uncomplicated: Secondary | ICD-10-CM | POA: Insufficient documentation

## 2021-06-06 NOTE — Assessment & Plan Note (Signed)
Referral to psychiatry made today. She will go to [facility] next week.

## 2021-06-06 NOTE — Assessment & Plan Note (Signed)
Not under good control. Recommended seeing [facility]- walk in hours discussed with patient today. She will go next week. Continue to monitor. Call with any concerns.

## 2021-06-06 NOTE — Assessment & Plan Note (Signed)
Has come off her suboxone. No longer using it. She denies any cravings. Doing well.

## 2021-06-06 NOTE — Assessment & Plan Note (Signed)
Will try to keep BP and cholesterol under good control. Continue to monitor.  

## 2021-06-06 NOTE — Assessment & Plan Note (Signed)
Referral to psychiatry made today. She will go to [facility] next week.  

## 2021-07-20 ENCOUNTER — Telehealth: Payer: 59 | Admitting: General Practice

## 2021-07-20 NOTE — Telephone Encounter (Signed)
  Care Management   Follow Up Note   07/20/2021 Name: Vanessa Oneal MRN: [id number] DOB: 05-11-1951   Referred by: Dorcas Carrow, DO Reason for referral : Chronic Care Management (RNCM: Follow up for Chronic Disease Management and Care Coordination Needs)   An unsuccessful telephone outreach was attempted today. The patient was referred to the case management team for assistance with care management and care coordination. Unable to leave a voice mail for the patient. Will call back at another time.   Follow Up Plan: The care management team will reach out to the patient again over the next 7 to 30  days.   Alto Denver RN, MSN, CCM Community Care Coordinator [place]  [facility] Mobile: [phone]

## 2021-07-21 DIAGNOSIS — Z1231 Encounter for screening mammogram for malignant neoplasm of breast: Secondary | ICD-10-CM

## 2021-07-27 ENCOUNTER — Telehealth: Payer: Self-pay | Admitting: General Practice

## 2021-07-27 ENCOUNTER — Telehealth: Payer: 59

## 2021-07-27 NOTE — Telephone Encounter (Signed)
  Care Management   Follow Up Note   07/27/2021 Name: Vanessa Oneal MRN: [id number] DOB: 01-20-1951   Referred by: Dorcas Carrow, DO Reason for referral : Chronic Care Management (RNCM: Follow up for Chronic Disease Management and Care Coordination Needs )   A second unsuccessful telephone outreach was attempted today. The patient was referred to the case management team for assistance with care management and care coordination.   Follow Up Plan: The care management team will reach out to the patient again over the next 30 days.   Alto Denver RN, MSN, CCM Community Care Coordinator [place]  [facility] Mobile: [phone]

## 2021-07-28 ENCOUNTER — Other Ambulatory Visit: Payer: Self-pay | Admitting: Family Medicine

## 2021-07-28 NOTE — Telephone Encounter (Signed)
Requested medication (s) are due for refill today: Yes  Requested medication (s) are on the active medication list: Yes  Last refill:  06/03/21  Future visit scheduled: Yes  Notes to clinic:  See request.    Requested Prescriptions  Pending Prescriptions Disp Refills   promethazine (PHENERGAN) 25 MG tablet [Pharmacy Med Name: promethazine 25 mg tablet] 30 tablet 1    Sig: TAKE 1/2 TABLET BY MOUTH EVERY 8 HOURS AS NEEDED FOR NAUSEA AND VOMITING     Not Delegated - Gastroenterology: Antiemetics Failed - 07/28/2021  3:23 PM      Failed - This refill cannot be delegated      Passed - Valid encounter within last 6 months    Recent Outpatient Visits           1 month ago Alcohol abuse   [facility] [place], Megan P, DO   6 months ago Centrilobular emphysema (HCC)   [facility] [place], [place] T, NP   7 months ago Leg cramps   [facility] [place], [place], DO   11 months ago Facial cellulitis   [facility] [place], Megan P, DO   11 months ago Suspected COVID-19 virus infection   [facility], [place], DO       Future Appointments             In 1 month [place], Megan P, DO [facility], [facility]   In 6 months  [facility], [facility]

## 2021-08-02 ENCOUNTER — Telehealth: Payer: Self-pay

## 2021-08-02 ENCOUNTER — Telehealth: Payer: 59

## 2021-08-02 NOTE — Telephone Encounter (Signed)
  Care Management   Follow Up Note   08/02/2021 Name: Vanessa Oneal MRN: [id number] DOB: [birth year]   Referred by: Dorcas Carrow, DO Reason for referral : Chronic Care Management (RNCM; 3rd attempt to Re-engage the patient in CCM services. )   Third unsuccessful telephone outreach was attempted today. The patient was referred to the case management team for assistance with care management and care coordination. The patient's primary care provider has been notified of our unsuccessful attempts to make or maintain contact with the patient. The care management team is pleased to engage with this patient at any time in the future should he/she be interested in assistance from the care management team.   Follow Up Plan: We have been unable to make contact with the patient for follow up. The care management team is available to follow up with the patient after provider conversation with the patient regarding recommendation for care management engagement and subsequent re-referral to the care management team.   Alto Denver RN, MSN, CCM Community Care Coordinator [place]  [facility] Mobile: [phone]

## 2021-09-15 ENCOUNTER — Encounter: Payer: 59 | Admitting: Family Medicine

## 2021-10-14 ENCOUNTER — Other Ambulatory Visit: Payer: Self-pay | Admitting: Nurse Practitioner

## 2021-10-15 NOTE — Telephone Encounter (Signed)
Requested medications are due for refill today Just filled 10/14/21  Requested medications are on the active medication list yes  Last refill 10/14/21  Last visit 06/03/21  Future visit scheduled 01/30/22  Notes to clinic This medication can not be delegated, just filled 11/11, please assess.   Requested Prescriptions  Pending Prescriptions Disp Refills   promethazine (PHENERGAN) 25 MG tablet [Pharmacy Med Name: promethazine 25 mg tablet] 30 tablet 1    Sig: TAKE 1/2 TABLET BY MOUTH EVERY 8 HOURS AS NEEDED FOR NAUSEA AND VOMITING     Not Delegated - Gastroenterology: Antiemetics Failed - 10/14/2021  3:21 PM      Failed - This refill cannot be delegated      Passed - Valid encounter within last 6 months    Recent Outpatient Visits           4 months ago Alcohol abuse   [facility] [place], Megan P, DO   9 months ago Centrilobular emphysema (HCC)   [facility] [place], [place] T, NP   9 months ago Leg cramps   [facility] [place], [place], DO   1 year ago Facial cellulitis   [facility] [place], Megan P, DO   1 year ago Suspected COVID-19 virus infection   [facility] [place], [place], DO       Future Appointments             In 3 months [facility]

## 2021-11-08 ENCOUNTER — Telehealth: Payer: Self-pay

## 2021-11-08 NOTE — Telephone Encounter (Signed)
Spoke with patient about her Mammogram, patient will call today and set up appointment. Phone number to [facility] was given:[phone])

## 2021-11-14 ENCOUNTER — Other Ambulatory Visit: Payer: Self-pay

## 2021-11-14 ENCOUNTER — Ambulatory Visit (INDEPENDENT_AMBULATORY_CARE_PROVIDER_SITE_OTHER): Payer: 59 | Admitting: Family Medicine

## 2021-11-14 ENCOUNTER — Encounter: Payer: Self-pay | Admitting: Family Medicine

## 2021-11-14 VITALS — BP 111/68 | HR 77 | Temp 97.8°F | Wt 88.8 lb

## 2021-11-14 DIAGNOSIS — S32030A Wedge compression fracture of third lumbar vertebra, initial encounter for closed fracture: Secondary | ICD-10-CM

## 2021-11-14 DIAGNOSIS — Z13820 Encounter for screening for osteoporosis: Secondary | ICD-10-CM | POA: Diagnosis not present

## 2021-11-14 DIAGNOSIS — Z23 Encounter for immunization: Secondary | ICD-10-CM | POA: Diagnosis not present

## 2021-11-14 DIAGNOSIS — I700 Atherosclerosis of aorta: Secondary | ICD-10-CM

## 2021-11-14 DIAGNOSIS — E559 Vitamin D deficiency, unspecified: Secondary | ICD-10-CM | POA: Diagnosis not present

## 2021-11-14 DIAGNOSIS — B182 Chronic viral hepatitis C: Secondary | ICD-10-CM

## 2021-11-14 DIAGNOSIS — S41101A Unspecified open wound of right upper arm, initial encounter: Secondary | ICD-10-CM | POA: Diagnosis not present

## 2021-11-14 DIAGNOSIS — R300 Dysuria: Secondary | ICD-10-CM

## 2021-11-14 DIAGNOSIS — E440 Moderate protein-calorie malnutrition: Secondary | ICD-10-CM

## 2021-11-14 DIAGNOSIS — M25531 Pain in right wrist: Secondary | ICD-10-CM | POA: Diagnosis not present

## 2021-11-14 DIAGNOSIS — J432 Centrilobular emphysema: Secondary | ICD-10-CM

## 2021-11-14 NOTE — Progress Notes (Signed)
BP 111/68   Pulse 77   Temp 97.8 F (36.6 C)   Wt 88 lb 12.8 oz (40.3 kg)   SpO2 95%   BMI 16.78 kg/m    Subjective:    Patient ID: Vanessa Oneal, female    DOB: 01-28-1951, 70 y.o.   MRN: [id number]  HPI: Vanessa Oneal is a 70 y.o. female  Chief Complaint  Patient presents with   Fall    Patient fell on November 23 and hurt her back and wrist. Patient right wrist is still swollen and continues to have back pain.    BACK PAIN Duration: about 3 weeks Mechanism of injury:  fall Location: midline and low back Onset: sudden Severity: severe Quality: aching and sore and sharp Frequency: constant Radiation: none Aggravating factors: movement Alleviating factors: nothing Status: stable Treatments attempted: rest, ice and heat Relief with NSAIDs?: no Nighttime pain:  yes Paresthesias / decreased sensation:  no Bowel / bladder incontinence:  no Fevers:  no Dysuria / urinary frequency:  no  COPD COPD status: controlled Satisfied with current treatment?: yes Oxygen use: no Dyspnea frequency: occasionally Cough frequency:  occasionally Rescue inhaler frequency:   occasionally Limitation of activity: no Pneumovax: Up to Date Influenza: Up to Date  HEPATITIS C Duration since diagnosis:  years Hepatology evaluation:yes Liver biopsy:no  Cirrhosis: no Antiviral therapy:yes Hepatocellular carcinoma screening: no Esophageal varices screening/EGD: no Hepatitis A Vaccine: unknown Hepatitis B Vaccine: unknown Pneumovax Vaccine: Up to Date   Relevant past medical, surgical, family and social history reviewed and updated as indicated. Interim medical history since our last visit reviewed. Allergies and medications reviewed and updated.  Review of Systems  Constitutional: Negative.   Respiratory: Negative.    Cardiovascular: Negative.   Gastrointestinal: Negative.   Musculoskeletal:  Positive for arthralgias and back pain. Negative for gait problem, joint  swelling, myalgias, neck pain and neck stiffness.  Skin: Negative.   Neurological: Negative.   Psychiatric/Behavioral: Negative.     Per HPI unless specifically indicated above     Objective:    BP 111/68   Pulse 77   Temp 97.8 F (36.6 C)   Wt 88 lb 12.8 oz (40.3 kg)   SpO2 95%   BMI 16.78 kg/m   Wt Readings from Last 3 Encounters:  11/14/21 88 lb 12.8 oz (40.3 kg)  06/03/21 79 lb 9.6 oz (36.1 kg)  03/26/21 100 lb (45.4 kg)    Physical Exam Vitals and nursing note reviewed.  Constitutional:      General: She is not in acute distress.    Appearance: Normal appearance. She is not ill-appearing, toxic-appearing or diaphoretic.  HENT:     Head: Normocephalic and atraumatic.     Right Ear: External ear normal.     Left Ear: External ear normal.     Nose: Nose normal.     Mouth/Throat:     Mouth: Mucous membranes are moist.     Pharynx: Oropharynx is clear.  Eyes:     General: No scleral icterus.       Right eye: No discharge.        Left eye: No discharge.     Extraocular Movements: Extraocular movements intact.     Conjunctiva/sclera: Conjunctivae normal.     Pupils: Pupils are equal, round, and reactive to light.  Cardiovascular:     Rate and Rhythm: Normal rate and regular rhythm.     Pulses: Normal pulses.     Heart sounds: Normal heart sounds.  No murmur heard.   No friction rub. No gallop.  Pulmonary:     Effort: Pulmonary effort is normal. No respiratory distress.     Breath sounds: Normal breath sounds. No stridor. No wheezing, rhonchi or rales.  Chest:     Chest wall: No tenderness.  Musculoskeletal:        General: Tenderness (point tenderness around L3) present. Normal range of motion.     Cervical back: Normal range of motion and neck supple.  Skin:    General: Skin is warm and dry.     Capillary Refill: Capillary refill takes less than 2 seconds.     Coloration: Skin is not jaundiced or pale.     Findings: No bruising, erythema, lesion or rash.      Comments: Small well healing wound on R arm  Neurological:     General: No focal deficit present.     Mental Status: She is alert and oriented to person, place, and time. Mental status is at baseline.  Psychiatric:        Mood and Affect: Mood normal.        Behavior: Behavior normal.        Thought Content: Thought content normal.        Judgment: Judgment normal.    Results for orders placed or performed in visit on 11/14/21  Comprehensive metabolic panel  Result Value Ref Range   Glucose 105 (H) 70 - 99 mg/dL   BUN 21 8 - 27 mg/dL   Creatinine, Ser 0.65 0.57 - 1.00 mg/dL   eGFR 95 >59 mL/min/1.73   BUN/Creatinine Ratio 32 (H) 12 - 28   Sodium 140 134 - 144 mmol/L   Potassium 4.6 3.5 - 5.2 mmol/L   Chloride 102 96 - 106 mmol/L   CO2 23 20 - 29 mmol/L   Calcium 9.1 8.7 - 10.3 mg/dL   Total Protein 6.5 6.0 - 8.5 g/dL   Albumin 4.2 3.8 - 4.8 g/dL   Globulin, Total 2.3 1.5 - 4.5 g/dL   Albumin/Globulin Ratio 1.8 1.2 - 2.2   Bilirubin Total 0.3 0.0 - 1.2 mg/dL   Alkaline Phosphatase 92 44 - 121 IU/L   AST 27 0 - 40 IU/L   ALT 12 0 - 32 IU/L  CBC with Differential/Platelet  Result Value Ref Range   WBC 5.4 3.4 - 10.8 x10E3/uL   RBC 3.69 (L) 3.77 - 5.28 x10E6/uL   Hemoglobin 12.3 11.1 - 15.9 g/dL   Hematocrit 36.1 34.0 - 46.6 %   MCV 98 (H) 79 - 97 fL   MCH 33.3 (H) 26.6 - 33.0 pg   MCHC 34.1 31.5 - 35.7 g/dL   RDW 12.8 11.7 - 15.4 %   Platelets 202 150 - 450 x10E3/uL   Neutrophils 37 Not Estab. %   Lymphs 30 Not Estab. %   Monocytes 11 Not Estab. %   Eos 21 Not Estab. %   Basos 1 Not Estab. %   Neutrophils Absolute 2.0 1.4 - 7.0 x10E3/uL   Lymphocytes Absolute 1.6 0.7 - 3.1 x10E3/uL   Monocytes Absolute 0.6 0.1 - 0.9 x10E3/uL   EOS (ABSOLUTE) 1.1 (H) 0.0 - 0.4 x10E3/uL   Basophils Absolute 0.1 0.0 - 0.2 x10E3/uL   Immature Granulocytes 0 Not Estab. %   Immature Grans (Abs) 0.0 0.0 - 0.1 x10E3/uL   Hematology Comments: Note:   Lipid Panel w/o Chol/HDL Ratio  Result  Value Ref Range   Cholesterol, Total 141 100 - 199  mg/dL   Triglycerides 65 0 - 149 mg/dL   HDL 74 >39 mg/dL   VLDL Cholesterol Cal 13 5 - 40 mg/dL   LDL Chol Calc (NIH) 54 0 - 99 mg/dL  TSH  Result Value Ref Range   TSH 2.720 0.450 - 4.500 uIU/mL  VITAMIN D 25 Hydroxy (Vit-D Deficiency, Fractures)  Result Value Ref Range   Vit D, 25-Hydroxy 27.7 (L) 30.0 - 100.0 ng/mL  Urinalysis, Routine w reflex microscopic  Result Value Ref Range   Specific Gravity, UA >1.030 (H) 1.005 - 1.030   pH, UA 5.5 5.0 - 7.5   Color, UA Yellow Yellow   Appearance Ur Clear Clear   Leukocytes,UA Negative Negative   Protein,UA Negative Negative/Trace   Glucose, UA Negative Negative   Ketones, UA Negative Negative   RBC, UA Negative Negative   Bilirubin, UA Negative Negative   Urobilinogen, Ur 0.2 0.2 - 1.0 mg/dL   Nitrite, UA Negative Negative  HCV RNA quant  Result Value Ref Range   Hepatitis C Quantitation WILL FOLLOW    HCV log10 WILL FOLLOW    Test Information Comment       Assessment & Plan:   Problem List Items Addressed This Visit       Cardiovascular and Mediastinum   Aortic atherosclerosis (HCC)    Will check labs and keep BP and cholesterol under good control. Continue to monitor. Call with any concerns.       Relevant Orders   Comprehensive metabolic panel (Completed)   CBC with Differential/Platelet (Completed)   Lipid Panel w/o Chol/HDL Ratio (Completed)   TSH (Completed)     Respiratory   Centrilobular emphysema (HCC)    Under good control on current regimen. Continue current regimen. Continue to monitor. Call with any concerns. Refills given.        Relevant Orders   Comprehensive metabolic panel (Completed)   CBC with Differential/Platelet (Completed)   Lipid Panel w/o Chol/HDL Ratio (Completed)   TSH (Completed)     Digestive   Chronic hepatitis C without hepatic coma ([place])    Rechecking labs today. Await results. Treat as needed.       Relevant Orders   HCV  RNA quant (Completed)     Musculoskeletal and Integument   Compression fracture of third lumbar vertebra ([place]) - Primary    Will get CT scan and get her into ortho. Referral generated today. Await their input.       Relevant Orders   CT Lumbar Spine Wo Contrast   Ambulatory referral to Orthopedic Surgery   DG Bone Density     Other   Malnutrition ([place])    Stable. Weight up a bit. Continue diet and exercise. Call with any concerns.       Vitamin D deficiency, unspecified    Rechecking labs today. Await results. Treat as needed.       Relevant Orders   Comprehensive metabolic panel (Completed)   CBC with Differential/Platelet (Completed)   TSH (Completed)   VITAMIN D 25 Hydroxy (Vit-D Deficiency, Fractures) (Completed)   Other Visit Diagnoses     Right wrist pain       Will get x-ray. Await results. Treat as needed.    Relevant Orders   DG Wrist Complete Right   Screening for osteoporosis       DEXA ordered today. Await results. Treat as needed.    Relevant Orders   DG Bone Density   Dysuria  Will check urine. Await results.    Relevant Orders   Urinalysis, Routine w reflex microscopic (Completed)   Open wound of right upper arm, initial encounter       Td given today.   Relevant Orders   Td : Tetanus/diphtheria >7yo Preservative  free (Completed)   Needs flu shot       Flu shot given today.   Relevant Orders   Flu Vaccine QUAD High Dose(Fluad) (Completed)   Comprehensive metabolic panel (Completed)   CBC with Differential/Platelet (Completed)        Follow up plan: Return in about 2 months (around 01/15/2022).

## 2021-11-15 ENCOUNTER — Encounter: Payer: Self-pay | Admitting: Family Medicine

## 2021-11-15 DIAGNOSIS — S32030A Wedge compression fracture of third lumbar vertebra, initial encounter for closed fracture: Secondary | ICD-10-CM | POA: Insufficient documentation

## 2021-11-15 LAB — URINALYSIS, ROUTINE W REFLEX MICROSCOPIC
Bilirubin, UA: NEGATIVE
Glucose, UA: NEGATIVE
Ketones, UA: NEGATIVE
Leukocytes,UA: NEGATIVE
Nitrite, UA: NEGATIVE
Protein,UA: NEGATIVE
RBC, UA: NEGATIVE
Specific Gravity, UA: >1.03 — ABNORMAL HIGH (ref 1.005–1.030)
Urobilinogen, Ur: 0.2 mg/dL (ref 0.2–1.0)
pH, UA: 5.5 (ref 5.0–7.5)

## 2021-11-15 NOTE — Assessment & Plan Note (Signed)
Rechecking labs today. Await results. Treat as needed.  °

## 2021-11-15 NOTE — Assessment & Plan Note (Signed)
Will check labs and keep BP and cholesterol under good control. Continue to monitor. Call with any concerns.

## 2021-11-15 NOTE — Assessment & Plan Note (Signed)
Under good control on current regimen. Continue current regimen. Continue to monitor. Call with any concerns. Refills given.   

## 2021-11-15 NOTE — Assessment & Plan Note (Signed)
Stable. Weight up a bit. Continue diet and exercise. Call with any concerns.

## 2021-11-15 NOTE — Assessment & Plan Note (Signed)
Will get CT scan and get her into ortho. Referral generated today. Await their input.

## 2021-11-16 LAB — LIPID PANEL W/O CHOL/HDL RATIO
Cholesterol, Total: 141 mg/dL (ref 100–199)
HDL: 74 mg/dL (ref >39)
LDL Chol Calc (NIH): 54 mg/dL (ref 0–99)
Triglycerides: 65 mg/dL (ref 0–149)
VLDL Cholesterol Cal: 13 mg/dL (ref 5–40)

## 2021-11-16 LAB — CBC WITH DIFFERENTIAL/PLATELET
Basophils Absolute: 0.1 10*3/uL (ref 0.0–0.2)
Basos: 1 %
EOS (ABSOLUTE): 1.1 10*3/uL — ABNORMAL HIGH (ref 0.0–0.4)
Eos: 21 %
Hematocrit: 36.1 % (ref 34.0–46.6)
Hemoglobin: 12.3 g/dL (ref 11.1–15.9)
Immature Grans (Abs): 0 10*3/uL (ref 0.0–0.1)
Immature Granulocytes: 0 %
Lymphocytes Absolute: 1.6 10*3/uL (ref 0.7–3.1)
Lymphs: 30 %
MCH: 33.3 pg — ABNORMAL HIGH (ref 26.6–33.0)
MCHC: 34.1 g/dL (ref 31.5–35.7)
MCV: 98 fL — ABNORMAL HIGH (ref 79–97)
Monocytes Absolute: 0.6 10*3/uL (ref 0.1–0.9)
Monocytes: 11 %
Neutrophils Absolute: 2 10*3/uL (ref 1.4–7.0)
Neutrophils: 37 %
Platelets: 202 10*3/uL (ref 150–450)
RBC: 3.69 x10E6/uL — ABNORMAL LOW (ref 3.77–5.28)
RDW: 12.8 % (ref 11.7–15.4)
WBC: 5.4 10*3/uL (ref 3.4–10.8)

## 2021-11-16 LAB — COMPREHENSIVE METABOLIC PANEL
ALT: 12 IU/L (ref 0–32)
AST: 27 IU/L (ref 0–40)
Albumin/Globulin Ratio: 1.8 (ref 1.2–2.2)
Albumin: 4.2 g/dL (ref 3.8–4.8)
Alkaline Phosphatase: 92 IU/L (ref 44–121)
BUN/Creatinine Ratio: 32 — ABNORMAL HIGH (ref 12–28)
BUN: 21 mg/dL (ref 8–27)
Bilirubin Total: 0.3 mg/dL (ref 0.0–1.2)
CO2: 23 mmol/L (ref 20–29)
Calcium: 9.1 mg/dL (ref 8.7–10.3)
Chloride: 102 mmol/L (ref 96–106)
Creatinine, Ser: 0.65 mg/dL (ref 0.57–1.00)
Globulin, Total: 2.3 g/dL (ref 1.5–4.5)
Glucose: 105 mg/dL — ABNORMAL HIGH (ref 70–99)
Potassium: 4.6 mmol/L (ref 3.5–5.2)
Sodium: 140 mmol/L (ref 134–144)
Total Protein: 6.5 g/dL (ref 6.0–8.5)
eGFR: 95 mL/min/{1.73_m2} (ref >59)

## 2021-11-16 LAB — HCV RNA QUANT: Hepatitis C Quantitation: NOT DETECTED IU/mL

## 2021-11-16 LAB — VITAMIN D 25 HYDROXY (VIT D DEFICIENCY, FRACTURES): Vit D, 25-Hydroxy: 27.7 ng/mL — ABNORMAL LOW (ref 30.0–100.0)

## 2021-11-16 LAB — TSH: TSH: 2.72 u[IU]/mL (ref 0.450–4.500)

## 2021-11-18 ENCOUNTER — Ambulatory Visit: Payer: 59 | Attending: Family Medicine

## 2021-11-18 ENCOUNTER — Encounter: Payer: Self-pay | Admitting: Family Medicine

## 2021-12-02 ENCOUNTER — Ambulatory Visit: Payer: Self-pay

## 2021-12-02 ENCOUNTER — Ambulatory Visit: Payer: Self-pay | Admitting: *Deleted

## 2021-12-02 ENCOUNTER — Ambulatory Visit: Payer: Self-pay | Admitting: Family Medicine

## 2021-12-02 NOTE — Telephone Encounter (Signed)
Pt called, unable to leave VM d/t mailbox not being set up,   Summary: back discomfort / rx request   The patient experienced a fall at [employer] on 10/26/21   The patient shares that they're continuing to experience back discomfort   The patient has seen their PCP previously for their discomfort   The patient would like to be prescribed something for their discomfort   Please contact further

## 2021-12-02 NOTE — Telephone Encounter (Signed)
Reason for Disposition  [1] MODERATE back pain (e.g., interferes with normal activities) AND [2] present > 3 days  Answer Assessment - Initial Assessment Questions 1. ONSET: "When did the pain begin?"      11/03/21 2. LOCATION: "Where does it hurt?" (upper, mid or lower back)     Low back pain and leg 3. SEVERITY: "How bad is the pain?"  (e.g., Scale 1-10; mild, moderate, or severe)   - MILD (1-3): doesn't interfere with normal activities    - MODERATE (4-7): interferes with normal activities or awakens from sleep    - SEVERE (8-10): excruciating pain, unable to do any normal activities      8 4. PATTERN: "Is the pain constant?" (e.g., yes, no; constant, intermittent)      intermittant 5. RADIATION: "Does the pain shoot into your legs or elsewhere?"     Runs down leg 6. CAUSE:  "What do you think is causing the back pain?"      Got hurt at [employer] 7. BACK OVERUSE:  "Any recent lifting of heavy objects, strenuous work or exercise?"     na 8. MEDICATIONS: "What have you taken so far for the pain?" (e.g., nothing, acetaminophen, NSAIDS)     Over the counter 9. NEUROLOGIC SYMPTOMS: "Do you have any weakness, numbness, or problems with bowel/bladder control?"     no 10. OTHER SYMPTOMS: "Do you have any other symptoms?" (e.g., fever, abdominal pain, burning with urination, blood in urine)       na 11. PREGNANCY: "Is there any chance you are pregnant?" (e.g., yes, no; LMP)       na  Protocols used: Back Pain-A-AH Pt was really calling to talk about whether she should see an orthopedic, pain is with movement. Has been to ED, has seen Dr. Laural Benes. Ortho referral was offered earlier.

## 2021-12-02 NOTE — Telephone Encounter (Signed)
Error

## 2021-12-09 NOTE — Telephone Encounter (Signed)
Yes, I think seeing ortho would be a good idea

## 2021-12-09 NOTE — Telephone Encounter (Signed)
Attempted to contact the patient and unable to leave message as there was an error message "stating unable to leave message for patient and phone hung up."

## 2021-12-12 NOTE — Telephone Encounter (Signed)
Tried calling patient, no answer and no VM.  

## 2021-12-15 ENCOUNTER — Ambulatory Visit: Payer: 59 | Admitting: Family Medicine

## 2021-12-19 ENCOUNTER — Other Ambulatory Visit: Payer: Self-pay | Admitting: Family Medicine

## 2021-12-19 MED ORDER — PROMETHAZINE HCL 25 MG PO TABS: ORAL_TABLET | ORAL | 1 refills | Status: DC

## 2021-12-19 NOTE — Telephone Encounter (Signed)
Requested medication (s) are due for refill today:   Provider to review  Requested medication (s) are on the active medication list:   Yes   Future visit scheduled:   Yes   Last ordered: 07/28/2021 #30, 1 refill  Non delegated refill   Requested Prescriptions  Pending Prescriptions Disp Refills   promethazine (PHENERGAN) 25 MG tablet 30 tablet 1     Not Delegated - Gastroenterology: Antiemetics Failed - 12/19/2021  1:34 PM      Failed - This refill cannot be delegated      Passed - Valid encounter within last 6 months    Recent Outpatient Visits           1 month ago Compression fracture of L3 vertebra, initial encounter [facility])   [facility] [place], Megan P, DO   6 months ago Alcohol abuse   [facility], [place], DO   11 months ago Centrilobular emphysema (HCC)   [facility] [place], [place] T, NP   12 months ago Leg cramps   [facility] [place], [place], DO   1 year ago Facial cellulitis   [facility] Dorcas Carrow, DO       Future Appointments             In 1 week Laural Benes, Oralia Rud, DO [facility]   In 1 month  [facility], [facility]

## 2021-12-19 NOTE — Telephone Encounter (Signed)
Medication Refill - Medication: promethazine (PHENERGAN) 25 MG tablet  Has the patient contacted their pharmacy? Yes.   (Agent: If no, request that the patient contact the pharmacy for the refill. If patient does not wish to contact the pharmacy document the reason why and proceed with request.) (Agent: If yes, when and what did the pharmacy advise?)Pharmacy advised pt that they called the office about this RX   Preferred Pharmacy (with phone number or street name): [place]  Has the patient been seen for an appointment in the last year OR does the patient have an upcoming appointment? Yes.    Agent: Please be advised that RX refills may take up to 3 business days. We ask that you follow-up with your pharmacy.

## 2021-12-21 ENCOUNTER — Telehealth: Payer: Self-pay | Admitting: Family Medicine

## 2021-12-21 ENCOUNTER — Other Ambulatory Visit: Payer: Self-pay

## 2021-12-21 NOTE — Telephone Encounter (Signed)
Pharmacy did not receive rx, please resend.

## 2021-12-21 NOTE — Telephone Encounter (Signed)
Spoke to [facility], tech states they did not receive rx. Will start medication refill and send to provider.

## 2021-12-21 NOTE — Telephone Encounter (Signed)
Pt called in for assistance. Pt called in to check the status of her Rx for promethazine (PHENERGAN) 25 MG tablet . Pt was told by her pharmacy that the Rx wasn't sent in.   Pt would like to know if provider could follow up with pharmacy.   Please assist pt further.

## 2021-12-26 ENCOUNTER — Other Ambulatory Visit: Payer: Self-pay | Admitting: Nurse Practitioner

## 2021-12-27 ENCOUNTER — Other Ambulatory Visit: Payer: Self-pay | Admitting: Family Medicine

## 2021-12-27 NOTE — Telephone Encounter (Signed)
Needs follow up. Unclear if Pharmacy received.

## 2021-12-27 NOTE — Telephone Encounter (Signed)
Requested medication (s) are due for refill today:   Provider to review  Requested medication (s) are on the active medication list:   Yes  Future visit scheduled:   Yes   Last ordered: 12/19/2021 #30, 1 refill  Returned because it's a non delegated refill per protocol.   Requested Prescriptions  Pending Prescriptions Disp Refills   promethazine (PHENERGAN) 25 MG tablet [Pharmacy Med Name: promethazine 25 mg tablet] 30 tablet 1    Sig: TAKE 1/2 TABLET BY MOUTH EVERY 8 HOURS AS NEEDED FOR NAUSEA AND VOMITING     Not Delegated - Gastroenterology: Antiemetics Failed - 12/26/2021  1:38 PM      Failed - This refill cannot be delegated      Passed - Valid encounter within last 6 months    Recent Outpatient Visits           1 month ago Compression fracture of L3 vertebra, initial encounter [facility])   [facility] [place], Megan P, DO   6 months ago Alcohol abuse   [facility], [place], DO   11 months ago Centrilobular emphysema (HCC)   [facility] [place], [place] T, NP   1 year ago Leg cramps   [facility] [place], [place], DO   1 year ago Facial cellulitis   [facility] Dorcas Carrow, DO       Future Appointments             In 3 days Laural Benes, Oralia Rud, DO [facility]   In 1 month  [facility], [facility]

## 2021-12-28 NOTE — Telephone Encounter (Signed)
Requested medication (s) are due for refill today:   Provider to review  Requested medication (s) are on the active medication list:   Yes  Future visit scheduled:   Yes in 2 days with Laural Benes   Last ordered: 12/19/2021 #30, 1 refill  Returned because it's a non delegated refill.   Requested Prescriptions  Pending Prescriptions Disp Refills   promethazine (PHENERGAN) 25 MG tablet [Pharmacy Med Name: promethazine 25 mg tablet] 30 tablet 1    Sig: TAKE 1/2 TABLET BY MOUTH EVERY 8 HOURS AS NEEDED FOR NAUSEA AND VOMITING     Not Delegated - Gastroenterology: Antiemetics Failed - 12/27/2021  1:57 PM      Failed - This refill cannot be delegated      Passed - Valid encounter within last 6 months    Recent Outpatient Visits           1 month ago Compression fracture of L3 vertebra, initial encounter [facility])   [facility] [place], Megan P, DO   6 months ago Alcohol abuse   [facility], [place], DO   11 months ago Centrilobular emphysema (HCC)   [facility] [place], [place] T, NP   1 year ago Leg cramps   [facility] [place], [place], DO   1 year ago Facial cellulitis   [facility] Dorcas Carrow, DO       Future Appointments             In 2 days Laural Benes, Oralia Rud, DO [facility]   In 1 month  [facility], [facility]

## 2021-12-28 NOTE — Telephone Encounter (Signed)
Previous prescription did not go through to pharmacy

## 2021-12-30 ENCOUNTER — Ambulatory Visit (INDEPENDENT_AMBULATORY_CARE_PROVIDER_SITE_OTHER): Payer: 59 | Admitting: Family Medicine

## 2021-12-30 ENCOUNTER — Encounter: Payer: Self-pay | Admitting: Family Medicine

## 2021-12-30 ENCOUNTER — Other Ambulatory Visit: Payer: Self-pay

## 2021-12-30 VITALS — BP 123/76 | HR 63 | Temp 98.0°F | Ht 60.0 in | Wt 86.8 lb

## 2021-12-30 DIAGNOSIS — S32030A Wedge compression fracture of third lumbar vertebra, initial encounter for closed fracture: Secondary | ICD-10-CM | POA: Diagnosis not present

## 2021-12-30 DIAGNOSIS — Z1211 Encounter for screening for malignant neoplasm of colon: Secondary | ICD-10-CM

## 2021-12-30 DIAGNOSIS — Z0000 Encounter for general adult medical examination without abnormal findings: Secondary | ICD-10-CM | POA: Diagnosis not present

## 2021-12-30 DIAGNOSIS — H538 Other visual disturbances: Secondary | ICD-10-CM

## 2021-12-30 NOTE — Progress Notes (Signed)
BP 123/76    Pulse 63    Temp 98 F (36.7 C)    Ht 5' (1.524 m)    Wt 86 lb 12.8 oz (39.4 kg)    SpO2 94%    BMI 16.95 kg/m    Subjective:    Patient ID: Vanessa Oneal, female    DOB: [birth year], 71 y.o.   MRN: [id number]  HPI: Vanessa Oneal is a 71 y.o. female presenting on 12/30/2021 for comprehensive medical examination. Current medical complaints include: Still has a lot of back and wrist pain. Has not got imaging done. Seeing ortho next week.  Menopausal Symptoms: no  Depression Screen done today and results listed below:  Depression screen [facility] 2/9 12/30/2021 06/03/2021 01/28/2021 01/07/2021 03/10/2020  Decreased Interest '2 3 3 ' 0 0  Down, Depressed, Hopeless '3 3 3 1 2  ' PHQ - 2 Score '5 6 6 1 2  ' Altered sleeping 3 3 0 1 1  Tired, decreased energy '3 3 2 ' 0 1  Change in appetite 3 3 0 0 1  Feeling bad or failure about yourself  0 0 0 0 0  Trouble concentrating 2 3 0 0 2  Moving slowly or fidgety/restless 2 0 0 0 0  Suicidal thoughts 0 0 0 0 0  PHQ-9 Score '18 18 8 2 7  ' Difficult doing work/chores - Not difficult at all Somewhat difficult - Somewhat difficult  Some recent data might be hidden   Past Medical History:  Past Medical History:  Diagnosis Date   Arthritis    RA   Depression    Fibromyalgia    GERD (gastroesophageal reflux disease)    H/O drug dependence (HCC)    opiods   Wears dentures    full upper    Surgical History:  Past Surgical History:  Procedure Laterality Date   BREAST SURGERY     Silicone implants, then removal   CATARACT EXTRACTION W/PHACO Left 05/06/2019   Procedure: CATARACT EXTRACTION PHACO AND INTRAOCULAR LENS PLACEMENT ([place]) LEFT;  Surgeon: Birder Robson, MD;  Location: [place];  Service: Ophthalmology;  Laterality: Left;   TUBAL LIGATION      Medications:  Current Outpatient Medications on File Prior to Visit  Medication Sig   albuterol (VENTOLIN HFA) 108 (90 Base) MCG/ACT inhaler Inhale 2 puffs into the lungs every 6  (six) hours as needed for wheezing or shortness of breath.   diclofenac Sodium (VOLTAREN) 1 % GEL Apply 4 g topically 4 (four) times daily.   promethazine (PHENERGAN) 25 MG tablet TAKE 1/2 TABLET BY MOUTH EVERY 8 HOURS AS NEEDED FOR NAUSEA AND VOMITING   No current facility-administered medications on file prior to visit.    Allergies:  Allergies  Allergen Reactions   Gabapentin Anaphylaxis    Social History:  Social History   Socioeconomic History   Marital status: Widowed    Spouse name: Not on file   Number of children: Not on file   Years of education: Not on file   Highest education level: Not on file  Occupational History   Occupation: retired  Tobacco Use   Smoking status: Light Smoker    Packs/day: 0.25    Years: 56.00    Pack years: 14.00    Types: Cigarettes    Last attempt to quit: 11/04/2019    Years since quitting: 2.1   Smokeless tobacco: Never   Tobacco comments:    1 pack with last a couple days   Vaping Use  Vaping Use: Former  Substance and Sexual Activity   Alcohol use: Not Currently   Drug use: No   Sexual activity: Not Currently  Other Topics Concern   Not on file  Social History Narrative   ** Merged History Encounter **       Social Determinants of Health   Financial Resource Strain: Low Risk    Difficulty of Paying Living Expenses: Not hard at all  Food Insecurity: No Food Insecurity   Worried About Charity fundraiser in the Last Year: Never true   Arboriculturist in the Last Year: Never true  Transportation Needs: No Transportation Needs   Lack of Transportation (Medical): No   Lack of Transportation (Non-Medical): No  Physical Activity: Inactive   Days of Exercise per Week: 0 days   Minutes of Exercise per Session: 0 min  Stress: Stress Concern Present   Feeling of Stress : To some extent  Social Connections: Not on file  Intimate Partner Violence: Not on file   Social History   Tobacco Use  Smoking Status Light Smoker    Packs/day: 0.25   Years: 56.00   Pack years: 14.00   Types: Cigarettes   Last attempt to quit: 11/04/2019   Years since quitting: 2.1  Smokeless Tobacco Never  Tobacco Comments   1 pack with last a couple days    Social History   Substance and Sexual Activity  Alcohol Use Not Currently    Family History:  Family History  Problem Relation Age of Onset   Suicidality Father    COPD Sister     Past medical history, surgical history, medications, allergies, family history and social history reviewed with patient today and changes made to appropriate areas of the chart.   Review of Systems  Constitutional: Negative.   HENT: Negative.    Eyes:  Positive for blurred vision. Negative for double vision, photophobia, pain, discharge and redness.  Respiratory: Negative.    Cardiovascular: Negative.   Gastrointestinal:  Positive for nausea. Negative for abdominal pain, blood in stool, constipation, diarrhea, heartburn, melena and vomiting.  Genitourinary: Negative.   Musculoskeletal:  Positive for back pain, joint pain and myalgias. Negative for falls and neck pain.  Skin: Negative.   Neurological:  Positive for tingling. Negative for dizziness, tremors, sensory change, speech change, focal weakness, seizures, loss of consciousness, weakness and headaches.  Endo/Heme/Allergies: Negative.   Psychiatric/Behavioral: Negative.    All other ROS negative except what is listed above and in the HPI.      Objective:    BP 123/76    Pulse 63    Temp 98 F (36.7 C)    Ht 5' (1.524 m)    Wt 86 lb 12.8 oz (39.4 kg)    SpO2 94%    BMI 16.95 kg/m   Wt Readings from Last 3 Encounters:  12/30/21 86 lb 12.8 oz (39.4 kg)  11/14/21 88 lb 12.8 oz (40.3 kg)  06/03/21 79 lb 9.6 oz (36.1 kg)    Physical Exam Vitals and nursing note reviewed.  Constitutional:      General: She is not in acute distress.    Appearance: Normal appearance. She is not ill-appearing, toxic-appearing or diaphoretic.  HENT:      Head: Normocephalic and atraumatic.     Right Ear: Tympanic membrane, ear canal and external ear normal. There is no impacted cerumen.     Left Ear: Tympanic membrane, ear canal and external ear normal. There is no  impacted cerumen.     Nose: Nose normal. No congestion or rhinorrhea.     Mouth/Throat:     Mouth: Mucous membranes are moist.     Pharynx: Oropharynx is clear. No oropharyngeal exudate or posterior oropharyngeal erythema.  Eyes:     General: No scleral icterus.       Right eye: No discharge.        Left eye: No discharge.     Extraocular Movements: Extraocular movements intact.     Conjunctiva/sclera: Conjunctivae normal.     Pupils: Pupils are equal, round, and reactive to light.  Neck:     Vascular: No carotid bruit.  Cardiovascular:     Rate and Rhythm: Normal rate and regular rhythm.     Pulses: Normal pulses.     Heart sounds: No murmur heard.   No friction rub. No gallop.  Pulmonary:     Effort: Pulmonary effort is normal. No respiratory distress.     Breath sounds: Normal breath sounds. No stridor. No wheezing, rhonchi or rales.  Chest:     Chest wall: No tenderness.  Abdominal:     General: Abdomen is flat. Bowel sounds are normal. There is no distension.     Palpations: Abdomen is soft. There is no mass.     Tenderness: There is no abdominal tenderness. There is no right CVA tenderness, left CVA tenderness, guarding or rebound.     Hernia: No hernia is present.  Genitourinary:    Comments: Breast and pelvic exams deferred with shared decision making Musculoskeletal:        General: No swelling, tenderness, deformity or signs of injury.     Cervical back: Normal range of motion and neck supple. No rigidity. No muscular tenderness.     Right lower leg: No edema.     Left lower leg: No edema.  Lymphadenopathy:     Cervical: No cervical adenopathy.  Skin:    General: Skin is warm and dry.     Capillary Refill: Capillary refill takes less than 2 seconds.      Coloration: Skin is not jaundiced or pale.     Findings: No bruising, erythema, lesion or rash.  Neurological:     General: No focal deficit present.     Mental Status: She is alert and oriented to person, place, and time. Mental status is at baseline.     Cranial Nerves: No cranial nerve deficit.     Sensory: No sensory deficit.     Motor: No weakness.     Coordination: Coordination normal.     Gait: Gait normal.     Deep Tendon Reflexes: Reflexes normal.  Psychiatric:        Mood and Affect: Mood normal.        Behavior: Behavior normal.        Thought Content: Thought content normal.        Judgment: Judgment normal.    Results for orders placed or performed in visit on 11/14/21  Comprehensive metabolic panel  Result Value Ref Range   Glucose 105 (H) 70 - 99 mg/dL   BUN 21 8 - 27 mg/dL   Creatinine, Ser 0.65 0.57 - 1.00 mg/dL   eGFR 95 >59 mL/min/1.73   BUN/Creatinine Ratio 32 (H) 12 - 28   Sodium 140 134 - 144 mmol/L   Potassium 4.6 3.5 - 5.2 mmol/L   Chloride 102 96 - 106 mmol/L   CO2 23 20 - 29 mmol/L   Calcium 9.1  8.7 - 10.3 mg/dL   Total Protein 6.5 6.0 - 8.5 g/dL   Albumin 4.2 3.8 - 4.8 g/dL   Globulin, Total 2.3 1.5 - 4.5 g/dL   Albumin/Globulin Ratio 1.8 1.2 - 2.2   Bilirubin Total 0.3 0.0 - 1.2 mg/dL   Alkaline Phosphatase 92 44 - 121 IU/L   AST 27 0 - 40 IU/L   ALT 12 0 - 32 IU/L  CBC with Differential/Platelet  Result Value Ref Range   WBC 5.4 3.4 - 10.8 x10E3/uL   RBC 3.69 (L) 3.77 - 5.28 x10E6/uL   Hemoglobin 12.3 11.1 - 15.9 g/dL   Hematocrit 36.1 34.0 - 46.6 %   MCV 98 (H) 79 - 97 fL   MCH 33.3 (H) 26.6 - 33.0 pg   MCHC 34.1 31.5 - 35.7 g/dL   RDW 12.8 11.7 - 15.4 %   Platelets 202 150 - 450 x10E3/uL   Neutrophils 37 Not Estab. %   Lymphs 30 Not Estab. %   Monocytes 11 Not Estab. %   Eos 21 Not Estab. %   Basos 1 Not Estab. %   Neutrophils Absolute 2.0 1.4 - 7.0 x10E3/uL   Lymphocytes Absolute 1.6 0.7 - 3.1 x10E3/uL   Monocytes Absolute  0.6 0.1 - 0.9 x10E3/uL   EOS (ABSOLUTE) 1.1 (H) 0.0 - 0.4 x10E3/uL   Basophils Absolute 0.1 0.0 - 0.2 x10E3/uL   Immature Granulocytes 0 Not Estab. %   Immature Grans (Abs) 0.0 0.0 - 0.1 x10E3/uL   Hematology Comments: Note:   Lipid Panel w/o Chol/HDL Ratio  Result Value Ref Range   Cholesterol, Total 141 100 - 199 mg/dL   Triglycerides 65 0 - 149 mg/dL   HDL 74 >39 mg/dL   VLDL Cholesterol Cal 13 5 - 40 mg/dL   LDL Chol Calc (NIH) 54 0 - 99 mg/dL  TSH  Result Value Ref Range   TSH 2.720 0.450 - 4.500 uIU/mL  VITAMIN D 25 Hydroxy (Vit-D Deficiency, Fractures)  Result Value Ref Range   Vit D, 25-Hydroxy 27.7 (L) 30.0 - 100.0 ng/mL  Urinalysis, Routine w reflex microscopic  Result Value Ref Range   Specific Gravity, UA >1.030 (H) 1.005 - 1.030   pH, UA 5.5 5.0 - 7.5   Color, UA Yellow Yellow   Appearance Ur Clear Clear   Leukocytes,UA Negative Negative   Protein,UA Negative Negative/Trace   Glucose, UA Negative Negative   Ketones, UA Negative Negative   RBC, UA Negative Negative   Bilirubin, UA Negative Negative   Urobilinogen, Ur 0.2 0.2 - 1.0 mg/dL   Nitrite, UA Negative Negative  HCV RNA quant  Result Value Ref Range   Hepatitis C Quantitation HCV Not Detected IU/mL   Test Information Comment       Assessment & Plan:   Problem List Items Addressed This Visit       Musculoskeletal and Integument   Compression fracture of third lumbar vertebra [facility])   Other Visit Diagnoses     Routine general medical examination at a health care facility    -  Primary   Vaccines up to date/declined. Screening labs checked last visit. Mammo and DEXA ordered. Cologuard ordered. Continue diet and exercise. Call with any concerns.    Blurred vision       Referral to opthalmology made today.   Relevant Orders   Ambulatory referral to Ophthalmology   Screening for colon cancer       Cologuard ordered.    Relevant Orders  Cologuard        Follow up plan: Return in about 6  months (around 06/29/2022).   LABORATORY TESTING:  - Pap smear: not applicable  IMMUNIZATIONS:   - Tdap: Tetanus vaccination status reviewed: last tetanus booster within 10 years. - Influenza: Up to date - Pneumovax: Up to date - Prevnar: Up to date - COVID: Up to date - HPV: Not applicable - Shingrix vaccine: Refused  SCREENING: -Mammogram: Ordered today  - Colonoscopy: Ordered today  - Bone Density: Ordered today   PATIENT COUNSELING:   Advised to take 1 mg of folate supplement per day if capable of pregnancy.   Sexuality: Discussed sexually transmitted diseases, partner selection, use of condoms, avoidance of unintended pregnancy  and contraceptive alternatives.   Advised to avoid cigarette smoking.  I discussed with the patient that most people either abstain from alcohol or drink within safe limits (<=14/week and <=4 drinks/occasion for males, <=7/weeks and <= 3 drinks/occasion for females) and that the risk for alcohol disorders and other health effects rises proportionally with the number of drinks per week and how often a drinker exceeds daily limits.  Discussed cessation/primary prevention of drug use and availability of treatment for abuse.   Diet: Encouraged to adjust caloric intake to maintain  or achieve ideal body weight, to reduce intake of dietary saturated fat and total fat, to limit sodium intake by avoiding high sodium foods and not adding table salt, and to maintain adequate dietary potassium and calcium preferably from fresh fruits, vegetables, and low-fat dairy products.    stressed the importance of regular exercise  Injury prevention: Discussed safety belts, safety helmets, smoke detector, smoking near bedding or upholstery.   Dental health: Discussed importance of regular tooth brushing, flossing, and dental visits.    NEXT PREVENTATIVE PHYSICAL DUE IN 1 YEAR. Return in about 6 months (around 06/29/2022).

## 2021-12-30 NOTE — Patient Instructions (Addendum)
Please call to schedule your mammogram and bone density: °[facility]  °Address: [address], [place], [place] [address]  °Phone: [phone] ° °

## 2022-01-02 ENCOUNTER — Other Ambulatory Visit: Payer: Self-pay

## 2022-01-02 DIAGNOSIS — F17210 Nicotine dependence, cigarettes, uncomplicated: Secondary | ICD-10-CM

## 2022-01-02 DIAGNOSIS — Z87891 Personal history of nicotine dependence: Secondary | ICD-10-CM

## 2022-01-10 ENCOUNTER — Ambulatory Visit: Payer: 59 | Attending: Acute Care

## 2022-01-30 ENCOUNTER — Ambulatory Visit (INDEPENDENT_AMBULATORY_CARE_PROVIDER_SITE_OTHER): Payer: 59 | Admitting: *Deleted

## 2022-01-30 DIAGNOSIS — Z0000 Encounter for general adult medical examination without abnormal findings: Secondary | ICD-10-CM | POA: Diagnosis not present

## 2022-01-30 NOTE — Progress Notes (Signed)
Subjective:   Vanessa Oneal is a 71 y.o. female who presents for Medicare Annual (Subsequent) preventive examination.  I connected with  LAUREEN KONARSKI on 01/30/22 by a telephone enabled telemedicine application and verified that I am speaking with the correct person using two identifiers.   I discussed the limitations of evaluation and management by telemedicine. The patient expressed understanding and agreed to proceed.  Patient location: home  Provider location: tele-health not in office    Review of Systems           Objective:    There were no vitals filed for this visit. There is no height or weight on file to calculate BMI.  Advanced Directives 03/26/2021 01/28/2021 01/26/2020 05/06/2019 02/04/2018 02/09/2016  Does Patient Have a Medical Advance Directive? No No No No No No  Would patient like information on creating a medical advance directive? - - Yes (MAU/Ambulatory/Procedural Areas - Information given) Yes (MAU/Ambulatory/Procedural Areas - Information given) Yes (MAU/Ambulatory/Procedural Areas - Information given) -    Current Medications (verified) Outpatient Encounter Medications as of 01/30/2022  Medication Sig   albuterol (VENTOLIN HFA) 108 (90 Base) MCG/ACT inhaler Inhale 2 puffs into the lungs every 6 (six) hours as needed for wheezing or shortness of breath.   diclofenac Sodium (VOLTAREN) 1 % GEL Apply 4 g topically 4 (four) times daily.   promethazine (PHENERGAN) 25 MG tablet TAKE 1/2 TABLET BY MOUTH EVERY 8 HOURS AS NEEDED FOR NAUSEA AND VOMITING   No facility-administered encounter medications on file as of 01/30/2022.    Allergies (verified) Gabapentin   History: Past Medical History:  Diagnosis Date   Arthritis    RA   Depression    Fibromyalgia    GERD (gastroesophageal reflux disease)    H/O drug dependence (HCC)    opiods   Wears dentures    full upper   Past Surgical History:  Procedure Laterality Date   BREAST SURGERY     Silicone  implants, then removal   CATARACT EXTRACTION W/PHACO Left 05/06/2019   Procedure: CATARACT EXTRACTION PHACO AND INTRAOCULAR LENS PLACEMENT ([facility]) LEFT;  Surgeon: Galen Manila, MD;  [facility];  Service: Ophthalmology;  Laterality: Left;   TUBAL LIGATION     Family History  Problem Relation Age of Onset   Suicidality Father    COPD Sister    Social History   Socioeconomic History   Marital status: Widowed    Spouse name: Not on file   Number of children: Not on file   Years of education: Not on file   Highest education level: Not on file  Occupational History   Occupation: retired  Tobacco Use   Smoking status: Light Smoker    Packs/day: 0.25    Years: 56.00    Pack years: 14.00    Types: Cigarettes    Last attempt to quit: 11/04/2019    Years since quitting: 2.2   Smokeless tobacco: Never   Tobacco comments:    1 pack with last a couple days   Vaping Use   Vaping Use: Former  Substance and Sexual Activity   Alcohol use: Not Currently   Drug use: No   Sexual activity: Not Currently  Other Topics Concern   Not on file  Social History Narrative   ** Merged History Encounter **       Social Determinants of Health   Financial Resource Strain: Low Risk    Difficulty of Paying Living Expenses: Not hard at all  Food Insecurity: No Food Insecurity   Worried About Programme researcher, broadcasting/film/videounning Out of Food in the Last Year: Never true   Ran Out of Food in the Last Year: Never true  Transportation Needs: No Transportation Needs   Lack of Transportation (Medical): No   Lack of Transportation (Non-Medical): No  Physical Activity: Inactive   Days of Exercise per Week: 0 days   Minutes of Exercise per Session: 0 min  Stress: Stress Concern Present   Feeling of Stress : To some extent  Social Connections: Not on file    Tobacco Counseling Ready to quit: Not Answered Counseling given: Not Answered Tobacco comments: 1 pack with last a couple days    Clinical Intake:                  Diabetic?  no         Activities of Daily Living In your present state of health, do you have any difficulty performing the following activities: 12/30/2021  Hearing? Y  Vision? Y  Difficulty concentrating or making decisions? N  Walking or climbing stairs? N  Dressing or bathing? N  Doing errands, shopping? N  Some recent data might be hidden    Patient Care Team: Dorcas CarrowJohnson, Megan P, DO as PCP - General (Family Medicine) Pcp, No  Indicate any recent Medical Services you may have received from other than [facility] providers in the past year (date may be approximate).     Assessment:   This is a routine wellness examination for Vanessa Oneal.  Hearing/Vision screen No results found.  Dietary issues and exercise activities discussed:     Goals Addressed   None    Depression Screen PHQ 2/9 Scores 12/30/2021 06/03/2021 01/28/2021 01/07/2021 03/10/2020 01/26/2020 02/04/2018  PHQ - 2 Score 5 6 6 1 2 1 2   PHQ- 9 Score 18 18 8 2 7  - 4    Fall Risk Fall Risk  01/28/2021 01/07/2021 01/26/2020 02/04/2018 11/22/2017  Falls in the past year? 0 1 0 No No  Number falls in past yr: - - 0 - -  Injury with Fall? - 0 0 - -  Risk for fall due to : Medication side effect - - - -  Follow up Falls evaluation completed;Education provided;Falls prevention discussed - - - -    FALL RISK PREVENTION PERTAINING TO THE HOME:  Any stairs in or around the home? Yes  If so, are there any without handrails? No  Home free of loose throw rugs in walkways, pet beds, electrical cords, etc? Yes  Adequate lighting in your home to reduce risk of falls? Yes   ASSISTIVE DEVICES UTILIZED TO PREVENT FALLS:  Life alert? No  Use of a cane, walker or w/c? No  Grab bars in the bathroom? Yes  Shower chair or bench in shower? Yes  Elevated toilet seat or a handicapped toilet? Yes   TIMED UP AND GO:  Was the test performed? No .   Cognitive Function:     6CIT Screen 01/28/2021 02/04/2018  What Year? 0  points 0 points  What month? 0 points 0 points  What time? 0 points 0 points  Count back from 20 0 points 0 points  Months in reverse 4 points 0 points  Repeat phrase 4 points 0 points  Total Score 8 0    Immunizations Immunization History  Administered Date(s) Administered   Fluad Quad(high Dose 65+) 11/14/2021   Influenza, High Dose Seasonal PF 11/19/2017   Influenza, Seasonal, Injecte, Preservative Fre  10/19/2006   Influenza,inj,Quad PF,6+ Mos 09/18/2012, 08/06/2013, 09/03/2015   Moderna Sars-Covid-2 Vaccination 05/11/2020, 06/11/2020   Pneumococcal Conjugate-13 11/22/2017   Pneumococcal Polysaccharide-23 03/04/2020   Td 11/14/2021    TDAP status: Up to date  Flu Vaccine status: Up to date  Pneumococcal vaccine status: Up to date  Covid-19 vaccine status: Information provided on how to obtain vaccines.   Qualifies for Shingles Vaccine? Yes   Zostavax completed Yes   Shingrix Completed?: No.    Education has been provided regarding the importance of this vaccine. Patient has been advised to call insurance company to determine out of pocket expense if they have not yet received this vaccine. Advised may also receive vaccine at local pharmacy or Health Dept. Verbalized acceptance and understanding.  Screening Tests Health Maintenance  Topic Date Due   COLONOSCOPY (Pts 45-22yrs Insurance coverage will need to be confirmed)  Never done   MAMMOGRAM  Never done   Zoster Vaccines- Shingrix (1 of 2) Never done   DEXA SCAN  Never done   COVID-19 Vaccine (3 - Booster for Moderna series) 08/06/2020   TETANUS/TDAP  11/15/2031   Pneumonia Vaccine 33+ Years old  Completed   INFLUENZA VACCINE  Completed   Hepatitis C Screening  Completed   HPV VACCINES  Aged Out    Health Maintenance  Health Maintenance Due  Topic Date Due   COLONOSCOPY (Pts 45-109yrs Insurance coverage will need to be confirmed)  Never done   MAMMOGRAM  Never done   Zoster Vaccines- Shingrix (1 of 2) Never  done   DEXA SCAN  Never done   COVID-19 Vaccine (3 - Booster for Moderna series) 08/06/2020  Cologuard ordered.  Patient has not done yet    Mammogram status: Ordered  . Pt provided with contact info and advised to call to schedule appt.   Bone Density status: Ordered  . Pt provided with contact info and advised to call to schedule appt.  Lung Cancer Screening: (Low Dose CT Chest recommended if Age 42-80 years, 30 pack-year currently smoking OR have quit w/in 15years.) does qualify.   Lung Cancer Screening Referral: patient has no showed her appointment  Additional Screening:  Hepatitis C Screening: does not qualify; Completed   Vision Screening: Recommended annual ophthalmology exams for early detection of glaucoma and other disorders of the eye. Is the patient up to date with their annual eye exam?  No  Who is the provider or what is the name of the office in which the patient attends annual eye exams?  If pt is not established with a provider, would they like to be referred to a provider to establish care? No .   Dental Screening: Recommended annual dental exams for proper oral hygiene  Community Resource Referral / Chronic Care Management: CRR required this visit?  No   CCM required this visit?  No      Plan:     I have personally reviewed and noted the following in the patients chart:   Medical and social history Use of alcohol, tobacco or illicit drugs  Current medications and supplements including opioid prescriptions.  Functional ability and status Nutritional status Physical activity Advanced directives List of other physicians Hospitalizations, surgeries, and ER visits in previous 12 months Vitals Screenings to include cognitive, depression, and falls Referrals and appointments  In addition, I have reviewed and discussed with patient certain preventive protocols, quality metrics, and best practice recommendations. A written personalized care plan for  preventive services as well as  general preventive health recommendations were provided to patient.     Remi Haggard, LPN   [id number]   Nurse Notes:

## 2022-01-30 NOTE — Patient Instructions (Signed)
Vanessa Oneal , Thank you for taking time to come for your Medicare Wellness Visit. I appreciate your ongoing commitment to your health goals. Please review the following plan we discussed and let me know if I can assist you in the future.   Screening recommendations/referrals: Colonoscopy: Education Provided Mammogram: education provided Bone Density: education provided Recommended yearly ophthalmology/optometry visit for glaucoma screening and checkup Recommended yearly dental visit for hygiene and checkup  Vaccinations: Influenza vaccine: up to date Pneumococcal vaccine: up to date Tdap vaccine: up to date Shingles vaccine: Education provided    Advanced directives: Education provided  Conditions/risks identified:   Next appointment:    Preventive Care 65 Years and Older, Female Preventive care refers to lifestyle choices and visits with your health care provider that can promote health and wellness. What does preventive care include? A yearly physical exam. This is also called an annual well check. Dental exams once or twice a year. Routine eye exams. Ask your health care provider how often you should have your eyes checked. Personal lifestyle choices, including: Daily care of your teeth and gums. Regular physical activity. Eating a healthy diet. Avoiding tobacco and drug use. Limiting alcohol use. Practicing safe sex. Taking low-dose aspirin every day. Taking vitamin and mineral supplements as recommended by your health care provider. What happens during an annual well check? The services and screenings done by your health care provider during your annual well check will depend on your age, overall health, lifestyle risk factors, and family history of disease. Counseling  Your health care provider may ask you questions about your: Alcohol use. Tobacco use. Drug use. Emotional well-being. Home and relationship well-being. Sexual activity. Eating habits. History of  falls. Memory and ability to understand (cognition). Work and work Astronomer. Reproductive health. Screening  You may have the following tests or measurements: Height, weight, and BMI. Blood pressure. Lipid and cholesterol levels. These may be checked every 5 years, or more frequently if you are over 7 years old. Skin check. Lung cancer screening. You may have this screening every year starting at age 64 if you have a 30-pack-year history of smoking and currently smoke or have quit within the past 15 years. Fecal occult blood test (FOBT) of the stool. You may have this test every year starting at age 23. Flexible sigmoidoscopy or colonoscopy. You may have a sigmoidoscopy every 5 years or a colonoscopy every 10 years starting at age 74. Hepatitis C blood test. Hepatitis B blood test. Sexually transmitted disease (STD) testing. Diabetes screening. This is done by checking your blood sugar (glucose) after you have not eaten for a while (fasting). You may have this done every 1-3 years. Bone density scan. This is done to screen for osteoporosis. You may have this done starting at age 18. Mammogram. This may be done every 1-2 years. Talk to your health care provider about how often you should have regular mammograms. Talk with your health care provider about your test results, treatment options, and if necessary, the need for more tests. Vaccines  Your health care provider may recommend certain vaccines, such as: Influenza vaccine. This is recommended every year. Tetanus, diphtheria, and acellular pertussis (Tdap, Td) vaccine. You may need a Td booster every 10 years. Zoster vaccine. You may need this after age 1. Pneumococcal 13-valent conjugate (PCV13) vaccine. One dose is recommended after age 86. Pneumococcal polysaccharide (PPSV23) vaccine. One dose is recommended after age 72. Talk to your health care provider about which screenings and vaccines you  need and how often you need  them. This information is not intended to replace advice given to you by your health care provider. Make sure you discuss any questions you have with your health care provider. Document Released: 12/17/2015 Document Revised: 08/09/2016 Document Reviewed: 09/21/2015 Elsevier Interactive Patient Education  2017 [place] Prevention in the Home Falls can cause injuries. They can happen to people of all ages. There are many things you can do to make your home safe and to help prevent falls. What can I do on the outside of my home? Regularly fix the edges of walkways and driveways and fix any cracks. Remove anything that might make you trip as you walk through a door, such as a raised step or threshold. Trim any bushes or trees on the path to your home. Use bright outdoor lighting. Clear any walking paths of anything that might make someone trip, such as rocks or tools. Regularly check to see if handrails are loose or broken. Make sure that both sides of any steps have handrails. Any raised decks and porches should have guardrails on the edges. Have any leaves, snow, or ice cleared regularly. Use sand or salt on walking paths during winter. Clean up any spills in your garage right away. This includes oil or grease spills. What can I do in the bathroom? Use night lights. Install grab bars by the toilet and in the tub and shower. Do not use towel bars as grab bars. Use non-skid mats or decals in the tub or shower. If you need to sit down in the shower, use a plastic, non-slip stool. Keep the floor dry. Clean up any water that spills on the floor as soon as it happens. Remove soap buildup in the tub or shower regularly. Attach bath mats securely with double-sided non-slip rug tape. Do not have throw rugs and other things on the floor that can make you trip. What can I do in the bedroom? Use night lights. Make sure that you have a light by your bed that is easy to reach. Do not use  any sheets or blankets that are too big for your bed. They should not hang down onto the floor. Have a firm chair that has side arms. You can use this for support while you get dressed. Do not have throw rugs and other things on the floor that can make you trip. What can I do in the kitchen? Clean up any spills right away. Avoid walking on wet floors. Keep items that you use a lot in easy-to-reach places. If you need to reach something above you, use a strong step stool that has a grab bar. Keep electrical cords out of the way. Do not use floor polish or wax that makes floors slippery. If you must use wax, use non-skid floor wax. Do not have throw rugs and other things on the floor that can make you trip. What can I do with my stairs? Do not leave any items on the stairs. Make sure that there are handrails on both sides of the stairs and use them. Fix handrails that are broken or loose. Make sure that handrails are as long as the stairways. Check any carpeting to make sure that it is firmly attached to the stairs. Fix any carpet that is loose or worn. Avoid having throw rugs at the top or bottom of the stairs. If you do have throw rugs, attach them to the floor with carpet tape. Make sure that  you have a light switch at the top of the stairs and the bottom of the stairs. If you do not have them, ask someone to add them for you. What else can I do to help prevent falls? Wear shoes that: Do not have high heels. Have rubber bottoms. Are comfortable and fit you well. Are closed at the toe. Do not wear sandals. If you use a stepladder: Make sure that it is fully opened. Do not climb a closed stepladder. Make sure that both sides of the stepladder are locked into place. Ask someone to hold it for you, if possible. Clearly mark and make sure that you can see: Any grab bars or handrails. First and last steps. Where the edge of each step is. Use tools that help you move around (mobility aids)  if they are needed. These include: Canes. Walkers. Scooters. Crutches. Turn on the lights when you go into a dark area. Replace any light bulbs as soon as they burn out. Set up your furniture so you have a clear path. Avoid moving your furniture around. If any of your floors are uneven, fix them. If there are any pets around you, be aware of where they are. Review your medicines with your doctor. Some medicines can make you feel dizzy. This can increase your chance of falling. Ask your doctor what other things that you can do to help prevent falls. This information is not intended to replace advice given to you by your health care provider. Make sure you discuss any questions you have with your health care provider. Document Released: 09/16/2009 Document Revised: 04/27/2016 Document Reviewed: 12/25/2014 Elsevier Interactive Patient Education  2017 [employer].

## 2022-02-01 NOTE — Progress Notes (Signed)
I, as supervising physician, have reviewed the nurse health advisor's Medicare Wellness Visit note for this patient and concur with the findings and recommendations listed above.   

## 2022-02-10 ENCOUNTER — Ambulatory Visit: Payer: 59 | Admitting: Family Medicine

## 2022-04-13 ENCOUNTER — Other Ambulatory Visit: Payer: Self-pay | Admitting: Family Medicine

## 2022-04-13 MED ORDER — PROMETHAZINE HCL 25 MG PO TABS: ORAL_TABLET | ORAL | 1 refills | Status: DC

## 2022-04-13 NOTE — Telephone Encounter (Signed)
Requested medication (s) are due for refill today - yes ? ?Requested medication (s) are on the active medication list -yes ? ?Future visit scheduled -yes ? ?Last refill: 12/28/21 #30 1RF ? ?Notes to clinic: non delegated Rx ? ?Requested Prescriptions  ?Pending Prescriptions Disp Refills  ? promethazine (PHENERGAN) 25 MG tablet 30 tablet 1  ?  Sig: TAKE 1/2 TABLET BY MOUTH EVERY 8 HOURS AS NEEDED FOR NAUSEA AND VOMITING  ?  ? Not Delegated - Gastroenterology: Antiemetics Failed - 04/13/2022  1:49 PM  ?  ?  Failed - This refill cannot be delegated  ?  ?  Passed - Valid encounter within last 6 months  ?  Recent Outpatient Visits   ? ?      ? 3 months ago Routine general medical [facility] a health care facility  [facility] [place], Megan P, DO  ? 5 months ago Compression fracture of L3 vertebra, initial encounter (HCC)  [facility] [place] P, [facility]  ? 10 months ago [facility] abuse  ? [facility] [place], [place], DO  ? 1 year ago Centrilobular emphysema (HCC)  ? [facility] [place], Corrie DandyJolene T, NP  ? 1 year ago Leg cramps  ? [facility] [place], [place] P, DO  ? ?  ?  ?Future Appointments   ? ?        ? In 2 months Laural BenesJohnson, Oralia RudMegan P, DO [facility]  ? ?  ? ? ?  ?  ?  ? ? ? ?Requested Prescriptions  ?Pending Prescriptions Disp Refills  ? promethazine (PHENERGAN) 25 MG tablet 30 tablet 1  ?  Sig: TAKE 1/2 TABLET BY MOUTH EVERY 8 HOURS AS NEEDED FOR NAUSEA AND VOMITING  ?  ? Not Delegated - Gastroenterology: Antiemetics Failed - 04/13/2022  1:49 PM  ?  ?  Failed - This refill cannot be delegated  ?  ?  Passed - Valid encounter within last 6 months  ?  Recent Outpatient Visits   ? ?      ? 3 months ago Routine general medical examination at a health care facility  ? [facility] [place], Megan P, DO  ? 5 months ago Compression fracture of L3 vertebra, initial encounter (HCC)  ? [facility] [place], Megan P, DO  ?  10 months ago Alcohol abuse  ? [facility] [place], [place] P, DO  ? 1 year ago Centrilobular emphysema (HCC)  ? [facility] [place], Corrie DandyJolene T, NP  ? 1 year ago Leg cramps  ? [facility] [place], [place] P, DO  ? ?  ?  ?Future Appointments   ? ?        ? In 2 months Laural BenesJohnson, Oralia RudMegan P, DO [facility]  ? ?  ? ? ?  ?  ?  ? ? ? ?

## 2022-04-13 NOTE — Telephone Encounter (Signed)
Medication Refill - Medication:  ? ?promethazine (PHENERGAN) 25 MG tablet  ? ?Has the patient contacted their pharmacy? Yes.  No refills left.  ?(Agent: If no, request that the patient contact the pharmacy for the refill. If patient does not wish to contact the pharmacy document the reason why and proceed with request.) ?(Agent: If yes, when and what did the pharmacy advise?) ? ?Preferred Pharmacy (with phone number or street name):  ? ?[id number] - [place], [place] - [address] AT [facility] [address]  ?[address] [place] [address]  ?Phone: [phone] Fax: [phone]  ? ? ?Has the patient been seen for an appointment in the last year OR does the patient have an upcoming appointment? Yes.   ? ?Agent: Please be advised that RX refills may take up to 3 business days. We ask that you follow-up with your pharmacy. ? ?

## 2022-05-04 ENCOUNTER — Other Ambulatory Visit: Payer: Self-pay | Admitting: Family Medicine

## 2022-05-04 NOTE — Telephone Encounter (Signed)
Medication Refill - Medication: promethazine (PHENERGAN) 25 MG tablet [[id number]]   Has the patient contacted their pharmacy? No. (  Preferred Pharmacy (with phone number or street name):  [id number] - [place], [place] - [address] AT [address] Phone:  [phone]  Fax:  [phone]     Has the patient been seen for an appointment in the last year OR does the patient have an upcoming appointment? Yes.    Agent: Please be advised that RX refills may take up to 3 business days. We ask that you follow-up with your pharmacy.

## 2022-05-05 NOTE — Telephone Encounter (Signed)
Requested medication (s) are due for refill today:   Yes  Requested medication (s) are on the active medication list:   Yes but expired  Future visit scheduled:   Yes   Last ordered: 04/13/2022 #30, 1   Returned because this is a non delegated refill per protocol   Requested Prescriptions  Pending Prescriptions Disp Refills   promethazine (PHENERGAN) 25 MG tablet 30 tablet 1    Sig: TAKE 1/2 TABLET BY MOUTH EVERY 8 HOURS AS NEEDED FOR NAUSEA AND VOMITING     Not Delegated - Gastroenterology: Antiemetics Failed - 05/04/2022  2:35 PM      Failed - This refill cannot be delegated      Passed - Valid encounter within last 6 months    Recent Outpatient Visits           4 months ago Routine general medical examination at a health care facility   [facility] Johnson, Megan P, DO   5 months ago Compression fracture of L3 vertebra, initial encounter [facility](HCC)   [facility] [place], Megan P, [facility]   11 months ago Alcohol abuse   [facility] [place], [place] P, DO   1 year ago Centrilobular emphysema (HCC)   [facility] [place]annady, Corrie DandyJolene T, NP   1 year ago Leg cramps   [facility] [place], [place] P, DO       Future Appointments             In 1 month Johnson, Oralia RudMegan P, DO [facility]

## 2022-06-29 ENCOUNTER — Ambulatory Visit: Payer: 59 | Admitting: Family Medicine

## 2022-07-13 ENCOUNTER — Ambulatory Visit: Payer: Medicare Other | Admitting: Family Medicine

## 2022-07-17 ENCOUNTER — Emergency Department: Payer: Medicare Other

## 2022-07-17 ENCOUNTER — Inpatient Hospital Stay
Admission: EM | Admit: 2022-07-17 | Discharge: 2022-07-23 | DRG: 871 | Disposition: A | Discharge status: Home Health | Payer: Medicare Other | Attending: Obstetrics and Gynecology | Admitting: Obstetrics and Gynecology

## 2022-07-17 ENCOUNTER — Other Ambulatory Visit: Payer: Self-pay

## 2022-07-17 DIAGNOSIS — Z9842 Cataract extraction status, left eye: Secondary | ICD-10-CM

## 2022-07-17 DIAGNOSIS — F1310 Sedative, hypnotic or anxiolytic abuse, uncomplicated: Secondary | ICD-10-CM | POA: Diagnosis present

## 2022-07-17 DIAGNOSIS — F419 Anxiety disorder, unspecified: Secondary | ICD-10-CM | POA: Diagnosis present

## 2022-07-17 DIAGNOSIS — M4856XA Collapsed vertebra, not elsewhere classified, lumbar region, initial encounter for fracture: Secondary | ICD-10-CM | POA: Diagnosis present

## 2022-07-17 DIAGNOSIS — A419 Sepsis, unspecified organism: Secondary | ICD-10-CM | POA: Diagnosis not present

## 2022-07-17 DIAGNOSIS — R6889 Other general symptoms and signs: Secondary | ICD-10-CM | POA: Diagnosis not present

## 2022-07-17 DIAGNOSIS — F17210 Nicotine dependence, cigarettes, uncomplicated: Secondary | ICD-10-CM | POA: Diagnosis not present

## 2022-07-17 DIAGNOSIS — R739 Hyperglycemia, unspecified: Secondary | ICD-10-CM | POA: Diagnosis present

## 2022-07-17 DIAGNOSIS — R6521 Severe sepsis with septic shock: Secondary | ICD-10-CM | POA: Diagnosis present

## 2022-07-17 DIAGNOSIS — Z716 Tobacco abuse counseling: Secondary | ICD-10-CM

## 2022-07-17 DIAGNOSIS — E46 Unspecified protein-calorie malnutrition: Secondary | ICD-10-CM | POA: Diagnosis present

## 2022-07-17 DIAGNOSIS — M069 Rheumatoid arthritis, unspecified: Secondary | ICD-10-CM | POA: Diagnosis present

## 2022-07-17 DIAGNOSIS — E871 Hypo-osmolality and hyponatremia: Secondary | ICD-10-CM | POA: Diagnosis present

## 2022-07-17 DIAGNOSIS — W19XXXA Unspecified fall, initial encounter: Secondary | ICD-10-CM | POA: Diagnosis present

## 2022-07-17 DIAGNOSIS — R059 Cough, unspecified: Secondary | ICD-10-CM | POA: Diagnosis not present

## 2022-07-17 DIAGNOSIS — Z20822 Contact with and (suspected) exposure to covid-19: Secondary | ICD-10-CM | POA: Diagnosis present

## 2022-07-17 DIAGNOSIS — R0902 Hypoxemia: Secondary | ICD-10-CM | POA: Diagnosis not present

## 2022-07-17 DIAGNOSIS — J189 Pneumonia, unspecified organism: Secondary | ICD-10-CM | POA: Diagnosis present

## 2022-07-17 DIAGNOSIS — K219 Gastro-esophageal reflux disease without esophagitis: Secondary | ICD-10-CM | POA: Diagnosis present

## 2022-07-17 DIAGNOSIS — Z825 Family history of asthma and other chronic lower respiratory diseases: Secondary | ICD-10-CM

## 2022-07-17 DIAGNOSIS — E43 Unspecified severe protein-calorie malnutrition: Secondary | ICD-10-CM | POA: Diagnosis not present

## 2022-07-17 DIAGNOSIS — R531 Weakness: Secondary | ICD-10-CM | POA: Diagnosis not present

## 2022-07-17 DIAGNOSIS — S32030A Wedge compression fracture of third lumbar vertebra, initial encounter for closed fracture: Secondary | ICD-10-CM | POA: Diagnosis present

## 2022-07-17 DIAGNOSIS — I700 Atherosclerosis of aorta: Secondary | ICD-10-CM | POA: Diagnosis not present

## 2022-07-17 DIAGNOSIS — Z961 Presence of intraocular lens: Secondary | ICD-10-CM | POA: Diagnosis present

## 2022-07-17 DIAGNOSIS — I499 Cardiac arrhythmia, unspecified: Secondary | ICD-10-CM | POA: Diagnosis not present

## 2022-07-17 DIAGNOSIS — R069 Unspecified abnormalities of breathing: Secondary | ICD-10-CM | POA: Diagnosis not present

## 2022-07-17 DIAGNOSIS — B953 Streptococcus pneumoniae as the cause of diseases classified elsewhere: Secondary | ICD-10-CM

## 2022-07-17 DIAGNOSIS — R918 Other nonspecific abnormal finding of lung field: Secondary | ICD-10-CM | POA: Diagnosis not present

## 2022-07-17 DIAGNOSIS — J9601 Acute respiratory failure with hypoxia: Secondary | ICD-10-CM | POA: Diagnosis present

## 2022-07-17 DIAGNOSIS — T380X5A Adverse effect of glucocorticoids and synthetic analogues, initial encounter: Secondary | ICD-10-CM | POA: Diagnosis not present

## 2022-07-17 DIAGNOSIS — R300 Dysuria: Secondary | ICD-10-CM | POA: Diagnosis not present

## 2022-07-17 DIAGNOSIS — A403 Sepsis due to Streptococcus pneumoniae: Principal | ICD-10-CM | POA: Diagnosis present

## 2022-07-17 DIAGNOSIS — R0602 Shortness of breath: Secondary | ICD-10-CM | POA: Diagnosis not present

## 2022-07-17 DIAGNOSIS — R000 Tachycardia, unspecified: Secondary | ICD-10-CM | POA: Diagnosis not present

## 2022-07-17 DIAGNOSIS — E872 Acidosis, unspecified: Secondary | ICD-10-CM | POA: Diagnosis present

## 2022-07-17 DIAGNOSIS — Z888 Allergy status to other drugs, medicaments and biological substances status: Secondary | ICD-10-CM

## 2022-07-17 DIAGNOSIS — B379 Candidiasis, unspecified: Secondary | ICD-10-CM | POA: Diagnosis not present

## 2022-07-17 DIAGNOSIS — M797 Fibromyalgia: Secondary | ICD-10-CM | POA: Diagnosis present

## 2022-07-17 DIAGNOSIS — J432 Centrilobular emphysema: Secondary | ICD-10-CM | POA: Diagnosis present

## 2022-07-17 DIAGNOSIS — E861 Hypovolemia: Secondary | ICD-10-CM | POA: Diagnosis present

## 2022-07-17 DIAGNOSIS — F32A Depression, unspecified: Secondary | ICD-10-CM | POA: Diagnosis not present

## 2022-07-17 DIAGNOSIS — J439 Emphysema, unspecified: Secondary | ICD-10-CM | POA: Diagnosis not present

## 2022-07-17 DIAGNOSIS — Z681 Body mass index (BMI) 19 or less, adult: Secondary | ICD-10-CM | POA: Diagnosis not present

## 2022-07-17 DIAGNOSIS — J849 Interstitial pulmonary disease, unspecified: Secondary | ICD-10-CM | POA: Diagnosis not present

## 2022-07-17 DIAGNOSIS — R6520 Severe sepsis without septic shock: Secondary | ICD-10-CM | POA: Diagnosis not present

## 2022-07-17 DIAGNOSIS — Z743 Need for continuous supervision: Secondary | ICD-10-CM | POA: Diagnosis not present

## 2022-07-17 LAB — CBC WITH DIFFERENTIAL/PLATELET
Abs Immature Granulocytes: 0.24 10*3/uL — ABNORMAL HIGH (ref 0.00–0.07)
Basophils Absolute: 0.1 10*3/uL (ref 0.0–0.1)
Basophils Relative: 1 %
Eosinophils Absolute: 0 10*3/uL (ref 0.0–0.5)
Eosinophils Relative: 0 %
HCT: 29.5 % — ABNORMAL LOW (ref 36.0–46.0)
Hemoglobin: 10.3 g/dL — ABNORMAL LOW (ref 12.0–15.0)
Immature Granulocytes: 1 %
Lymphocytes Relative: 3 %
Lymphs Abs: 0.5 10*3/uL — ABNORMAL LOW (ref 0.7–4.0)
MCH: 33.4 pg (ref 26.0–34.0)
MCHC: 34.9 g/dL (ref 30.0–36.0)
MCV: 95.8 fL (ref 80.0–100.0)
Monocytes Absolute: 0.6 10*3/uL (ref 0.1–1.0)
Monocytes Relative: 3 %
Neutro Abs: 16.7 10*3/uL — ABNORMAL HIGH (ref 1.7–7.7)
Neutrophils Relative %: 92 %
Platelets: 180 10*3/uL (ref 150–400)
RBC: 3.08 MIL/uL — ABNORMAL LOW (ref 3.87–5.11)
RDW: 12.9 % (ref 11.5–15.5)
Smear Review: NORMAL
WBC: 18.5 10*3/uL — ABNORMAL HIGH (ref 4.0–10.5)
nRBC: 0 % (ref 0.0–0.2)

## 2022-07-17 LAB — COMPREHENSIVE METABOLIC PANEL
ALT: 14 U/L (ref 0–44)
AST: 26 U/L (ref 15–41)
Albumin: 2 g/dL — ABNORMAL LOW (ref 3.5–5.0)
Alkaline Phosphatase: 81 U/L (ref 38–126)
Anion gap: 9 (ref 5–15)
BUN: 25 mg/dL — ABNORMAL HIGH (ref 8–23)
CO2: 26 mmol/L (ref 22–32)
Calcium: 7.6 mg/dL — ABNORMAL LOW (ref 8.9–10.3)
Chloride: 91 mmol/L — ABNORMAL LOW (ref 98–111)
Creatinine, Ser: 0.71 mg/dL (ref 0.44–1.00)
GFR, Estimated: >60 mL/min (ref >60)
Glucose, Bld: 168 mg/dL — ABNORMAL HIGH (ref 70–99)
Potassium: 3 mmol/L — ABNORMAL LOW (ref 3.5–5.1)
Sodium: 126 mmol/L — ABNORMAL LOW (ref 135–145)
Total Bilirubin: 0.6 mg/dL (ref 0.3–1.2)
Total Protein: 5.3 g/dL — ABNORMAL LOW (ref 6.5–8.1)

## 2022-07-17 LAB — PROTIME-INR
INR: 1.2 (ref 0.8–1.2)
Prothrombin Time: 15.1 seconds (ref 11.4–15.2)

## 2022-07-17 LAB — BLOOD GAS, VENOUS
Acid-Base Excess: 3.2 mmol/L — ABNORMAL HIGH (ref 0.0–2.0)
Bicarbonate: 28.5 mmol/L — ABNORMAL HIGH (ref 20.0–28.0)
O2 Saturation: 41 %
Patient temperature: 37
pCO2, Ven: 45 mmHg (ref 44–60)
pH, Ven: 7.41 (ref 7.25–7.43)
pO2, Ven: <31 mmHg — CL (ref 32–45)

## 2022-07-17 LAB — BRAIN NATRIURETIC PEPTIDE: B Natriuretic Peptide: 263 pg/mL — ABNORMAL HIGH (ref 0.0–100.0)

## 2022-07-17 LAB — APTT: aPTT: 31 seconds (ref 24–36)

## 2022-07-17 LAB — RESP PANEL BY RT-PCR (FLU A&B, COVID) ARPGX2
Influenza A by PCR: NEGATIVE
Influenza B by PCR: NEGATIVE
SARS Coronavirus 2 by RT PCR: NEGATIVE

## 2022-07-17 LAB — TROPONIN I (HIGH SENSITIVITY): Troponin I (High Sensitivity): 10 ng/L (ref <18)

## 2022-07-17 LAB — LACTIC ACID, PLASMA: Lactic Acid, Venous: 2.2 mmol/L (ref 0.5–1.9)

## 2022-07-17 LAB — PROCALCITONIN: Procalcitonin: 3.69 ng/mL

## 2022-07-17 MED ORDER — VANCOMYCIN HCL IN DEXTROSE 1-5 GM/200ML-% IV SOLN
1000.0000 mg | Freq: Once | INTRAVENOUS | Status: AC
  Administered 2022-07-18: 1000 mg via INTRAVENOUS
  Filled 2022-07-17: qty 200

## 2022-07-17 MED ORDER — SODIUM CHLORIDE 0.9 % IV SOLN
2.0000 g | Freq: Once | INTRAVENOUS | Status: AC
  Administered 2022-07-17: 2 g via INTRAVENOUS
  Filled 2022-07-17: qty 12.5

## 2022-07-17 MED ORDER — SODIUM CHLORIDE 0.9 % IV BOLUS
500.0000 mL | Freq: Once | INTRAVENOUS | Status: AC
  Administered 2022-07-17: 500 mL via INTRAVENOUS

## 2022-07-17 MED ORDER — SODIUM CHLORIDE 0.9 % IV SOLN
500.0000 mg | Freq: Once | INTRAVENOUS | Status: DC
  Filled 2022-07-17: qty 5

## 2022-07-17 MED ORDER — LACTATED RINGERS IV BOLUS
1000.0000 mL | Freq: Once | INTRAVENOUS | Status: AC
  Administered 2022-07-17: 1000 mL via INTRAVENOUS

## 2022-07-17 NOTE — ED Triage Notes (Signed)
Pt arrives via [facility] from home where pt was 80% on RA. Fall 2-3 days ago with generalized weakness. CBG 248

## 2022-07-17 NOTE — ED Provider Notes (Signed)
[facility] Provider Note    Event Date/Time   First MD Initiated Contact with Patient 07/17/22 2201     (approximate)   History   Shortness of Breath   HPI  Vanessa Oneal is a 71 y.o. female who comes in from home with a oxygen level of 80% on room air with some generalized weakness for the past 2 to 3 days.  Patient reports having a cough for about a week and having some increased weakness and shortness of breath.  She denies any history of blood clots or COPD.  She denies any abdominal pain.  Denies any falls or hitting her head.  Physical Exam   Triage Vital Signs: ED Triage Vitals [07/17/22 2201]  Enc Vitals Group     BP      Pulse      Resp      Temp      Temp src      SpO2      Weight 95 lb (43.1 kg)     Height 5\' 1"  (1.549 m)     Head Circumference      Peak Flow      Pain Score 0     Pain Loc      Pain Edu?      Excl. in GC?     Most recent vital signs: Vitals:   07/17/22 2217 07/17/22 2218  Temp:  99 F (37.2 C)  SpO2: (!) 89%      General: Awake, no distress.  CV:  Good peripheral perfusion.  Resp:  Normal effort.  Abd:  No distention.  Soft and nontender Other:  Patient on nasal cannula no obvious wheezing no swelling the legs.  No calf tenderness.   ED Results / Procedures / Treatments   Labs (all labs ordered are listed, but only abnormal results are displayed) Labs Reviewed  BLOOD GAS, VENOUS     EKG  My interpretation of EKG:  Sinus tachycardia rate of 123 without any ST elevation or T wave inversions, normal intervals.  They are rated as an acute MI but there is a lot of artifact and I do not feel that this represents a MI.   RADIOLOGY I have reviewed the xray personally and interpreted and she has diffuse right-sided infiltrate consistent with pneumonia   PROCEDURES:  Critical Care performed: Yes, see critical care procedure note(s)  .1-3 Lead EKG Interpretation  Performed by: [id number],  MD Authorized by: Concha Se, MD     Interpretation: abnormal     ECG rate:  115   ECG rate assessment: tachycardic     Rhythm: sinus tachycardia     Ectopy: none     Conduction: normal   .Critical Care  Performed by: Concha Se, MD Authorized by: Concha Se, MD   Critical care provider statement:    Critical care time (minutes):  30   Critical care was necessary to treat or prevent imminent or life-threatening deterioration of the following conditions:  Sepsis   Critical care was time spent personally by me on the following activities:  Development of treatment plan with patient or surrogate, discussions with consultants, evaluation of patient's response to treatment, examination of patient, ordering and review of laboratory studies, ordering and review of radiographic studies, ordering and performing treatments and interventions, pulse oximetry, re-evaluation of patient's condition and review of old charts    MEDICATIONS ORDERED IN ED: Medications - No data to display  IMPRESSION / MDM / ASSESSMENT AND PLAN / ED COURSE  I reviewed the triage vital signs and the nursing notes.   Patient's presentation is most consistent with acute presentation with potential threat to life or bodily function.   Differential is COVID, pneumonia, CHF if x-ray is negative consider PE but seems less likely.  She got no swelling in her legs or history of blood clots  VBG has no evidence of hypercapnia lactate slightly elevated sodium and chloride are low.  White count is elevated x-ray consistent with pneumonia  The patient is on the cardiac monitor to evaluate for evidence of arrhythmia and/or significant heart rate changes.  Clinical Course as of 07/17/22 2330  Mon Jul 17, 2022  2233 pCO2, Ven: 45 [MF]    Clinical Course User Index [MF] Concha Se, MD     FINAL CLINICAL IMPRESSION(S) / ED DIAGNOSES   Final diagnoses:  Sepsis, due to unspecified organism, unspecified whether  acute organ dysfunction present [facility])  Pneumonia of right lung due to infectious organism, unspecified part of lung  Acute respiratory failure with hypoxia (HCC)     Rx / DC Orders   ED Discharge Orders     None        Note:  This document was prepared using Dragon voice recognition software and may include unintentional dictation errors.   Concha Se, MD 07/17/22 2330

## 2022-07-17 NOTE — H&P (Addendum)
History and Physical    Vanessa Oneal:[id number] DOB: 1951/03/08 DOA: 07/17/2022  PCP: Dorcas Carrow, DO  Patient coming from: home  I have personally briefly reviewed patient's old medical records in [facility]  Chief Complaint: sob  HPI: Vanessa Oneal is a 71 y.o. female with medical history significant of GERD,RA,Fibromyalgia,  tobacco abuse, H/o drug abuse (cocaine abuse), anxiety , depression, COPD who presents to ed with one week of cough /sob and generalized weakness. Patient BIB EMS , of note in the field patient was noted to have saturation of 80% on RA and poc blood glucose of 248. Per patient she has fall 5 days ago and after that note pain on right side with coughing. She notes sob and coughing has progressed since then.  She also endorse weight loss on on ROS, which she states as been ongoing for months. She also decrease feeling of early satiety. She notes notes no cardiac type chest discomfort., n/v/d/dysuria. In reference to polysubstance abuse she states she is in remission.   ED Course:  In ED Vitals:99, bp 96/53, hr 118 sat 89% on ra   Na 126, K 3.0, glu 168, cr 0.71 Lactic 2.2 Inr: 1.2 BNP: 263 CE 10 Procal 3.59 PPJ:[id number]: Diffuse right-sided infiltrate consistent with pneumonia YKD:XIPJA tachycardia rate related borderline St changes  Tx LR 1L , cefepime/vanc Review of Systems: As per HPI otherwise 10 point review of systems negative.   Past Medical History:  Diagnosis Date   Arthritis    RA   Depression    Fibromyalgia    GERD (gastroesophageal reflux disease)    H/O drug dependence (HCC)    opiods   Wears dentures    full upper    Past Surgical History:  Procedure Laterality Date   BREAST SURGERY     Silicone implants, then removal   CATARACT EXTRACTION W/PHACO Left 05/06/2019   Procedure: CATARACT EXTRACTION PHACO AND INTRAOCULAR LENS PLACEMENT (IOC) LEFT;  Surgeon: Galen Manila, MD;  Location: [facility];   Service: Ophthalmology;  Laterality: Left;   TUBAL LIGATION       reports that she has been smoking cigarettes. She has a 14.00 pack-year smoking history. She has never used smokeless tobacco. She reports that she does not currently use alcohol. She reports that she does not use drugs.  Allergies  Allergen Reactions   Gabapentin Anaphylaxis    Family History  Problem Relation Age of Onset   Suicidality Father    COPD Sister     Prior to Admission medications   Medication Sig Start Date End Date Taking? Authorizing Provider  albuterol (VENTOLIN HFA) 108 (90 Base) MCG/ACT inhaler Inhale 2 puffs into the lungs every 6 (six) hours as needed for wheezing or shortness of breath. 01/07/21   Cannady, Corrie Dandy T, NP  diclofenac Sodium (VOLTAREN) 1 % GEL Apply 4 g topically 4 (four) times daily. Patient not taking: Reported on 07/17/2022 06/03/21   Olevia Perches P, DO  promethazine (PHENERGAN) 25 MG tablet TAKE 1/2 TABLET BY MOUTH EVERY 8 HOURS AS NEEDED FOR NAUSEA AND VOMITING 04/13/22   Dorcas Carrow, DO    Physical Exam: Vitals:   07/17/22 2217 07/17/22 2218 07/17/22 2218 07/17/22 2300  BP:   (!) 96/53 (!) 113/58  Pulse:   (!) 118 (!) 115  Resp:   (!) 27 (!) 27  Temp:  99 F (37.2 C)    TempSrc:  Oral Oral   SpO2: (!) 89%  93% 95%  Weight:      Height:         Vitals:   07/17/22 2217 07/17/22 2218 07/17/22 2218 07/17/22 2300  BP:   (!) 96/53 (!) 113/58  Pulse:   (!) 118 (!) 115  Resp:   (!) 27 (!) 27  Temp:  99 F (37.2 C)    TempSrc:  Oral Oral   SpO2: (!) 89%  93% 95%  Weight:      Height:      Constitutional: NAD, calm, comfortable,cachetic  Eyes: PERRL, lids and conjunctivae normal ENMT: Mucous membranes are moist. Posterior pharynx clear of any exudate or lesions.Normal dentition.  Neck: normal, supple, no masses, no thyromegaly Respiratory: crackles/rhonchi decrease bs at base on right lung. Cardiovascular: Regular rate and rhythm, no murmurs / rubs / gallops. No  extremity edema. 2+ pedal pulses.  Abdomen: no tenderness, no masses palpated. No hepatosplenomegaly. Bowel sounds positive.  Musculoskeletal: no clubbing / cyanosis. No joint deformity upper and lower extremities. Good ROM, no contractures. Normal muscle tone.  Skin: no rashes, lesions, ulcers. No induration Neurologic: CN 2-12 grossly intact. Sensation intact, DTR normal. Strength 5/5 in all 4.  Psychiatric: Normal judgment and insight. Alert and oriented x 3. Normal mood.    Labs on Admission: I have personally reviewed following labs and imaging studies  CBC: Recent Labs  Lab 07/17/22 2206  WBC 18.5*  NEUTROABS 16.7*  HGB 10.3*  HCT 29.5*  MCV 95.8  PLT 180   Basic Metabolic Panel: Recent Labs  Lab 07/17/22 2206  NA 126*  K 3.0*  CL 91*  CO2 26  GLUCOSE 168*  BUN 25*  CREATININE 0.71  CALCIUM 7.6*   GFR: Estimated Creatinine Clearance: 44.5 mL/min (by C-G formula based on SCr of 0.71 mg/dL). Liver Function Tests: Recent Labs  Lab 07/17/22 2206  AST 26  ALT 14  ALKPHOS 81  BILITOT 0.6  PROT 5.3*  ALBUMIN 2.0*   No results for input(s): "LIPASE", "AMYLASE" in the last 168 hours. No results for input(s): "AMMONIA" in the last 168 hours. Coagulation Profile: Recent Labs  Lab 07/17/22 2206  INR 1.2   Cardiac Enzymes: No results for input(s): "CKTOTAL", "CKMB", "CKMBINDEX", "TROPONINI" in the last 168 hours. BNP (last 3 results) No results for input(s): "PROBNP" in the last 8760 hours. HbA1C: No results for input(s): "HGBA1C" in the last 72 hours. CBG: No results for input(s): "GLUCAP" in the last 168 hours. Lipid Profile: No results for input(s): "CHOL", "HDL", "LDLCALC", "TRIG", "CHOLHDL", "LDLDIRECT" in the last 72 hours. Thyroid Function Tests: No results for input(s): "TSH", "T4TOTAL", "FREET4", "T3FREE", "THYROIDAB" in the last 72 hours. Anemia Panel: No results for input(s): "VITAMINB12", "FOLATE", "FERRITIN", "TIBC", "IRON", "RETICCTPCT" in  the last 72 hours. Urine analysis:    Component Value Date/Time   COLORURINE Yellow 01/02/2014 1721   APPEARANCEUR Clear 11/14/2021 1658   LABSPEC 1.015 01/02/2014 1721   PHURINE 5.0 01/02/2014 1721   GLUCOSEU Negative 11/14/2021 1658   GLUCOSEU Negative 01/02/2014 1721   HGBUR Negative 01/02/2014 1721   BILIRUBINUR Negative 11/14/2021 1658   BILIRUBINUR Negative 01/02/2014 1721   KETONESUR Negative 01/02/2014 1721   PROTEINUR Negative 11/14/2021 1658   PROTEINUR Negative 01/02/2014 1721   NITRITE Negative 11/14/2021 1658   NITRITE Negative 01/02/2014 1721   LEUKOCYTESUR Negative 11/14/2021 1658   LEUKOCYTESUR 3+ 01/02/2014 1721    Radiological Exams on Admission: DG Chest Port 1 View  Result Date: 07/17/2022 CLINICAL DATA:  Possible sepsis with  hypoxia and recent fall EXAM: PORTABLE CHEST 1 VIEW COMPARISON:  07/15/2020 FINDINGS: Cardiac shadow is within normal limits. Lungs are hyperinflated consistent with COPD. Aortic calcifications are noted. Diffuse airspace opacity is noted throughout the right lung consistent with acute pneumonia. No new focal abnormality is noted. IMPRESSION: Diffuse right-sided infiltrate consistent with pneumonia. Electronically Signed   By: Alcide Clever M.D.   On: 07/17/2022 22:47    EKG: Independently reviewed. See above  Assessment/Plan CAP presumed bacterial with Acute hypoxic respiratory failure  -in setting of sepsis: ( +wbc, fever, increase rr, lactic acidosis)  -patient with increase wbc and  Opacities start on broad spectrum abx per protocol  -f/u on urine ag, blood and sputum cultures  -supplemental O2, currently at 6L wean as able  -pulmonary toilet  -ivfs   Hyponatremia  -appears hypovolemic -126 one year ago 140  -check serum osmo, urine na  - trial ivfs    Hyperglycemia -check a1c  -place on finger sticks   Hx of Substance abuse - check UDS   GERD -ppi   RA Fibromyalgia -resume home regimen once med rec completed    Tobacco abuse -tobacco education ordered    H/o drug abuse (cocaine abuse) -follows with substance abuse clinic    anxiety /depression -resume home regimen     AECOPD -due to PNA -resume home controller medication once med rec completed  -prednisone 40 mg po x 5 days  - duoneb standing, prn albuterol  -vbg 7.41/45  DVT prophylaxis: ppi Code Status: full Family Communication: none at bedside Disposition Plan: patient  expected to be admitted greater than 2 midnights  Consults called:  Admission status: PCU   Lurline Del MD [facility]  If 7PM-7AM, please contact night-coverage [email or web address] Password [facility]  07/17/2022, 11:29 PM

## 2022-07-17 NOTE — Progress Notes (Signed)
PHARMACY -  BRIEF ANTIBIOTIC NOTE   Pharmacy has received consult(s) for Vancomycin, Cefepime from an ED provider.  The patient's profile has been reviewed for ht/wt/allergies/indication/available labs.    One time order(s) placed for Vancomycin 1 gm IV X 1 and Cefepime 2 gm IV X 1.   Further antibiotics/pharmacy consults should be ordered by admitting physician if indicated.                       Thank you, Anali Cabanilla D 07/17/2022  11:36 PM

## 2022-07-18 ENCOUNTER — Inpatient Hospital Stay: Payer: Medicare Other

## 2022-07-18 DIAGNOSIS — A403 Sepsis due to Streptococcus pneumoniae: Secondary | ICD-10-CM | POA: Diagnosis present

## 2022-07-18 DIAGNOSIS — F1310 Sedative, hypnotic or anxiolytic abuse, uncomplicated: Secondary | ICD-10-CM | POA: Diagnosis present

## 2022-07-18 DIAGNOSIS — Z9842 Cataract extraction status, left eye: Secondary | ICD-10-CM | POA: Diagnosis not present

## 2022-07-18 DIAGNOSIS — E43 Unspecified severe protein-calorie malnutrition: Secondary | ICD-10-CM | POA: Diagnosis present

## 2022-07-18 DIAGNOSIS — F17210 Nicotine dependence, cigarettes, uncomplicated: Secondary | ICD-10-CM | POA: Diagnosis present

## 2022-07-18 DIAGNOSIS — E872 Acidosis, unspecified: Secondary | ICD-10-CM | POA: Diagnosis present

## 2022-07-18 DIAGNOSIS — M4856XA Collapsed vertebra, not elsewhere classified, lumbar region, initial encounter for fracture: Secondary | ICD-10-CM | POA: Diagnosis present

## 2022-07-18 DIAGNOSIS — J189 Pneumonia, unspecified organism: Secondary | ICD-10-CM | POA: Diagnosis present

## 2022-07-18 DIAGNOSIS — J432 Centrilobular emphysema: Secondary | ICD-10-CM | POA: Diagnosis present

## 2022-07-18 DIAGNOSIS — Z825 Family history of asthma and other chronic lower respiratory diseases: Secondary | ICD-10-CM | POA: Diagnosis not present

## 2022-07-18 DIAGNOSIS — J9601 Acute respiratory failure with hypoxia: Secondary | ICD-10-CM | POA: Diagnosis present

## 2022-07-18 DIAGNOSIS — W19XXXA Unspecified fall, initial encounter: Secondary | ICD-10-CM | POA: Diagnosis present

## 2022-07-18 DIAGNOSIS — M069 Rheumatoid arthritis, unspecified: Secondary | ICD-10-CM | POA: Diagnosis present

## 2022-07-18 DIAGNOSIS — F419 Anxiety disorder, unspecified: Secondary | ICD-10-CM | POA: Diagnosis present

## 2022-07-18 DIAGNOSIS — R739 Hyperglycemia, unspecified: Secondary | ICD-10-CM | POA: Diagnosis present

## 2022-07-18 DIAGNOSIS — M797 Fibromyalgia: Secondary | ICD-10-CM | POA: Diagnosis present

## 2022-07-18 DIAGNOSIS — T380X5A Adverse effect of glucocorticoids and synthetic analogues, initial encounter: Secondary | ICD-10-CM | POA: Diagnosis present

## 2022-07-18 DIAGNOSIS — Z681 Body mass index (BMI) 19 or less, adult: Secondary | ICD-10-CM | POA: Diagnosis not present

## 2022-07-18 DIAGNOSIS — R300 Dysuria: Secondary | ICD-10-CM | POA: Diagnosis present

## 2022-07-18 DIAGNOSIS — E861 Hypovolemia: Secondary | ICD-10-CM | POA: Diagnosis present

## 2022-07-18 DIAGNOSIS — Z20822 Contact with and (suspected) exposure to covid-19: Secondary | ICD-10-CM | POA: Diagnosis present

## 2022-07-18 DIAGNOSIS — F32A Depression, unspecified: Secondary | ICD-10-CM | POA: Diagnosis present

## 2022-07-18 DIAGNOSIS — R6521 Severe sepsis with septic shock: Secondary | ICD-10-CM | POA: Diagnosis present

## 2022-07-18 DIAGNOSIS — K219 Gastro-esophageal reflux disease without esophagitis: Secondary | ICD-10-CM | POA: Diagnosis present

## 2022-07-18 DIAGNOSIS — E871 Hypo-osmolality and hyponatremia: Secondary | ICD-10-CM | POA: Diagnosis present

## 2022-07-18 LAB — CBC
HCT: 30.3 % — ABNORMAL LOW (ref 36.0–46.0)
HCT: 29.5 % — ABNORMAL LOW (ref 36.0–46.0)
Hemoglobin: 10.5 g/dL — ABNORMAL LOW (ref 12.0–15.0)
Hemoglobin: 10.2 g/dL — ABNORMAL LOW (ref 12.0–15.0)
MCH: 33.4 pg (ref 26.0–34.0)
MCH: 33.4 pg (ref 26.0–34.0)
MCHC: 34.7 g/dL (ref 30.0–36.0)
MCHC: 34.6 g/dL (ref 30.0–36.0)
MCV: 96.5 fL (ref 80.0–100.0)
MCV: 96.7 fL (ref 80.0–100.0)
Platelets: 192 10*3/uL (ref 150–400)
Platelets: 190 10*3/uL (ref 150–400)
RBC: 3.14 MIL/uL — ABNORMAL LOW (ref 3.87–5.11)
RBC: 3.05 MIL/uL — ABNORMAL LOW (ref 3.87–5.11)
RDW: 13 % (ref 11.5–15.5)
RDW: 13.1 % (ref 11.5–15.5)
WBC: 21.3 10*3/uL — ABNORMAL HIGH (ref 4.0–10.5)
WBC: 18.5 10*3/uL — ABNORMAL HIGH (ref 4.0–10.5)
nRBC: 0 % (ref 0.0–0.2)
nRBC: 0 % (ref 0.0–0.2)

## 2022-07-18 LAB — MRSA NEXT GEN BY PCR, NASAL: MRSA by PCR Next Gen: DETECTED — AB

## 2022-07-18 LAB — BLOOD CULTURE ID PANEL (REFLEXED) - BCID2

## 2022-07-18 LAB — URINE DRUG SCREEN, QUALITATIVE (ARMC ONLY)
Amphetamines, Ur Screen: NOT DETECTED
Barbiturates, Ur Screen: NOT DETECTED
Benzodiazepine, Ur Scrn: POSITIVE — AB
Cannabinoid 50 Ng, Ur ~~LOC~~: NOT DETECTED
Cocaine Metabolite,Ur ~~LOC~~: NOT DETECTED
MDMA (Ecstasy)Ur Screen: NOT DETECTED
Methadone Scn, Ur: NOT DETECTED
Opiate, Ur Screen: NOT DETECTED
Phencyclidine (PCP) Ur S: NOT DETECTED
Tricyclic, Ur Screen: POSITIVE — AB

## 2022-07-18 LAB — LACTIC ACID, PLASMA
Lactic Acid, Venous: 1.3 mmol/L (ref 0.5–1.9)
Lactic Acid, Venous: 1.2 mmol/L (ref 0.5–1.9)

## 2022-07-18 LAB — RESPIRATORY PANEL BY PCR

## 2022-07-18 LAB — TSH: TSH: 1.157 u[IU]/mL (ref 0.350–4.500)

## 2022-07-18 LAB — COMPREHENSIVE METABOLIC PANEL
ALT: 14 U/L (ref 0–44)
AST: 21 U/L (ref 15–41)
Albumin: 1.8 g/dL — ABNORMAL LOW (ref 3.5–5.0)
Alkaline Phosphatase: 77 U/L (ref 38–126)
Anion gap: 9 (ref 5–15)
BUN: 18 mg/dL (ref 8–23)
CO2: 25 mmol/L (ref 22–32)
Calcium: 7.7 mg/dL — ABNORMAL LOW (ref 8.9–10.3)
Chloride: 97 mmol/L — ABNORMAL LOW (ref 98–111)
Creatinine, Ser: 0.58 mg/dL (ref 0.44–1.00)
GFR, Estimated: >60 mL/min (ref >60)
Glucose, Bld: 145 mg/dL — ABNORMAL HIGH (ref 70–99)
Potassium: 2.9 mmol/L — ABNORMAL LOW (ref 3.5–5.1)
Sodium: 131 mmol/L — ABNORMAL LOW (ref 135–145)
Total Bilirubin: 1 mg/dL (ref 0.3–1.2)
Total Protein: 5.1 g/dL — ABNORMAL LOW (ref 6.5–8.1)

## 2022-07-18 LAB — URINALYSIS, COMPLETE (UACMP) WITH MICROSCOPIC
Bilirubin Urine: NEGATIVE
Glucose, UA: NEGATIVE mg/dL
Hgb urine dipstick: NEGATIVE
Ketones, ur: NEGATIVE mg/dL
Leukocytes,Ua: NEGATIVE
Nitrite: NEGATIVE
Protein, ur: NEGATIVE mg/dL
Specific Gravity, Urine: 1.02 (ref 1.005–1.030)
pH: 6 (ref 5.0–8.0)

## 2022-07-18 LAB — PROCALCITONIN: Procalcitonin: 3.24 ng/mL

## 2022-07-18 LAB — CREATININE, SERUM
Creatinine, Ser: 0.61 mg/dL (ref 0.44–1.00)
GFR, Estimated: >60 mL/min (ref >60)

## 2022-07-18 LAB — D-DIMER, QUANTITATIVE: D-Dimer, Quant: 5.66 ug/mL-FEU — ABNORMAL HIGH (ref 0.00–0.50)

## 2022-07-18 LAB — OSMOLALITY: Osmolality: 275 mOsm/kg (ref 275–295)

## 2022-07-18 LAB — STREP PNEUMONIAE URINARY ANTIGEN: Strep Pneumo Urinary Antigen: NEGATIVE

## 2022-07-18 LAB — HIV ANTIBODY (ROUTINE TESTING W REFLEX): HIV Screen 4th Generation wRfx: NONREACTIVE

## 2022-07-18 LAB — HEMOGLOBIN A1C
Hgb A1c MFr Bld: 5.1 % (ref 4.8–5.6)
Mean Plasma Glucose: 99.67 mg/dL

## 2022-07-18 LAB — TROPONIN I (HIGH SENSITIVITY): Troponin I (High Sensitivity): 13 ng/L (ref <18)

## 2022-07-18 MED ORDER — ADULT MULTIVITAMIN W/MINERALS CH
1.0000 | ORAL_TABLET | Freq: Every day | ORAL | Status: DC
  Administered 2022-07-18 – 2022-07-23 (×6): 1 via ORAL
  Filled 2022-07-18 (×6): qty 1

## 2022-07-18 MED ORDER — ONDANSETRON HCL 4 MG/2ML IJ SOLN
4.0000 mg | Freq: Four times a day (QID) | INTRAMUSCULAR | Status: DC | PRN
  Administered 2022-07-22: 4 mg via INTRAVENOUS
  Filled 2022-07-18 (×3): qty 2

## 2022-07-18 MED ORDER — POTASSIUM CHLORIDE CRYS ER 20 MEQ PO TBCR
40.0000 meq | EXTENDED_RELEASE_TABLET | Freq: Once | ORAL | Status: AC
Start: 2022-07-18 — End: 2022-07-18
  Administered 2022-07-18: 40 meq via ORAL
  Filled 2022-07-18: qty 2

## 2022-07-18 MED ORDER — ALPRAZOLAM 0.25 MG PO TABS
0.2500 mg | ORAL_TABLET | Freq: Three times a day (TID) | ORAL | Status: DC | PRN
Start: 2022-07-18 — End: 2022-07-23
  Administered 2022-07-19 – 2022-07-23 (×13): 0.25 mg via ORAL
  Filled 2022-07-18 (×15): qty 1

## 2022-07-18 MED ORDER — ENOXAPARIN SODIUM 30 MG/0.3ML IJ SOSY
30.0000 mg | PREFILLED_SYRINGE | INTRAMUSCULAR | Status: DC
  Administered 2022-07-18 – 2022-07-22 (×5): 30 mg via SUBCUTANEOUS
  Filled 2022-07-18 (×5): qty 0.3

## 2022-07-18 MED ORDER — SODIUM CHLORIDE 0.9 % IV SOLN
500.0000 mg | INTRAVENOUS | Status: DC
  Administered 2022-07-18 – 2022-07-19 (×2): 500 mg via INTRAVENOUS
  Filled 2022-07-18: qty 5

## 2022-07-18 MED ORDER — POTASSIUM CHLORIDE IN NACL 20-0.9 MEQ/L-% IV SOLN
INTRAVENOUS | Status: DC
  Filled 2022-07-18 (×4): qty 1000

## 2022-07-18 MED ORDER — ENSURE ENLIVE PO LIQD
237.0000 mL | Freq: Three times a day (TID) | ORAL | Status: DC
  Administered 2022-07-18 – 2022-07-23 (×12): 237 mL via ORAL

## 2022-07-18 MED ORDER — IPRATROPIUM-ALBUTEROL 0.5-2.5 (3) MG/3ML IN SOLN
3.0000 mL | Freq: Four times a day (QID) | RESPIRATORY_TRACT | Status: DC
  Administered 2022-07-18 – 2022-07-20 (×11): 3 mL via RESPIRATORY_TRACT
  Filled 2022-07-18 (×11): qty 3

## 2022-07-18 MED ORDER — PREDNISONE 20 MG PO TABS
40.0000 mg | ORAL_TABLET | Freq: Every day | ORAL | Status: AC
  Administered 2022-07-18 – 2022-07-22 (×5): 40 mg via ORAL
  Filled 2022-07-18 (×5): qty 2

## 2022-07-18 MED ORDER — ALBUTEROL SULFATE (2.5 MG/3ML) 0.083% IN NEBU: 2.5000 mg | INHALATION_SOLUTION | RESPIRATORY_TRACT | Status: DC | PRN

## 2022-07-18 MED ORDER — SODIUM CHLORIDE 0.9 % IV BOLUS
500.0000 mL | Freq: Once | INTRAVENOUS | Status: AC
Start: 2022-07-18 — End: 2022-07-18
  Administered 2022-07-18: 500 mL via INTRAVENOUS

## 2022-07-18 MED ORDER — ACETAMINOPHEN 325 MG PO TABS
650.0000 mg | ORAL_TABLET | Freq: Four times a day (QID) | ORAL | Status: DC | PRN
  Administered 2022-07-19 – 2022-07-23 (×7): 650 mg via ORAL
  Filled 2022-07-18 (×7): qty 2

## 2022-07-18 MED ORDER — METHYLPREDNISOLONE SODIUM SUCC 125 MG IJ SOLR
125.0000 mg | Freq: Once | INTRAMUSCULAR | Status: AC
Start: 2022-07-18 — End: 2022-07-18
  Administered 2022-07-18: 125 mg via INTRAVENOUS
  Filled 2022-07-18: qty 2

## 2022-07-18 MED ORDER — HEPARIN SODIUM (PORCINE) 5000 UNIT/ML IJ SOLN
5000.0000 [IU] | Freq: Three times a day (TID) | INTRAMUSCULAR | Status: DC
Start: 2022-07-18 — End: 2022-07-18
  Administered 2022-07-18 (×2): 5000 [IU] via SUBCUTANEOUS
  Filled 2022-07-18 (×2): qty 1

## 2022-07-18 MED ORDER — IOHEXOL 350 MG/ML SOLN
75.0000 mL | Freq: Once | INTRAVENOUS | Status: AC | PRN
  Administered 2022-07-18: 75 mL via INTRAVENOUS

## 2022-07-18 MED ORDER — MIDODRINE HCL 5 MG PO TABS
5.0000 mg | ORAL_TABLET | Freq: Once | ORAL | Status: AC
  Administered 2022-07-18: 5 mg via ORAL
  Filled 2022-07-18: qty 1

## 2022-07-18 MED ORDER — SODIUM CHLORIDE 0.9 % IV SOLN
2.0000 g | INTRAVENOUS | Status: DC
  Administered 2022-07-18 – 2022-07-21 (×4): 2 g via INTRAVENOUS
  Filled 2022-07-18 (×3): qty 20
  Filled 2022-07-18: qty 2

## 2022-07-18 MED ORDER — ONDANSETRON HCL 4 MG PO TABS
4.0000 mg | ORAL_TABLET | Freq: Four times a day (QID) | ORAL | Status: DC | PRN
  Administered 2022-07-22 – 2022-07-23 (×4): 4 mg via ORAL
  Filled 2022-07-18 (×3): qty 1

## 2022-07-18 MED ORDER — ARFORMOTEROL TARTRATE 15 MCG/2ML IN NEBU
15.0000 ug | INHALATION_SOLUTION | Freq: Two times a day (BID) | RESPIRATORY_TRACT | Status: DC
  Administered 2022-07-18 – 2022-07-19 (×2): 15 ug via RESPIRATORY_TRACT
  Filled 2022-07-18 (×4): qty 2

## 2022-07-18 MED ORDER — SODIUM CHLORIDE 0.9 % IV SOLN: INTRAVENOUS | Status: DC

## 2022-07-18 MED ORDER — ACETAMINOPHEN 650 MG RE SUPP: 650.0000 mg | Freq: Four times a day (QID) | RECTAL | Status: DC | PRN

## 2022-07-18 MED ORDER — GUAIFENESIN ER 600 MG PO TB12
600.0000 mg | ORAL_TABLET | Freq: Two times a day (BID) | ORAL | Status: DC
Start: 2022-07-18 — End: 2022-07-23
  Administered 2022-07-18 – 2022-07-23 (×12): 600 mg via ORAL
  Filled 2022-07-18 (×12): qty 1

## 2022-07-18 MED ORDER — BUDESONIDE 0.25 MG/2ML IN SUSP
0.2500 mg | Freq: Two times a day (BID) | RESPIRATORY_TRACT | Status: DC
  Administered 2022-07-18 – 2022-07-20 (×5): 0.25 mg via RESPIRATORY_TRACT
  Filled 2022-07-18 (×6): qty 2

## 2022-07-18 MED ORDER — BENZONATATE 100 MG PO CAPS
200.0000 mg | ORAL_CAPSULE | Freq: Three times a day (TID) | ORAL | Status: DC | PRN
  Administered 2022-07-19 – 2022-07-22 (×5): 200 mg via ORAL
  Filled 2022-07-18 (×6): qty 2

## 2022-07-18 MED ORDER — PANTOPRAZOLE SODIUM 40 MG PO TBEC
40.0000 mg | DELAYED_RELEASE_TABLET | Freq: Every day | ORAL | Status: DC
  Administered 2022-07-18 – 2022-07-23 (×6): 40 mg via ORAL
  Filled 2022-07-18 (×6): qty 1

## 2022-07-18 NOTE — Progress Notes (Signed)
Initial Nutrition Assessment  DOCUMENTATION CODES:   Underweight, Severe malnutrition in context of chronic illness  INTERVENTION:   -Ensure Enlive po TID, each supplement provides 350 kcal and 20 grams of protein -MVI with minerals daily -Liberalize diet to regular for wider variety of meal selections  NUTRITION DIAGNOSIS:   Severe Malnutrition related to chronic illness (COPD) as evidenced by severe fat depletion, severe muscle depletion.  GOAL:   Patient will meet greater than or equal to 90% of their needs  MONITOR:   PO intake, Supplement acceptance  REASON FOR ASSESSMENT:   Consult Assessment of nutrition requirement/status  ASSESSMENT:   Pt with medical history significant of GERD,RA,Fibromyalgia,  tobacco abuse, H/o drug abuse (cocaine abuse), anxiety , depression, COPD who presents with one week of cough /sob and generalized weakness.  Pt admitted with CAP.   Spoke with pt at bedside, who reports feeling stressed due to being in the hospital. Pt is upset that her walker that she brought from home is still in the ED. Due to this, it was difficult to obtain a detailed history from her.   Per pt, she lost her house over the past few months and has been stressed due to this. She shares that she is living with a family member and does not like this arrangement. Pt shares that she has lost about 50 pounds over the past few months secondary to this stress. Reviewed wt hx; no wt loss noted over the past 6 months.   Per pt, she consumed a fruit cup and an Ensure for lunch today. Noted that there was another Ensure on her tray table; RD offered to open it and put it on ice, however, pt refused. RD provided pt with a cup of ice water per her request.   Discussed importance of good meal and supplement intake to promote healing.   Medications reviewed and include 0.9% NaCl with KCl 20 mEq/L infusion @ 100 ml/hr.   Labs reviewed: Na: 131, K: 2.9.   NUTRITION - FOCUSED  PHYSICAL EXAM:  Flowsheet Row Most Recent Value  Orbital Region Severe depletion  Upper Arm Region Severe depletion  Thoracic and Lumbar Region Severe depletion  Buccal Region Severe depletion  Temple Region Severe depletion  Clavicle Bone Region Severe depletion  Clavicle and Acromion Bone Region Severe depletion  Scapular Bone Region Severe depletion  Dorsal Hand Severe depletion  Patellar Region Severe depletion  Anterior Thigh Region Severe depletion  Posterior Calf Region Severe depletion  Edema (RD Assessment) None  Hair Reviewed  Eyes Reviewed  Mouth Reviewed  Skin Reviewed  Nails Reviewed       Diet Order:   Diet Order             Diet heart healthy/carb modified Room service appropriate? Yes with Assist; Fluid consistency: Thin  Diet effective now                   EDUCATION NEEDS:   Education needs have been addressed  Skin:  Skin Assessment: Reviewed RN Assessment  Last BM:  Unknown  Height:   Ht Readings from Last 1 Encounters:  07/17/22 5\' 1"  (1.549 m)    Weight:   Wt Readings from Last 1 Encounters:  07/17/22 43.1 kg    Ideal Body Weight:  47.7 kg  BMI:  Body mass index is 17.95 kg/m.  Estimated Nutritional Needs:   Kcal:  1700-1900  Protein:  90-105 grams  Fluid:  > 1.7 L  [name], RD, LDN, [place] Registered Dietitian II Certified Diabetes Care and Education Specialist Please refer to [facility] for RD and/or RD on-call/weekend/after hours pager

## 2022-07-18 NOTE — Sepsis Progress Note (Signed)
Following for sepsis monitoring ?

## 2022-07-18 NOTE — Evaluation (Signed)
Clinical/Bedside Swallow Evaluation Patient Details  Name: Vanessa Oneal MRN: [id number] Date of Birth: 05-07-1951  Today's Date: 07/18/2022 Time: SLP Start Time (ACUTE ONLY): 0935 SLP Stop Time (ACUTE ONLY): 1030 SLP Time Calculation (min) (ACUTE ONLY): 55 min  Past Medical History:  Past Medical History:  Diagnosis Date   Arthritis    RA   Depression    Fibromyalgia    GERD (gastroesophageal reflux disease)    H/O drug dependence (HCC)    opiods   Wears dentures    full upper   Past Surgical History:  Past Surgical History:  Procedure Laterality Date   BREAST SURGERY     Silicone implants, then removal   CATARACT EXTRACTION W/PHACO Left 05/06/2019   Procedure: CATARACT EXTRACTION PHACO AND INTRAOCULAR LENS PLACEMENT (IOC) LEFT;  Surgeon: Galen Manila, MD;  Location: [facility];  Service: Ophthalmology;  Laterality: Left;   TUBAL LIGATION     HPI:  71 y.o. female with medical history significant of GERD, RA, Fibromyalgia,  tobacco abuse, H/o drug abuse (cocaine abuse), anxiety , depression, COPD who presents to ed with one week of cough /sob and generalized weakness.  Per patient she has fall 5 days ago and after that note pain on right side with coughing.  She notes sob and coughing has progressed since then.  She also endorse weight loss on ROS, which she states as been ongoing for months.  She also decrease feeling of early satiety and reported to SLP that she has Retrograde Reflux activity at home.  Pt stated she drinks 2-3 Ensure daily at home.     CT Angio of Chest: No evidence of pulmonary embolism.  2. Extensive right upper lobe, right middle lobe and right lower  lobe infiltrates. Follow-up to resolution is recommended, as an  underlying neoplastic process cannot be excluded.  3. Marked severity emphysematous lung disease.      Assessment / Plan / Recommendation  Clinical Impression   Pt seen for BSE this morning. Pt and NSG denied any swallowing issues  during breakfast meal. Pt awake, alert; engaged easily w/ this SLP. On [place] O2 support, 4L.   Pt appears to present w/ adequate oropharyngeal phase swallowing function w/ No overt oropharyngeal phase dysphagia appreciated w/ oral intake; No neuromuscular swallowing deficits appreciated. Pt appears at reduced risk for aspiration from an oropharyngeal phase standpoint following general aspiration precautions. HOWEVER, pt has a baseline of GERD/REFLUX, on/off a PPI at home per her report. She reported a h/o Esophageal Dilation "years ago"; early satiety and drinking Ensure much of the day. ANY Regurgitation of Reflux material can increase risk for aspiration of the Reflux material during Retrograde backflow thus impact Pulmonary status. Pt currently has right lobe pneumonia and has been more sedentary since recent fall on right side, pain.  Pt described ongoing issues w/ "the food coming back up" pointing to mid sternum area then described the discomfort moves superiorly pointing to her Sternal Notch area. She feels this impacts her ability to take much at a meal.   Pt sat upright in bed this evaluation and consumed trials of thin liquids Via Cup/straw(few), then purees and softened solids (no bottom dentition, upper denture plate only) w/ no immediate, overt clinical s/s of aspiration noted; clear vocal quality b/t trials, no decline in pulmonary status, no multiple swallows noted post initial pharyngeal swallow. O2 sats remained low-mid 90s. Rest Breaks b/t trials were taken to lessen any WOB/SOB.  Oral phase appeared grossly [facility] for  bolus management and timely A-P transfer/clearing of material. Mastication was less effective d/t No bottom dentition but moistening the foods well aided in mashing/gumming them for swallowing. Alternated bites/sips. OM exam was [facility] w/ no unilateral lingual/labial weakness/movements. Speech clear.   Of Note, pt demonstrated tolerance for the po trials given w/ Rest Breaks and Time  b/t but stated "it's not always like this at home". She endorsed Esophageal phase dysmotility and discomfort w/ Retrograde flow "up to my throat and out of my mouth" intermittently.   Recommend a more Mech Soft diet (w/ less meats/breads in diet), moistened foods. Thin liquids via Cup/less straw use d/t air swallowing; general aspiration precautions. Rest Breaks during meals/oral intake to allow for Esophageal clearing. REFLUX precautions strongly recommended to lessen chance for Regurgitation.   Recommend pt f/u w/ GI for management of GERD/REFLUX and possible r/o of Esophageal stricture, dysmotility; tx as indicated. Much education and discussion re: foods/consistencies; aspiration and REFLUX precautions; Pills in Puree for safer swallowing. Handouts given on REFLUX. MD to reconsult ST services if any new needs while admitted. NSG updated.  SLP Visit Diagnosis: Dysphagia, unspecified (R13.10) (suspect Esophageal phase dysmotility impacting)    Aspiration Risk   (reduced from an oropharyngeal phase but increased from an Esophageal phase)    Diet Recommendation   a more Mech Soft diet (w/ less meats/breads in diet), moistened foods. Thin liquids via Cup/less straw use d/t air swallowing; general aspiration precautions. Rest Breaks during meals/oral intake to allow for Esophageal clearing. REFLUX precautions strongly recommended to lessen chance for Regurgitation.  Medication Administration: Whole meds with puree (for safer swallowing)    Other  Recommendations Recommended Consults: Consider GI evaluation;Consider esophageal assessment (Dietician f/u) Oral Care Recommendations: Oral care BID;Oral care before and after PO;Patient independent with oral care (Denture care) Other Recommendations:  (n/a)    Recommendations for follow up therapy are one component of a multi-disciplinary discharge planning process, led by the attending physician.  Recommendations may be updated based on patient status,  additional functional criteria and insurance authorization.  Follow up Recommendations No SLP follow up      Assistance Recommended at Discharge None  Functional Status Assessment Patient has had a recent decline in their functional status and demonstrates the ability to make significant improvements in function in a reasonable and predictable amount of time.  Frequency and Duration  (n/a)   (n/a)       Prognosis Prognosis for Safe Diet Advancement: Fair Barriers to Reach Goals: Time post onset;Severity of deficits Barriers/Prognosis Comment: GERD and Esophageal dysmotility baseline      Swallow Study   General Date of Onset: 07/17/22 HPI: 71 y.o. female with medical history significant of GERD, RA, Fibromyalgia,  tobacco abuse, H/o drug abuse (cocaine abuse), anxiety , depression, COPD who presents to ed with one week of cough /sob and generalized weakness.  Per patient she has fall 5 days ago and after that note pain on right side with coughing.  She notes sob and coughing has progressed since then.  She also endorse weight loss on ROS, which she states as been ongoing for months.  She also decrease feeling of early satiety and reported to SLP that she has Retrograde Reflux activity at home.  Pt stated she drinks 2-3 Ensure daily at home.   CT Angio of Chest: No evidence of pulmonary embolism.  2. Extensive right upper lobe, right middle lobe and right lower  lobe infiltrates. Follow-up to resolution is recommended, as an  underlying neoplastic process cannot be excluded.  3. Marked severity emphysematous lung disease. Type of Study: Bedside Swallow Evaluation Previous Swallow Assessment: none Diet Prior to this Study: Regular;Thin liquids Temperature Spikes Noted: No (wbc elevated) Respiratory Status: Nasal cannula (4L) History of Recent Intubation: No Behavior/Cognition: Alert;Cooperative;Pleasant mood Oral Cavity Assessment: Within Functional Limits Oral Care Completed by SLP:  Recent completion by staff (upper Dentures only) Oral Cavity - Dentition: Dentures, top (no bottom dentition) Vision: Functional for self-feeding Self-Feeding Abilities: Able to feed self Patient Positioning: Upright in bed Baseline Vocal Quality: Normal Volitional Cough: Strong Volitional Swallow: Able to elicit    Oral/Motor/Sensory Function Overall Oral Motor/Sensory Function: Within functional limits   Ice Chips Ice chips: Within functional limits Presentation: Spoon (fed; 2 trials)   Thin Liquid Thin Liquid: Within functional limits Presentation: Cup;Self Fed;Straw (single sips; 10+)    Nectar Thick Nectar Thick Liquid: Not tested   Honey Thick Honey Thick Liquid: Not tested   Puree Puree: Within functional limits Presentation: Spoon;Self Fed (8 trials)   Solid     Solid: Impaired (mastication) Presentation: Self Fed;Spoon (8 trials) Oral Phase Impairments: Impaired mastication (missing bottom dentition) Pharyngeal Phase Impairments:  (none) Other Comments: moistened trials well         Jerilynn Som, MS, CCC-SLP Speech Language Pathologist Rehab Services; [facility] [place] [phone] (ascom) Kenedy Haisley 07/18/2022,4:29 PM

## 2022-07-18 NOTE — Progress Notes (Addendum)
PROGRESS NOTE    Vanessa Oneal  EHM:[id number] DOB: 1951-03-01 DOA: 07/17/2022 PCP: Valerie Roys, DO    Brief Narrative:  71 y.o. female with medical history significant of GERD,RA,Fibromyalgia,  tobacco abuse, H/o drug abuse (cocaine abuse), anxiety , depression, COPD who presents to ed with one week of cough /sob and generalized weakness. Patient BIB EMS , of note in the field patient was noted to have saturation of 80% on RA and poc blood glucose of 248. Per patient she has fall 5 days ago and after that note pain on right side with coughing. She notes sob and coughing has progressed since then.   Chest CT with extensive right upper middle and lower lobe infiltrates.  Underlying neoplastic process cannot be excluded.  Marked severity emphysematous lung disease background.   Assessment & Plan:   Principal Problem:   CAP (community acquired pneumonia) Active Problems:   Anxiety disorder   Centrilobular emphysema (HCC)   Malnutrition ([place])   Benzodiazepine abuse ([place])   Compression fracture of third lumbar vertebra (HCC)  Community-acquired pneumonia Acute hypoxic respiratory failure Severe sepsis secondary to above Sepsis criteria met with leukocytosis, fever, tachypnea, lactic acidosis Initially on 6 L, weaned to 4 Plan: Continue current antibiotic therapy with ceftriaxone and azithromycin Follow blood urine and sputum cultures Continue supplemental oxygen, wean as tolerated Pulmonary toileting, encourage I-S and flutter valve use Monitor vitals and fever curve Oxygen as tolerated  Acute exacerbation of COPD Marked emphysematous changes noted on imaging Plan: P.o. prednisone 40 mg daily x5 days Every 6 hours DuoNebs Twice daily Brovana Twice daily Pulmicort Sepsis treatment as above  Hyponatremia Appears hypovolemic Supplemental intravenous fluids Trend sodium  Hyperglycemia A1c 5.1, nondiabetic No known history of diabetes DC fingersticks  Suspected  severe malnutrition RD consult requested  Rheumatoid arthritis Fibromyalgia Unclear if patient actually takes medications for this.  None listed on her home medication list  Tobacco use Counseled patient on cessation  History of drug use UDS positive for benzodiazepines and tricyclic's  Anxiety/depression No SSRIs or antidepressants listed UDS positive for benzodiazepines We will start as needed Xanax  GERD PPI  DVT prophylaxis: SQ Lovenox Code Status: Full.  Had a lengthy discussion with patient regarding her CODE STATUS.  Currently she confirms full code.  If clinical status deteriorates consider consultation with palliative care and further discussion regarding [place] Family Communication: None today.  Offered to call but patient declined.  Patient's contact is a granddaughter Designer, multimedia however no available phone number for her Disposition Plan: Status is: Inpatient Remains inpatient appropriate because: Severe COPD.  Community-acquired pneumonia on IV antibiotics.   Level of care: Progressive  Consultants:  None  Procedures:  None  Antimicrobials: Ceftriaxone Azithromycin   Subjective: Seen and examined.  Reports improvement in respiratory status since admission.  Is on 4 L nasal cannula at time of my evaluation.  Objective: Vitals:   07/18/22 0700 07/18/22 0715 07/18/22 0900 07/18/22 1200  BP: 92/60 (!) '89/63 92/65 95/62 '  Pulse: 78 78 82 72  Resp: '19 13 16 18  ' Temp:   98.7 F (37.1 C) 98.5 F (36.9 C)  TempSrc:   Oral Oral  SpO2: 97% 96% 98% 95%  Weight:      Height:       No intake or output data in the 24 hours ending 07/18/22 1337 Filed Weights   07/17/22 2201  Weight: 43.1 kg    Examination:  General exam: No acute distress.  Appears frail and  chronically ill Respiratory system: Diminished breath sounds bilaterally.  Scattered end expiratory wheeze.  Left-sided crackles.  Normal work of breathing.  4 L Cardiovascular system: S1-S2, RRR, no  murmurs, no pedal edema Gastrointestinal system: Thin/scaphoid, soft, NT/ND, normal bowel sounds 7 Central nervous system: Alert and oriented. No focal neurological deficits. Extremities: Good decreased power bilaterally.  Muscle wasting noted bilaterally Skin: Thin and pale with no obvious rashes or lesions Psychiatry: Judgement and insight appear normal. Mood & affect appropriate.     Data Reviewed: I have personally reviewed following labs and imaging studies  CBC: Recent Labs  Lab 07/17/22 2206 07/18/22 0112 07/18/22 0429  WBC 18.5* 21.3* 18.5*  NEUTROABS 16.7*  --   --   HGB 10.3* 10.5* 10.2*  HCT 29.5* 30.3* 29.5*  MCV 95.8 96.5 96.7  PLT 180 192 161   Basic Metabolic Panel: Recent Labs  Lab 07/17/22 2206 07/18/22 0112 07/18/22 0429  NA 126*  --  131*  K 3.0*  --  2.9*  CL 91*  --  97*  CO2 26  --  25  GLUCOSE 168*  --  145*  BUN 25*  --  18  CREATININE 0.71 0.61 0.58  CALCIUM 7.6*  --  7.7*   GFR: Estimated Creatinine Clearance: 44.5 mL/min (by C-G formula based on SCr of 0.58 mg/dL). Liver Function Tests: Recent Labs  Lab 07/17/22 2206 07/18/22 0429  AST 26 21  ALT 14 14  ALKPHOS 81 77  BILITOT 0.6 1.0  PROT 5.3* 5.1*  ALBUMIN 2.0* 1.8*   No results for input(s): "LIPASE", "AMYLASE" in the last 168 hours. No results for input(s): "AMMONIA" in the last 168 hours. Coagulation Profile: Recent Labs  Lab 07/17/22 2206  INR 1.2   Cardiac Enzymes: No results for input(s): "CKTOTAL", "CKMB", "CKMBINDEX", "TROPONINI" in the last 168 hours. BNP (last 3 results) No results for input(s): "PROBNP" in the last 8760 hours. HbA1C: Recent Labs    07/18/22 0112  HGBA1C 5.1   CBG: No results for input(s): "GLUCAP" in the last 168 hours. Lipid Profile: No results for input(s): "CHOL", "HDL", "LDLCALC", "TRIG", "CHOLHDL", "LDLDIRECT" in the last 72 hours. Thyroid Function Tests: Recent Labs    07/18/22 0429  TSH 1.157   Anemia Panel: No results for  input(s): "VITAMINB12", "FOLATE", "FERRITIN", "TIBC", "IRON", "RETICCTPCT" in the last 72 hours. Sepsis Labs: Recent Labs  Lab 07/17/22 2206 07/18/22 0112 07/18/22 0429  PROCALCITON 3.69  --  3.24  LATICACIDVEN 2.2* 1.3 1.2    Recent Results (from the past 240 hour(s))  Blood Culture (routine x 2)     Status: None (Preliminary result)   Collection Time: 07/17/22 10:06 PM   Specimen: BLOOD  Result Value Ref Range Status   Specimen Description BLOOD LEFT HAND  Final   Special Requests   Final    BOTTLES DRAWN AEROBIC AND ANAEROBIC Blood Culture results may not be optimal due to an inadequate volume of blood received in culture bottles   Culture   Final    NO GROWTH < 12 HOURS Performed at [facility], [address], [place], [place] [address]    Report Status PENDING  Incomplete  Blood Culture (routine x 2)     Status: None (Preliminary result)   Collection Time: 07/17/22 10:06 PM   Specimen: BLOOD  Result Value Ref Range Status   Specimen Description BLOOD RIGHT FOREARM  Final   Special Requests   Final    BOTTLES DRAWN AEROBIC AND ANAEROBIC  Blood Culture results may not be optimal due to an inadequate volume of blood received in culture bottles   Culture   Final    NO GROWTH < 12 HOURS Performed at [facility], [address] [place], [place] [address]    Report Status PENDING  Incomplete  Resp Panel by RT-PCR (Flu A&B, Covid) Anterior Nasal Swab     Status: None   Collection Time: 07/17/22 11:05 PM   Specimen: Anterior Nasal Swab  Result Value Ref Range Status   SARS Coronavirus 2 by RT PCR NEGATIVE NEGATIVE Final    Comment: (NOTE) SARS-CoV-2 target nucleic acids are NOT DETECTED.  The SARS-CoV-2 RNA is generally detectable in upper respiratory specimens during the acute phase of infection. The lowest concentration of SARS-CoV-2 viral copies this assay can detect is 138 copies/mL. A negative result does not preclude SARS-Cov-2 infection and  should not be used as the sole basis for treatment or other patient management decisions. A negative result may occur with  improper specimen collection/handling, submission of specimen other than nasopharyngeal swab, presence of viral mutation(s) within the areas targeted by this assay, and inadequate number of viral copies(<138 copies/mL). A negative result must be combined with clinical observations, patient history, and epidemiological information. The expected result is Negative.  Fact Sheet for Patients:  EntrepreneurPulse.com.au  Fact Sheet for Healthcare Providers:  IncredibleEmployment.be  This test is no t yet approved or cleared by the [place] FDA and  has been authorized for detection and/or diagnosis of SARS-CoV-2 by FDA under an Emergency Use Authorization (EUA). This EUA will remain  in effect (meaning this test can be used) for the duration of the COVID-19 declaration under Section 564(b)(1) of the Act, 21 U.S.C.section 360bbb-3(b)(1), unless the authorization is terminated  or revoked sooner.       Influenza A by PCR NEGATIVE NEGATIVE Final   Influenza B by PCR NEGATIVE NEGATIVE Final    Comment: (NOTE) The Xpert Xpress SARS-CoV-2/FLU/RSV plus assay is intended as an aid in the diagnosis of influenza from Nasopharyngeal swab specimens and should not be used as a sole basis for treatment. Nasal washings and aspirates are unacceptable for Xpert Xpress SARS-CoV-2/FLU/RSV testing.  Fact Sheet for Patients: EntrepreneurPulse.com.au  Fact Sheet for Healthcare Providers: IncredibleEmployment.be  This test is not yet approved or cleared by the [place] FDA and has been authorized for detection and/or diagnosis of SARS-CoV-2 by FDA under an Emergency Use Authorization (EUA). This EUA will remain in effect (meaning this test can be used) for the duration of the COVID-19 declaration  under Section 564(b)(1) of the Act, 21 U.S.C. section 360bbb-3(b)(1), unless the authorization is terminated or revoked.  Performed at [facility], [address] [place], [place] [address]   MRSA Next Gen by PCR, Nasal     Status: Abnormal   Collection Time: 07/18/22  1:12 AM   Specimen: Nasal Mucosa; Nasal Swab  Result Value Ref Range Status   MRSA by PCR Next Gen DETECTED (A) NOT DETECTED Final    Comment: RESULT CALLED TO, READ BACK BY AND VERIFIED WITH: VENESSA ASHLEY 07/18/22 0620 MW (NOTE) The GeneXpert MRSA Assay (FDA approved for NASAL specimens only), is one component of a comprehensive MRSA colonization surveillance program. It is not intended to diagnose MRSA infection nor to guide or monitor treatment for MRSA infections. Test performance is not FDA approved in patients less than 40 years old. Performed at [facility], [address] [place],  [address]  Respiratory (~20 pathogens) panel by PCR     Status: None   Collection Time: 07/18/22  4:29 AM   Specimen: Nasopharyngeal Swab; Respiratory  Result Value Ref Range Status   Adenovirus NOT DETECTED NOT DETECTED Final   Coronavirus 229E NOT DETECTED NOT DETECTED Final    Comment: (NOTE) The Coronavirus on the Respiratory Panel, DOES NOT test for the novel  Coronavirus (2019 nCoV)    Coronavirus HKU1 NOT DETECTED NOT DETECTED Final   Coronavirus NL63 NOT DETECTED NOT DETECTED Final   Coronavirus OC43 NOT DETECTED NOT DETECTED Final   Metapneumovirus NOT DETECTED NOT DETECTED Final   Rhinovirus / Enterovirus NOT DETECTED NOT DETECTED Final   Influenza A NOT DETECTED NOT DETECTED Final   Influenza B NOT DETECTED NOT DETECTED Final   Parainfluenza Virus 1 NOT DETECTED NOT DETECTED Final   Parainfluenza Virus 2 NOT DETECTED NOT DETECTED Final   Parainfluenza Virus 3 NOT DETECTED NOT DETECTED Final   Parainfluenza Virus 4 NOT DETECTED NOT DETECTED Final   Respiratory Syncytial Virus NOT  DETECTED NOT DETECTED Final   Bordetella pertussis NOT DETECTED NOT DETECTED Final   Bordetella Parapertussis NOT DETECTED NOT DETECTED Final   Chlamydophila pneumoniae NOT DETECTED NOT DETECTED Final   Mycoplasma pneumoniae NOT DETECTED NOT DETECTED Final    Comment: Performed at [facility], [place]. [address], [place], [place] [address]         Radiology Studies: CT Angio Chest Pulmonary Embolism (PE) W or WO Contrast  Result Date: 07/18/2022 CLINICAL DATA:  Cough and shortness of breath. EXAM: CT ANGIOGRAPHY CHEST WITH CONTRAST TECHNIQUE: Multidetector CT imaging of the chest was performed using the standard protocol during bolus administration of intravenous contrast. Multiplanar CT image reconstructions and MIPs were obtained to evaluate the vascular anatomy. RADIATION DOSE REDUCTION: This exam was performed according to the departmental dose-optimization program which includes automated exposure control, adjustment of the mA and/or kV according to patient size and/or use of iterative reconstruction technique. CONTRAST:  59m OMNIPAQUE IOHEXOL 350 MG/ML SOLN COMPARISON:  July 15, 2020 FINDINGS: Cardiovascular: There is mild calcification of the aortic arch, without evidence of aortic aneurysm. Satisfactory opacification of the pulmonary arteries to the segmental level. No evidence of pulmonary embolism. Normal heart size. No pericardial effusion. Mediastinum/Nodes: A 2.2 cm x 1.4 cm precarinal lymph node is seen. Mild AP window, pretracheal, subcarinal and right hilar lymphadenopathy is also noted. Thyroid gland, trachea, and esophagus demonstrate no significant findings. Lungs/Pleura: There is marked severity emphysematous lung disease. Extensive infiltrates and large areas of subsequent consolidation are seen within the right upper lobe, right middle lobe and right lower lobe. There is no evidence of a pleural effusion or pneumothorax. Upper Abdomen: No acute abnormality. Musculoskeletal:  No chest wall abnormality. No acute or significant osseous findings. Review of the MIP images confirms the above findings. IMPRESSION: 1. No evidence of pulmonary embolism. 2. Extensive right upper lobe, right middle lobe and right lower lobe infiltrates. Follow-up to resolution is recommended, as an underlying neoplastic process cannot be excluded. 3. Marked severity emphysematous lung disease. Aortic Atherosclerosis (ICD10-I70.0) and Emphysema (ICD10-J43.9). Electronically Signed   By: TVirgina NorfolkM.D.   On: 07/18/2022 02:01   DG Chest Port 1 View  Result Date: 07/17/2022 CLINICAL DATA:  Possible sepsis with hypoxia and recent fall EXAM: PORTABLE CHEST 1 VIEW COMPARISON:  07/15/2020 FINDINGS: Cardiac shadow is within normal limits. Lungs are hyperinflated consistent with COPD. Aortic calcifications are noted. Diffuse airspace opacity is noted  throughout the right lung consistent with acute pneumonia. No new focal abnormality is noted. IMPRESSION: Diffuse right-sided infiltrate consistent with pneumonia. Electronically Signed   By: Inez Catalina M.D.   On: 07/17/2022 22:47        Scheduled Meds:  arformoterol  15 mcg Nebulization BID   budesonide (PULMICORT) nebulizer solution  0.25 mg Nebulization BID   enoxaparin (LOVENOX) injection  30 mg Subcutaneous Q24H   feeding supplement  237 mL Oral TID BM   guaiFENesin  600 mg Oral BID   ipratropium-albuterol  3 mL Inhalation Q6H   pantoprazole  40 mg Oral Daily   predniSONE  40 mg Oral Q breakfast   Continuous Infusions:  0.9 % NaCl with KCl 20 mEq / L 100 mL/hr at 07/18/22 1042   azithromycin Stopped (07/18/22 0327)   cefTRIAXone (ROCEPHIN)  IV 2 g (07/18/22 0916)     LOS: 0 days    Sidney Ace, MD [facility]   If 7PM-7AM, please contact night-coverage  07/18/2022, 1:37 PM

## 2022-07-18 NOTE — Evaluation (Signed)
Physical Therapy Evaluation Patient Details Name: Vanessa Oneal MRN: [id number] DOB: 08/16/1951 Today's Date: 07/18/2022  History of Present Illness  71 y.o. female with medical history significant of GERD,RA,Fibromyalgia,  tobacco abuse, H/o drug abuse (cocaine abuse), anxiety , depression, COPD who presents to ed with one week of cough /sob and generalized weakness.  Clinical Impression  Pt laying in bed with some nervous sorting of personal materials but was ultimately able to participate relatively well.  Pt does not normally need O2 or AD, but definitely needed both this date.  Difficult to get accurate SpO2 reading t/o the session but she appeared to have numbers in the low/mid 90s much of the time.  Pt is not at her baseline, should not need STR but per progress and change from baseline currently recommending HHPT.       Recommendations for follow up therapy are one component of a multi-disciplinary discharge planning process, led by the attending physician.  Recommendations may be updated based on patient status, additional functional criteria and insurance authorization.  Follow Up Recommendations Home health PT      Assistance Recommended at Discharge Intermittent Supervision/Assistance  Patient can return home with the following  Two people to help with walking and/or transfers    Equipment Recommendations    Recommendations for Other Services       Functional Status Assessment       Precautions / Restrictions Restrictions Weight Bearing Restrictions: No      Mobility  Bed Mobility Overal bed mobility: Independent             General bed mobility comments: Pt able to get herself up to sitting w/o issue    Transfers Overall transfer level: Modified independent Equipment used: Rolling walker (2 wheels)               General transfer comment: Pt was able to rise to standing w/o assist, minimal UE reliance    Ambulation/Gait Ambulation/Gait  assistance: Supervision Gait Distance (Feet): 100 Feet Assistive device: Rolling walker (2 wheels)         General Gait Details: Pt able to maintain safe but slow and cautious gait.  She was not overly reliant on the walker but did not seem to be near her baseline w/o AD.  6L O2 during ambulation, some DOE that did not worsen with the effort, difficult to get appropriate/accurate SpO2 reading but appeared to stay in the low 80s t/o most of the effort.  Stairs            Wheelchair Mobility    Modified Rankin (Stroke Patients Only)       Balance Overall balance assessment: Modified Independent                                           Pertinent Vitals/Pain Pain Assessment Pain Assessment: 0-10 Pain Score: 5  Pain Location: rib pain from coughing R>L    Home Living Family/patient expects to be discharged to:: Private residence Living Arrangements: Other relatives Available Help at Discharge: Available 24 hours/day   Home Access: Stairs to enter   Entrance Stairs-Number of Steps: yes     Home Equipment: Agricultural consultant (2 wheels);Cane - single point      Prior Function Prior Level of Function : Independent/Modified Independent             Mobility Comments: Pt  reports that she normaly drives/runs errands, does not need AD ADLs Comments: reports being independent     Hand Dominance        Extremity/Trunk Assessment   Upper Extremity Assessment Upper Extremity Assessment: Overall WFL for tasks assessed;Generalized weakness    Lower Extremity Assessment Lower Extremity Assessment: Overall WFL for tasks assessed;Generalized weakness       Communication   Communication: No difficulties  Cognition Arousal/Alertness: Awake/alert Behavior During Therapy: WFL for tasks assessed/performed Overall Cognitive Status: Within Functional Limits for tasks assessed                                          General Comments  General comments (skin integrity, edema, etc.): Pt with some impulsivity sorting things out in her bed but ultimately moved with confidence  and relative safety    Exercises     Assessment/Plan    PT Assessment Patient needs continued PT services  PT Problem List Decreased strength;Decreased activity tolerance;Decreased balance;Decreased mobility;Decreased knowledge of use of DME;Decreased safety awareness       PT Treatment Interventions Gait training;Functional mobility training;Therapeutic activities;Therapeutic exercise;Balance training;DME instruction    PT Goals (Current goals can be found in the Care Plan section)  Acute Rehab PT Goals Patient Stated Goal: Go home PT Goal Formulation: With patient Time For Goal Achievement: 07/31/22 Potential to Achieve Goals: Fair    Frequency Min 2X/week     Co-evaluation               AM-PAC PT "6 Clicks" Mobility  Outcome Measure Help needed turning from your back to your side while in a flat bed without using bedrails?: None Help needed moving from lying on your back to sitting on the side of a flat bed without using bedrails?: None Help needed moving to and from a bed to a chair (including a wheelchair)?: A Little Help needed standing up from a chair using your arms (e.g., wheelchair or bedside chair)?: None Help needed to walk in hospital room?: A Lot Help needed climbing 3-5 steps with a railing? : A Lot 6 Click Score: 19    End of Session Equipment Utilized During Treatment: Gait belt;Oxygen (6L during ambulation) Activity Tolerance: Patient tolerated treatment well;Patient limited by fatigue Patient left: with call bell/phone within reach;in bed Nurse Communication: Mobility status PT Visit Diagnosis: Unsteadiness on feet (R26.81);Muscle weakness (generalized) (M62.81)    Time: 3299-2426 PT Time Calculation (min) (ACUTE ONLY): 17 min   Charges:   PT Evaluation $PT Eval Low Complexity: 1 Low PT  Treatments $Gait Training: 8-22 mins        Malachi Pro, DPT 07/18/2022, 12:35 PM

## 2022-07-18 NOTE — Progress Notes (Signed)
CODE SEPSIS - PHARMACY COMMUNICATION  **Broad Spectrum Antibiotics should be administered within 1 hour of Sepsis diagnosis**  Time Code Sepsis Called/Page Received:  8/14 @ 2329  Antibiotics Ordered: Cefepime , Vancomycin   Time of 1st antibiotic administration: Cefepime 2 gm IV X 1 on 8/14 @ 2345   Additional action taken by pharmacy:   If necessary, Name of Provider/Nurse Contacted:     Leahann Lempke D ,PharmD Clinical Pharmacist  07/18/2022  12:10 AM

## 2022-07-18 NOTE — Progress Notes (Signed)
       CROSS COVER NOTE  NAME: SHAKIRA LOS MRN: [id number] DOB : 08-Nov-1951    Date of Service   07/18/22  HPI/Events of Note   Notified by nursing of hypotension, BP 86/53 MAP 64. M(r)s Vassey has received 1.5L fluid bolus and was started on continuous IVF at 18mL/hr  Interventions   Plan: 500 mL NS bolus 5 mg Midodrine     This document was prepared using Dragon voice recognition software and may include unintentional dictation errors.  Bishop Limbo DNP, MHA, FNP-BC Nurse Practitioner [facility] Pager [phone]

## 2022-07-18 NOTE — ED Notes (Signed)
Spoke with Lab states ok to send urine and labels will divide appropriately for several urine studies.

## 2022-07-18 NOTE — ED Notes (Signed)
Pt resting on 5L [place] at 97% titrated down to 4L will monitor.

## 2022-07-18 NOTE — Progress Notes (Signed)
PHARMACY - PHYSICIAN COMMUNICATION CRITICAL VALUE ALERT - BLOOD CULTURE IDENTIFICATION (BCID)  Vanessa Oneal is an 71 y.o. female who presented to [facility] on 07/17/2022 with a chief complaint of community acquired pneumonia.  Assessment:  BCID S. Pneumoniae. Gram stain GPCs in 1/4 bottles (Aerobic), no resistance  Name of physician (or Provider) Contacted: Dr. Skip Mayer  Current antibiotics: ceftriaxone 2 grams every 24 hours and azithromycin 500 mg every 24 hours  Changes to prescribed antibiotics recommended:  Patient is on recommended antibiotics - No changes needed  Results for orders placed or performed during the hospital encounter of 07/17/22  Blood Culture ID Panel (Reflexed) (Collected: 07/17/2022 10:06 PM)  Result Value Ref Range   Enterococcus faecalis NOT DETECTED NOT DETECTED   Enterococcus Faecium NOT DETECTED NOT DETECTED   Listeria monocytogenes NOT DETECTED NOT DETECTED   Staphylococcus species NOT DETECTED NOT DETECTED   Staphylococcus aureus (BCID) NOT DETECTED NOT DETECTED   Staphylococcus epidermidis NOT DETECTED NOT DETECTED   Staphylococcus lugdunensis NOT DETECTED NOT DETECTED   Streptococcus species DETECTED (A) NOT DETECTED   Streptococcus agalactiae NOT DETECTED NOT DETECTED   Streptococcus pneumoniae DETECTED (A) NOT DETECTED   Streptococcus pyogenes NOT DETECTED NOT DETECTED   A.calcoaceticus-baumannii NOT DETECTED NOT DETECTED   Bacteroides fragilis NOT DETECTED NOT DETECTED   Enterobacterales NOT DETECTED NOT DETECTED   Enterobacter cloacae complex NOT DETECTED NOT DETECTED   Escherichia coli NOT DETECTED NOT DETECTED   Klebsiella aerogenes NOT DETECTED NOT DETECTED   Klebsiella oxytoca NOT DETECTED NOT DETECTED   Klebsiella pneumoniae NOT DETECTED NOT DETECTED   Proteus species NOT DETECTED NOT DETECTED   Salmonella species NOT DETECTED NOT DETECTED   Serratia marcescens NOT DETECTED NOT DETECTED   Haemophilus influenzae NOT DETECTED  NOT DETECTED   Neisseria meningitidis NOT DETECTED NOT DETECTED   Pseudomonas aeruginosa NOT DETECTED NOT DETECTED   Stenotrophomonas maltophilia NOT DETECTED NOT DETECTED   Candida albicans NOT DETECTED NOT DETECTED   Candida auris NOT DETECTED NOT DETECTED   Candida glabrata NOT DETECTED NOT DETECTED   Candida krusei NOT DETECTED NOT DETECTED   Candida parapsilosis NOT DETECTED NOT DETECTED   Candida tropicalis NOT DETECTED NOT DETECTED   Cryptococcus neoformans/gattii NOT DETECTED NOT DETECTED    Elliot Gurney, PharmD Clinical Pharmacist  07/18/2022 8:07 PM
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
## 2022-07-19 DIAGNOSIS — B953 Streptococcus pneumoniae as the cause of diseases classified elsewhere: Secondary | ICD-10-CM

## 2022-07-19 DIAGNOSIS — E43 Unspecified severe protein-calorie malnutrition: Secondary | ICD-10-CM | POA: Insufficient documentation

## 2022-07-19 DIAGNOSIS — J189 Pneumonia, unspecified organism: Secondary | ICD-10-CM | POA: Diagnosis not present

## 2022-07-19 LAB — BASIC METABOLIC PANEL
Anion gap: 8 (ref 5–15)
BUN: 26 mg/dL — ABNORMAL HIGH (ref 8–23)
CO2: 20 mmol/L — ABNORMAL LOW (ref 22–32)
Calcium: 8.3 mg/dL — ABNORMAL LOW (ref 8.9–10.3)
Chloride: 104 mmol/L (ref 98–111)
Creatinine, Ser: 0.61 mg/dL (ref 0.44–1.00)
GFR, Estimated: >60 mL/min (ref >60)
Glucose, Bld: 312 mg/dL — ABNORMAL HIGH (ref 70–99)
Potassium: 4.5 mmol/L (ref 3.5–5.1)
Sodium: 132 mmol/L — ABNORMAL LOW (ref 135–145)

## 2022-07-19 LAB — LEGIONELLA PNEUMOPHILA SEROGP 1 UR AG: L. pneumophila Serogp 1 Ur Ag: NEGATIVE

## 2022-07-19 LAB — URINE CULTURE: Culture: NO GROWTH

## 2022-07-19 LAB — EXPECTORATED SPUTUM ASSESSMENT W GRAM STAIN, RFLX TO RESP C

## 2022-07-19 LAB — GLUCOSE, CAPILLARY
Glucose-Capillary: 294 mg/dL — ABNORMAL HIGH (ref 70–99)
Glucose-Capillary: 391 mg/dL — ABNORMAL HIGH (ref 70–99)
Glucose-Capillary: 293 mg/dL — ABNORMAL HIGH (ref 70–99)
Glucose-Capillary: 240 mg/dL — ABNORMAL HIGH (ref 70–99)

## 2022-07-19 LAB — MAGNESIUM: Magnesium: 1.9 mg/dL (ref 1.7–2.4)

## 2022-07-19 LAB — PROCALCITONIN: Procalcitonin: 2.03 ng/mL

## 2022-07-19 MED ORDER — INSULIN ASPART 100 UNIT/ML IJ SOLN
0.0000 [IU] | Freq: Every day | INTRAMUSCULAR | Status: DC
  Administered 2022-07-19: 2 [IU] via SUBCUTANEOUS
  Filled 2022-07-19: qty 1

## 2022-07-19 MED ORDER — INSULIN ASPART 100 UNIT/ML IJ SOLN
0.0000 [IU] | Freq: Three times a day (TID) | INTRAMUSCULAR | Status: DC
  Administered 2022-07-19: 5 [IU] via SUBCUTANEOUS
  Administered 2022-07-22: 3 [IU] via SUBCUTANEOUS
  Filled 2022-07-19 (×2): qty 1

## 2022-07-19 MED ORDER — CHLORHEXIDINE GLUCONATE CLOTH 2 % EX PADS: 6.0000 | MEDICATED_PAD | Freq: Every day | CUTANEOUS | Status: DC

## 2022-07-19 MED ORDER — MUPIROCIN 2 % EX OINT
1.0000 | TOPICAL_OINTMENT | Freq: Two times a day (BID) | CUTANEOUS | Status: DC
  Administered 2022-07-19 – 2022-07-23 (×9): 1 via NASAL
  Filled 2022-07-19: qty 22

## 2022-07-19 NOTE — Progress Notes (Signed)
PROGRESS NOTE    Vanessa Oneal  QQI:[id number] DOB: 27-Jul-1951 DOA: 07/17/2022 PCP: Valerie Roys, DO    Brief Narrative:  71 y.o. female with medical history significant of GERD,RA,Fibromyalgia,  tobacco abuse, H/o drug abuse (cocaine abuse), anxiety , depression, COPD who presents to ed with one week of cough /sob and generalized weakness. Patient BIB EMS , of note in the field patient was noted to have saturation of 80% on RA and poc blood glucose of 248. Per patient she has fall 5 days ago and after that note pain on right side with coughing. She notes sob and coughing has progressed since then.   Chest CT with extensive right upper middle and lower lobe infiltrates.  Underlying neoplastic process cannot be excluded.  Marked severity emphysematous lung disease background.   Assessment & Plan:   Principal Problem:   CAP (community acquired pneumonia) Active Problems:   Anxiety disorder   Centrilobular emphysema (HCC)   Malnutrition ([place])   Benzodiazepine abuse ([place])   Compression fracture of third lumbar vertebra (HCC)   Protein-calorie malnutrition, severe   Bacteremia due to Streptococcus pneumoniae  Community-acquired pneumonia Acute hypoxic respiratory failure Strep pneumo bacteremia Severe sepsis secondary to above Sepsis criteria met with leukocytosis, fever, tachypnea, lactic acidosis Initially on 6 L, weaned to 4 Plan: Continue current antibiotic therapy with ceftriaxone, stop azithromycin, will plan on transition to orals once clearly clinically improving Follow repeat blood cultures and f/u sensitivities Sputum for culture Repeat blood cultures ordered Continue supplemental oxygen, wean as tolerated Pulmonary toileting, encourage I-S and flutter valve use Monitor vitals and fever curve Oxygen via face mask (doesn't tolerate nasal cannula) PT/OT advising HH PT/OT  Acute exacerbation of COPD Marked emphysematous changes noted on imaging Plan: P.o.  prednisone 40 mg daily x5 days Every 6 hours DuoNebs Twice daily Pulmicort Sepsis treatment as above  Hyponatremia Appears hypovolemic, treated with IVF - repeat sodium pending  Hyperglycemia A1c 5.1, nondiabetic No known history of diabetes 2/2 steroids - start SSI  Suspected severe malnutrition RD consult requested, supplements ordered  Rheumatoid arthritis Fibromyalgia Unclear if patient actually takes medications for this.  None listed on her home medication list  Tobacco use Counseled patient on cessation  History of drug use UDS positive for benzodiazepines and tricyclic's  Anxiety/depression No SSRIs or antidepressants listed UDS positive for benzodiazepines We will start as needed Xanax  GERD PPI  DVT prophylaxis: SQ Lovenox Code Status: Full.  Prior attending had a lengthy discussion with patient regarding her CODE STATUS.  Currently she confirms full code.  If clinical status deteriorates consider consultation with palliative care and further discussion regarding [place] [facility] Communication: None today.  Offered to call but patient declined.  Patient's contact is a granddaughter Designer, multimedia however no available phone number for her Disposition Plan: Status is: Inpatient Remains inpatient appropriate because: Severe COPD.  Community-acquired pneumonia on IV antibiotics.   Level of care: Progressive  Consultants:  None  Procedures:  None  Antimicrobials: Ceftriaxone Azithromycin   Subjective: Seen and examined.  Reports improvement in respiratory status since admission.  Is on 4 L nasal cannula at time of my evaluation.  Objective: Vitals:   07/19/22 0737 07/19/22 0841 07/19/22 1055 07/19/22 1209  BP:  105/64  114/69  Pulse:  100 90 99  Resp:  (!) 22  (!) 21  Temp:  97.6 F (36.4 C)    TempSrc:      SpO2: 91% 92% 100% 91%  Weight:  Height:        Intake/Output Summary (Last 24 hours) at 07/19/2022 1414 Last data filed at 07/19/2022  0650 Gross per 24 hour  Intake 2755.72 ml  Output 550 ml  Net 2205.72 ml   Filed Weights   07/17/22 2201 07/19/22 0105  Weight: 43.1 kg 39.5 kg    Examination:  General exam: No acute distress.  Appears frail and chronically ill Respiratory system: Diminished breath sounds bilaterally.  Scattered end expiratory wheeze.  right-sided crackles.  Normal work of breathing.  4 L face mask. Speaking in complete sentences. Cardiovascular system: S1-S2, RRR, no murmurs, no pedal edema Gastrointestinal system: Thin/scaphoid, soft, NT/ND, normal bowel sounds 7 Central nervous system: Alert and oriented. No focal neurological deficits. Extremities: Good decreased power bilaterally.  Muscle wasting noted bilaterally Skin: Thin and pale with no obvious rashes or lesions Psychiatry: Judgement and insight appear normal. Mood & affect appropriate.     Data Reviewed: I have personally reviewed following labs and imaging studies  CBC: Recent Labs  Lab 07/17/22 2206 07/18/22 0112 07/18/22 0429  WBC 18.5* 21.3* 18.5*  NEUTROABS 16.7*  --   --   HGB 10.3* 10.5* 10.2*  HCT 29.5* 30.3* 29.5*  MCV 95.8 96.5 96.7  PLT 180 192 979   Basic Metabolic Panel: Recent Labs  Lab 07/17/22 2206 07/18/22 0112 07/18/22 0429  NA 126*  --  131*  K 3.0*  --  2.9*  CL 91*  --  97*  CO2 26  --  25  GLUCOSE 168*  --  145*  BUN 25*  --  18  CREATININE 0.71 0.61 0.58  CALCIUM 7.6*  --  7.7*   GFR: Estimated Creatinine Clearance: 40.8 mL/min (by C-G formula based on SCr of 0.58 mg/dL). Liver Function Tests: Recent Labs  Lab 07/17/22 2206 07/18/22 0429  AST 26 21  ALT 14 14  ALKPHOS 81 77  BILITOT 0.6 1.0  PROT 5.3* 5.1*  ALBUMIN 2.0* 1.8*   No results for input(s): "LIPASE", "AMYLASE" in the last 168 hours. No results for input(s): "AMMONIA" in the last 168 hours. Coagulation Profile: Recent Labs  Lab 07/17/22 2206  INR 1.2   Cardiac Enzymes: No results for input(s): "CKTOTAL", "CKMB",  "CKMBINDEX", "TROPONINI" in the last 168 hours. BNP (last 3 results) No results for input(s): "PROBNP" in the last 8760 hours. HbA1C: Recent Labs    07/18/22 0112  HGBA1C 5.1   CBG: Recent Labs  Lab 07/19/22 0839 07/19/22 1207  GLUCAP 294* 391*   Lipid Profile: No results for input(s): "CHOL", "HDL", "LDLCALC", "TRIG", "CHOLHDL", "LDLDIRECT" in the last 72 hours. Thyroid Function Tests: Recent Labs    07/18/22 0429  TSH 1.157   Anemia Panel: No results for input(s): "VITAMINB12", "FOLATE", "FERRITIN", "TIBC", "IRON", "RETICCTPCT" in the last 72 hours. Sepsis Labs: Recent Labs  Lab 07/17/22 2206 07/18/22 0112 07/18/22 0429 07/19/22 0548  PROCALCITON 3.69  --  3.24 2.03  LATICACIDVEN 2.2* 1.3 1.2  --     Recent Results (from the past 240 hour(s))  Blood Culture (routine x 2)     Status: None (Preliminary result)   Collection Time: 07/17/22 10:06 PM   Specimen: BLOOD  Result Value Ref Range Status   Specimen Description   Final    BLOOD LEFT HAND Performed at [facility], [address], [place], [place] [address]    Special Requests   Final    BOTTLES DRAWN AEROBIC AND ANAEROBIC Blood Culture results may not be  optimal due to an inadequate volume of blood received in culture bottles Performed at [facility], [address] [place], [place] [address]    Culture  Setup Time   Final    GRAM POSITIVE COCCI IN BOTH AEROBIC AND ANAEROBIC BOTTLES CRITICAL VALUE NOTED.  VALUE IS CONSISTENT WITH PREVIOUSLY REPORTED AND CALLED VALUE.    Culture   Final    GRAM POSITIVE COCCI IDENTIFICATION TO FOLLOW Performed at [facility], [place] [address], [place], [place] [address]    Report Status PENDING  Incomplete  Blood Culture (routine x 2)     Status: Abnormal (Preliminary result)   Collection Time: 07/17/22 10:06 PM   Specimen: BLOOD  Result Value Ref Range Status   Specimen Description   Final    BLOOD RIGHT FOREARM Performed at [facility], [address], [place], [place] [address]    Special Requests   Final    BOTTLES DRAWN AEROBIC AND ANAEROBIC Blood Culture results may not be optimal due to an inadequate volume of blood received in culture bottles Performed at [facility], [address] [place], [place] [address]    Culture  Setup Time   Final    AEROBIC BOTTLE ONLY GRAM POSITIVE COCCI CRITICAL RESULT CALLED TO, READ BACK BY AND VERIFIED WITH: CAROLINE CHILDEF AT 5361 ON 07/18/22 BY SS    Culture (A)  Final    STREPTOCOCCUS PNEUMONIAE SUSCEPTIBILITIES TO FOLLOW Performed at [facility], [place] [address], [place], [place] [address]    Report Status PENDING  Incomplete  Blood Culture ID Panel (Reflexed)     Status: Abnormal   Collection Time: 07/17/22 10:06 PM  Result Value Ref Range Status   Enterococcus faecalis NOT DETECTED NOT DETECTED Final   Enterococcus Faecium NOT DETECTED NOT DETECTED Final   Listeria monocytogenes NOT DETECTED NOT DETECTED Final   Staphylococcus species NOT DETECTED NOT DETECTED Final   Staphylococcus aureus (BCID) NOT DETECTED NOT DETECTED Final   Staphylococcus epidermidis NOT DETECTED NOT DETECTED Final   Staphylococcus lugdunensis NOT DETECTED NOT DETECTED Final   Streptococcus species DETECTED (A) NOT DETECTED Final    Comment: CRITICAL RESULT CALLED TO, READ BACK BY AND VERIFIED WITH: CAROLINE CHILDEF AT 1649 ON 07/18/22 BY SS    Streptococcus agalactiae NOT DETECTED NOT DETECTED Final   Streptococcus pneumoniae DETECTED (A) NOT DETECTED Final    Comment: CRITICAL RESULT CALLED TO, READ BACK BY AND VERIFIED WITH: CAROLINE CHILDEF AT 1649 ON 07/18/22 BY SS    Streptococcus pyogenes NOT DETECTED NOT DETECTED Final   A.calcoaceticus-baumannii NOT DETECTED NOT DETECTED Final   Bacteroides fragilis NOT DETECTED NOT DETECTED Final   Enterobacterales NOT DETECTED NOT DETECTED Final   Enterobacter cloacae complex NOT DETECTED NOT DETECTED Final    Escherichia coli NOT DETECTED NOT DETECTED Final   Klebsiella aerogenes NOT DETECTED NOT DETECTED Final   Klebsiella oxytoca NOT DETECTED NOT DETECTED Final   Klebsiella pneumoniae NOT DETECTED NOT DETECTED Final   Proteus species NOT DETECTED NOT DETECTED Final   Salmonella species NOT DETECTED NOT DETECTED Final   Serratia marcescens NOT DETECTED NOT DETECTED Final   Haemophilus influenzae NOT DETECTED NOT DETECTED Final   Neisseria meningitidis NOT DETECTED NOT DETECTED Final   Pseudomonas aeruginosa NOT DETECTED NOT DETECTED Final   Stenotrophomonas maltophilia NOT DETECTED NOT DETECTED Final   Candida albicans NOT DETECTED NOT DETECTED Final   Candida auris NOT DETECTED NOT DETECTED Final   Candida glabrata  NOT DETECTED NOT DETECTED Final   Candida krusei NOT DETECTED NOT DETECTED Final   Candida parapsilosis NOT DETECTED NOT DETECTED Final   Candida tropicalis NOT DETECTED NOT DETECTED Final   Cryptococcus neoformans/gattii NOT DETECTED NOT DETECTED Final    Comment: Performed at [facility], [address] [place], [place] [address]  Resp Panel by RT-PCR (Flu A&B, Covid) Anterior Nasal Swab     Status: None   Collection Time: 07/17/22 11:05 PM   Specimen: Anterior Nasal Swab  Result Value Ref Range Status   SARS Coronavirus 2 by RT PCR NEGATIVE NEGATIVE Final    Comment: (NOTE) SARS-CoV-2 target nucleic acids are NOT DETECTED.  The SARS-CoV-2 RNA is generally detectable in upper respiratory specimens during the acute phase of infection. The lowest concentration of SARS-CoV-2 viral copies this assay can detect is 138 copies/mL. A negative result does not preclude SARS-Cov-2 infection and should not be used as the sole basis for treatment or other patient management decisions. A negative result may occur with  improper specimen collection/handling, submission of specimen other than nasopharyngeal swab, presence of viral mutation(s) within the areas targeted by this  assay, and inadequate number of viral copies(<138 copies/mL). A negative result must be combined with clinical observations, patient history, and epidemiological information. The expected result is Negative.  Fact Sheet for Patients:  EntrepreneurPulse.com.au  Fact Sheet for Healthcare Providers:  IncredibleEmployment.be  This test is no t yet approved or cleared by the [place] FDA and  has been authorized for detection and/or diagnosis of SARS-CoV-2 by FDA under an Emergency Use Authorization (EUA). This EUA will remain  in effect (meaning this test can be used) for the duration of the COVID-19 declaration under Section 564(b)(1) of the Act, 21 U.S.C.section 360bbb-3(b)(1), unless the authorization is terminated  or revoked sooner.       Influenza A by PCR NEGATIVE NEGATIVE Final   Influenza B by PCR NEGATIVE NEGATIVE Final    Comment: (NOTE) The Xpert Xpress SARS-CoV-2/FLU/RSV plus assay is intended as an aid in the diagnosis of influenza from Nasopharyngeal swab specimens and should not be used as a sole basis for treatment. Nasal washings and aspirates are unacceptable for Xpert Xpress SARS-CoV-2/FLU/RSV testing.  Fact Sheet for Patients: EntrepreneurPulse.com.au  Fact Sheet for Healthcare Providers: IncredibleEmployment.be  This test is not yet approved or cleared by the [place] FDA and has been authorized for detection and/or diagnosis of SARS-CoV-2 by FDA under an Emergency Use Authorization (EUA). This EUA will remain in effect (meaning this test can be used) for the duration of the COVID-19 declaration under Section 564(b)(1) of the Act, 21 U.S.C. section 360bbb-3(b)(1), unless the authorization is terminated or revoked.  Performed at [facility], [address] [place], [place] [address]   MRSA Next Gen by PCR, Nasal     Status: Abnormal   Collection Time: 07/18/22   1:12 AM   Specimen: Nasal Mucosa; Nasal Swab  Result Value Ref Range Status   MRSA by PCR Next Gen DETECTED (A) NOT DETECTED Final    Comment: RESULT CALLED TO, READ BACK BY AND VERIFIED WITH: VENESSA ASHLEY 07/18/22 0620 MW (NOTE) The GeneXpert MRSA Assay (FDA approved for NASAL specimens only), is one component of a comprehensive MRSA colonization surveillance program. It is not intended to diagnose MRSA infection nor to guide or monitor treatment for MRSA infections. Test performance is not FDA approved in patients less than 54 years old. Performed at [facility], [address] [place], [place] [address]   Respiratory (~20 pathogens) panel by PCR     Status: None   Collection Time: 07/18/22  4:29 AM   Specimen: Nasopharyngeal Swab; Respiratory  Result Value Ref Range Status   Adenovirus NOT DETECTED NOT DETECTED Final   Coronavirus 229E NOT DETECTED NOT DETECTED Final    Comment: (NOTE) The Coronavirus on the Respiratory Panel, DOES NOT test for the novel  Coronavirus (2019 nCoV)    Coronavirus HKU1 NOT DETECTED NOT DETECTED Final   Coronavirus NL63 NOT DETECTED NOT DETECTED Final   Coronavirus OC43 NOT DETECTED NOT DETECTED Final   Metapneumovirus NOT DETECTED NOT DETECTED Final   Rhinovirus / Enterovirus NOT DETECTED NOT DETECTED Final   Influenza A NOT DETECTED NOT DETECTED Final   Influenza B NOT DETECTED NOT DETECTED Final   Parainfluenza Virus 1 NOT DETECTED NOT DETECTED Final   Parainfluenza Virus 2 NOT DETECTED NOT DETECTED Final   Parainfluenza Virus 3 NOT DETECTED NOT DETECTED Final   Parainfluenza Virus 4 NOT DETECTED NOT DETECTED Final   Respiratory Syncytial Virus NOT DETECTED NOT DETECTED Final   Bordetella pertussis NOT DETECTED NOT DETECTED Final   Bordetella Parapertussis NOT DETECTED NOT DETECTED Final   Chlamydophila pneumoniae NOT DETECTED NOT DETECTED Final   Mycoplasma pneumoniae NOT DETECTED NOT DETECTED Final    Comment: Performed at  [facility], [place]. [address] [place], [place] [address]  Urine Culture     Status: None   Collection Time: 07/18/22  7:59 AM   Specimen: Urine, Clean Catch  Result Value Ref Range Status   Specimen Description   Final    URINE, CLEAN CATCH Performed at [facility], [address] [place], [place] [address]    Special Requests   Final    NONE Performed at [facility], [address] [place], [place] [address]    Culture   Final    NO GROWTH Performed at [facility], [place] [address] [place],  [address]    Report Status 07/19/2022 FINAL  Final         Radiology Studies: CT Angio Chest Pulmonary Embolism (PE) W or WO Contrast  Result Date: 07/18/2022 CLINICAL DATA:  Cough and shortness of breath. EXAM: CT ANGIOGRAPHY CHEST WITH CONTRAST TECHNIQUE: Multidetector CT imaging of the chest was performed using the standard protocol during bolus administration of intravenous contrast. Multiplanar CT image reconstructions and MIPs were obtained to evaluate the vascular anatomy. RADIATION DOSE REDUCTION: This exam was performed according to the departmental dose-optimization program which includes automated exposure control, adjustment of the mA and/or kV according to patient size and/or use of iterative reconstruction technique. CONTRAST:  108m OMNIPAQUE IOHEXOL 350 MG/ML SOLN COMPARISON:  July 15, 2020 FINDINGS: Cardiovascular: There is mild calcification of the aortic arch, without evidence of aortic aneurysm. Satisfactory opacification of the pulmonary arteries to the segmental level. No evidence of pulmonary embolism. Normal heart size. No pericardial effusion. Mediastinum/Nodes: A 2.2 cm x 1.4 cm precarinal lymph node is seen. Mild AP window, pretracheal, subcarinal and right hilar lymphadenopathy is also noted. Thyroid gland, trachea, and esophagus demonstrate no significant findings. Lungs/Pleura: There is marked severity emphysematous lung  disease. Extensive infiltrates and large areas of subsequent consolidation are seen within the right upper lobe, right middle lobe and right lower lobe. There is no evidence of a pleural effusion or pneumothorax. Upper Abdomen: No acute abnormality. Musculoskeletal: No chest wall abnormality. No acute or significant osseous findings.  Review of the MIP images confirms the above findings. IMPRESSION: 1. No evidence of pulmonary embolism. 2. Extensive right upper lobe, right middle lobe and right lower lobe infiltrates. Follow-up to resolution is recommended, as an underlying neoplastic process cannot be excluded. 3. Marked severity emphysematous lung disease. Aortic Atherosclerosis (ICD10-I70.0) and Emphysema (ICD10-J43.9). Electronically Signed   By: Virgina Norfolk M.D.   On: 07/18/2022 02:01   DG Chest Port 1 View  Result Date: 07/17/2022 CLINICAL DATA:  Possible sepsis with hypoxia and recent fall EXAM: PORTABLE CHEST 1 VIEW COMPARISON:  07/15/2020 FINDINGS: Cardiac shadow is within normal limits. Lungs are hyperinflated consistent with COPD. Aortic calcifications are noted. Diffuse airspace opacity is noted throughout the right lung consistent with acute pneumonia. No new focal abnormality is noted. IMPRESSION: Diffuse right-sided infiltrate consistent with pneumonia. Electronically Signed   By: Inez Catalina M.D.   On: 07/17/2022 22:47        Scheduled Meds:  arformoterol  15 mcg Nebulization BID   budesonide (PULMICORT) nebulizer solution  0.25 mg Nebulization BID   enoxaparin (LOVENOX) injection  30 mg Subcutaneous Q24H   feeding supplement  237 mL Oral TID BM   guaiFENesin  600 mg Oral BID   insulin aspart  0-5 Units Subcutaneous QHS   insulin aspart  0-9 Units Subcutaneous TID WC   ipratropium-albuterol  3 mL Inhalation Q6H   multivitamin with minerals  1 tablet Oral Daily   pantoprazole  40 mg Oral Daily   predniSONE  40 mg Oral Q breakfast   Continuous Infusions:  0.9 % NaCl with  KCl 20 mEq / L 100 mL/hr at 07/19/22 0650   cefTRIAXone (ROCEPHIN)  IV 2 g (07/19/22 0908)     LOS: 1 day    Desma Maxim, MD [facility]   If 7PM-7AM, please contact night-coverage  07/19/2022, 2:14 PM

## 2022-07-19 NOTE — Inpatient Diabetes Management (Signed)
Inpatient Diabetes Program Recommendations  AACE/ADA: New Consensus Statement on Inpatient Glycemic Control (2015)  Target Ranges:  Prepandial:   less than 140 mg/dL      Peak postprandial:   less than 180 mg/dL (1-2 hours)      Critically ill patients:  140 - 180 mg/dL   Lab Results  Component Value Date   GLUCAP 391 (H) 07/19/2022   HGBA1C 5.1 07/18/2022    Diabetes history: No hx DM  Latest Reference Range & Units 07/19/22 08:39 07/19/22 12:07  Glucose-Capillary 70 - 99 mg/dL 144 (H) 315 (H)  (H): Data is abnormally high  Inpatient Diabetes Program Recommendations:    Please consider while on steroids: -Glycemic control order set with 0-6 units tid, 0-5 units hs  Thank you, Darel Hong E. Ajai Harville, RN, MSN, CDE  Diabetes Coordinator Inpatient Glycemic Control Team Team Pager [phone] (8am-5pm) 07/19/2022 12:46 PM

## 2022-07-19 NOTE — TOC Initial Note (Addendum)
Transition of Care [facility]) - Initial/Assessment Note    Patient Details  Name: Vanessa Oneal MRN: [id number] Date of Birth: 05-10-1951  Transition of Care [facility]) CM/SW Contact:    Candie Chroman, LCSW Phone Number: 07/19/2022, 10:39 AM  Clinical Narrative:   CSW met with patient. No supports at bedside. CSW introduced role and explained that therapy recommendations would be discussed. She is agreeable to home health, if needed. Gave CMS scores for agencies that serve her zip code. Will start search. She is agreeable to DME recommendation for 3-in-1. Patient is not on oxygen at home, currently on 3 L. She reports she does not have a ride home and has no family in the area. She thinks she can probably pay for a cab home. No further concerns. CSW encouraged patient to contact CSW as needed. CSW will continue to follow patient for support and facilitate return home once stable.               1:07 pm: [facility] can accept referral for PT, OT, RN.  Expected Discharge Plan: [place] Barriers to Discharge: Continued Medical Work up   Patient Goals and CMS Choice   CMS Medicare.gov Compare Post Acute Care list provided to:: Patient    Expected Discharge Plan and Services Expected Discharge Plan: [place] Choice: Durable Medical Equipment, Home Health Living arrangements for the past 2 months: Mobile Home                                      Prior Living Arrangements/Services Living arrangements for the past 2 months: Mobile Home Lives with:: Relatives Patient language and need for interpreter reviewed:: Yes Do you feel safe going back to the place where you live?: Yes      Need for Family Participation in Patient Care: Yes (Comment) Care giver support system in place?: Yes (comment)   Criminal Activity/Legal Involvement Pertinent to Current Situation/Hospitalization: No - Comment as needed  Activities of Daily  Living Home Assistive Devices/Equipment: Walker (specify type), Other (Comment) (walker with tennis balls) ADL Screening (condition at time of admission) Patient's cognitive ability adequate to safely complete daily activities?: No Is the patient deaf or have difficulty hearing?: No Does the patient have difficulty seeing, even when wearing glasses/contacts?: No Does the patient have difficulty concentrating, remembering, or making decisions?: No Patient able to express need for assistance with ADLs?: Yes Does the patient have difficulty dressing or bathing?: No Independently performs ADLs?: Yes (appropriate for developmental age) Does the patient have difficulty walking or climbing stairs?: No Weakness of Legs: None Weakness of Arms/Hands: None  Permission Sought/Granted Permission sought to share information with : Facility Art therapist granted to share information with : Yes, Verbal Permission Granted     Permission granted to share info w AGENCY: Home Health Agencies        Emotional Assessment Appearance:: Appears stated age Attitude/Demeanor/Rapport: Engaged, Gracious Affect (typically observed): Accepting, Appropriate, Calm, Pleasant Orientation: : Oriented to Self, Oriented to Place, Oriented to  Time, Oriented to Situation Alcohol / Substance Use: Not Applicable Psych Involvement: No (comment)  Admission diagnosis:  CAP (community acquired pneumonia) [J18.9] Acute respiratory failure with hypoxia ([place]) [J96.01] Pneumonia of right lung due to infectious organism, unspecified part of lung [J18.9] Sepsis, due to unspecified organism, unspecified whether acute organ dysfunction present (  [place]) [A41.9] Patient Active Problem List   Diagnosis Date Noted   CAP (community acquired pneumonia) 07/18/2022   Compression fracture of third lumbar vertebra ([place]) 11/15/2021   Benzodiazepine abuse ([place]) 06/06/2021   Alcohol abuse 06/06/2021   Aortic atherosclerosis  ([place]) 01/07/2021   CAD (coronary artery disease) 02/28/2018   Centrilobular emphysema ([place]) 02/28/2018   Chronic hepatitis C without hepatic coma ([place]) 11/22/2017   Opiate abuse, continuous ([place]) 11/19/2017   Anxiety disorder 11/19/2017   Osteoarthritis 11/19/2017   Vitamin D deficiency, unspecified 05/30/2016   Malnutrition ([place]) 05/22/2016   PCP:  Valerie Roys, DO Pharmacy:   [facility] [address] [place] [address] Phone: [phone] Fax: [phone]  [id number] - [place], [address] [place] [address] Phone: [phone] Fax: [phone]     Social Determinants of Health (SDOH) Interventions    Readmission Risk Interventions     No data to display

## 2022-07-20 DIAGNOSIS — J189 Pneumonia, unspecified organism: Secondary | ICD-10-CM | POA: Diagnosis not present

## 2022-07-20 LAB — GLUCOSE, CAPILLARY
Glucose-Capillary: 101 mg/dL — ABNORMAL HIGH (ref 70–99)
Glucose-Capillary: 98 mg/dL (ref 70–99)
Glucose-Capillary: 135 mg/dL — ABNORMAL HIGH (ref 70–99)
Glucose-Capillary: 122 mg/dL — ABNORMAL HIGH (ref 70–99)

## 2022-07-20 LAB — CULTURE, BLOOD (ROUTINE X 2)

## 2022-07-20 LAB — BASIC METABOLIC PANEL
Anion gap: 5 (ref 5–15)
BUN: 29 mg/dL — ABNORMAL HIGH (ref 8–23)
CO2: 24 mmol/L (ref 22–32)
Calcium: 8.1 mg/dL — ABNORMAL LOW (ref 8.9–10.3)
Chloride: 105 mmol/L (ref 98–111)
Creatinine, Ser: 0.53 mg/dL (ref 0.44–1.00)
GFR, Estimated: >60 mL/min (ref >60)
Glucose, Bld: 127 mg/dL — ABNORMAL HIGH (ref 70–99)
Potassium: 4.4 mmol/L (ref 3.5–5.1)
Sodium: 134 mmol/L — ABNORMAL LOW (ref 135–145)

## 2022-07-20 MED ORDER — MAGIC MOUTHWASH
15.0000 mL | Freq: Four times a day (QID) | ORAL | Status: DC
  Administered 2022-07-20: 15 mL via ORAL
  Filled 2022-07-20 (×3): qty 20

## 2022-07-20 MED ORDER — IPRATROPIUM-ALBUTEROL 0.5-2.5 (3) MG/3ML IN SOLN
3.0000 mL | Freq: Three times a day (TID) | RESPIRATORY_TRACT | Status: DC
  Administered 2022-07-20: 3 mL via RESPIRATORY_TRACT
  Filled 2022-07-20 (×2): qty 3

## 2022-07-20 MED ORDER — ALPRAZOLAM 0.25 MG PO TABS
0.2500 mg | ORAL_TABLET | Freq: Once | ORAL | Status: AC
  Administered 2022-07-20: 0.25 mg via ORAL

## 2022-07-20 NOTE — Progress Notes (Signed)
PT Cancellation Note  Patient Details Name: Vanessa Oneal MRN: [id number] DOB: 23-Apr-1951   Cancelled Treatment:     Therapist in to see pt who adamantly refused any activity. Will re-attempt next available date/time per POC.   [name] 07/20/2022, 1:54 PM

## 2022-07-20 NOTE — Progress Notes (Addendum)
Initial Nutrition Assessment  DOCUMENTATION CODES:   Underweight, Severe malnutrition in context of chronic illness  INTERVENTION:   -Continue Ensure Enlive po TID, each supplement provides 350 kcal and 20 grams of protein -Hormel Shake BID, each supplement provides 520 kcals and 22 grams protein -Continue MVI with minerals daily  NUTRITION DIAGNOSIS:   Severe Malnutrition related to chronic illness (COPD) as evidenced by severe fat depletion, severe muscle depletion.  Ongoing  GOAL:   Patient will meet greater than or equal to 90% of their needs  Progressing   MONITOR:   PO intake, Supplement acceptance  REASON FOR ASSESSMENT:   Consult Assessment of nutrition requirement/status  ASSESSMENT:   Pt with medical history significant of GERD,RA,Fibromyalgia,  tobacco abuse, H/o drug abuse (cocaine abuse), anxiety , depression, COPD who presents with one week of cough /sob and generalized weakness.  8/15- s/p BSE- dysphagia 3 diet with thin liquids  Reviewed I/O's: -80 ml x 24 hours and +2.1 L since admission  UOP: 800 ml x 24 hours   Pt agitated at time of visit. Kicking her legs in the air.   Pt remains with poor oral intake. Noted meal completions 0-25%. Pt has been drinking Ensure, but refused this morning's dose.   Per MD notes, pt is a full code. Per TOC notes, plan to discharge home with home health services once medically stable.   Medication reviewed and include prednisone.   Labs reviewed: Na: 134, CBGS: 101-293 (inpatient orders for glycemic control are 0-5 units insulin aspart daily at bedtime and 0-9 units insulin aspart TID with meals).    Diet Order:   Diet Order             DIET DYS 3 Room service appropriate? Yes with Assist; Fluid consistency: Thin  Diet effective now                   EDUCATION NEEDS:   Education needs have been addressed  Skin:  Skin Assessment: Reviewed RN Assessment  Last BM:  07/18/22  Height:   Ht Readings  from Last 1 Encounters:  07/17/22 5\' 1"  (1.549 m)    Weight:   Wt Readings from Last 1 Encounters:  07/20/22 40.5 kg    Ideal Body Weight:  47.7 kg  BMI:  Body mass index is 16.87 kg/m.  Estimated Nutritional Needs:   Kcal:  1700-1900  Protein:  90-105 grams  Fluid:  > 1.7 L    07/22/22, RD, LDN, CDCES Registered Dietitian II Certified Diabetes Care and Education Specialist Please refer to [facility] for RD and/or RD on-call/weekend/after hours pager

## 2022-07-20 NOTE — Progress Notes (Signed)
PROGRESS NOTE    Vanessa Oneal  XIP:[id number] DOB: [birth year] DOA: 07/17/2022 PCP: Valerie Roys, DO    Brief Narrative:  71 y.o. female with medical history significant of GERD,RA,Fibromyalgia,  tobacco abuse, H/o drug abuse (cocaine abuse), anxiety , depression, COPD who presents to ed with one week of cough /sob and generalized weakness. Patient BIB EMS , of note in the field patient was noted to have saturation of 80% on RA and poc blood glucose of 248. Per patient she has fall 5 days ago and after that note pain on right side with coughing. She notes sob and coughing has progressed since then.   Chest CT with extensive right upper middle and lower lobe infiltrates.  Underlying neoplastic process cannot be excluded.  Marked severity emphysematous lung disease background.   Assessment & Plan:   Principal Problem:   CAP (community acquired pneumonia) Active Problems:   Anxiety disorder   Centrilobular emphysema (HCC)   Malnutrition ([place])   Benzodiazepine abuse ([place])   Compression fracture of third lumbar vertebra (HCC)   Protein-calorie malnutrition, severe   Bacteremia due to Streptococcus pneumoniae  Community-acquired pneumonia Acute hypoxic respiratory failure Strep pneumo bacteremia Severe sepsis secondary to above Sepsis criteria met with leukocytosis, fever, tachypnea, lactic acidosis Initially on 6 L, weaned to 2 today but remains very dyspneic Plan: Continue current antibiotic therapy with ceftriaxone Sensitivities have resulted, will discuss w/ pharm tomorrow transition to orals Sputum for culture, results pending Repeat blood cultures ordered, ngtd Continue supplemental oxygen, wean as tolerated Pulmonary toileting, encourage I-S and flutter valve use Monitor vitals and fever curve PT/OT advising HH PT/OT  Acute exacerbation of COPD Marked emphysematous changes noted on imaging Plan: P.o. prednisone 40 mg daily x5 days Every 6 hours DuoNebs Twice  daily Pulmicort Sepsis treatment as above - controller triple therapy at d/c  Hyponatremia Appears hypovolemic, treated with IVF, improving with fluids  Hyperglycemia A1c 5.1, nondiabetic No known history of diabetes 2/2 steroids - cont  SSI  Suspected severe malnutrition RD consult requested, supplements ordered  Rheumatoid arthritis Fibromyalgia Unclear if patient actually takes medications for this.  None listed on her home medication list  Tobacco use Counseled patient on cessation  History of drug use UDS positive for benzodiazepines and tricyclic's  Anxiety/depression No SSRIs or antidepressants listed UDS positive for benzodiazepines We will start as needed Xanax  GERD PPI  DVT prophylaxis: SQ Lovenox Code Status: Full.  Prior attending had a lengthy discussion with patient regarding her CODE STATUS.  Currently she confirms full code.  If clinical status deteriorates consider consultation with palliative care and further discussion regarding [place] Family Communication: son updated telephonically 8/17 Remains inpatient appropriate because: ongoing respiratory distress   Level of care: Progressive  Consultants:  None  Procedures:  None  Antimicrobials: Ceftriaxone    Subjective: Seen and examined.  Feeling fatigued breathing somewhat improved  Objective: Vitals:   07/20/22 0500 07/20/22 0757 07/20/22 1141 07/20/22 1600  BP:  119/78 118/79 114/74  Pulse:  79 97 97  Resp:  _0 Temp:  97.9 F (36.6 C) 97.7 F (36.5 C) 97.9 F (36.6 C)  TempSrc:      SpO2:  100% 93% 97%  Weight: 40.5 kg     Height:        Intake/Output Summary (Last 24 hours) at 07/20/2022 1658 Last data filed at 07/20/2022 1426 Gross per 24 hour  Intake --  Output 1100 ml  Net -1100 ml  Filed Weights   07/17/22 2201 07/19/22 0105 07/20/22 0500  Weight: 43.1 kg 39.5 kg 40.5 kg    Examination:  General exam: No acute distress.  Appears frail and chronically  ill Respiratory system: Diminished breath sounds bilaterally.  Scattered end expiratory wheeze.  right-sided crackles.  Normal work of breathing.  4 L face mask. Speaking in complete sentences. Cardiovascular system: S1-S2, RRR, no murmurs, no pedal edema Gastrointestinal system: Thin/scaphoid, soft, NT/ND, normal bowel sounds 7 Central nervous system: Alert and oriented. No focal neurological deficits. Extremities: Good decreased power bilaterally.  Muscle wasting noted bilaterally Skin: Thin and pale with no obvious rashes or lesions Psychiatry: Judgement and insight appear normal. Mood & affect appropriate.     Data Reviewed: I have personally reviewed following labs and imaging studies  CBC: Recent Labs  Lab 07/17/22 2206 07/18/22 0112 07/18/22 0429  WBC 18.5* 21.3* 18.5*  NEUTROABS 16.7*  --   --   HGB 10.3* 10.5* 10.2*  HCT 29.5* 30.3* 29.5*  MCV 95.8 96.5 96.7  PLT 180 192 517   Basic Metabolic Panel: Recent Labs  Lab 07/17/22 2206 07/18/22 0112 07/18/22 0429 07/19/22 1422 07/20/22 0449  NA 126*  --  131* 132* 134*  K 3.0*  --  2.9* 4.5 4.4  CL 91*  --  97* 104 105  CO2 26  --  25 20* 24  GLUCOSE 168*  --  145* 312* 127*  BUN 25*  --  18 26* 29*  CREATININE 0.71 0.61 0.58 0.61 0.53  CALCIUM 7.6*  --  7.7* 8.3* 8.1*  MG  --   --   --  1.9  --    GFR: Estimated Creatinine Clearance: 41.8 mL/min (by C-G formula based on SCr of 0.53 mg/dL). Liver Function Tests: Recent Labs  Lab 07/17/22 2206 07/18/22 0429  AST 26 21  ALT 14 14  ALKPHOS 81 77  BILITOT 0.6 1.0  PROT 5.3* 5.1*  ALBUMIN 2.0* 1.8*   No results for input(s): "LIPASE", "AMYLASE" in the last 168 hours. No results for input(s): "AMMONIA" in the last 168 hours. Coagulation Profile: Recent Labs  Lab 07/17/22 2206  INR 1.2   Cardiac Enzymes: No results for input(s): "CKTOTAL", "CKMB", "CKMBINDEX", "TROPONINI" in the last 168 hours. BNP (last 3 results) No results for input(s): "PROBNP" in  the last 8760 hours. HbA1C: Recent Labs    07/18/22 0112  HGBA1C 5.1   CBG: Recent Labs  Lab 07/19/22 1515 07/19/22 2113 07/20/22 0759 07/20/22 1145 07/20/22 1600  GLUCAP 293* 240* 101* 98 135*   Lipid Profile: No results for input(s): "CHOL", "HDL", "LDLCALC", "TRIG", "CHOLHDL", "LDLDIRECT" in the last 72 hours. Thyroid Function Tests: Recent Labs    07/18/22 0429  TSH 1.157   Anemia Panel: No results for input(s): "VITAMINB12", "FOLATE", "FERRITIN", "TIBC", "IRON", "RETICCTPCT" in the last 72 hours. Sepsis Labs: Recent Labs  Lab 07/17/22 2206 07/18/22 0112 07/18/22 0429 07/19/22 0548  PROCALCITON 3.69  --  3.24 2.03  LATICACIDVEN 2.2* 1.3 1.2  --     Recent Results (from the past 240 hour(s))  Blood Culture (routine x 2)     Status: Abnormal   Collection Time: 07/17/22 10:06 PM   Specimen: BLOOD  Result Value Ref Range Status   Specimen Description   Final    BLOOD LEFT HAND Performed at [facility], [address] [place], [place] [address]    Special Requests   Final    BOTTLES DRAWN AEROBIC AND ANAEROBIC  Blood Culture results may not be optimal due to an inadequate volume of blood received in culture bottles Performed at [facility], [address] [place], [place] [address]    Culture  Setup Time   Final    GRAM POSITIVE COCCI IN BOTH AEROBIC AND ANAEROBIC BOTTLES CRITICAL VALUE NOTED.  VALUE IS CONSISTENT WITH PREVIOUSLY REPORTED AND CALLED VALUE.    Culture (A)  Final    STREPTOCOCCUS PNEUMONIAE SUSCEPTIBILITIES PERFORMED ON PREVIOUS CULTURE WITHIN THE LAST 5 DAYS. Performed at [facility], [place] [address], [place], [place] [address]    Report Status 07/20/2022 FINAL  Final  Blood Culture (routine x 2)     Status: Abnormal   Collection Time: 07/17/22 10:06 PM   Specimen: BLOOD  Result Value Ref Range Status   Specimen Description   Final    BLOOD RIGHT FOREARM Performed at [facility], [address] [place], [place] [address]    Special Requests   Final    BOTTLES DRAWN AEROBIC AND ANAEROBIC Blood Culture results may not be optimal due to an inadequate volume of blood received in culture bottles Performed at [facility] [facility], [address] [place], [place] [address]    Culture  Setup Time   Final    AEROBIC BOTTLE ONLY GRAM POSITIVE COCCI CRITICAL RESULT CALLED TO, READ BACK BY AND VERIFIED WITH: CAROLINE CHILDEF AT 9381 ON 07/18/22 BY SS    Culture STREPTOCOCCUS PNEUMONIAE (A)  Final   Report Status 07/20/2022 FINAL  Final   Organism ID, Bacteria STREPTOCOCCUS PNEUMONIAE  Final      Susceptibility   Streptococcus pneumoniae - MIC*    ERYTHROMYCIN >=8 RESISTANT Resistant     LEVOFLOXACIN 1 SENSITIVE Sensitive     VANCOMYCIN 0.5 SENSITIVE Sensitive     PENO - penicillin 0.12      PENICILLIN (non-meningitis) 0.12 SENSITIVE Sensitive     PENICILLIN (oral) 0.12 INTERMEDIATE Intermediate     CEFTRIAXONE (non-meningitis) 0.25 SENSITIVE Sensitive     * STREPTOCOCCUS PNEUMONIAE  Blood Culture ID Panel (Reflexed)     Status: Abnormal   Collection Time: 07/17/22 10:06 PM  Result Value Ref Range Status   Enterococcus faecalis NOT DETECTED NOT DETECTED Final   Enterococcus Faecium NOT DETECTED NOT DETECTED Final   Listeria monocytogenes NOT DETECTED NOT DETECTED Final   Staphylococcus species NOT DETECTED NOT DETECTED Final   Staphylococcus aureus (BCID) NOT DETECTED NOT DETECTED Final   Staphylococcus epidermidis NOT DETECTED NOT DETECTED Final   Staphylococcus lugdunensis NOT DETECTED NOT DETECTED Final   Streptococcus species DETECTED (A) NOT DETECTED Final    Comment: CRITICAL RESULT CALLED TO, READ BACK BY AND VERIFIED WITH: CAROLINE CHILDEF AT 1649 ON 07/18/22 BY SS    Streptococcus agalactiae NOT DETECTED NOT DETECTED Final   Streptococcus pneumoniae DETECTED (A) NOT DETECTED Final    Comment: CRITICAL RESULT CALLED TO, READ BACK BY AND VERIFIED WITH: CAROLINE  CHILDEF AT 1649 ON 07/18/22 BY SS    Streptococcus pyogenes NOT DETECTED NOT DETECTED Final   A.calcoaceticus-baumannii NOT DETECTED NOT DETECTED Final   Bacteroides fragilis NOT DETECTED NOT DETECTED Final   Enterobacterales NOT DETECTED NOT DETECTED Final   Enterobacter cloacae complex NOT DETECTED NOT DETECTED Final   Escherichia coli NOT DETECTED NOT DETECTED Final   Klebsiella aerogenes NOT DETECTED NOT DETECTED Final   Klebsiella oxytoca NOT DETECTED NOT DETECTED Final   Klebsiella pneumoniae NOT DETECTED NOT DETECTED Final   Proteus species NOT DETECTED  NOT DETECTED Final   Salmonella species NOT DETECTED NOT DETECTED Final   Serratia marcescens NOT DETECTED NOT DETECTED Final   Haemophilus influenzae NOT DETECTED NOT DETECTED Final   Neisseria meningitidis NOT DETECTED NOT DETECTED Final   Pseudomonas aeruginosa NOT DETECTED NOT DETECTED Final   Stenotrophomonas maltophilia NOT DETECTED NOT DETECTED Final   Candida albicans NOT DETECTED NOT DETECTED Final   Candida auris NOT DETECTED NOT DETECTED Final   Candida glabrata NOT DETECTED NOT DETECTED Final   Candida krusei NOT DETECTED NOT DETECTED Final   Candida parapsilosis NOT DETECTED NOT DETECTED Final   Candida tropicalis NOT DETECTED NOT DETECTED Final   Cryptococcus neoformans/gattii NOT DETECTED NOT DETECTED Final    Comment: Performed at [facility], [place], [place] [address]  Resp Panel by RT-PCR (Flu A&B, Covid) Anterior Nasal Swab     Status: None   Collection Time: 07/17/22 11:05 PM   Specimen: Anterior Nasal Swab  Result Value Ref Range Status   SARS Coronavirus 2 by RT PCR NEGATIVE NEGATIVE Final    Comment: (NOTE) SARS-CoV-2 target nucleic acids are NOT DETECTED.  The SARS-CoV-2 RNA is generally detectable in upper respiratory specimens during the acute phase of infection. The lowest concentration of SARS-CoV-2 viral copies this assay can detect is 138 copies/mL. A negative result  does not preclude SARS-Cov-2 infection and should not be used as the sole basis for treatment or other patient management decisions. A negative result may occur with  improper specimen collection/handling, submission of specimen other than nasopharyngeal swab, presence of viral mutation(s) within the areas targeted by this assay, and inadequate number of viral copies(<138 copies/mL). A negative result must be combined with clinical observations, patient history, and epidemiological information. The expected result is Negative.  Fact Sheet for Patients:  EntrepreneurPulse.com.au  Fact Sheet for Healthcare Providers:  IncredibleEmployment.be  This test is no t yet approved or cleared by the [place] FDA and  has been authorized for detection and/or diagnosis of SARS-CoV-2 by FDA under an Emergency Use Authorization (EUA). This EUA will remain  in effect (meaning this test can be used) for the duration of the COVID-19 declaration under Section 564(b)(1) of the Act, 21 U.S.C.section 360bbb-3(b)(1), unless the authorization is terminated  or revoked sooner.       Influenza A by PCR NEGATIVE NEGATIVE Final   Influenza B by PCR NEGATIVE NEGATIVE Final    Comment: (NOTE) The Xpert Xpress SARS-CoV-2/FLU/RSV plus assay is intended as an aid in the diagnosis of influenza from Nasopharyngeal swab specimens and should not be used as a sole basis for treatment. Nasal washings and aspirates are unacceptable for Xpert Xpress SARS-CoV-2/FLU/RSV testing.  Fact Sheet for Patients: EntrepreneurPulse.com.au  Fact Sheet for Healthcare Providers: IncredibleEmployment.be  This test is not yet approved or cleared by the [place] FDA and has been authorized for detection and/or diagnosis of SARS-CoV-2 by FDA under an Emergency Use Authorization (EUA). This EUA will remain in effect (meaning this test can be used) for  the duration of the COVID-19 declaration under Section 564(b)(1) of the Act, 21 U.S.C. section 360bbb-3(b)(1), unless the authorization is terminated or revoked.  Performed at [facility], [address] [place],  [address]   MRSA Next Gen by PCR, Nasal     Status: Abnormal   Collection Time: 07/18/22  1:12 AM   Specimen: Nasal Mucosa; Nasal Swab  Result Value Ref Range Status   MRSA by PCR Next Gen DETECTED (A)  NOT DETECTED Final    Comment: RESULT CALLED TO, READ BACK BY AND VERIFIED WITH: VENESSA ASHLEY 07/18/22 0620 MW (NOTE) The GeneXpert MRSA Assay (FDA approved for NASAL specimens only), is one component of a comprehensive MRSA colonization surveillance program. It is not intended to diagnose MRSA infection nor to guide or monitor treatment for MRSA infections. Test performance is not FDA approved in patients less than 23 years old. Performed at [facility], [place], [place] [address]   Respiratory (~20 pathogens) panel by PCR     Status: None   Collection Time: 07/18/22  4:29 AM   Specimen: Nasopharyngeal Swab; Respiratory  Result Value Ref Range Status   Adenovirus NOT DETECTED NOT DETECTED Final   Coronavirus 229E NOT DETECTED NOT DETECTED Final    Comment: (NOTE) The Coronavirus on the Respiratory Panel, DOES NOT test for the novel  Coronavirus (2019 nCoV)    Coronavirus HKU1 NOT DETECTED NOT DETECTED Final   Coronavirus NL63 NOT DETECTED NOT DETECTED Final   Coronavirus OC43 NOT DETECTED NOT DETECTED Final   Metapneumovirus NOT DETECTED NOT DETECTED Final   Rhinovirus / Enterovirus NOT DETECTED NOT DETECTED Final   Influenza A NOT DETECTED NOT DETECTED Final   Influenza B NOT DETECTED NOT DETECTED Final   Parainfluenza Virus 1 NOT DETECTED NOT DETECTED Final   Parainfluenza Virus 2 NOT DETECTED NOT DETECTED Final   Parainfluenza Virus 3 NOT DETECTED NOT DETECTED Final   Parainfluenza Virus 4 NOT DETECTED NOT DETECTED  Final   Respiratory Syncytial Virus NOT DETECTED NOT DETECTED Final   Bordetella pertussis NOT DETECTED NOT DETECTED Final   Bordetella Parapertussis NOT DETECTED NOT DETECTED Final   Chlamydophila pneumoniae NOT DETECTED NOT DETECTED Final   Mycoplasma pneumoniae NOT DETECTED NOT DETECTED Final    Comment: Performed at [facility], [place]. [address] [place], [place] [address]  Urine Culture     Status: None   Collection Time: 07/18/22  7:59 AM   Specimen: Urine, Clean Catch  Result Value Ref Range Status   Specimen Description   Final    URINE, CLEAN CATCH Performed at [facility], [address], [place], [place] [address]    Special Requests   Final    NONE Performed at [facility], [address], [place], [place] [address]    Culture   Final    NO GROWTH Performed at [facility], [place] [address] [place], [place] [address]    Report Status 07/19/2022 FINAL  Final  Culture, blood (Routine X 2) w Reflex to ID Panel     Status: None (Preliminary result)   Collection Time: 07/19/22  2:22 PM   Specimen: BLOOD  Result Value Ref Range Status   Specimen Description BLOOD LEFT ANTECUBITAL  Final   Special Requests   Final    BOTTLES DRAWN AEROBIC AND ANAEROBIC Blood Culture results may not be optimal due to an inadequate volume of blood received in culture bottles   Culture   Final    NO GROWTH < 24 HOURS Performed at [facility], [address], [place],  [address]    Report Status PENDING  Incomplete  Culture, blood (Routine X 2) w Reflex to ID Panel     Status: None (Preliminary result)   Collection Time: 07/19/22  2:36 PM   Specimen: BLOOD  Result Value Ref Range Status   Specimen Description BLOOD BLOOD RIGHT HAND  Final   Special Requests  Final    BOTTLES DRAWN AEROBIC AND ANAEROBIC Blood Culture adequate volume   Culture   Final    NO GROWTH < 24 HOURS Performed at [facility], [address]  [place], [place] [address]    Report Status PENDING  Incomplete  Expectorated Sputum Assessment w Gram Stain, Rflx to Resp Cult     Status: None   Collection Time: 07/19/22  7:25 PM   Specimen: Sputum  Result Value Ref Range Status   Specimen Description SPUTUM  Final   Special Requests NONE  Final   Sputum evaluation   Final    THIS SPECIMEN IS ACCEPTABLE FOR SPUTUM CULTURE Performed at [facility], [address], [place], [place] [address]    Report Status 07/19/2022 FINAL  Final  Culture, Respiratory w Gram Stain     Status: None (Preliminary result)   Collection Time: 07/19/22  7:25 PM   Specimen: SPU  Result Value Ref Range Status   Specimen Description   Final    SPUTUM Performed at [facility], [address] [place], [place] [address]    Special Requests   Final    NONE Reflexed from [id number] Performed at [facility], [address] [place], [place] [address]    Gram Stain   Final    FEW WBC PRESENT, PREDOMINANTLY MONONUCLEAR MODERATE BUDDING YEAST SEEN FEW GRAM VARIABLE ROD Performed at [facility], [place] [address] [place], [place] [address]    Culture PENDING  Incomplete   Report Status PENDING  Incomplete         Radiology Studies: No results found.      Scheduled Meds:  budesonide (PULMICORT) nebulizer solution  0.25 mg Nebulization BID   enoxaparin (LOVENOX) injection  30 mg Subcutaneous Q24H   feeding supplement  237 mL Oral TID BM   guaiFENesin  600 mg Oral BID   insulin aspart  0-5 Units Subcutaneous QHS   insulin aspart  0-9 Units Subcutaneous TID WC   ipratropium-albuterol  3 mL Inhalation Q6H   multivitamin with minerals  1 tablet Oral Daily   mupirocin ointment  1 Application Nasal BID   pantoprazole  40 mg Oral Daily   predniSONE  40 mg Oral Q breakfast   Continuous Infusions:  cefTRIAXone (ROCEPHIN)  IV 2 g (07/20/22 0932)     LOS: 2 days    Desma Maxim, MD [facility]   If 7PM-7AM,  please contact night-coverage  07/20/2022, 4:58 PM

## 2022-07-21 ENCOUNTER — Telehealth (HOSPITAL_COMMUNITY): Payer: Self-pay | Admitting: Pharmacy Technician

## 2022-07-21 ENCOUNTER — Inpatient Hospital Stay: Payer: Medicare Other

## 2022-07-21 ENCOUNTER — Other Ambulatory Visit (HOSPITAL_COMMUNITY): Payer: Self-pay

## 2022-07-21 DIAGNOSIS — J189 Pneumonia, unspecified organism: Secondary | ICD-10-CM | POA: Diagnosis not present

## 2022-07-21 LAB — GLUCOSE, CAPILLARY
Glucose-Capillary: 71 mg/dL (ref 70–99)
Glucose-Capillary: 91 mg/dL (ref 70–99)
Glucose-Capillary: 117 mg/dL — ABNORMAL HIGH (ref 70–99)
Glucose-Capillary: 70 mg/dL (ref 70–99)
Glucose-Capillary: 129 mg/dL — ABNORMAL HIGH (ref 70–99)
Glucose-Capillary: 128 mg/dL — ABNORMAL HIGH (ref 70–99)

## 2022-07-21 LAB — BASIC METABOLIC PANEL
Anion gap: 8 (ref 5–15)
BUN: 22 mg/dL (ref 8–23)
CO2: 29 mmol/L (ref 22–32)
Calcium: 8.3 mg/dL — ABNORMAL LOW (ref 8.9–10.3)
Chloride: 100 mmol/L (ref 98–111)
Creatinine, Ser: 0.53 mg/dL (ref 0.44–1.00)
GFR, Estimated: >60 mL/min (ref >60)
Glucose, Bld: 91 mg/dL (ref 70–99)
Potassium: 4 mmol/L (ref 3.5–5.1)
Sodium: 137 mmol/L (ref 135–145)

## 2022-07-21 MED ORDER — AMOXICILLIN 500 MG PO CAPS: 1000.0000 mg | ORAL_CAPSULE | Freq: Three times a day (TID) | ORAL | Status: DC

## 2022-07-21 MED ORDER — AMOXICILLIN 500 MG PO CAPS
1000.0000 mg | ORAL_CAPSULE | Freq: Three times a day (TID) | ORAL | Status: DC
Start: 2022-07-22 — End: 2022-07-23
  Administered 2022-07-22 – 2022-07-23 (×5): 1000 mg via ORAL
  Filled 2022-07-21 (×5): qty 2

## 2022-07-21 MED ORDER — ALPRAZOLAM 0.25 MG PO TABS
0.2500 mg | ORAL_TABLET | Freq: Every evening | ORAL | Status: AC | PRN
  Administered 2022-07-21: 0.25 mg via ORAL
  Filled 2022-07-21: qty 1

## 2022-07-21 MED ORDER — IPRATROPIUM-ALBUTEROL 0.5-2.5 (3) MG/3ML IN SOLN
3.0000 mL | RESPIRATORY_TRACT | Status: DC | PRN
  Administered 2022-07-22: 3 mL via RESPIRATORY_TRACT
  Filled 2022-07-21: qty 3

## 2022-07-21 MED ORDER — LOPERAMIDE HCL 2 MG PO CAPS
2.0000 mg | ORAL_CAPSULE | ORAL | Status: DC | PRN
Start: 2022-07-21 — End: 2022-07-23

## 2022-07-21 MED ORDER — MAGIC MOUTHWASH
15.0000 mL | Freq: Four times a day (QID) | ORAL | Status: DC
  Administered 2022-07-21 – 2022-07-23 (×10): 15 mL via ORAL
  Filled 2022-07-21 (×12): qty 15

## 2022-07-21 NOTE — Progress Notes (Signed)
Physical Therapy Treatment Patient Details Name: Vanessa Oneal MRN: [id number] DOB: March 25, 1951 Today's Date: 07/21/2022   History of Present Illness 71 y.o. female with medical history significant of GERD,RA,Fibromyalgia,  tobacco abuse, H/o drug abuse (cocaine abuse), anxiety , depression, COPD who presents to ed with one week of cough /sob and generalized weakness.    PT Comments    Pt agreeable to therapy upon arrival to the room.  Pt was able to perform bed mobility well and once in standing she experienced a couple of dizzy spells.  Pt did not present with any symptoms such as labored breathing or SOB, but did have an incident in which the therapist had to assist to prevent uncontrolled descent to the floor.  Pt ambulates well with the RW, not utilizing it for support, more for reassurance.  Pt then transitioned back to the bed and all needs met.  Current discharge plans to home with HHPT remain appropriate at this time.  Pt will continue to benefit from skilled therapy in order to address deficits listed below.    Recommendations for follow up therapy are one component of a multi-disciplinary discharge planning process, led by the attending physician.  Recommendations may be updated based on patient status, additional functional criteria and insurance authorization.  Follow Up Recommendations  Home health PT     Assistance Recommended at Discharge Intermittent Supervision/Assistance  Patient can return home with the following A little help with walking and/or transfers;A little help with bathing/dressing/bathroom;Assist for transportation;Help with stairs or ramp for entrance   Equipment Recommendations  Rolling walker (2 wheels)    Recommendations for Other Services       Precautions / Restrictions Precautions Precautions: Fall Restrictions Weight Bearing Restrictions: No     Mobility  Bed Mobility Overal bed mobility: Independent             General bed  mobility comments: Pt able to get herself up to sitting w/o issue    Transfers Overall transfer level: Modified independent Equipment used: Rolling walker (2 wheels)               General transfer comment: Pt was able to rise to standing w/o assist, minimal UE reliance    Ambulation/Gait Ambulation/Gait assistance: Min guard Gait Distance (Feet): 160 Feet Assistive device: Rolling walker (2 wheels) Gait Pattern/deviations: Step-through pattern, Drifts right/left Gait velocity: decreased.     General Gait Details: Slow, and sometimes drifts, but overall steady.  Utilizes walker for reassurance more than support.   Stairs             Wheelchair Mobility    Modified Rankin (Stroke Patients Only)       Balance Overall balance assessment: Mild deficits observed, not formally tested                                          Cognition Arousal/Alertness: Awake/alert Behavior During Therapy: WFL for tasks assessed/performed Overall Cognitive Status: No family/caregiver present to determine baseline cognitive functioning                                 General Comments: Pt not talkative at all during session, very appreciative of therapist coming to see her.        Exercises      General Comments General comments (skin  integrity, edema, etc.): Pt had one incident in standing that pt lost her balance and therapist had to assist her to prevent uncontrolled descent.      Pertinent Vitals/Pain Pain Assessment Pain Assessment: 0-10 Pain Score: 4  Pain Location: ribs Pain Descriptors / Indicators: Aching, Sore Pain Intervention(s): Limited activity within patient's tolerance, Monitored during session    Home Living                          Prior Function            PT Goals (current goals can now be found in the care plan section) Acute Rehab PT Goals Patient Stated Goal: Go home PT Goal Formulation: With  patient Time For Goal Achievement: 07/31/22 Potential to Achieve Goals: Fair Progress towards PT goals: Progressing toward goals    Frequency    Min 2X/week      PT Plan Current plan remains appropriate    Co-evaluation              AM-PAC PT "6 Clicks" Mobility   Outcome Measure  Help needed turning from your back to your side while in a flat bed without using bedrails?: None Help needed moving from lying on your back to sitting on the side of a flat bed without using bedrails?: None Help needed moving to and from a bed to a chair (including a wheelchair)?: A Little Help needed standing up from a chair using your arms (e.g., wheelchair or bedside chair)?: None Help needed to walk in hospital room?: A Little Help needed climbing 3-5 steps with a railing? : A Lot 6 Click Score: 20    End of Session Equipment Utilized During Treatment: Gait belt;Oxygen Activity Tolerance: Patient tolerated treatment well;Patient limited by fatigue Patient left: with call bell/phone within reach;in bed Nurse Communication: Mobility status PT Visit Diagnosis: Unsteadiness on feet (R26.81);Muscle weakness (generalized) (M62.81)     Time: 1610-9604 PT Time Calculation (min) (ACUTE ONLY): 23 min  Charges:  $Gait Training: 23-37 mins                     Gwenlyn Saran, PT, DPT 07/21/22, 5:21 PM

## 2022-07-21 NOTE — Progress Notes (Signed)
[employer] Started on magic mouthwash for thrush Extra dose of xanax 0.25 mg given at bedtime for sleep

## 2022-07-21 NOTE — Progress Notes (Signed)
Pt refused nebulizers at this time

## 2022-07-21 NOTE — Progress Notes (Signed)
Pt refused nebulizer

## 2022-07-21 NOTE — Care Management Important Message (Signed)
Important Message  Patient Details  Name: MARWAH DISBRO MRN: [id number] Date of Birth: [birth year]   Medicare Important Message Given:  Yes     Johnell Comings 07/21/2022, 11:38 AM

## 2022-07-21 NOTE — Telephone Encounter (Signed)
Pharmacy Patient Advocate Encounter  Insurance verification completed.    The patient is insured through [employer]   The patient is currently admitted and ran test claims for the following: Trelegy, [place], Spirvia.  Copays and coinsurance results were relayed to Inpatient clinical team.

## 2022-07-21 NOTE — TOC Benefit Eligibility Note (Signed)
Patient Product/process development scientist completed.    The patient is currently admitted and upon discharge could be taking Trelegy 100-62.5-25 mcg.  The current 30 day co-pay is $0.00.   The patient is currently admitted and upon discharge could be taking Wixela 100-50 mcg.  The current 30 day co-pay is $0.00.   The patient is currently admitted and upon discharge could be taking Spiriva  The current 30 day co-pay is $0.00.   The patient is insured through [employer]    Roland Earl, CPhT Pharmacy Patient Advocate Specialist [facility] Patient Advocate Team Direct Number: [phone]  Fax: [phone]

## 2022-07-21 NOTE — Progress Notes (Signed)
PROGRESS NOTE    Vanessa Oneal  OFB:[id number] DOB: 1951-11-21 DOA: 07/17/2022 PCP: Valerie Roys, DO    Brief Narrative:  71 y.o. female with medical history significant of GERD,RA,Fibromyalgia,  tobacco abuse, H/o drug abuse (cocaine abuse), anxiety , depression, COPD who presents to ed with one week of cough /sob and generalized weakness. Patient BIB EMS , of note in the field patient was noted to have saturation of 80% on RA and poc blood glucose of 248. Per patient she has fall 5 days ago and after that note pain on right side with coughing. She notes sob and coughing has progressed since then.   Chest CT with extensive right upper middle and lower lobe infiltrates.  Underlying neoplastic process cannot be excluded.  Marked severity emphysematous lung disease background.   Assessment & Plan:   Principal Problem:   CAP (community acquired pneumonia) Active Problems:   Anxiety disorder   Centrilobular emphysema (HCC)   Malnutrition ([place])   Benzodiazepine abuse ([place])   Compression fracture of third lumbar vertebra (HCC)   Protein-calorie malnutrition, severe   Bacteremia due to Streptococcus pneumoniae  Community-acquired pneumonia Acute hypoxic respiratory failure Strep pneumo bacteremia Severe sepsis secondary to above Sepsis criteria met with leukocytosis, fever, tachypnea, lactic acidosis Initially on 6 L, weaned to 2 today but remains dyspneic, somewhat improved from yesterday Plan: Continue abx will switch from ceftriaxone to high-dose amoxicillin. Abx started 8/15, will plan for 7 day course Sensitivities have resulted, will discuss w/ pharm tomorrow transition to orals Sputum for culture, results pending Repeat blood cultures ordered, ngtd Continue supplemental oxygen, wean as tolerated Pulmonary toileting, encourage I-S and flutter valve use Monitor vitals and fever curve PT/OT advising HH PT/OT  Right anterior rib pain With associated tenderness. No  trauma, skin normal. Likely 2/2 coughing - will check CXR  Acute exacerbation of COPD Marked emphysematous changes noted on imaging Plan: P.o. prednisone 40 mg daily x5 days Every 6 hours DuoNebs Twice daily Pulmicort Sepsis treatment as above - controller triple therapy at d/c, pharmacy has determined that trelegy will have a zero dollar co-pay  Hyponatremia Resolved with hydration and food  Hyperglycemia A1c 5.1, nondiabetic No known history of diabetes 2/2 steroids, now much improved - cont  SSI  Suspected severe malnutrition RD consult requested, supplements ordered  Rheumatoid arthritis Fibromyalgia Unclear if patient actually takes medications for this.  None listed on her home medication list  Tobacco use Counseled patient on cessation  Anxiety Home benzo, ssri  GERD PPI  DVT prophylaxis: SQ Lovenox Code Status: Full.  Prior attending had a lengthy discussion with patient regarding her CODE STATUS.  Currently she confirms full code.  If clinical status deteriorates consider consultation with palliative care and further discussion regarding [facility] Family Communication: son updated telephonically 8/17. No answer when called today Remains inpatient appropriate because: ongoing respiratory distress   Level of care: Progressive  Consultants:  None  Procedures:  None  Antimicrobials: Ceftriaxone    Subjective: Seen and examined.  Feeling fatigued breathing somewhat improved. Pain right side when coughs  Objective: Vitals:   07/20/22 2311 07/21/22 0320 07/21/22 0339 07/21/22 0836  BP: 109/79 (!) 145/77  118/72  Pulse: 96 75  85  Resp: 19 (!) 22    Temp: (!) 97.5 F (36.4 C) 97.7 F (36.5 C)  (!) 97.5 F (36.4 C)  TempSrc: Oral Oral  Oral  SpO2: 97% 96%  (!) 89%  Weight:   35.9 kg   Height:  Intake/Output Summary (Last 24 hours) at 07/21/2022 1245 Last data filed at 07/21/2022 0955 Gross per 24 hour  Intake 480 ml  Output 0 ml  Net 480  ml   Filed Weights   07/19/22 0105 07/20/22 0500 07/21/22 0339  Weight: 39.5 kg 40.5 kg 35.9 kg    Examination:  General exam: No acute distress.  Appears frail and chronically ill Respiratory system: Diminished breath sounds bilaterally.  Scattered end expiratory wheeze.  right-sided crackles.  Mild dyspnea. Ttp right anterior lower chest wall Cardiovascular system: S1-S2, RRR, no murmurs, no pedal edema Gastrointestinal system: Thin/scaphoid, soft, NT/ND, normal bowel sounds 7 Central nervous system: Alert and oriented. No focal neurological deficits. Extremities: Good decreased power bilaterally.  Muscle wasting noted bilaterally Skin: Thin and pale with no obvious rashes or lesions Psychiatry: Judgement and insight appear normal. Mood & affect appropriate.     Data Reviewed: I have personally reviewed following labs and imaging studies  CBC: Recent Labs  Lab 07/17/22 2206 07/18/22 0112 07/18/22 0429  WBC 18.5* 21.3* 18.5*  NEUTROABS 16.7*  --   --   HGB 10.3* 10.5* 10.2*  HCT 29.5* 30.3* 29.5*  MCV 95.8 96.5 96.7  PLT 180 192 657   Basic Metabolic Panel: Recent Labs  Lab 07/17/22 2206 07/18/22 0112 07/18/22 0429 07/19/22 1422 07/20/22 0449 07/21/22 0425  NA 126*  --  131* 132* 134* 137  K 3.0*  --  2.9* 4.5 4.4 4.0  CL 91*  --  97* 104 105 100  CO2 26  --  25 20* 24 29  GLUCOSE 168*  --  145* 312* 127* 91  BUN 25*  --  18 26* 29* 22  CREATININE 0.71 0.61 0.58 0.61 0.53 0.53  CALCIUM 7.6*  --  7.7* 8.3* 8.1* 8.3*  MG  --   --   --  1.9  --   --    GFR: Estimated Creatinine Clearance: 37.1 mL/min (by C-G formula based on SCr of 0.53 mg/dL). Liver Function Tests: Recent Labs  Lab 07/17/22 2206 07/18/22 0429  AST 26 21  ALT 14 14  ALKPHOS 81 77  BILITOT 0.6 1.0  PROT 5.3* 5.1*  ALBUMIN 2.0* 1.8*   No results for input(s): "LIPASE", "AMYLASE" in the last 168 hours. No results for input(s): "AMMONIA" in the last 168 hours. Coagulation  Profile: Recent Labs  Lab 07/17/22 2206  INR 1.2   Cardiac Enzymes: No results for input(s): "CKTOTAL", "CKMB", "CKMBINDEX", "TROPONINI" in the last 168 hours. BNP (last 3 results) No results for input(s): "PROBNP" in the last 8760 hours. HbA1C: No results for input(s): "HGBA1C" in the last 72 hours.  CBG: Recent Labs  Lab 07/20/22 2035 07/21/22 0837 07/21/22 0904 07/21/22 1141 07/21/22 1211  GLUCAP 122* 71 91 70 117*   Lipid Profile: No results for input(s): "CHOL", "HDL", "LDLCALC", "TRIG", "CHOLHDL", "LDLDIRECT" in the last 72 hours. Thyroid Function Tests: No results for input(s): "TSH", "T4TOTAL", "FREET4", "T3FREE", "THYROIDAB" in the last 72 hours.  Anemia Panel: No results for input(s): "VITAMINB12", "FOLATE", "FERRITIN", "TIBC", "IRON", "RETICCTPCT" in the last 72 hours. Sepsis Labs: Recent Labs  Lab 07/17/22 2206 07/18/22 0112 07/18/22 0429 07/19/22 0548  PROCALCITON 3.69  --  3.24 2.03  LATICACIDVEN 2.2* 1.3 1.2  --     Recent Results (from the past 240 hour(s))  Blood Culture (routine x 2)     Status: Abnormal   Collection Time: 07/17/22 10:06 PM   Specimen: BLOOD  Result Value Ref Range  Status   Specimen Description   Final    BLOOD LEFT HAND Performed at [facility], [address] [place], [place] [address]    Special Requests   Final    BOTTLES DRAWN AEROBIC AND ANAEROBIC Blood Culture results may not be optimal due to an inadequate volume of blood received in culture bottles Performed at [facility], [address] [place], [place] [address]    Culture  Setup Time   Final    GRAM POSITIVE COCCI IN BOTH AEROBIC AND ANAEROBIC BOTTLES CRITICAL VALUE NOTED.  VALUE IS CONSISTENT WITH PREVIOUSLY REPORTED AND CALLED VALUE.    Culture (A)  Final    STREPTOCOCCUS PNEUMONIAE SUSCEPTIBILITIES PERFORMED ON PREVIOUS CULTURE WITHIN THE LAST 5 DAYS. Performed at [facility], [place] [address], [place], [place] [address]     Report Status 07/20/2022 FINAL  Final  Blood Culture (routine x 2)     Status: Abnormal   Collection Time: 07/17/22 10:06 PM   Specimen: BLOOD  Result Value Ref Range Status   Specimen Description   Final    BLOOD RIGHT FOREARM Performed at [facility], [address] [place], [place] [address]    Special Requests   Final    BOTTLES DRAWN AEROBIC AND ANAEROBIC Blood Culture results may not be optimal due to an inadequate volume of blood received in culture bottles Performed at [facility], [address] [place], [place] [address]    Culture  Setup Time   Final    AEROBIC BOTTLE ONLY GRAM POSITIVE COCCI CRITICAL RESULT CALLED TO, READ BACK BY AND VERIFIED WITH: CAROLINE CHILDEF AT 2952 ON 07/18/22 BY SS    Culture STREPTOCOCCUS PNEUMONIAE (A)  Final   Report Status 07/20/2022 FINAL  Final   Organism ID, Bacteria STREPTOCOCCUS PNEUMONIAE  Final      Susceptibility   Streptococcus pneumoniae - MIC*    ERYTHROMYCIN >=8 RESISTANT Resistant     LEVOFLOXACIN 1 SENSITIVE Sensitive     VANCOMYCIN 0.5 SENSITIVE Sensitive     PENO - penicillin 0.12      PENICILLIN (non-meningitis) 0.12 SENSITIVE Sensitive     PENICILLIN (oral) 0.12 INTERMEDIATE Intermediate     CEFTRIAXONE (non-meningitis) 0.25 SENSITIVE Sensitive     * STREPTOCOCCUS PNEUMONIAE  Blood Culture ID Panel (Reflexed)     Status: Abnormal   Collection Time: 07/17/22 10:06 PM  Result Value Ref Range Status   Enterococcus faecalis NOT DETECTED NOT DETECTED Final   Enterococcus Faecium NOT DETECTED NOT DETECTED Final   Listeria monocytogenes NOT DETECTED NOT DETECTED Final   Staphylococcus species NOT DETECTED NOT DETECTED Final   Staphylococcus aureus (BCID) NOT DETECTED NOT DETECTED Final   Staphylococcus epidermidis NOT DETECTED NOT DETECTED Final   Staphylococcus lugdunensis NOT DETECTED NOT DETECTED Final   Streptococcus species DETECTED (A) NOT DETECTED Final    Comment: CRITICAL RESULT CALLED TO,  READ BACK BY AND VERIFIED WITH: CAROLINE CHILDEF AT 1649 ON 07/18/22 BY SS    Streptococcus agalactiae NOT DETECTED NOT DETECTED Final   Streptococcus pneumoniae DETECTED (A) NOT DETECTED Final    Comment: CRITICAL RESULT CALLED TO, READ BACK BY AND VERIFIED WITH: CAROLINE CHILDEF AT 1649 ON 07/18/22 BY SS    Streptococcus pyogenes NOT DETECTED NOT DETECTED Final   A.calcoaceticus-baumannii NOT DETECTED NOT DETECTED Final   Bacteroides fragilis NOT DETECTED NOT DETECTED Final   Enterobacterales NOT DETECTED NOT DETECTED Final   Enterobacter cloacae complex NOT DETECTED NOT DETECTED Final  Escherichia coli NOT DETECTED NOT DETECTED Final   Klebsiella aerogenes NOT DETECTED NOT DETECTED Final   Klebsiella oxytoca NOT DETECTED NOT DETECTED Final   Klebsiella pneumoniae NOT DETECTED NOT DETECTED Final   Proteus species NOT DETECTED NOT DETECTED Final   Salmonella species NOT DETECTED NOT DETECTED Final   Serratia marcescens NOT DETECTED NOT DETECTED Final   Haemophilus influenzae NOT DETECTED NOT DETECTED Final   Neisseria meningitidis NOT DETECTED NOT DETECTED Final   Pseudomonas aeruginosa NOT DETECTED NOT DETECTED Final   Stenotrophomonas maltophilia NOT DETECTED NOT DETECTED Final   Candida albicans NOT DETECTED NOT DETECTED Final   Candida auris NOT DETECTED NOT DETECTED Final   Candida glabrata NOT DETECTED NOT DETECTED Final   Candida krusei NOT DETECTED NOT DETECTED Final   Candida parapsilosis NOT DETECTED NOT DETECTED Final   Candida tropicalis NOT DETECTED NOT DETECTED Final   Cryptococcus neoformans/gattii NOT DETECTED NOT DETECTED Final    Comment: Performed at [facility], [address] [place], [place] [address]  Resp Panel by RT-PCR (Flu A&B, Covid) Anterior Nasal Swab     Status: None   Collection Time: 07/17/22 11:05 PM   Specimen: Anterior Nasal Swab  Result Value Ref Range Status   SARS Coronavirus 2 by RT PCR NEGATIVE NEGATIVE Final    Comment:  (NOTE) SARS-CoV-2 target nucleic acids are NOT DETECTED.  The SARS-CoV-2 RNA is generally detectable in upper respiratory specimens during the acute phase of infection. The lowest concentration of SARS-CoV-2 viral copies this assay can detect is 138 copies/mL. A negative result does not preclude SARS-Cov-2 infection and should not be used as the sole basis for treatment or other patient management decisions. A negative result may occur with  improper specimen collection/handling, submission of specimen other than nasopharyngeal swab, presence of viral mutation(s) within the areas targeted by this assay, and inadequate number of viral copies(<138 copies/mL). A negative result must be combined with clinical observations, patient history, and epidemiological information. The expected result is Negative.  Fact Sheet for Patients:  EntrepreneurPulse.com.au  Fact Sheet for Healthcare Providers:  IncredibleEmployment.be  This test is no t yet approved or cleared by the [place] FDA and  has been authorized for detection and/or diagnosis of SARS-CoV-2 by FDA under an Emergency Use Authorization (EUA). This EUA will remain  in effect (meaning this test can be used) for the duration of the COVID-19 declaration under Section 564(b)(1) of the Act, 21 U.S.C.section 360bbb-3(b)(1), unless the authorization is terminated  or revoked sooner.       Influenza A by PCR NEGATIVE NEGATIVE Final   Influenza B by PCR NEGATIVE NEGATIVE Final    Comment: (NOTE) The Xpert Xpress SARS-CoV-2/FLU/RSV plus assay is intended as an aid in the diagnosis of influenza from Nasopharyngeal swab specimens and should not be used as a sole basis for treatment. Nasal washings and aspirates are unacceptable for Xpert Xpress SARS-CoV-2/FLU/RSV testing.  Fact Sheet for Patients: EntrepreneurPulse.com.au  Fact Sheet for Healthcare  Providers: IncredibleEmployment.be  This test is not yet approved or cleared by the [place] FDA and has been authorized for detection and/or diagnosis of SARS-CoV-2 by FDA under an Emergency Use Authorization (EUA). This EUA will remain in effect (meaning this test can be used) for the duration of the COVID-19 declaration under Section 564(b)(1) of the Act, 21 U.S.C. section 360bbb-3(b)(1), unless the authorization is terminated or revoked.  Performed at [facility], [address] [place], [place] [address]   MRSA Next  Gen by PCR, Nasal     Status: Abnormal   Collection Time: 07/18/22  1:12 AM   Specimen: Nasal Mucosa; Nasal Swab  Result Value Ref Range Status   MRSA by PCR Next Gen DETECTED (A) NOT DETECTED Final    Comment: RESULT CALLED TO, READ BACK BY AND VERIFIED WITH: VENESSA ASHLEY 07/18/22 0620 MW (NOTE) The GeneXpert MRSA Assay (FDA approved for NASAL specimens only), is one component of a comprehensive MRSA colonization surveillance program. It is not intended to diagnose MRSA infection nor to guide or monitor treatment for MRSA infections. Test performance is not FDA approved in patients less than 70 years old. Performed at [facility], [place], [place] [address]   Respiratory (~20 pathogens) panel by PCR     Status: None   Collection Time: 07/18/22  4:29 AM   Specimen: Nasopharyngeal Swab; Respiratory  Result Value Ref Range Status   Adenovirus NOT DETECTED NOT DETECTED Final   Coronavirus 229E NOT DETECTED NOT DETECTED Final    Comment: (NOTE) The Coronavirus on the Respiratory Panel, DOES NOT test for the novel  Coronavirus (2019 nCoV)    Coronavirus HKU1 NOT DETECTED NOT DETECTED Final   Coronavirus NL63 NOT DETECTED NOT DETECTED Final   Coronavirus OC43 NOT DETECTED NOT DETECTED Final   Metapneumovirus NOT DETECTED NOT DETECTED Final   Rhinovirus / Enterovirus NOT DETECTED NOT DETECTED Final    Influenza A NOT DETECTED NOT DETECTED Final   Influenza B NOT DETECTED NOT DETECTED Final   Parainfluenza Virus 1 NOT DETECTED NOT DETECTED Final   Parainfluenza Virus 2 NOT DETECTED NOT DETECTED Final   Parainfluenza Virus 3 NOT DETECTED NOT DETECTED Final   Parainfluenza Virus 4 NOT DETECTED NOT DETECTED Final   Respiratory Syncytial Virus NOT DETECTED NOT DETECTED Final   Bordetella pertussis NOT DETECTED NOT DETECTED Final   Bordetella Parapertussis NOT DETECTED NOT DETECTED Final   Chlamydophila pneumoniae NOT DETECTED NOT DETECTED Final   Mycoplasma pneumoniae NOT DETECTED NOT DETECTED Final    Comment: Performed at [facility], [place] [place], [place] [address]  Urine Culture     Status: None   Collection Time: 07/18/22  7:59 AM   Specimen: Urine, Clean Catch  Result Value Ref Range Status   Specimen Description   Final    URINE, CLEAN CATCH Performed at [facility], [address], [place], [place] [address]    Special Requests   Final    NONE Performed at [facility], [address], [place], [place] [address]    Culture   Final    NO GROWTH Performed at [facility], [place] [address] [place], [place] [address]    Report Status 07/19/2022 FINAL  Final  Culture, blood (Routine X 2) w Reflex to ID Panel     Status: None (Preliminary result)   Collection Time: 07/19/22  2:22 PM   Specimen: BLOOD  Result Value Ref Range Status   Specimen Description BLOOD LEFT ANTECUBITAL  Final   Special Requests   Final    BOTTLES DRAWN AEROBIC AND ANAEROBIC Blood Culture results may not be optimal due to an inadequate volume of blood received in culture bottles   Culture   Final    NO GROWTH 2 DAYS Performed at [facility], [address], [place], [place] [address]    Report Status PENDING  Incomplete  Culture, blood (Routine X 2) w Reflex to ID Panel  Status: None (Preliminary result)   Collection Time: 07/19/22  2:36 PM    Specimen: BLOOD  Result Value Ref Range Status   Specimen Description BLOOD BLOOD RIGHT HAND  Final   Special Requests   Final    BOTTLES DRAWN AEROBIC AND ANAEROBIC Blood Culture adequate volume   Culture   Final    NO GROWTH 2 DAYS Performed at [facility], [address] [place], [place] [address]    Report Status PENDING  Incomplete  Expectorated Sputum Assessment w Gram Stain, Rflx to Resp Cult     Status: None   Collection Time: 07/19/22  7:25 PM   Specimen: Sputum  Result Value Ref Range Status   Specimen Description SPUTUM  Final   Special Requests NONE  Final   Sputum evaluation   Final    THIS SPECIMEN IS ACCEPTABLE FOR SPUTUM CULTURE Performed at [facility], [address], [place], [place] [address]    Report Status 07/19/2022 FINAL  Final  Culture, Respiratory w Gram Stain     Status: None (Preliminary result)   Collection Time: 07/19/22  7:25 PM   Specimen: SPU  Result Value Ref Range Status   Specimen Description   Final    SPUTUM Performed at [facility], [address] [place], [place] [address]    Special Requests   Final    NONE Reflexed from [id number] Performed at [facility], [address] [place], [place] [address]    Gram Stain   Final    FEW WBC PRESENT, PREDOMINANTLY MONONUCLEAR MODERATE BUDDING YEAST SEEN FEW GRAM VARIABLE ROD Performed at [facility], [place] [address] [place], [place] [address]    Culture PENDING  Incomplete   Report Status PENDING  Incomplete         Radiology Studies: No results found.      Scheduled Meds:  budesonide (PULMICORT) nebulizer solution  0.25 mg Nebulization BID   enoxaparin (LOVENOX) injection  30 mg Subcutaneous Q24H   feeding supplement  237 mL Oral TID BM   guaiFENesin  600 mg Oral BID   insulin aspart  0-5 Units Subcutaneous QHS   insulin aspart  0-9 Units Subcutaneous TID WC   ipratropium-albuterol  3 mL Inhalation TID   magic mouthwash  15  mL Oral QID   multivitamin with minerals  1 tablet Oral Daily   mupirocin ointment  1 Application Nasal BID   pantoprazole  40 mg Oral Daily   predniSONE  40 mg Oral Q breakfast   Continuous Infusions:  cefTRIAXone (ROCEPHIN)  IV 2 g (07/21/22 1020)     LOS: 3 days    Desma Maxim, MD [facility]   If 7PM-7AM, please contact night-coverage  07/21/2022, 12:45 PM

## 2022-07-22 ENCOUNTER — Inpatient Hospital Stay: Payer: Medicare Other

## 2022-07-22 LAB — GLUCOSE, CAPILLARY
Glucose-Capillary: 116 mg/dL — ABNORMAL HIGH (ref 70–99)
Glucose-Capillary: 205 mg/dL — ABNORMAL HIGH (ref 70–99)
Glucose-Capillary: 208 mg/dL — ABNORMAL HIGH (ref 70–99)

## 2022-07-22 MED ORDER — PREDNISONE 20 MG PO TABS
40.0000 mg | ORAL_TABLET | Freq: Every day | ORAL | Status: DC
  Administered 2022-07-23: 40 mg via ORAL
  Filled 2022-07-22: qty 2

## 2022-07-22 NOTE — Progress Notes (Signed)
Called into room. C/o difficulty breathing. Night nurse in room increased 02 to 3l. Vital signs taken sat 97% on 3l. Prn breathing treatment given. Patient c/o right side pain. 8/10

## 2022-07-22 NOTE — Progress Notes (Signed)
SATURATION QUALIFICATIONS: (This note is used to comply with regulatory documentation for home oxygen)  Patient Saturations on Room Air at Rest = 92%  Patient Saturations on Room Air while Ambulating = 88%  Patient Saturations on 2 Liters of oxygen while Ambulating = 95%  Please briefly explain why patient needs home oxygen:

## 2022-07-22 NOTE — Progress Notes (Signed)
Made Dr. Ashok Pall aware patient c/o right side pain 8/10, only prn tylenol order, minimal relief. Patient also c/o nausea, prn zofran given no relief. Patient asking for phenergan

## 2022-07-22 NOTE — Progress Notes (Signed)
PROGRESS NOTE    Vanessa Oneal  HGD:[id number] DOB: September 02, 1951 DOA: 07/17/2022 PCP: Valerie Roys, DO    Brief Narrative:  71 y.o. female with medical history significant of GERD,RA,Fibromyalgia,  tobacco abuse, H/o drug abuse (cocaine abuse), anxiety , depression, COPD who presents to ed with one week of cough /sob and generalized weakness. Patient BIB EMS , of note in the field patient was noted to have saturation of 80% on RA and poc blood glucose of 248. Per patient she has fall 5 days ago and after that note pain on right side with coughing. She notes sob and coughing has progressed since then.   Chest CT with extensive right upper middle and lower lobe infiltrates.  Underlying neoplastic process cannot be excluded.  Marked severity emphysematous lung disease background.   Assessment & Plan:   Principal Problem:   CAP (community acquired pneumonia) Active Problems:   Anxiety disorder   Centrilobular emphysema (HCC)   Malnutrition ([place])   Benzodiazepine abuse ([place])   Compression fracture of third lumbar vertebra (HCC)   Protein-calorie malnutrition, severe   Bacteremia due to Streptococcus pneumoniae  Community-acquired pneumonia Acute hypoxic respiratory failure Strep pneumo bacteremia Severe sepsis secondary to above Sepsis criteria met with leukocytosis, fever, tachypnea, lactic acidosis Initially on 6 L, weaned to 2 today, dyspnea much improved Continue abx will switch from ceftriaxone to high-dose amoxicillin. Abx started 8/15, will plan for 7 day course Sensitivities have resulted, will discuss w/ pharm tomorrow transition to orals Sputum for culture, results pending Repeat blood cultures ordered, ngtd Continue supplemental oxygen, wean as tolerated Pulmonary toileting, encourage I-S and flutter valve use Monitor vitals and fever curve PT/OT advising HH PT/OT Nursing to qualify for home o2  Right anterior rib pain With associated tenderness. No trauma, skin  normal. Likely 2/2 coughing. Dedicated rib films neg for fx  Acute exacerbation of COPD Marked emphysematous changes noted on imaging Plan: P.o. prednisone 40 mg daily x5 days Every 6 hours DuoNebs Twice daily Pulmicort Sepsis treatment as above - controller triple therapy at d/c, pharmacy has determined that trelegy will have a zero dollar co-pay  Hyponatremia Resolved with hydration and food  Hyperglycemia A1c 5.1, nondiabetic No known history of diabetes 2/2 steroids, now much improved  Suspected severe malnutrition RD consult requested, supplements ordered  Rheumatoid arthritis Fibromyalgia Unclear if patient actually takes medications for this.  None listed on her home medication list  Tobacco use Counseled patient on cessation  Anxiety Home benzo, ssri  GERD PPI  DVT prophylaxis: SQ Lovenox Code Status: Full.  Prior attending had a lengthy discussion with patient regarding her CODE STATUS.  Currently she confirms full code.  If clinical status deteriorates consider consultation with palliative care and further discussion regarding [place] Family Communication: son updated telephonically 8/17. Belligerent when spoken with today   Level of care: Progressive  Consultants:  None  Procedures:  None  Antimicrobials: Ceftriaxone    Subjective: Seen and examined.  Feeling fatigued breathing somewhat improved. Pain right side when coughs, improving. Dyspnea improving.  Objective: Vitals:   07/22/22 0337 07/22/22 0729 07/22/22 1247 07/22/22 1424  BP: 134/82 (!) 147/87 110/78   Pulse: 82 99 92   Resp: (!) 22 (!) 21 19   Temp: 97.8 F (36.6 C) 97.9 F (36.6 C) 97.8 F (36.6 C)   TempSrc:  Axillary Oral   SpO2: 97% 97% 96% 92%  Weight:      Height:        Intake/Output  Summary (Last 24 hours) at 07/22/2022 1425 Last data filed at 07/22/2022 1230 Gross per 24 hour  Intake --  Output 200 ml  Net -200 ml   Filed Weights   07/19/22 0105 07/20/22 0500  07/21/22 0339  Weight: 39.5 kg 40.5 kg 35.9 kg    Examination:  General exam: No acute distress.  Appears frail and chronically ill Respiratory system: Diminished breath sounds bilaterally.  Scattered end expiratory wheeze.  right-sided crackles.  Mild dyspnea improving. Ttp right anterior lower chest wall Cardiovascular system: S1-S2, RRR, no murmurs, no pedal edema Gastrointestinal system: Thin/scaphoid, soft, NT/ND, normal bowel sounds 7 Central nervous system: Alert and oriented. No focal neurological deficits. Extremities: Good decreased power bilaterally.  Muscle wasting noted bilaterally Skin: Thin and pale with no obvious rashes or lesions Psychiatry: Judgement and insight appear normal. Appears depressed, anxious.    Data Reviewed: I have personally reviewed following labs and imaging studies  CBC: Recent Labs  Lab 07/17/22 2206 07/18/22 0112 07/18/22 0429  WBC 18.5* 21.3* 18.5*  NEUTROABS 16.7*  --   --   HGB 10.3* 10.5* 10.2*  HCT 29.5* 30.3* 29.5*  MCV 95.8 96.5 96.7  PLT 180 192 812   Basic Metabolic Panel: Recent Labs  Lab 07/17/22 2206 07/18/22 0112 07/18/22 0429 07/19/22 1422 07/20/22 0449 07/21/22 0425  NA 126*  --  131* 132* 134* 137  K 3.0*  --  2.9* 4.5 4.4 4.0  CL 91*  --  97* 104 105 100  CO2 26  --  25 20* 24 29  GLUCOSE 168*  --  145* 312* 127* 91  BUN 25*  --  18 26* 29* 22  CREATININE 0.71 0.61 0.58 0.61 0.53 0.53  CALCIUM 7.6*  --  7.7* 8.3* 8.1* 8.3*  MG  --   --   --  1.9  --   --    GFR: Estimated Creatinine Clearance: 37.1 mL/min (by C-G formula based on SCr of 0.53 mg/dL). Liver Function Tests: Recent Labs  Lab 07/17/22 2206 07/18/22 0429  AST 26 21  ALT 14 14  ALKPHOS 81 77  BILITOT 0.6 1.0  PROT 5.3* 5.1*  ALBUMIN 2.0* 1.8*   No results for input(s): "LIPASE", "AMYLASE" in the last 168 hours. No results for input(s): "AMMONIA" in the last 168 hours. Coagulation Profile: Recent Labs  Lab 07/17/22 2206  INR 1.2    Cardiac Enzymes: No results for input(s): "CKTOTAL", "CKMB", "CKMBINDEX", "TROPONINI" in the last 168 hours. BNP (last 3 results) No results for input(s): "PROBNP" in the last 8760 hours. HbA1C: No results for input(s): "HGBA1C" in the last 72 hours.  CBG: Recent Labs  Lab 07/21/22 1211 07/21/22 1545 07/21/22 2110 07/22/22 0745 07/22/22 1131  GLUCAP 117* 129* 128* 116* 205*   Lipid Profile: No results for input(s): "CHOL", "HDL", "LDLCALC", "TRIG", "CHOLHDL", "LDLDIRECT" in the last 72 hours. Thyroid Function Tests: No results for input(s): "TSH", "T4TOTAL", "FREET4", "T3FREE", "THYROIDAB" in the last 72 hours.  Anemia Panel: No results for input(s): "VITAMINB12", "FOLATE", "FERRITIN", "TIBC", "IRON", "RETICCTPCT" in the last 72 hours. Sepsis Labs: Recent Labs  Lab 07/17/22 2206 07/18/22 0112 07/18/22 0429 07/19/22 0548  PROCALCITON 3.69  --  3.24 2.03  LATICACIDVEN 2.2* 1.3 1.2  --     Recent Results (from the past 240 hour(s))  Blood Culture (routine x 2)     Status: Abnormal   Collection Time: 07/17/22 10:06 PM   Specimen: BLOOD  Result Value Ref Range Status  Specimen Description   Final    BLOOD LEFT HAND Performed at [facility], [address] [place], [place] [address]    Special Requests   Final    BOTTLES DRAWN AEROBIC AND ANAEROBIC Blood Culture results may not be optimal due to an inadequate volume of blood received in culture bottles Performed at [facility], [address] [place], [place] [address]    Culture  Setup Time   Final    GRAM POSITIVE COCCI IN BOTH AEROBIC AND ANAEROBIC BOTTLES CRITICAL VALUE NOTED.  VALUE IS CONSISTENT WITH PREVIOUSLY REPORTED AND CALLED VALUE.    Culture (A)  Final    STREPTOCOCCUS PNEUMONIAE SUSCEPTIBILITIES PERFORMED ON PREVIOUS CULTURE WITHIN THE LAST 5 DAYS. Performed at [facility], [place] [address] [place], [place] [address]    Report Status 07/20/2022 FINAL  Final  Blood  Culture (routine x 2)     Status: Abnormal   Collection Time: 07/17/22 10:06 PM   Specimen: BLOOD  Result Value Ref Range Status   Specimen Description   Final    BLOOD RIGHT FOREARM Performed at [facility], [address] [place], [place] [address]    Special Requests   Final    BOTTLES DRAWN AEROBIC AND ANAEROBIC Blood Culture results may not be optimal due to an inadequate volume of blood received in culture bottles Performed at [facility], [address] [place],  [address]    Culture  Setup Time   Final    AEROBIC BOTTLE ONLY GRAM POSITIVE COCCI CRITICAL RESULT CALLED TO, READ BACK BY AND VERIFIED WITH: CAROLINE CHILDEF AT 2694 ON 07/18/22 BY SS    Culture STREPTOCOCCUS PNEUMONIAE (A)  Final   Report Status 07/20/2022 FINAL  Final   Organism ID, Bacteria STREPTOCOCCUS PNEUMONIAE  Final      Susceptibility   Streptococcus pneumoniae - MIC*    ERYTHROMYCIN >=8 RESISTANT Resistant     LEVOFLOXACIN 1 SENSITIVE Sensitive     VANCOMYCIN 0.5 SENSITIVE Sensitive     PENO - penicillin 0.12      PENICILLIN (non-meningitis) 0.12 SENSITIVE Sensitive     PENICILLIN (oral) 0.12 INTERMEDIATE Intermediate     CEFTRIAXONE (non-meningitis) 0.25 SENSITIVE Sensitive     * STREPTOCOCCUS PNEUMONIAE  Blood Culture ID Panel (Reflexed)     Status: Abnormal   Collection Time: 07/17/22 10:06 PM  Result Value Ref Range Status   Enterococcus faecalis NOT DETECTED NOT DETECTED Final   Enterococcus Faecium NOT DETECTED NOT DETECTED Final   Listeria monocytogenes NOT DETECTED NOT DETECTED Final   Staphylococcus species NOT DETECTED NOT DETECTED Final   Staphylococcus aureus (BCID) NOT DETECTED NOT DETECTED Final   Staphylococcus epidermidis NOT DETECTED NOT DETECTED Final   Staphylococcus lugdunensis NOT DETECTED NOT DETECTED Final   Streptococcus species DETECTED (A) NOT DETECTED Final    Comment: CRITICAL RESULT CALLED TO, READ BACK BY AND VERIFIED WITH: CAROLINE  CHILDEF AT 1649 ON 07/18/22 BY SS    Streptococcus agalactiae NOT DETECTED NOT DETECTED Final   Streptococcus pneumoniae DETECTED (A) NOT DETECTED Final    Comment: CRITICAL RESULT CALLED TO, READ BACK BY AND VERIFIED WITH: CAROLINE CHILDEF AT 1649 ON 07/18/22 BY SS    Streptococcus pyogenes NOT DETECTED NOT DETECTED Final   A.calcoaceticus-baumannii NOT DETECTED NOT DETECTED Final   Bacteroides fragilis NOT DETECTED NOT DETECTED Final   Enterobacterales NOT DETECTED NOT DETECTED Final   Enterobacter cloacae complex NOT DETECTED NOT DETECTED Final   Escherichia  coli NOT DETECTED NOT DETECTED Final   Klebsiella aerogenes NOT DETECTED NOT DETECTED Final   Klebsiella oxytoca NOT DETECTED NOT DETECTED Final   Klebsiella pneumoniae NOT DETECTED NOT DETECTED Final   Proteus species NOT DETECTED NOT DETECTED Final   Salmonella species NOT DETECTED NOT DETECTED Final   Serratia marcescens NOT DETECTED NOT DETECTED Final   Haemophilus influenzae NOT DETECTED NOT DETECTED Final   Neisseria meningitidis NOT DETECTED NOT DETECTED Final   Pseudomonas aeruginosa NOT DETECTED NOT DETECTED Final   Stenotrophomonas maltophilia NOT DETECTED NOT DETECTED Final   Candida albicans NOT DETECTED NOT DETECTED Final   Candida auris NOT DETECTED NOT DETECTED Final   Candida glabrata NOT DETECTED NOT DETECTED Final   Candida krusei NOT DETECTED NOT DETECTED Final   Candida parapsilosis NOT DETECTED NOT DETECTED Final   Candida tropicalis NOT DETECTED NOT DETECTED Final   Cryptococcus neoformans/gattii NOT DETECTED NOT DETECTED Final    Comment: Performed at [facility], [address] [place], [place] [address]  Resp Panel by RT-PCR (Flu A&B, Covid) Anterior Nasal Swab     Status: None   Collection Time: 07/17/22 11:05 PM   Specimen: Anterior Nasal Swab  Result Value Ref Range Status   SARS Coronavirus 2 by RT PCR NEGATIVE NEGATIVE Final    Comment: (NOTE) SARS-CoV-2 target nucleic acids are NOT  DETECTED.  The SARS-CoV-2 RNA is generally detectable in upper respiratory specimens during the acute phase of infection. The lowest concentration of SARS-CoV-2 viral copies this assay can detect is 138 copies/mL. A negative result does not preclude SARS-Cov-2 infection and should not be used as the sole basis for treatment or other patient management decisions. A negative result may occur with  improper specimen collection/handling, submission of specimen other than nasopharyngeal swab, presence of viral mutation(s) within the areas targeted by this assay, and inadequate number of viral copies(<138 copies/mL). A negative result must be combined with clinical observations, patient history, and epidemiological information. The expected result is Negative.  Fact Sheet for Patients:  EntrepreneurPulse.com.au  Fact Sheet for Healthcare Providers:  IncredibleEmployment.be  This test is no t yet approved or cleared by the [place] FDA and  has been authorized for detection and/or diagnosis of SARS-CoV-2 by FDA under an Emergency Use Authorization (EUA). This EUA will remain  in effect (meaning this test can be used) for the duration of the COVID-19 declaration under Section 564(b)(1) of the Act, 21 U.S.C.section 360bbb-3(b)(1), unless the authorization is terminated  or revoked sooner.       Influenza A by PCR NEGATIVE NEGATIVE Final   Influenza B by PCR NEGATIVE NEGATIVE Final    Comment: (NOTE) The Xpert Xpress SARS-CoV-2/FLU/RSV plus assay is intended as an aid in the diagnosis of influenza from Nasopharyngeal swab specimens and should not be used as a sole basis for treatment. Nasal washings and aspirates are unacceptable for Xpert Xpress SARS-CoV-2/FLU/RSV testing.  Fact Sheet for Patients: EntrepreneurPulse.com.au  Fact Sheet for Healthcare Providers: IncredibleEmployment.be  This test is not yet  approved or cleared by the [place] FDA and has been authorized for detection and/or diagnosis of SARS-CoV-2 by FDA under an Emergency Use Authorization (EUA). This EUA will remain in effect (meaning this test can be used) for the duration of the COVID-19 declaration under Section 564(b)(1) of the Act, 21 U.S.C. section 360bbb-3(b)(1), unless the authorization is terminated or revoked.  Performed at [facility], [address] [place], [place] [address]   MRSA Next Gen  by PCR, Nasal     Status: Abnormal   Collection Time: 07/18/22  1:12 AM   Specimen: Nasal Mucosa; Nasal Swab  Result Value Ref Range Status   MRSA by PCR Next Gen DETECTED (A) NOT DETECTED Final    Comment: RESULT CALLED TO, READ BACK BY AND VERIFIED WITH: VENESSA ASHLEY 07/18/22 0620 MW (NOTE) The GeneXpert MRSA Assay (FDA approved for NASAL specimens only), is one component of a comprehensive MRSA colonization surveillance program. It is not intended to diagnose MRSA infection nor to guide or monitor treatment for MRSA infections. Test performance is not FDA approved in patients less than 22 years old. Performed at [facility], [place], [place] [address]   Respiratory (~20 pathogens) panel by PCR     Status: None   Collection Time: 07/18/22  4:29 AM   Specimen: Nasopharyngeal Swab; Respiratory  Result Value Ref Range Status   Adenovirus NOT DETECTED NOT DETECTED Final   Coronavirus 229E NOT DETECTED NOT DETECTED Final    Comment: (NOTE) The Coronavirus on the Respiratory Panel, DOES NOT test for the novel  Coronavirus (2019 nCoV)    Coronavirus HKU1 NOT DETECTED NOT DETECTED Final   Coronavirus NL63 NOT DETECTED NOT DETECTED Final   Coronavirus OC43 NOT DETECTED NOT DETECTED Final   Metapneumovirus NOT DETECTED NOT DETECTED Final   Rhinovirus / Enterovirus NOT DETECTED NOT DETECTED Final   Influenza A NOT DETECTED NOT DETECTED Final   Influenza B NOT DETECTED NOT  DETECTED Final   Parainfluenza Virus 1 NOT DETECTED NOT DETECTED Final   Parainfluenza Virus 2 NOT DETECTED NOT DETECTED Final   Parainfluenza Virus 3 NOT DETECTED NOT DETECTED Final   Parainfluenza Virus 4 NOT DETECTED NOT DETECTED Final   Respiratory Syncytial Virus NOT DETECTED NOT DETECTED Final   Bordetella pertussis NOT DETECTED NOT DETECTED Final   Bordetella Parapertussis NOT DETECTED NOT DETECTED Final   Chlamydophila pneumoniae NOT DETECTED NOT DETECTED Final   Mycoplasma pneumoniae NOT DETECTED NOT DETECTED Final    Comment: Performed at [facility], [place]. [address] [place], [place] [address]  Urine Culture     Status: None   Collection Time: 07/18/22  7:59 AM   Specimen: Urine, Clean Catch  Result Value Ref Range Status   Specimen Description   Final    URINE, CLEAN CATCH Performed at [facility], [address] [place], [place] [address]    Special Requests   Final    NONE Performed at [facility], [address] [place], [place] [address]    Culture   Final    NO GROWTH Performed at [facility], [place] [address] [place], [place] [address]    Report Status 07/19/2022 FINAL  Final  Culture, blood (Routine X 2) w Reflex to ID Panel     Status: None (Preliminary result)   Collection Time: 07/19/22  2:22 PM   Specimen: BLOOD  Result Value Ref Range Status   Specimen Description BLOOD LEFT ANTECUBITAL  Final   Special Requests   Final    BOTTLES DRAWN AEROBIC AND ANAEROBIC Blood Culture results may not be optimal due to an inadequate volume of blood received in culture bottles   Culture   Final    NO GROWTH 3 DAYS Performed at [facility], [address], [place], [place] [address]    Report Status PENDING  Incomplete  Culture, blood (Routine X 2) w Reflex to ID Panel  Status: None (Preliminary result)   Collection Time: 07/19/22  2:36 PM   Specimen: BLOOD  Result Value Ref Range Status   Specimen Description BLOOD  BLOOD RIGHT HAND  Final   Special Requests   Final    BOTTLES DRAWN AEROBIC AND ANAEROBIC Blood Culture adequate volume   Culture   Final    NO GROWTH 3 DAYS Performed at [facility], [address] [place], [place] [address]    Report Status PENDING  Incomplete  Expectorated Sputum Assessment w Gram Stain, Rflx to Resp Cult     Status: None   Collection Time: 07/19/22  7:25 PM   Specimen: Sputum  Result Value Ref Range Status   Specimen Description SPUTUM  Final   Special Requests NONE  Final   Sputum evaluation   Final    THIS SPECIMEN IS ACCEPTABLE FOR SPUTUM CULTURE Performed at [facility], [address], [place], [place] [address]    Report Status 07/19/2022 FINAL  Final  Culture, Respiratory w Gram Stain     Status: None (Preliminary result)   Collection Time: 07/19/22  7:25 PM   Specimen: SPU  Result Value Ref Range Status   Specimen Description   Final    SPUTUM Performed at [facility], [address] [place], [place] [address]    Special Requests   Final    NONE Reflexed from [id number] Performed at [facility], [address] [place], [place] [address]    Gram Stain   Final    FEW WBC PRESENT, PREDOMINANTLY MONONUCLEAR MODERATE BUDDING YEAST SEEN FEW GRAM VARIABLE ROD    Culture   Final    ABUNDANT YEAST CULTURE REINCUBATED FOR BETTER GROWTH Performed at [facility], [place] [address] [place], [place] [address]    Report Status PENDING  Incomplete         Radiology Studies: DG Ribs Unilateral Right  Result Date: 07/22/2022 CLINICAL DATA:  Right-sided rib pain. EXAM: RIGHT RIBS - 2 VIEW COMPARISON:  07/22/2022 FINDINGS: Right upper lobe airspace opacities superimposed upon diffuse interstitial disease is unchanged from previous chest radiograph. There are no signs of acute rib fracture. IMPRESSION: 1. No acute findings. 2. Unchanged right upper lobe airspace opacities compatible with pneumonia.  Electronically Signed   By: Kerby Moors M.D.   On: 07/22/2022 12:06   DG Chest Port 1 View  Result Date: 07/21/2022 CLINICAL DATA:  1 week of cough and shortness of breath, generalized weakness, history COPD, fibromyalgia, smoking, cocaine abuse EXAM: PORTABLE CHEST 1 VIEW COMPARISON:  Portable exam 1359 hours compared to 07/17/2022 FINDINGS: Normal heart size, mediastinal contours, and pulmonary vascularity. Atherosclerotic calcification aorta. Diffuse pulmonary infiltrates RIGHT lung favoring pneumonia, slightly improved. Remaining lungs clear. No pleural effusion or pneumothorax. Bones demineralized. IMPRESSION: Decreased LEFT lung infiltrates consistent with improving pneumonia. Aortic Atherosclerosis (ICD10-I70.0). Electronically Signed   By: Lavonia Dana M.D.   On: 07/21/2022 14:08        Scheduled Meds:  amoxicillin  1,000 mg Oral Q8H   enoxaparin (LOVENOX) injection  30 mg Subcutaneous Q24H   feeding supplement  237 mL Oral TID BM   guaiFENesin  600 mg Oral BID   insulin aspart  0-5 Units Subcutaneous QHS   insulin aspart  0-9 Units Subcutaneous TID WC   magic mouthwash  15 mL Oral QID   multivitamin with minerals  1 tablet Oral Daily   mupirocin ointment  1 Application Nasal BID   pantoprazole  40 mg Oral Daily   Continuous Infusions:     LOS: 4 days    Desma Maxim, MD [facility]   If 7PM-7AM, please contact night-coverage  07/22/2022, 2:25 PM

## 2022-07-22 NOTE — TOC Progression Note (Addendum)
Transition of Care [facility]) - Progression Note    Patient Details  Name: Vanessa Oneal MRN: [id number] Date of Birth: [birth year]  [facility]) CM/SW Contact  Kemper Durie, RN Phone Number: 07/22/2022, 2:03 PM  Clinical Narrative:     Spoke with patient regarding discharge plan.  Along with 3in1, rolling walker and Oxygen are recommended.  Patient state she already has rolling walker.  She will still need a cab to transport her home, but state she may not have enough money and will need a voucher.  Confirms she will have someone at the home to help get DME out of the car.  Will contact [employer] for 3in1 and oxygen once walking saturation test/score completed.  Update @ 1448:   MD requesting addition HH services.  Patient already has PT and OT set up with [facility], request to add SW to discharge orders.  Spoke with [name] at [facility], advised of the addition to the order, will also have RN and aide follow up.  Expected Discharge Plan: Home w Home Health Services Barriers to Discharge: Continued Medical Work up  Expected Discharge Plan and Services Expected Discharge Plan: Home w Home Health Services     Post Acute Care Choice: Durable Medical Equipment, Home Health Living arrangements for the past 2 months: Mobile Home                                       Social Determinants of Health (SDOH) Interventions    Readmission Risk Interventions     No data to display

## 2022-07-22 NOTE — Progress Notes (Signed)
Dr. Ashok Pall ordered xray of ribs

## 2022-07-23 LAB — GLUCOSE, CAPILLARY: Glucose-Capillary: 99 mg/dL (ref 70–99)

## 2022-07-23 MED ORDER — AMOXICILLIN 500 MG PO CAPS: 1000.0000 mg | ORAL_CAPSULE | Freq: Three times a day (TID) | ORAL | 0 refills | Status: AC

## 2022-07-23 MED ORDER — ALPRAZOLAM 0.25 MG PO TABS
0.2500 mg | ORAL_TABLET | Freq: Once | ORAL | Status: AC
  Administered 2022-07-23: 0.25 mg via ORAL
  Filled 2022-07-23: qty 1

## 2022-07-23 MED ORDER — TRELEGY ELLIPTA 100-62.5-25 MCG/ACT IN AEPB: 1.0000 | INHALATION_SPRAY | Freq: Every day | RESPIRATORY_TRACT | 1 refills | Status: DC

## 2022-07-23 MED ORDER — ALPRAZOLAM 0.25 MG PO TABS: 0.2500 mg | ORAL_TABLET | Freq: Three times a day (TID) | ORAL | 0 refills | Status: DC | PRN

## 2022-07-23 MED ORDER — ALBUTEROL SULFATE HFA 108 (90 BASE) MCG/ACT IN AERS
2.0000 | INHALATION_SPRAY | Freq: Four times a day (QID) | RESPIRATORY_TRACT | 3 refills | Status: DC | PRN
Start: 2022-07-23 — End: 2023-04-21

## 2022-07-23 MED ORDER — PREDNISONE 10 MG PO TABS: ORAL_TABLET | ORAL | 0 refills | Status: DC

## 2022-07-23 NOTE — Progress Notes (Signed)
OT Cancellation Note  Patient Details Name: DAVANEE KLINKNER MRN: [id number] DOB: 1951-07-13   Cancelled Treatment:    Reason Eval/Treat Not Completed: Other (comment) Pt visiting with family, politely declining to work with therapy at this time. Pt reporting "I am going home later today." OT to f/u as available.   Gerrie Nordmann 07/23/2022, 11:36 AM

## 2022-07-23 NOTE — TOC Transition Note (Addendum)
Transition of Care [facility]) - CM/SW Discharge Note   Patient Details  Name: Vanessa Oneal MRN: [id number] Date of Birth: [birth year]  Transition of Care [facility]) CM/SW Contact:  Luvenia Redden, RN Phone Number:[phone] 07/23/2022, 12:15 PM   Clinical Narrative:    Spoke with pt at bedside with son Vanessa Oneal. Pt anxious to be discharged today and declined the recommended DME. Pt has sufficient transportation to discharge home with no other needs presented.   Spoke with [name] ([facility]) for orders for PT/OT/SW alone with RN/Aide. Aware pt will be discharged today for services.  1:47 pm Addendum: Spoke with pt once again for arrangements for home O2. Pt receptive and will awaiting the delivery of the portable tanks. Also stable her son is at her home for the delivery of all other DME for the home O2 set-up.  Pt also indicated her girlfriend will be transporting her home today. Referral made to jasmine at [employer] for home O2 and portable.  No other request or needs at this time.   Final next level of care: Home w Home Health Services Barriers to Discharge: Barriers Resolved   Patient Goals and CMS Choice   CMS Medicare.gov Compare Post Acute Care list provided to:: Patient    Discharge Placement                  Name of family member notified: Vanessa Oneal (son at bedside) Patient and family notified of of transfer: 07/23/22  Discharge Plan and Services     Post Acute Care Choice: Durable Medical Equipment, Home Health                      [facility] Agency: [facility] (Already arranged just provided an update on pt's d/c today with [name])        Social Determinants of Health (SDOH) Interventions     Readmission Risk Interventions     No data to display

## 2022-07-23 NOTE — Progress Notes (Signed)
Physical Therapy Treatment Patient Details Name: Vanessa Oneal MRN: [id number] DOB: 04-22-1951 Today's Date: 07/23/2022   History of Present Illness 71 y.o. female with medical history significant of GERD,RA,Fibromyalgia,  tobacco abuse, H/o drug abuse (cocaine abuse), anxiety , depression, COPD who presents to ed with one week of cough /sob and generalized weakness.    PT Comments    Pt received in Semi-Fowler's position and agreeable to therapy.  Pt verbally upset with Dr. Ashok Pall upon arrival stating that he has no bedside manner, etc.  Initially not up to working with PT, but with encouragement and stating that her O2 saturations needed to be assessed, she was willing.  Family came in as pt was getting up.  Pt SpO2 was able to be read during treatment session accurately for the first time in a while.  Pt desaturated to 83% when ambulating, and evene with pursed lip breathing technique, she was only able to achieve 89% at rest sitting EOB.  Pt given 2L of O2 and was able to reach 94-96% within 60 sec.  Pt then taken off O2 and monitored and did not drop lower than 91% being on room air.  Nursing notified.  Current discharge plans to home with HHPT remain appropriate at this time.  Pt will continue to benefit from skilled therapy in order to address deficits listed below.     Recommendations for follow up therapy are one component of a multi-disciplinary discharge planning process, led by the attending physician.  Recommendations may be updated based on patient status, additional functional criteria and insurance authorization.  Follow Up Recommendations  Home health PT     Assistance Recommended at Discharge Intermittent Supervision/Assistance  Patient can return home with the following A little help with walking and/or transfers;A little help with bathing/dressing/bathroom;Assist for transportation;Help with stairs or ramp for entrance   Equipment Recommendations  Rolling walker (2  wheels)    Recommendations for Other Services       Precautions / Restrictions Precautions Precautions: Fall Restrictions Weight Bearing Restrictions: No     Mobility  Bed Mobility Overal bed mobility: Independent             General bed mobility comments: Pt able to get herself up to sitting w/o issue    Transfers Overall transfer level: Modified independent Equipment used: Rolling walker (2 wheels)               General transfer comment: Pt was able to rise to standing w/o assist, minimal UE reliance    Ambulation/Gait Ambulation/Gait assistance: Min guard Gait Distance (Feet): 160 Feet Assistive device: Rolling walker (2 wheels) Gait Pattern/deviations: Step-through pattern, Drifts right/left Gait velocity: decreased.     General Gait Details: Slow, and sometimes drifts, but overall steady.  Utilizes walker for reassurance more than support.   Stairs             Wheelchair Mobility    Modified Rankin (Stroke Patients Only)       Balance Overall balance assessment: Mild deficits observed, not formally tested                                          Cognition Arousal/Alertness: Awake/alert Behavior During Therapy: WFL for tasks assessed/performed Overall Cognitive Status: No family/caregiver present to determine baseline cognitive functioning  General Comments: Pt upset with Dr. Ashok Pall upon arrival and not wanting to participate initially, but agreeable with encouragement.        Exercises      General Comments        Pertinent Vitals/Pain Pain Assessment Pain Assessment: Faces Faces Pain Scale: Hurts a little bit Pain Location: ribs Pain Descriptors / Indicators: Aching, Sore Pain Intervention(s): Limited activity within patient's tolerance, Monitored during session    Home Living                          Prior Function            PT Goals  (current goals can now be found in the care plan section) Acute Rehab PT Goals Patient Stated Goal: Go home PT Goal Formulation: With patient Time For Goal Achievement: 07/31/22 Potential to Achieve Goals: Fair Progress towards PT goals: Progressing toward goals    Frequency    Min 2X/week      PT Plan Current plan remains appropriate    Co-evaluation              AM-PAC PT "6 Clicks" Mobility   Outcome Measure  Help needed turning from your back to your side while in a flat bed without using bedrails?: None Help needed moving from lying on your back to sitting on the side of a flat bed without using bedrails?: None Help needed moving to and from a bed to a chair (including a wheelchair)?: A Little Help needed standing up from a chair using your arms (e.g., wheelchair or bedside chair)?: None Help needed to walk in hospital room?: A Little Help needed climbing 3-5 steps with a railing? : A Little 6 Click Score: 21    End of Session Equipment Utilized During Treatment: Gait belt;Oxygen Activity Tolerance: Patient tolerated treatment well;Patient limited by fatigue Patient left: with call bell/phone within reach;in bed Nurse Communication: Mobility status PT Visit Diagnosis: Unsteadiness on feet (R26.81);Muscle weakness (generalized) (M62.81)     Time: 1610-9604 PT Time Calculation (min) (ACUTE ONLY): 13 min  Charges:  $Gait Training: 8-22 mins                     Nolon Bussing, PT, DPT 07/23/22, 3:12 PM

## 2022-07-23 NOTE — Progress Notes (Signed)
SATURATION QUALIFICATIONS: (This note is used to comply with regulatory documentation for home oxygen)  Patient Saturations on Room Air at Rest = 92%  Patient Saturations on Room Air while Ambulating = 84%

## 2022-07-23 NOTE — Discharge Summary (Signed)
UNNAMED HINO TIR:[id number] DOB: [birth year] DOA: 07/17/2022  PCP: Valerie Roys, DO  Admit date: 07/17/2022 Discharge date: 07/23/2022  Time spent: 40 minutes  Recommendations for Outpatient Follow-up:  Pcp and pulmonology f/u     Discharge Diagnoses:  Principal Problem:   CAP (community acquired pneumonia) Active Problems:   Anxiety disorder   Centrilobular emphysema ([place])   Malnutrition ([place])   Benzodiazepine abuse ([place])   Compression fracture of third lumbar vertebra ([place])   Protein-calorie malnutrition, severe   Bacteremia due to Streptococcus pneumoniae   Discharge Condition: stable  Diet recommendation: regular  Filed Weights   07/20/22 0500 07/21/22 0339 07/23/22 0400  Weight: 40.5 kg 35.9 kg 34.1 kg    History of present illness:  From admission h and p Vanessa Oneal is a 71 y.o. female with medical history significant of GERD,RA,Fibromyalgia,  tobacco abuse, H/o drug abuse (cocaine abuse), anxiety , depression, COPD who presents to ed with one week of cough /sob and generalized weakness. Patient BIB EMS , of note in the field patient was noted to have saturation of 80% on RA and poc blood glucose of 248. Per patient she has fall 5 days ago and after that note pain on right side with coughing. She notes sob and coughing has progressed since then.  She also endorse weight loss on on ROS, which she states as been ongoing for months. She also decrease feeling of early satiety. She notes notes no cardiac type chest discomfort., n/v/d/dysuria. In reference to polysubstance abuse she states she is in remission.  Hospital Course:   Community-acquired pneumonia Acute hypoxic respiratory failure Strep pneumo bacteremia Severe sepsis secondary to above Sepsis criteria met with leukocytosis, fever, tachypnea, lactic acidosis Initially on 6 L, weaned to room air at rest but desats to 84 with ambulation so ambulatory o2 ordered Treated with ceftriaxone, transitioned  to oral amoxicillin, to finish 7 day course Sensitivities have resulted, will discuss w/ pharm tomorrow transition to orals Repeat blood cultures ordered, ngtd [facility] PT/OT/RN/Aide/SW all ordered   Right anterior rib pain With associated tenderness. No trauma, skin normal. Likely 2/2 coughing. Dedicated rib films neg for fx   Acute exacerbation of COPD Marked emphysematous changes noted on imaging. Treated with 5 days steroids, will d/c with taper and will start trelegy given underlying likely severe copd, with ambulatory referral to pulm   Hyponatremia Resolved with hydration and food   Hyperglycemia A1c 5.1, nondiabetic No known history of diabetes 2/2 steroids, now much improved   Suspected severe malnutrition RD consult requested, supplements ordered   Rheumatoid arthritis Fibromyalgia Unclear if patient actually takes medications for this.  None listed on her home medication list   Tobacco use Counseled patient on cessation   Anxiety Low dose benzodiazepine given here with small amount given for home use   GERD PPI  Procedures: none   Consultations: none  Discharge Exam: Vitals:   07/23/22 0428 07/23/22 0834  BP: 109/77 116/68  Pulse: 80 94  Resp: 20 16  Temp: 97.9 F (36.6 C) 97.7 F (36.5 C)  SpO2: 98% 98%    General exam: No acute distress.  Appears frail and chronically ill Respiratory system: Diminished breath sounds bilaterally.  Scattered end expiratory wheeze.  right-sided crackles.  Mild dyspnea improving. Ttp right anterior lower chest wall Cardiovascular system: S1-S2, RRR, no murmurs, no pedal edema Gastrointestinal system: Thin/scaphoid, soft, NT/ND, normal bowel sounds 7 Central nervous system: Alert and oriented. No focal neurological deficits. Extremities: Good decreased  power bilaterally.  Muscle wasting noted bilaterally Skin: Thin and pale with no obvious rashes or lesions Psychiatry: Judgement and insight appear normal. Appears  depressed, .  Discharge Instructions   Discharge Instructions     Ambulatory referral to Pulmonology   Complete by: As directed    Reason for referral: Asthma/COPD   Diet - low sodium heart healthy   Complete by: As directed    Increase activity slowly   Complete by: As directed       Allergies as of 07/23/2022       Reactions   Gabapentin Anaphylaxis        Medication List     STOP taking these medications    diclofenac Sodium 1 % Gel Commonly known as: Voltaren       TAKE these medications    albuterol 108 (90 Base) MCG/ACT inhaler Commonly known as: VENTOLIN HFA Inhale 2 puffs into the lungs every 6 (six) hours as needed for wheezing or shortness of breath.   ALPRAZolam 0.25 MG tablet Commonly known as: XANAX Take 1 tablet (0.25 mg total) by mouth 3 (three) times daily as needed for anxiety.   amoxicillin 500 MG capsule Commonly known as: AMOXIL Take 2 capsules (1,000 mg total) by mouth every 8 (eight) hours for 2 days.   predniSONE 10 MG tablet Commonly known as: DELTASONE 3 tabs daily for two days then 2 tabs daily for two days then 1 tab daily for two days   promethazine 25 MG tablet Commonly known as: PHENERGAN TAKE 1/2 TABLET BY MOUTH EVERY 8 HOURS AS NEEDED FOR NAUSEA AND VOMITING   Trelegy Ellipta 100-62.5-25 MCG/ACT Aepb Generic drug: Fluticasone-Umeclidin-Vilant Inhale 1 Inhalation into the lungs daily in the afternoon.       Allergies  Allergen Reactions   Gabapentin Anaphylaxis    Follow-up Information     Park Liter P, DO Follow up.   Specialty: Family Medicine Contact information: [address] [place] [address] [phone]                  The results of significant diagnostics from this hospitalization (including imaging, microbiology, ancillary and laboratory) are listed below for reference.    Significant Diagnostic Studies: DG Ribs Unilateral Right  Result Date: 07/22/2022 CLINICAL DATA:  Right-sided  rib pain. EXAM: RIGHT RIBS - 2 VIEW COMPARISON:  07/22/2022 FINDINGS: Right upper lobe airspace opacities superimposed upon diffuse interstitial disease is unchanged from previous chest radiograph. There are no signs of acute rib fracture. IMPRESSION: 1. No acute findings. 2. Unchanged right upper lobe airspace opacities compatible with pneumonia. Electronically Signed   By: Kerby Moors M.D.   On: 07/22/2022 12:06   DG Chest Port 1 View  Result Date: 07/21/2022 CLINICAL DATA:  1 week of cough and shortness of breath, generalized weakness, history COPD, fibromyalgia, smoking, cocaine abuse EXAM: PORTABLE CHEST 1 VIEW COMPARISON:  Portable exam 1359 hours compared to 07/17/2022 FINDINGS: Normal heart size, mediastinal contours, and pulmonary vascularity. Atherosclerotic calcification aorta. Diffuse pulmonary infiltrates RIGHT lung favoring pneumonia, slightly improved. Remaining lungs clear. No pleural effusion or pneumothorax. Bones demineralized. IMPRESSION: Decreased LEFT lung infiltrates consistent with improving pneumonia. Aortic Atherosclerosis (ICD10-I70.0). Electronically Signed   By: Lavonia Dana M.D.   On: 07/21/2022 14:08   CT Angio Chest Pulmonary Embolism (PE) W or WO Contrast  Result Date: 07/18/2022 CLINICAL DATA:  Cough and shortness of breath. EXAM: CT ANGIOGRAPHY CHEST WITH CONTRAST TECHNIQUE: Multidetector CT imaging of the chest was  performed using the standard protocol during bolus administration of intravenous contrast. Multiplanar CT image reconstructions and MIPs were obtained to evaluate the vascular anatomy. RADIATION DOSE REDUCTION: This exam was performed according to the departmental dose-optimization program which includes automated exposure control, adjustment of the mA and/or kV according to patient size and/or use of iterative reconstruction technique. CONTRAST:  37m OMNIPAQUE IOHEXOL 350 MG/ML SOLN COMPARISON:  July 15, 2020 FINDINGS: Cardiovascular: There is mild  calcification of the aortic arch, without evidence of aortic aneurysm. Satisfactory opacification of the pulmonary arteries to the segmental level. No evidence of pulmonary embolism. Normal heart size. No pericardial effusion. Mediastinum/Nodes: A 2.2 cm x 1.4 cm precarinal lymph node is seen. Mild AP window, pretracheal, subcarinal and right hilar lymphadenopathy is also noted. Thyroid gland, trachea, and esophagus demonstrate no significant findings. Lungs/Pleura: There is marked severity emphysematous lung disease. Extensive infiltrates and large areas of subsequent consolidation are seen within the right upper lobe, right middle lobe and right lower lobe. There is no evidence of a pleural effusion or pneumothorax. Upper Abdomen: No acute abnormality. Musculoskeletal: No chest wall abnormality. No acute or significant osseous findings. Review of the MIP images confirms the above findings. IMPRESSION: 1. No evidence of pulmonary embolism. 2. Extensive right upper lobe, right middle lobe and right lower lobe infiltrates. Follow-up to resolution is recommended, as an underlying neoplastic process cannot be excluded. 3. Marked severity emphysematous lung disease. Aortic Atherosclerosis (ICD10-I70.0) and Emphysema (ICD10-J43.9). Electronically Signed   By: TVirgina NorfolkM.D.   On: 07/18/2022 02:01   DG Chest Port 1 View  Result Date: 07/17/2022 CLINICAL DATA:  Possible sepsis with hypoxia and recent fall EXAM: PORTABLE CHEST 1 VIEW COMPARISON:  07/15/2020 FINDINGS: Cardiac shadow is within normal limits. Lungs are hyperinflated consistent with COPD. Aortic calcifications are noted. Diffuse airspace opacity is noted throughout the right lung consistent with acute pneumonia. No new focal abnormality is noted. IMPRESSION: Diffuse right-sided infiltrate consistent with pneumonia. Electronically Signed   By: MInez CatalinaM.D.   On: 07/17/2022 22:47    Microbiology: Recent Results (from the past 240 hour(s))   Blood Culture (routine x 2)     Status: Abnormal   Collection Time: 07/17/22 10:06 PM   Specimen: BLOOD  Result Value Ref Range Status   Specimen Description   Final    BLOOD LEFT HAND [facility]formed at [facility] [address], [place] [address]   Special Requests   Final    BOTTLES DRAWN AEROBIC AND ANAEROBIC Blood Culture results may not be optimal due to an inadequate volume of blood received in culture bottles [facility]formed at [facility] [address], [place] [id number]   Culture  Setup Time   Final    GRAM POSITIVE COCCI IN BOTH AEROBIC AND ANAEROBIC BOTTLES CRITICAL VALUE NOTED.  VALUE IS CONSISTENT WITH PREVIOUSLY REPORTED AND CALLED VALUE.    Culture (A)  Final    STREPTOCOCCUS PNEUMONIAE SUSCEPTIBILITIES PERFORMED ON PREVIOUS CULTURE WITHIN THE LAST 5 DAYS. Performed at [facility] [address] [place] [address]   Report Status 07/20/2022 FINAL  Final  Blood Culture (routine x 2)     Status: Abnormal   Collection Time: 07/17/22 10:06 PM   Specimen: BLOOD  Result Value Ref Range Status   Specimen Description   Final    BLOOD RIGHT FOREARM Performed at [facility] [address], [place]  [address]   Special  Requests   Final    BOTTLES DRAWN AEROBIC AND ANAEROBIC Blood Culture results may not be optimal due to an inadequate volume of blood received in culture bottles Performed at [facility], [address] [place], [place] [address]    Culture  Setup Time   Final    AEROBIC BOTTLE ONLY GRAM POSITIVE COCCI CRITICAL RESULT CALLED TO, READ BACK BY AND VERIFIED WITH: CAROLINE CHILDEF AT 1649 ON 07/18/22 BY SS    Culture STREPTOCOCCUS PNEUMONIAE (A)  Final   Report Status 07/20/2022 FINAL  Final   Organism ID, Bacteria STREPTOCOCCUS PNEUMONIAE  Final      Susceptibility   Streptococcus pneumoniae - MIC*    ERYTHROMYCIN >=8 RESISTANT Resistant     LEVOFLOXACIN 1 SENSITIVE  Sensitive     VANCOMYCIN 0.5 SENSITIVE Sensitive     PENO - penicillin 0.12      PENICILLIN (non-meningitis) 0.12 SENSITIVE Sensitive     PENICILLIN (oral) 0.12 INTERMEDIATE Intermediate     CEFTRIAXONE (non-meningitis) 0.25 SENSITIVE Sensitive     * STREPTOCOCCUS PNEUMONIAE  Blood Culture ID Panel (Reflexed)     Status: Abnormal   Collection Time: 07/17/22 10:06 PM  Result Value Ref Range Status   Enterococcus faecalis NOT DETECTED NOT DETECTED Final   Enterococcus Faecium NOT DETECTED NOT DETECTED Final   Listeria monocytogenes NOT DETECTED NOT DETECTED Final   Staphylococcus species NOT DETECTED NOT DETECTED Final   Staphylococcus aureus (BCID) NOT DETECTED NOT DETECTED Final   Staphylococcus epidermidis NOT DETECTED NOT DETECTED Final   Staphylococcus lugdunensis NOT DETECTED NOT DETECTED Final   Streptococcus species DETECTED (A) NOT DETECTED Final    Comment: CRITICAL RESULT CALLED TO, READ BACK BY AND VERIFIED WITH: CAROLINE CHILDEF AT 1649 ON 07/18/22 BY SS    Streptococcus agalactiae NOT DETECTED NOT DETECTED Final   Streptococcus pneumoniae DETECTED (A) NOT DETECTED Final    Comment: CRITICAL RESULT CALLED TO, READ BACK BY AND VERIFIED WITH: CAROLINE CHILDEF AT 1649 ON 07/18/22 BY SS    Streptococcus pyogenes NOT DETECTED NOT DETECTED Final   A.calcoaceticus-baumannii NOT DETECTED NOT DETECTED Final   Bacteroides fragilis NOT DETECTED NOT DETECTED Final   Enterobacterales NOT DETECTED NOT DETECTED Final   Enterobacter cloacae complex NOT DETECTED NOT DETECTED Final   Escherichia coli NOT DETECTED NOT DETECTED Final   Klebsiella aerogenes NOT DETECTED NOT DETECTED Final   Klebsiella oxytoca NOT DETECTED NOT DETECTED Final   Klebsiella pneumoniae NOT DETECTED NOT DETECTED Final   Proteus species NOT DETECTED NOT DETECTED Final   Salmonella species NOT DETECTED NOT DETECTED Final   Serratia marcescens NOT DETECTED NOT DETECTED Final   Haemophilus influenzae NOT DETECTED NOT  DETECTED Final   Neisseria meningitidis NOT DETECTED NOT DETECTED Final   Pseudomonas aeruginosa NOT DETECTED NOT DETECTED Final   Stenotrophomonas maltophilia NOT DETECTED NOT DETECTED Final   Candida albicans NOT DETECTED NOT DETECTED Final   Candida auris NOT DETECTED NOT DETECTED Final   Candida glabrata NOT DETECTED NOT DETECTED Final   Candida krusei NOT DETECTED NOT DETECTED Final   Candida parapsilosis NOT DETECTED NOT DETECTED Final   Candida tropicalis NOT DETECTED NOT DETECTED Final   Cryptococcus neoformans/gattii NOT DETECTED NOT DETECTED Final    Comment: Performed at [facility], [address] [place], [place] [address]  Resp Panel by RT-PCR (Flu A&B, Covid) Anterior Nasal Swab     Status: None   Collection Time: 07/17/22 11:05 PM   Specimen: Anterior Nasal Swab  Result Value Ref Range Status   SARS Coronavirus 2 by RT PCR NEGATIVE NEGATIVE Final    Comment: (NOTE) SARS-CoV-2 target nucleic acids are NOT DETECTED.  The SARS-CoV-2 RNA is generally detectable in upper respiratory specimens during the acute phase of infection. The lowest concentration of SARS-CoV-2 viral copies this assay can detect is 138 copies/mL. A negative result does not preclude SARS-Cov-2 infection and should not be used as the sole basis for treatment or other patient management decisions. A negative result may occur with  improper specimen collection/handling, submission of specimen other than nasopharyngeal swab, presence of viral mutation(s) within the areas targeted by this assay, and inadequate number of viral copies(<138 copies/mL). A negative result must be combined with clinical observations, patient history, and epidemiological information. The expected result is Negative.  Fact Sheet for Patients:  EntrepreneurPulse.com.au  Fact Sheet for Healthcare Providers:  IncredibleEmployment.be  This test is no t yet approved or cleared by the  [place] FDA and  has been authorized for detection and/or diagnosis of SARS-CoV-2 by FDA under an Emergency Use Authorization (EUA). This EUA will remain  in effect (meaning this test can be used) for the duration of the COVID-19 declaration under Section 564(b)(1) of the Act, 21 U.S.C.section 360bbb-3(b)(1), unless the authorization is terminated  or revoked sooner.       Influenza A by PCR NEGATIVE NEGATIVE Final   Influenza B by PCR NEGATIVE NEGATIVE Final    Comment: (NOTE) The Xpert Xpress SARS-CoV-2/FLU/RSV plus assay is intended as an aid in the diagnosis of influenza from Nasopharyngeal swab specimens and should not be used as a sole basis for treatment. Nasal washings and aspirates are unacceptable for Xpert Xpress SARS-CoV-2/FLU/RSV testing.  Fact Sheet for Patients: EntrepreneurPulse.com.au  Fact Sheet for Healthcare Providers: IncredibleEmployment.be  This test is not yet approved or cleared by the [place] FDA and has been authorized for detection and/or diagnosis of SARS-CoV-2 by FDA under an Emergency Use Authorization (EUA). This EUA will remain in effect (meaning this test can be used) for the duration of the COVID-19 declaration under Section 564(b)(1) of the Act, 21 U.S.C. section 360bbb-3(b)(1), unless the authorization is terminated or revoked.  Performed at [facility], [address] [place], [place] [address]   MRSA Next Gen by PCR, Nasal     Status: Abnormal   Collection Time: 07/18/22  1:12 AM   Specimen: Nasal Mucosa; Nasal Swab  Result Value Ref Range Status   MRSA by PCR Next Gen DETECTED (A) NOT DETECTED Final    Comment: RESULT CALLED TO, READ BACK BY AND VERIFIED WITH: VENESSA ASHLEY 07/18/22 0620 MW (NOTE) The GeneXpert MRSA Assay (FDA approved for NASAL specimens only), is one component of a comprehensive MRSA colonization surveillance program. It is not intended to diagnose MRSA  infection nor to guide or monitor treatment for MRSA infections. Test performance is not FDA approved in patients less than 61 years old. Performed at [facility], [place], [place] [address]   Respiratory (~20 pathogens) panel by PCR     Status: None   Collection Time: 07/18/22  4:29 AM   Specimen: Nasopharyngeal Swab; Respiratory  Result Value Ref Range Status   Adenovirus NOT DETECTED NOT DETECTED Final   Coronavirus 229E NOT DETECTED NOT DETECTED Final    Comment: (NOTE) The Coronavirus on the Respiratory Panel, DOES NOT test for the novel  Coronavirus (2019 nCoV)    Coronavirus HKU1 NOT DETECTED NOT DETECTED Final  Coronavirus NL63 NOT DETECTED NOT DETECTED Final   Coronavirus OC43 NOT DETECTED NOT DETECTED Final   Metapneumovirus NOT DETECTED NOT DETECTED Final   Rhinovirus / Enterovirus NOT DETECTED NOT DETECTED Final   Influenza A NOT DETECTED NOT DETECTED Final   Influenza B NOT DETECTED NOT DETECTED Final   Parainfluenza Virus 1 NOT DETECTED NOT DETECTED Final   Parainfluenza Virus 2 NOT DETECTED NOT DETECTED Final   Parainfluenza Virus 3 NOT DETECTED NOT DETECTED Final   Parainfluenza Virus 4 NOT DETECTED NOT DETECTED Final   Respiratory Syncytial Virus NOT DETECTED NOT DETECTED Final   Bordetella pertussis NOT DETECTED NOT DETECTED Final   Bordetella Parapertussis NOT DETECTED NOT DETECTED Final   Chlamydophila pneumoniae NOT DETECTED NOT DETECTED Final   Mycoplasma pneumoniae NOT DETECTED NOT DETECTED Final    Comment: Performed at [facility], [place] [address] [place], [place] [address]  Urine Culture     Status: None   Collection Time: 07/18/22  7:59 AM   Specimen: Urine, Clean Catch  Result Value Ref Range Status   Specimen Description   Final    URINE, CLEAN CATCH Performed at [facility], [address], [place], [place] [address]    Special Requests   Final    NONE Performed at [facility], [address] [place], [place] [address]    Culture   Final    NO GROWTH Performed at [facility], [place] [address], [place], [place] [address]    Report Status 07/19/2022 FINAL  Final  Culture, blood (Routine X 2) w Reflex to ID Panel     Status: None (Preliminary result)   Collection Time: 07/19/22  2:22 PM   Specimen: BLOOD  Result Value Ref Range Status   Specimen Description BLOOD LEFT ANTECUBITAL  Final   Special Requests   Final    BOTTLES DRAWN AEROBIC AND ANAEROBIC Blood Culture results may not be optimal due to an inadequate volume of blood received in culture bottles   Culture   Final    NO GROWTH 4 DAYS Performed at [facility], [address], [place], [place] [address]    Report Status PENDING  Incomplete  Culture, blood (Routine X 2) w Reflex to ID Panel     Status: None (Preliminary result)   Collection Time: 07/19/22  2:36 PM   Specimen: BLOOD  Result Value Ref Range Status   Specimen Description BLOOD BLOOD RIGHT HAND  Final   Special Requests   Final    BOTTLES DRAWN AEROBIC AND ANAEROBIC Blood Culture adequate volume   Culture   Final    NO GROWTH 4 DAYS Performed at [facility], [address] [place], [place] [address]    Report Status PENDING  Incomplete  Expectorated Sputum Assessment w Gram Stain, Rflx to Resp Cult     Status: None   Collection Time: 07/19/22  7:25 PM   Specimen: Sputum  Result Value Ref Range Status   Specimen Description SPUTUM  Final   Special Requests NONE  Final   Sputum evaluation   Final    THIS SPECIMEN IS ACCEPTABLE FOR SPUTUM CULTURE Performed at [facility], [address] [place],  [address]    Report Status 07/19/2022 FINAL  Final  Culture, Respiratory w Gram Stain     Status: None (Preliminary result)   Collection Time: 07/19/22  7:25 PM   Specimen: SPU  Result Value Ref Range Status  Specimen Description   Final    SPUTUM Performed at [facility], [address] [place], [place] [address]    Special Requests   Final    NONE Reflexed from [id number] Performed at [facility], [place], [place] [address]    Gram Stain   Final    FEW WBC PRESENT, PREDOMINANTLY MONONUCLEAR MODERATE BUDDING YEAST SEEN FEW GRAM VARIABLE ROD    Culture   Final    ABUNDANT YEAST IDENTIFICATION TO FOLLOW Performed at [facility], [place] [address], [place], [place] [address]    Report Status PENDING  Incomplete     Labs: Basic Metabolic Panel: Recent Labs  Lab 07/17/22 2206 07/18/22 0112 07/18/22 0429 07/19/22 1422 07/20/22 0449 07/21/22 0425  NA 126*  --  131* 132* 134* 137  K 3.0*  --  2.9* 4.5 4.4 4.0  CL 91*  --  97* 104 105 100  CO2 26  --  25 20* 24 29  GLUCOSE 168*  --  145* 312* 127* 91  BUN 25*  --  18 26* 29* 22  CREATININE 0.71 0.61 0.58 0.61 0.53 0.53  CALCIUM 7.6*  --  7.7* 8.3* 8.1* 8.3*  MG  --   --   --  1.9  --   --    Liver Function Tests: Recent Labs  Lab 07/17/22 2206 07/18/22 0429  AST 26 21  ALT 14 14  ALKPHOS 81 77  BILITOT 0.6 1.0  PROT 5.3* 5.1*  ALBUMIN 2.0* 1.8*   No results for input(s): "LIPASE", "AMYLASE" in the last 168 hours. No results for input(s): "AMMONIA" in the last 168 hours. CBC: Recent Labs  Lab 07/17/22 2206 07/18/22 0112 07/18/22 0429  WBC 18.5* 21.3* 18.5*  NEUTROABS 16.7*  --   --   HGB 10.3* 10.5* 10.2*  HCT 29.5* 30.3* 29.5*  MCV 95.8 96.5 96.7  PLT 180 192 190   Cardiac Enzymes: No results for input(s): "CKTOTAL", "CKMB", "CKMBINDEX", "TROPONINI" in the last 168 hours. BNP: BNP (last 3 results) Recent Labs    07/17/22 2206  BNP 263.0*    ProBNP (last 3 results) No results for input(s): "PROBNP" in the last 8760 hours.  CBG: Recent Labs  Lab 07/21/22 2110 07/22/22 0745 07/22/22 1131 07/22/22 1536 07/23/22 0416  GLUCAP 128* 116* 205* 208* 99       Signed:  Desma Maxim MD.  [facility] 07/23/2022, 1:34 PM

## 2022-07-24 ENCOUNTER — Other Ambulatory Visit: Payer: Self-pay | Admitting: *Deleted

## 2022-07-24 ENCOUNTER — Telehealth: Payer: Self-pay | Admitting: *Deleted

## 2022-07-24 LAB — CULTURE, BLOOD (ROUTINE X 2)
Culture: NO GROWTH
Culture: NO GROWTH
Special Requests: ADEQUATE

## 2022-07-24 LAB — CULTURE, RESPIRATORY W GRAM STAIN

## 2022-07-24 NOTE — Telephone Encounter (Signed)
Transition Care Management Unsuccessful Follow-up Telephone Call  Date of discharge and from where:  [facility] 8-20  Attempts:  1st Attempt  Reason for unsuccessful TCM follow-up call:  Unable to leave message  full

## 2022-07-24 NOTE — Patient Outreach (Signed)
  Care Coordination [facility] Note Transition Care Management Unsuccessful Follow-up Telephone Call  Date of discharge and from where:  [facility] [phone]  Attempts:  1st Attempt  Reason for unsuccessful TCM follow-up call:  No answer/busy mailbox full unable to leave a message  Gean Maidens BSN RN [facility] [phone]

## 2022-07-24 NOTE — Patient Outreach (Signed)
  Care Coordination [facility] Note Transition Care Management Follow-up Telephone Call Date of discharge and from where: [phone] [facility] How have you been since you were released from the hospital? better Any questions or concerns? Yes  Items Reviewed: Did the pt receive and understand the discharge instructions provided? Yes  Medications obtained and verified? No  Other? Yes  Daughter is making arrangements for patient to get meds Any new allergies since your discharge? No  Dietary orders reviewed? No Do you have support at home? Yes   Home Care and Equipment/Supplies: Were home health services ordered? no If so, what is the name of the agency? N/a  Has the agency set up a time to come to the patient's home? not applicable Were any new equipment or medical supplies ordered?  No What is the name of the medical supply agency? N  Were you able to get the supplies/equipment? not applicable Do you have any questions related to the use of the equipment or supplies? No  Functional Questionnaire: (I = Independent and D = Dependent) ADLs: I  Bathing/Dressing- I  Meal Prep- D  Eating- I  Maintaining continence- I  Transferring/Ambulation- I  Managing Meds- I  Follow up appointments reviewed:  PCP Hospital f/u appt confirmed? No   Specialist Hospital f/u appt confirmed? No   Are transportation arrangements needed? N If their condition worsens, is the pt aware to call PCP or go to the Emergency Dept.? Yes Was the patient provided with contact information for the PCP's office or ED? Yes Was to pt encouraged to call back with questions or concerns? Yes  SDOH assessments and interventions completed:   Yes  Care Coordination Interventions Activated:  Yes   Care Coordination Interventions:  PCP follow up appointment requested , Pharmacy and Social worker   Encounter Outcome:  Pt. Visit Completed    Gean Maidens BSN RN [facility] Care Management [phone]

## 2022-07-25 ENCOUNTER — Telehealth: Payer: Self-pay | Admitting: *Deleted

## 2022-07-25 ENCOUNTER — Telehealth: Payer: Self-pay

## 2022-07-25 DIAGNOSIS — J189 Pneumonia, unspecified organism: Secondary | ICD-10-CM

## 2022-07-25 NOTE — Telephone Encounter (Signed)
   Telephone encounter was:  Successful.  07/25/2022 Name: [name] MRN: [id number] DOB: [birth year]  Vanessa Oneal is a 71 y.o. year old female who is a primary care patient of Dorcas Carrow, DO . The community resource team was consulted for assistance with  moms meals  Care guide performed the following interventions: Patient provided with information about care guide support team and interviewed to confirm resource needs.Patient was dc from [place] on sunday and needs moms meals. [employer] will be calling the patient back to set up delivery  Follow Up Plan:  No further follow up planned at this time. The patient has been provided with needed resources.    Lenard Forth Care Guide, Embedded Care Coordination [facility], Care Management  [phone] [address] [place], [place], [place] [address] Phone: [phone] Email: [email or web address]
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
## 2022-07-26 ENCOUNTER — Institutional Professional Consult (permissible substitution): Payer: Medicaid Other | Admitting: Student in an Organized Health Care Education/Training Program

## 2022-07-27 ENCOUNTER — Ambulatory Visit: Payer: Self-pay

## 2022-07-27 NOTE — Telephone Encounter (Signed)
     Chief Complaint: Recently discharged from hospital. 2 days ago both feet and ankles started swelling, painful as well.Asking for VV due to transportation issues. No availability until tomorrow afternoon.  Symptoms: Swelling, pain Frequency: 2 days ago Pertinent Negatives: Patient denies any other symptoms Disposition: [] ED /[] Urgent Care (no appt availability in office) / [] Appointment(In office/virtual)/ []  [place] [facility]/ [] Home Care/ [] Refused Recommended Disposition /[] [place] [facility]/ [x]  Follow-up with PCP Additional Notes: Spoke with and will send triage for review.  Answer Assessment - Initial Assessment Questions 1. ONSET: "When did the swelling start?" (e.g., minutes, hours, days)     Yesterday 2. LOCATION: "What part of the leg is swollen?"  "Are both legs swollen or just one leg?"     Both feet and ankles 3. SEVERITY: "How bad is the swelling?" (e.g., localized; mild, moderate, severe)   - Localized: Small area of swelling localized to one leg.   - MILD pedal edema: Swelling limited to foot and ankle, pitting edema < 1/4 inch (6 mm) deep, rest and elevation eliminate most or all swelling.   - MODERATE edema: Swelling of lower leg to knee, pitting edema > 1/4 inch (6 mm) deep, rest and elevation only partially reduce swelling.   - SEVERE edema: Swelling extends above knee, facial or hand swelling present.      Mild 4. REDNESS: "Does the swelling look red or infected?"     No 5. PAIN: "Is the swelling painful to touch?" If Yes, ask: "How painful is it?"   (Scale 1-10; mild, moderate or severe)     10 6. FEVER: "Do you have a fever?" If Yes, ask: "What is it, how was it measured, and when did it start?"      No 7. CAUSE: "What do you think is causing the leg swelling?"     Unsure 8. MEDICAL HISTORY: "Do you have a history of blood clots (e.g., DVT), cancer, heart failure, kidney disease, or liver failure?"     No 9. RECURRENT SYMPTOM: "Have you had  leg swelling before?" If Yes, ask: "When was the last time?" "What happened that time?"     Yes 10. OTHER SYMPTOMS: "Do you have any other symptoms?" (e.g., chest pain, difficulty breathing)       Pain 11. PREGNANCY: "Is there any chance you are pregnant?" "When was your last menstrual period?"       No  Protocols used: Leg Swelling and Edema-A-AH

## 2022-07-27 NOTE — Telephone Encounter (Signed)
Please advise 

## 2022-07-28 ENCOUNTER — Ambulatory Visit: Payer: Self-pay

## 2022-07-28 ENCOUNTER — Other Ambulatory Visit: Payer: Self-pay | Admitting: Family Medicine

## 2022-07-28 ENCOUNTER — Other Ambulatory Visit: Payer: Self-pay

## 2022-07-28 DIAGNOSIS — E46 Unspecified protein-calorie malnutrition: Secondary | ICD-10-CM | POA: Diagnosis not present

## 2022-07-28 DIAGNOSIS — F17210 Nicotine dependence, cigarettes, uncomplicated: Secondary | ICD-10-CM | POA: Diagnosis not present

## 2022-07-28 DIAGNOSIS — S32010D Wedge compression fracture of first lumbar vertebra, subsequent encounter for fracture with routine healing: Secondary | ICD-10-CM | POA: Diagnosis not present

## 2022-07-28 DIAGNOSIS — R7881 Bacteremia: Secondary | ICD-10-CM | POA: Diagnosis not present

## 2022-07-28 DIAGNOSIS — Z9181 History of falling: Secondary | ICD-10-CM | POA: Diagnosis not present

## 2022-07-28 DIAGNOSIS — J432 Centrilobular emphysema: Secondary | ICD-10-CM | POA: Diagnosis not present

## 2022-07-28 MED ORDER — ALPRAZOLAM 0.25 MG PO TABS
0.2500 mg | ORAL_TABLET | Freq: Three times a day (TID) | ORAL | 0 refills | Status: DC | PRN
Start: 2022-07-28 — End: 2022-07-31

## 2022-07-28 NOTE — Telephone Encounter (Signed)
  Chief Complaint: out of needed medication Symptoms: anxiety Frequency: now Pertinent Negatives: Patient denies  Disposition: [] ED /[] Urgent Care (no appt availability in office) / [] Appointment(In office/virtual)/ []  [place] [facility]/ [] Home Care/ [] Refused Recommended Disposition /[] [place] Mobile [facility]/ [x]  Follow-up with PCP Additional Notes: Home health nurse called. Pt is out of Xanax and has anxiety. Home health nurse would like this medication refilled  - at least enough for the weekend.   Reason for Disposition  [1] Prescription refill request for ESSENTIAL medicine (i.e., likelihood of harm to patient if not taken) AND [2] triager unable to refill per department policy  Answer Assessment - Initial Assessment Questions 1. DRUG NAME: "What medicine do you need to have refilled?"     Xanax 2. REFILLS REMAINING: "How many refills are remaining?" (Note: The label on the medicine or pill bottle will show how many refills are remaining. If there are no refills remaining, then a renewal may be needed.)     0 3. EXPIRATION DATE: "What is the expiration date?" (Note: The label states when the prescription will expire, and thus can no longer be refilled.)      4. PRESCRIBING HCP: "Who prescribed it?" Reason: If prescribed by specialist, call should be referred to that group.      5. SYMPTOMS: "Do you have any symptoms?"     Anxiety 6. PREGNANCY: "Is there any chance that you are pregnant?" "When was your last menstrual period?"     na  Protocols used: Medication Refill and Renewal Call-A-AH

## 2022-07-28 NOTE — Telephone Encounter (Signed)
Home health nurse aware medication is at the pharmacy and aware of provider advise.

## 2022-07-28 NOTE — Addendum Note (Signed)
Addended by: Pablo Ledger on: 07/28/2022 04:01 PM   Modules accepted: Orders

## 2022-07-28 NOTE — Telephone Encounter (Signed)
Patient is scheduled for Monday at 10:40
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
## 2022-07-28 NOTE — Telephone Encounter (Signed)
I really should see patient.. can she come in on Monday?

## 2022-07-28 NOTE — Telephone Encounter (Signed)
Rx sent until her appointment. This does NOT mean we will continue this medicine long term.

## 2022-07-28 NOTE — Telephone Encounter (Signed)
Call from pt's home health nurse.  Nurse states that pt has not Xanax left and is very anxious.    Nurse would like Xanax refilled at least enough for this weekend.

## 2022-07-31 ENCOUNTER — Encounter: Payer: Self-pay | Admitting: Family Medicine

## 2022-07-31 ENCOUNTER — Ambulatory Visit (INDEPENDENT_AMBULATORY_CARE_PROVIDER_SITE_OTHER): Payer: Medicare Other | Admitting: Family Medicine

## 2022-07-31 ENCOUNTER — Ambulatory Visit: Payer: Self-pay | Admitting: *Deleted

## 2022-07-31 ENCOUNTER — Telehealth: Payer: Self-pay

## 2022-07-31 ENCOUNTER — Ambulatory Visit: Payer: Self-pay

## 2022-07-31 VITALS — BP 99/62 | HR 92 | Temp 98.1°F | Wt 79.8 lb

## 2022-07-31 DIAGNOSIS — G629 Polyneuropathy, unspecified: Secondary | ICD-10-CM

## 2022-07-31 DIAGNOSIS — F419 Anxiety disorder, unspecified: Secondary | ICD-10-CM | POA: Diagnosis not present

## 2022-07-31 DIAGNOSIS — I700 Atherosclerosis of aorta: Secondary | ICD-10-CM

## 2022-07-31 DIAGNOSIS — E559 Vitamin D deficiency, unspecified: Secondary | ICD-10-CM | POA: Diagnosis not present

## 2022-07-31 DIAGNOSIS — J189 Pneumonia, unspecified organism: Secondary | ICD-10-CM | POA: Diagnosis not present

## 2022-07-31 DIAGNOSIS — F1310 Sedative, hypnotic or anxiolytic abuse, uncomplicated: Secondary | ICD-10-CM | POA: Diagnosis not present

## 2022-07-31 MED ORDER — MIRTAZAPINE 15 MG PO TBDP: 15.0000 mg | ORAL_TABLET | Freq: Every day | ORAL | 3 refills | Status: DC

## 2022-07-31 MED ORDER — BACLOFEN 5 MG PO TABS: 5.0000 mg | ORAL_TABLET | Freq: Every evening | ORAL | 2 refills | Status: DC | PRN

## 2022-07-31 MED ORDER — TRELEGY ELLIPTA 100-62.5-25 MCG/ACT IN AEPB: 1.0000 | INHALATION_SPRAY | Freq: Every day | RESPIRATORY_TRACT | 3 refills | Status: DC

## 2022-07-31 MED ORDER — MAGIC MOUTHWASH: 5.0000 mL | Freq: Three times a day (TID) | ORAL | 1 refills | Status: DC

## 2022-07-31 MED ORDER — PROMETHAZINE HCL 25 MG PO TABS: ORAL_TABLET | ORAL | 1 refills | Status: DC
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
## 2022-07-31 NOTE — Telephone Encounter (Signed)
Pharmacist is aware.  

## 2022-07-31 NOTE — Telephone Encounter (Signed)
Copied from [place]. Topic: General - Other >> Jul 31, 2022  3:56 PM Everette C wrote: Reason for CRM: Synclair with the patient's pharmacy has called regarding the patient's recent prescription for magic mouthwash SOLN [[id number]]   The patient's pharmacy would like to know if the patient needs the kit or just the compounded mouthwash   The kit is not currently available at their location   Please contact further when possible

## 2022-07-31 NOTE — Telephone Encounter (Signed)
Called pharmacy x 2. Long hold time for both attempts. Will try again.  Summary: Pharmacy Rx clarification   Gaye Pollack called from the pharmacy needing clarification, please advise. Has questions about the patient's magic mouth was soln.
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
## 2022-07-31 NOTE — Addendum Note (Signed)
Addended by: Wenda Overland on: 07/31/2022 05:23 PM   Modules accepted: Orders

## 2022-07-31 NOTE — Telephone Encounter (Signed)
Called pharmacy, hold time was > 5 mins.

## 2022-07-31 NOTE — Telephone Encounter (Signed)
Regular compound is fine

## 2022-08-01 ENCOUNTER — Ambulatory Visit: Payer: Self-pay | Admitting: *Deleted

## 2022-08-01 ENCOUNTER — Telehealth: Payer: Self-pay

## 2022-08-01 LAB — COMPREHENSIVE METABOLIC PANEL
ALT: 13 IU/L (ref 0–32)
AST: 17 IU/L (ref 0–40)
Albumin/Globulin Ratio: 0.9 — ABNORMAL LOW (ref 1.2–2.2)
Albumin: 2.9 g/dL — ABNORMAL LOW (ref 3.9–4.9)
Alkaline Phosphatase: 99 IU/L (ref 44–121)
BUN/Creatinine Ratio: 28 (ref 12–28)
BUN: 15 mg/dL (ref 8–27)
Bilirubin Total: 0.2 mg/dL (ref 0.0–1.2)
CO2: 28 mmol/L (ref 20–29)
Calcium: 8.9 mg/dL (ref 8.7–10.3)
Chloride: 97 mmol/L (ref 96–106)
Creatinine, Ser: 0.54 mg/dL — ABNORMAL LOW (ref 0.57–1.00)
Globulin, Total: 3.2 g/dL (ref 1.5–4.5)
Glucose: 103 mg/dL — ABNORMAL HIGH (ref 70–99)
Potassium: 5 mmol/L (ref 3.5–5.2)
Sodium: 137 mmol/L (ref 134–144)
Total Protein: 6.1 g/dL (ref 6.0–8.5)
eGFR: 99 mL/min/{1.73_m2} (ref >59)

## 2022-08-01 LAB — CBC WITH DIFFERENTIAL/PLATELET
Basophils Absolute: 0 10*3/uL (ref 0.0–0.2)
Basos: 0 %
EOS (ABSOLUTE): 0.1 10*3/uL (ref 0.0–0.4)
Eos: 1 %
Hematocrit: 29.3 % — ABNORMAL LOW (ref 34.0–46.6)
Hemoglobin: 9.9 g/dL — ABNORMAL LOW (ref 11.1–15.9)
Immature Grans (Abs): 0 10*3/uL (ref 0.0–0.1)
Immature Granulocytes: 0 %
Lymphocytes Absolute: 1.4 10*3/uL (ref 0.7–3.1)
Lymphs: 20 %
MCH: 32.6 pg (ref 26.6–33.0)
MCHC: 33.8 g/dL (ref 31.5–35.7)
MCV: 96 fL (ref 79–97)
Monocytes Absolute: 0.8 10*3/uL (ref 0.1–0.9)
Monocytes: 12 %
Neutrophils Absolute: 4.6 10*3/uL (ref 1.4–7.0)
Neutrophils: 67 %
Platelets: 464 10*3/uL — ABNORMAL HIGH (ref 150–450)
RBC: 3.04 x10E6/uL — ABNORMAL LOW (ref 3.77–5.28)
RDW: 12.8 % (ref 11.7–15.4)
WBC: 6.9 10*3/uL (ref 3.4–10.8)

## 2022-08-01 LAB — VITAMIN D 25 HYDROXY (VIT D DEFICIENCY, FRACTURES): Vit D, 25-Hydroxy: 25.1 ng/mL — ABNORMAL LOW (ref 30.0–100.0)

## 2022-08-01 LAB — TSH: TSH: 4.21 u[IU]/mL (ref 0.450–4.500)

## 2022-08-01 LAB — VITAMIN B12: Vitamin B-12: 733 pg/mL (ref 232–1245)
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
## 2022-08-01 NOTE — Telephone Encounter (Signed)
   Telephone encounter was:  Unsuccessful.  08/01/2022 Name: Vanessa Oneal MRN: [id number] DOB: Dec 09, 1950  Unsuccessful outbound call made today to assist with:   Housing  Outreach Attempt:  1st Attempt  A HIPAA compliant voice message was left requesting a return call.  Instructed patient to call back at [phone] at their earliest convenience.  [facility] Guide, Embedded Care Coordination [facility]  [place], [place] [address]  Main Phone: [phone]  E-mail: [email or web address]  Website: [email or web address]

## 2022-08-02 NOTE — Telephone Encounter (Signed)
Returned call to pharmacy, per pharmacist medication is ready for pick up.

## 2022-08-03 DIAGNOSIS — E46 Unspecified protein-calorie malnutrition: Secondary | ICD-10-CM | POA: Diagnosis not present

## 2022-08-03 DIAGNOSIS — Z9181 History of falling: Secondary | ICD-10-CM | POA: Diagnosis not present

## 2022-08-03 DIAGNOSIS — F17210 Nicotine dependence, cigarettes, uncomplicated: Secondary | ICD-10-CM | POA: Diagnosis not present

## 2022-08-03 DIAGNOSIS — S32010D Wedge compression fracture of first lumbar vertebra, subsequent encounter for fracture with routine healing: Secondary | ICD-10-CM | POA: Diagnosis not present

## 2022-08-03 DIAGNOSIS — R7881 Bacteremia: Secondary | ICD-10-CM | POA: Diagnosis not present

## 2022-08-03 DIAGNOSIS — J432 Centrilobular emphysema: Secondary | ICD-10-CM | POA: Diagnosis not present

## 2022-08-07 ENCOUNTER — Encounter: Payer: Self-pay | Admitting: Family Medicine

## 2022-08-07 NOTE — Assessment & Plan Note (Signed)
Discussed with patient that we cannot continue her on xanax. Will refer her to psychiatry. Await their input. She declines any preventative medicine.

## 2022-08-07 NOTE — Assessment & Plan Note (Signed)
Will keep BP and cholesterol under good control. Continue to monitor.  

## 2022-08-07 NOTE — Assessment & Plan Note (Signed)
Feeling better. Finish antibiotics. Continue to monitor. Call with any concerns.

## 2022-08-07 NOTE — Assessment & Plan Note (Signed)
Discussed with patient that we cannot continue her on xanax. Will refer her to psychiatry. Await their input. She declines any preventative medicine.  

## 2022-08-08 ENCOUNTER — Telehealth: Payer: Self-pay

## 2022-08-08 ENCOUNTER — Ambulatory Visit: Payer: Self-pay

## 2022-08-08 NOTE — Patient Outreach (Signed)
  Care Coordination   08/08/2022 Name: Vanessa Oneal MRN: [id number] DOB: 1951/06/27   Care Coordination Outreach Attempts:  An unsuccessful telephone outreach was attempted for a scheduled appointment today.  Follow Up Plan:  Additional outreach attempts will be made to offer the patient care coordination information and services.   Encounter Outcome:  No Answer  Care Coordination Interventions Activated:  No   Care Coordination Interventions:  No, not indicated    Alto Denver RN, MSN, CCM Community Care Coordinator [facility]  [facility] Mobile: [phone]

## 2022-08-08 NOTE — Telephone Encounter (Signed)
   Telephone encounter was:  Unsuccessful.  08/08/2022 Name: Vanessa Oneal MRN: [id number] DOB: 1951-01-30  Unsuccessful outbound call made today to assist with:   Housing  Outreach Attempt:  2nd Attempt  Patient advised she is unable to speak at this time and will call me back tomorrow.  [facility] Guide, Embedded Care Coordination [facility]  [place], [place] [address]  Main Phone: [phone]  E-mail: [email or web address]  Website: [email or web address]

## 2022-08-15 ENCOUNTER — Encounter: Payer: Self-pay | Admitting: *Deleted

## 2022-08-15 ENCOUNTER — Telehealth: Payer: Self-pay | Admitting: *Deleted

## 2022-08-15 NOTE — Patient Outreach (Signed)
  Care Coordination   08/15/2022 Name: Vanessa Oneal MRN: [id number] DOB: 1951/03/21   Care Coordination Outreach Attempts:  An unsuccessful telephone outreach was attempted for a scheduled appointment today.  Follow Up Plan:  Additional outreach attempts will be made to offer the patient care coordination information and services.   Encounter Outcome:  No Answer  Care Coordination Interventions Activated:  No   Care Coordination Interventions:  No, not indicated    Bob Eastwood, LCSW Clinical Social Worker  [facility] Care Management [phone]

## 2022-08-16 ENCOUNTER — Telehealth: Payer: Self-pay

## 2022-08-16 NOTE — Telephone Encounter (Addendum)
   Telephone encounter was:  Successful.  08/16/2022 Name: [name] MRN: [id number] DOB: Jul 14, 1951  Vanessa Oneal is a 71 y.o. year old female who is a primary care patient of Dorcas Carrow, DO . The community resource team was consulted for assistance with  Housing and [facility] guide performed the following interventions: Patient provided with information about care guide support team and interviewed to confirm resource needs. Patient advised she is looking for housing in [place] and possibly food banks. CG is sending out Housing resources for [employer] including subsidized/income housing as well as the [employer]. Pt has been advised: I have mailed the following information and if she has not received the information in 7 to 14 days or have additional questions to please call me back at [phone]. Patient understood.   Follow Up Plan:  No further follow up planned at this time. The patient has been provided with needed resources.  [facility] Guide, Embedded Care Coordination [facility]  [place], [place] [address]  Main Phone: [phone]  E-mail: [email or web address]  Website: [email or web address]

## 2022-08-17 ENCOUNTER — Telehealth: Payer: Self-pay

## 2022-08-17 ENCOUNTER — Institutional Professional Consult (permissible substitution): Payer: Medicaid Other | Admitting: Student in an Organized Health Care Education/Training Program

## 2022-08-17 NOTE — Telephone Encounter (Signed)
Patient has no showed consult x2. Per Dr. Aundria Rud, we will not be able to reschedule per office protocol.   Routing to PCP and Melissa to make aware.

## 2022-08-22 ENCOUNTER — Telehealth: Payer: Self-pay

## 2022-08-22 NOTE — Patient Outreach (Signed)
  Care Coordination   08/22/2022 Name: Vanessa Oneal MRN: [id number] DOB: [birth year]   Care Coordination Outreach Attempts:  An unsuccessful telephone outreach was attempted for a scheduled appointment today. This was a second scheduled visit attempt.   Follow Up Plan:  Additional outreach attempts will be made to offer the patient care coordination information and services.   Encounter Outcome:  No Answer  Care Coordination Interventions Activated:  No   Care Coordination Interventions:  No, not indicated    Noreene Larsson RN, MSN, [facility]  Mobile: [phone]

## 2022-08-23 ENCOUNTER — Telehealth: Payer: Self-pay

## 2022-08-23 NOTE — Chronic Care Management (AMB) (Signed)
  Care Coordination Note  08/23/2022 Name: Vanessa Oneal MRN: [id number] DOB: 11-27-1951  Vanessa Oneal is a 71 y.o. year old female who is a primary care patient of Valerie Roys, DO and is actively engaged with the care management team. I reached out to Isaiah Serge by phone today to assist with re-scheduling a follow up visit with the RN Case Manager  Follow up plan: Unable to make contact on outreach attempts x 2. PCP Valerie Roys, DO notified via routed documentation in medical record.   Noreene Larsson, [place], [place] [address] Direct Dial: [phone] [email or web address]

## 2022-08-23 NOTE — Telephone Encounter (Signed)
Tried to call patient, left message

## 2022-08-24 ENCOUNTER — Telehealth: Payer: Self-pay | Admitting: Family Medicine

## 2022-08-24 NOTE — Telephone Encounter (Signed)
Collie Siad with [facility] called to report that patient was not at home today for her skilled nursing visit.

## 2022-08-28 ENCOUNTER — Ambulatory Visit: Payer: Medicare Other | Admitting: Family Medicine

## 2022-08-31 ENCOUNTER — Telehealth: Payer: Self-pay

## 2022-08-31 NOTE — Telephone Encounter (Signed)
Copied from [place] [id number]. Topic: General - Inquiry >> Aug 31, 2022 10:19 AM Devoria Glassing wrote: Reason for CRM: Rochelle from [employer] calling to follow up on a fax she sent last week.  It is for O2 supplies

## 2022-09-01 DIAGNOSIS — Z9181 History of falling: Secondary | ICD-10-CM | POA: Diagnosis not present

## 2022-09-01 DIAGNOSIS — E46 Unspecified protein-calorie malnutrition: Secondary | ICD-10-CM | POA: Diagnosis not present

## 2022-09-01 DIAGNOSIS — S32010D Wedge compression fracture of first lumbar vertebra, subsequent encounter for fracture with routine healing: Secondary | ICD-10-CM | POA: Diagnosis not present

## 2022-09-01 DIAGNOSIS — J432 Centrilobular emphysema: Secondary | ICD-10-CM | POA: Diagnosis not present

## 2022-09-01 DIAGNOSIS — R7881 Bacteremia: Secondary | ICD-10-CM | POA: Diagnosis not present

## 2022-09-01 DIAGNOSIS — F17210 Nicotine dependence, cigarettes, uncomplicated: Secondary | ICD-10-CM | POA: Diagnosis not present

## 2022-09-04 NOTE — Telephone Encounter (Signed)
Received fax today, placed in folder for signature.

## 2022-09-08 ENCOUNTER — Ambulatory Visit: Payer: Medicare Other | Admitting: Family Medicine

## 2022-09-22 DIAGNOSIS — E46 Unspecified protein-calorie malnutrition: Secondary | ICD-10-CM | POA: Diagnosis not present

## 2022-09-22 DIAGNOSIS — J432 Centrilobular emphysema: Secondary | ICD-10-CM | POA: Diagnosis not present

## 2022-09-22 DIAGNOSIS — R7881 Bacteremia: Secondary | ICD-10-CM | POA: Diagnosis not present

## 2022-09-22 DIAGNOSIS — Z9181 History of falling: Secondary | ICD-10-CM | POA: Diagnosis not present

## 2022-09-22 DIAGNOSIS — S32010D Wedge compression fracture of first lumbar vertebra, subsequent encounter for fracture with routine healing: Secondary | ICD-10-CM | POA: Diagnosis not present

## 2022-09-22 DIAGNOSIS — F17210 Nicotine dependence, cigarettes, uncomplicated: Secondary | ICD-10-CM | POA: Diagnosis not present

## 2023-01-23 ENCOUNTER — Telehealth: Payer: Self-pay | Admitting: Family Medicine

## 2023-01-23 NOTE — Telephone Encounter (Signed)
Copied from [place] [id number]. Topic: Medicare AWV >> Jan 23, 2023 10:13 AM Devoria Glassing wrote: Reason for CRM: Called patient to schedule Medicare Annual Wellness Visit (AWV). Unable to reach patient.  Last date of AWV: 01/26/2022  Please schedule an appointment at any time with Kirke Shaggy, NHA  .  If any questions, please contact me.  Thank you ,  [place] Direct Dial: [phone]

## 2023-02-15 ENCOUNTER — Telehealth: Payer: Self-pay | Admitting: Family Medicine

## 2023-02-15 NOTE — Telephone Encounter (Signed)
Copied from [place] [phone]. Topic: Medicare AWV >> Feb 15, 2023 11:41 AM Devoria Glassing wrote: Reason for CRM: Called patient to schedule Medicare Annual Wellness Visit (AWV). No voicemail available to leave a message.  Last date of AWV: 01/30/22  Please schedule an appointment at any time with Kirke Shaggy, LPN .  If any questions, please contact me.  Thank you ,  Sherol Dade; [place] Direct Dial: [phone]

## 2023-03-06 ENCOUNTER — Ambulatory Visit: Payer: 59 | Admitting: Family Medicine

## 2023-03-26 DIAGNOSIS — R079 Chest pain, unspecified: Secondary | ICD-10-CM | POA: Diagnosis not present

## 2023-03-26 DIAGNOSIS — Z888 Allergy status to other drugs, medicaments and biological substances status: Secondary | ICD-10-CM | POA: Diagnosis not present

## 2023-03-26 DIAGNOSIS — M797 Fibromyalgia: Secondary | ICD-10-CM | POA: Diagnosis not present

## 2023-03-26 DIAGNOSIS — R222 Localized swelling, mass and lump, trunk: Secondary | ICD-10-CM | POA: Diagnosis not present

## 2023-03-26 DIAGNOSIS — R9431 Abnormal electrocardiogram [ECG] [EKG]: Secondary | ICD-10-CM | POA: Diagnosis not present

## 2023-03-26 DIAGNOSIS — E876 Hypokalemia: Secondary | ICD-10-CM | POA: Diagnosis not present

## 2023-03-26 DIAGNOSIS — R1909 Other intra-abdominal and pelvic swelling, mass and lump: Secondary | ICD-10-CM | POA: Diagnosis not present

## 2023-03-26 DIAGNOSIS — R918 Other nonspecific abnormal finding of lung field: Secondary | ICD-10-CM | POA: Diagnosis not present

## 2023-03-26 DIAGNOSIS — R911 Solitary pulmonary nodule: Secondary | ICD-10-CM | POA: Diagnosis not present

## 2023-03-26 DIAGNOSIS — R0789 Other chest pain: Secondary | ICD-10-CM | POA: Diagnosis not present

## 2023-03-26 DIAGNOSIS — Z1152 Encounter for screening for COVID-19: Secondary | ICD-10-CM | POA: Diagnosis not present

## 2023-03-26 DIAGNOSIS — R0602 Shortness of breath: Secondary | ICD-10-CM | POA: Diagnosis not present

## 2023-03-26 DIAGNOSIS — M069 Rheumatoid arthritis, unspecified: Secondary | ICD-10-CM | POA: Diagnosis not present

## 2023-03-26 DIAGNOSIS — J439 Emphysema, unspecified: Secondary | ICD-10-CM | POA: Diagnosis not present

## 2023-03-26 DIAGNOSIS — K219 Gastro-esophageal reflux disease without esophagitis: Secondary | ICD-10-CM | POA: Diagnosis not present

## 2023-03-26 DIAGNOSIS — M25461 Effusion, right knee: Secondary | ICD-10-CM | POA: Diagnosis not present

## 2023-03-26 DIAGNOSIS — R599 Enlarged lymph nodes, unspecified: Secondary | ICD-10-CM | POA: Diagnosis not present

## 2023-03-26 DIAGNOSIS — Z79899 Other long term (current) drug therapy: Secondary | ICD-10-CM | POA: Diagnosis not present

## 2023-03-26 DIAGNOSIS — R109 Unspecified abdominal pain: Secondary | ICD-10-CM | POA: Diagnosis not present

## 2023-03-26 DIAGNOSIS — M799 Soft tissue disorder, unspecified: Secondary | ICD-10-CM | POA: Diagnosis not present

## 2023-03-27 DIAGNOSIS — R1909 Other intra-abdominal and pelvic swelling, mass and lump: Secondary | ICD-10-CM | POA: Diagnosis not present

## 2023-03-27 DIAGNOSIS — R918 Other nonspecific abnormal finding of lung field: Secondary | ICD-10-CM | POA: Diagnosis not present

## 2023-03-27 DIAGNOSIS — R079 Chest pain, unspecified: Secondary | ICD-10-CM | POA: Diagnosis not present

## 2023-03-27 DIAGNOSIS — R911 Solitary pulmonary nodule: Secondary | ICD-10-CM | POA: Diagnosis not present

## 2023-03-27 DIAGNOSIS — R599 Enlarged lymph nodes, unspecified: Secondary | ICD-10-CM | POA: Diagnosis not present

## 2023-03-29 ENCOUNTER — Telehealth: Payer: Self-pay

## 2023-03-29 ENCOUNTER — Ambulatory Visit (INDEPENDENT_AMBULATORY_CARE_PROVIDER_SITE_OTHER): Payer: 59 | Admitting: Family Medicine

## 2023-03-29 ENCOUNTER — Inpatient Hospital Stay: Payer: 59 | Attending: Oncology | Admitting: Oncology

## 2023-03-29 ENCOUNTER — Encounter: Payer: Self-pay | Admitting: Oncology

## 2023-03-29 ENCOUNTER — Inpatient Hospital Stay: Payer: 59

## 2023-03-29 ENCOUNTER — Encounter: Payer: Self-pay | Admitting: Family Medicine

## 2023-03-29 ENCOUNTER — Inpatient Hospital Stay
Admission: RE | Admit: 2023-03-29 | Discharge: 2023-03-29 | Disposition: A | Discharge status: Home / Self Care | Payer: Self-pay | Source: Ambulatory Visit | Attending: Oncology | Admitting: Oncology

## 2023-03-29 ENCOUNTER — Other Ambulatory Visit: Payer: Self-pay

## 2023-03-29 VITALS — BP 99/58 | HR 77 | Temp 95.8°F | Ht 61.0 in | Wt 80.1 lb

## 2023-03-29 VITALS — BP 90/60 | HR 97 | Temp 98.2°F | Ht 61.0 in | Wt 80.1 lb

## 2023-03-29 DIAGNOSIS — E871 Hypo-osmolality and hyponatremia: Secondary | ICD-10-CM | POA: Diagnosis not present

## 2023-03-29 DIAGNOSIS — R591 Generalized enlarged lymph nodes: Secondary | ICD-10-CM | POA: Diagnosis not present

## 2023-03-29 DIAGNOSIS — F17210 Nicotine dependence, cigarettes, uncomplicated: Secondary | ICD-10-CM | POA: Insufficient documentation

## 2023-03-29 DIAGNOSIS — J9859 Other diseases of mediastinum, not elsewhere classified: Secondary | ICD-10-CM

## 2023-03-29 DIAGNOSIS — E876 Hypokalemia: Secondary | ICD-10-CM | POA: Diagnosis not present

## 2023-03-29 DIAGNOSIS — R222 Localized swelling, mass and lump, trunk: Secondary | ICD-10-CM

## 2023-03-29 DIAGNOSIS — R61 Generalized hyperhidrosis: Secondary | ICD-10-CM | POA: Diagnosis not present

## 2023-03-29 DIAGNOSIS — E43 Unspecified severe protein-calorie malnutrition: Secondary | ICD-10-CM | POA: Insufficient documentation

## 2023-03-29 DIAGNOSIS — R0902 Hypoxemia: Secondary | ICD-10-CM | POA: Diagnosis not present

## 2023-03-29 DIAGNOSIS — Z9981 Dependence on supplemental oxygen: Secondary | ICD-10-CM | POA: Diagnosis not present

## 2023-03-29 DIAGNOSIS — I700 Atherosclerosis of aorta: Secondary | ICD-10-CM

## 2023-03-29 DIAGNOSIS — F1110 Opioid abuse, uncomplicated: Secondary | ICD-10-CM

## 2023-03-29 DIAGNOSIS — E440 Moderate protein-calorie malnutrition: Secondary | ICD-10-CM

## 2023-03-29 LAB — COMPREHENSIVE METABOLIC PANEL
ALT: 15 U/L (ref 0–44)
AST: 40 U/L (ref 15–41)
Albumin: 3.1 g/dL — ABNORMAL LOW (ref 3.5–5.0)
Alkaline Phosphatase: 75 U/L (ref 38–126)
Anion gap: 9 (ref 5–15)
BUN: 17 mg/dL (ref 8–23)
CO2: 33 mmol/L — ABNORMAL HIGH (ref 22–32)
Calcium: 8.7 mg/dL — ABNORMAL LOW (ref 8.9–10.3)
Chloride: 87 mmol/L — ABNORMAL LOW (ref 98–111)
Creatinine, Ser: 0.65 mg/dL (ref 0.44–1.00)
GFR, Estimated: >60 mL/min (ref >60)
Glucose, Bld: 131 mg/dL — ABNORMAL HIGH (ref 70–99)
Potassium: 3.8 mmol/L (ref 3.5–5.1)
Sodium: 129 mmol/L — ABNORMAL LOW (ref 135–145)
Total Bilirubin: 0.3 mg/dL (ref 0.3–1.2)
Total Protein: 7 g/dL (ref 6.5–8.1)

## 2023-03-29 LAB — CBC WITH DIFFERENTIAL/PLATELET
Abs Immature Granulocytes: 0.03 10*3/uL (ref 0.00–0.07)
Basophils Absolute: 0 10*3/uL (ref 0.0–0.1)
Basophils Relative: 0 %
Eosinophils Absolute: 0.1 10*3/uL (ref 0.0–0.5)
Eosinophils Relative: 1 %
HCT: 30.6 % — ABNORMAL LOW (ref 36.0–46.0)
Hemoglobin: 10.3 g/dL — ABNORMAL LOW (ref 12.0–15.0)
Immature Granulocytes: 0 %
Lymphocytes Relative: 13 %
Lymphs Abs: 1 10*3/uL (ref 0.7–4.0)
MCH: 31.2 pg (ref 26.0–34.0)
MCHC: 33.7 g/dL (ref 30.0–36.0)
MCV: 92.7 fL (ref 80.0–100.0)
Monocytes Absolute: 0.9 10*3/uL (ref 0.1–1.0)
Monocytes Relative: 12 %
Neutro Abs: 5.5 10*3/uL (ref 1.7–7.7)
Neutrophils Relative %: 74 %
Platelets: 262 10*3/uL (ref 150–400)
RBC: 3.3 MIL/uL — ABNORMAL LOW (ref 3.87–5.11)
RDW: 12.7 % (ref 11.5–15.5)
WBC: 7.5 10*3/uL (ref 4.0–10.5)
nRBC: 0 % (ref 0.0–0.2)

## 2023-03-29 LAB — LACTATE DEHYDROGENASE: LDH: 462 U/L — ABNORMAL HIGH (ref 98–192)

## 2023-03-29 MED ORDER — PROMETHAZINE HCL 25 MG/ML IJ SOLN
25.0000 mg | Freq: Four times a day (QID) | INTRAMUSCULAR | Status: AC | PRN
Start: 2023-03-29 — End: ?
  Administered 2023-03-29: 25 mg via INTRAMUSCULAR

## 2023-03-29 MED ORDER — PROMETHAZINE HCL 50 MG/ML IJ SOLN
25.0000 mg | Freq: Once | INTRAMUSCULAR | Status: DC
Start: 2023-03-29 — End: 2023-04-21

## 2023-03-29 MED ORDER — OXYCODONE-ACETAMINOPHEN 10-325 MG PO TABS: 1.0000 | ORAL_TABLET | Freq: Four times a day (QID) | ORAL | 0 refills | Status: AC | PRN

## 2023-03-29 MED ORDER — NALOXONE HCL 4 MG/0.1ML NA LIQD
NASAL | 12 refills | Status: DC
Start: 2023-03-29 — End: 2023-04-21

## 2023-03-29 NOTE — Progress Notes (Signed)
Patient wants to know what she has to go through.Patient wants to know if she can get a prescription for ensure, also wants to know if the cancer center can request CT scan be sent over.

## 2023-03-29 NOTE — Progress Notes (Signed)
Hematology/Oncology Consult Note [phone] [phone]     REFERRING PROVIDER: Dorcas Carrow, DO    CHIEF COMPLAINTS/PURPOSE OF CONSULTATION:  Chest wall mass  ASSESSMENT & PLAN:   Lymphadenopathy Imaging findings were reviewed and discussed with patient. [facility] CT images are not available to me at the time of dictation.  Will obtain images Recommend PET scan for further evaluation. Tentatively plan ultrasound-guided biopsy of right supraclavicular lymphadenopathy to establish tissue diagnosis.  If not feasible, consider biopsying of retroperitoneal soft tissue mass. Check CBC, CMP, myeloma panel, peripheral blood flow cytometry, LDH,  Protein-calorie malnutrition, severe Refer to nutritionist for evaluation. Recommend patient to start nutrition supplements.  Samples provided to patient.  Hyponatremia Today's blood work showed hyponatremia. Will check serum osmolarity, urine osmolarity and urine sodium.   Orders Placed This Encounter  Procedures   NM PET Image Initial (PI) Skull Base To Thigh    Standing Status:   Future    Standing Expiration Date:   03/28/2024    Order Specific Question:   If indicated for the ordered procedure, I authorize the administration of a radiopharmaceutical per Radiology protocol    Answer:   Yes    Order Specific Question:   Preferred imaging location?    Answer:   [facility]   [place] CORE BIOPSY (LYMPH NODES)    Will also need Flow cytometry collected.    Standing Status:   Future    Standing Expiration Date:   03/28/2024    Order Specific Question:   Lab orders requested (DO NOT place separate lab orders, these will be automatically ordered during procedure specimen collection):    Answer:   Surgical Pathology    Order Specific Question:   Lab orders requested (DO NOT place separate lab orders, these will be automatically ordered during procedure specimen collection):    Answer:   Other    Order Specific Question:    Reason for Exam (SYMPTOM  OR DIAGNOSIS REQUIRED)    Answer:   US guided biopsy of supraclavicular LN biopsy; suspect lymphom    Order Specific Question:   Preferred location?    Answer:   [facility]   CT OUTSIDE FILMS CHEST    CTA chest w contrast on 4/23 at [facility]    Standing Status:   Future    Number of Occurrences:   1    Standing Expiration Date:   03/28/2024    Order Specific Question:   Where was this CD imported?    Answer:   [facility]   CT OUTSIDE FILMS BODY/ABD/PELVIS    CT abd pel w contrast from 4/23    Standing Status:   Future    Number of Occurrences:   1    Standing Expiration Date:   03/28/2024    Order Specific Question:   Where was this CD imported?    Answer:   [facility]   CBC with Differential/Platelet    Standing Status:   Future    Number of Occurrences:   1    Standing Expiration Date:   03/28/2024   Comprehensive metabolic panel    Standing Status:   Future    Number of Occurrences:   1    Standing Expiration Date:   03/28/2024   Flow cytometry panel-leukemia/lymphoma work-up    Standing Status:   Future    Number of Occurrences:   1    Standing Expiration Date:   03/28/2024   Lactate dehydrogenase    Standing  Status:   Future    Number of Occurrences:   1    Standing Expiration Date:   03/28/2024   Multiple Myeloma Panel (SPEP&IFE w/QIG)    Standing Status:   Future    Number of Occurrences:   1    Standing Expiration Date:   03/28/2024   Kappa/lambda light chains    Standing Status:   Future    Number of Occurrences:   1    Standing Expiration Date:   03/28/2024   Ambulatory Referral to [facility]    Referral Priority:   Routine    Referral Type:   Consultation    Referral Reason:   Specialty Services Required    Number of Visits Requested:   1   Follow-up 1 week after biopsy. All questions were answered. The patient knows to call the clinic with any problems, questions or concerns.  Rickard Patience, MD, PhD [facility]  Hematology Oncology 03/29/2023    HISTORY OF PRESENTING ILLNESS:  Vanessa Oneal 72 y.o. female presents to establish care for chest wall mass Patient has had unintentional weight loss for the past 7 to 8 months.  She is not able to provide details of her previous weight.  + Night sweats, denies fever, nausea vomiting.  Appetite is very poor  She currently lives with her niece who accompanied her to today's visit. 03/27/2023, patient presented to [facility] emergency room due to chest pain. CTA chest with contrast showed no acute pulmonary embolism.  Extensive conglomerate soft tissue noted throughout the mediastinum which extends into the right supraclavicular/base of neck region.  Soft tissue encased but does not exert mass effect on mediastinal vasculature.  1.5 cm left lower lobe pulmonary nodule.  Trace left pleural effusion, malignant effusion is not excluded. CT abdomen pelvis with contrast showed Large retroperitoneal soft tissue mass favored to represent a conglomerate of enlarged lymph nodes which appear to encase but not exert significant mass effect on the adjacent upper abdominal vasculature measuring approximately 6.3 x 6.0 x 8.4 cm and abutting the liver, lesser curvature of the stomach and superior pancreas. Findings are favored to represent lymphoma. The abdominal mass exhibits extrinsic compression on the distal esophagus which may be contributing to patient's symptoms of GERD.      MEDICAL HISTORY:  Past Medical History:  Diagnosis Date   Arthritis    RA   Depression    Fibromyalgia    GERD (gastroesophageal reflux disease)    H/O drug dependence (HCC)    opiods   Wears dentures    full upper    SURGICAL HISTORY: Past Surgical History:  Procedure Laterality Date   BREAST SURGERY     Silicone implants, then removal   CATARACT EXTRACTION W/PHACO Left 05/06/2019   Procedure: CATARACT EXTRACTION PHACO AND INTRAOCULAR LENS PLACEMENT (IOC) LEFT;  Surgeon: Galen Manila,  MD;  [facility]: [facility];  Service: Ophthalmology;  Laterality: Left;   TUBAL LIGATION      SOCIAL HISTORY: Social History   Socioeconomic History   Marital status: Widowed    Spouse name: Not on file   Number of children: Not on file   Years of education: Not on file   Highest education level: Not on file  Occupational History   Occupation: retired  Tobacco Use   Smoking status: Light Smoker    Packs/day: 0.25    Years: 56.00    Additional pack years: 0.00    Total pack years: 14.00    Types:  Cigarettes    Last attempt to quit: 11/04/2019    Years since quitting: 3.4   Smokeless tobacco: Never   Tobacco comments:    1 pack with last a couple days   Vaping Use   Vaping Use: Former  Substance and Sexual Activity   Alcohol use: Not Currently   Drug use: No   Sexual activity: Not Currently  Other Topics Concern   Not on file  Social History Narrative   ** Merged History Encounter **       Social Determinants of Health   Financial Resource Strain: Low Risk  (07/31/2022)   Overall Financial Resource Strain (CARDIA)    Difficulty of Paying Living Expenses: Not hard at all  Food Insecurity: No Food Insecurity (07/31/2022)   Hunger Vital Sign    Worried About Running Out of Food in the Last Year: Never true    Ran Out of Food in the Last Year: Never true  Transportation Needs: No Transportation Needs (07/31/2022)   PRAPARE - Administrator, Civil Service (Medical): No    Lack of Transportation (Non-Medical): No  Recent Concern: Transportation Needs - Unmet Transportation Needs (07/25/2022)   PRAPARE - Transportation    Lack of Transportation (Medical): Yes    Lack of Transportation (Non-Medical): Yes  Physical Activity: Insufficiently Active (07/31/2022)   Exercise Vital Sign    Days of Exercise per Week: 7 days    Minutes of Exercise per Session: 10 min  Stress: Stress Concern Present (07/31/2022)   [employer] of Occupational Health -  Occupational Stress Questionnaire    Feeling of Stress : To some extent  Social Connections: Socially Isolated (07/31/2022)   Social Connection and Isolation Panel [NHANES]    Frequency of Communication with Friends and Family: Twice a week    Frequency of Social Gatherings with Friends and Family: Once a week    Attends Religious Services: Never    Database administrator or Organizations: Not on file    Attends Banker Meetings: Never    Marital Status: Widowed  Intimate Partner Violence: Not At Risk (01/30/2022)   Humiliation, Afraid, Rape, and Kick questionnaire    Fear of Current or Ex-Partner: No    Emotionally Abused: No    Physically Abused: No    Sexually Abused: No    FAMILY HISTORY: Family History  Problem Relation Age of Onset   Suicidality Father    COPD Sister     ALLERGIES:  is allergic to gabapentin.  MEDICATIONS:  Current Outpatient Medications  Medication Sig Dispense Refill   clonazePAM (KLONOPIN) 0.5 MG tablet Take by mouth.     naloxone (NARCAN) nasal spray 4 mg/0.1 mL 1 spray into the nostril 1x, may repeat dose in alternate nostrils every 2-3 minutes until EMS arrives 2 each 12   oxyCODONE-acetaminophen (PERCOCET) 10-325 MG tablet Take 1 tablet by mouth every 6 (six) hours as needed for up to 5 days for pain. 20 tablet 0   promethazine (PHENERGAN) 25 MG tablet TAKE 1/2 TABLET BY MOUTH EVERY 8 HOURS AS NEEDED FOR NAUSEA AND VOMITING 30 tablet 1   albuterol (VENTOLIN HFA) 108 (90 Base) MCG/ACT inhaler Inhale 2 puffs into the lungs every 6 (six) hours as needed for wheezing or shortness of breath. (Patient not taking: Reported on 03/29/2023) 18 g 3   Fluticasone-Umeclidin-Vilant (TRELEGY ELLIPTA) 100-62.5-25 MCG/ACT AEPB Inhale 1 Inhalation into the lungs daily in the afternoon. (Patient not taking: Reported on 03/29/2023) 60 each  3   Current Facility-Administered Medications  Medication Dose Route Frequency Provider Last Rate Last Admin    promethazine (PHENERGAN) injection 25 mg  25 mg Intramuscular Once Johnson, Megan P, DO       promethazine (PHENERGAN) injection 25 mg  25 mg Intramuscular Q6H PRN Johnson, Megan P, DO   25 mg at 03/29/23 1142    Review of Systems  Constitutional:  Positive for appetite change, fatigue and unexpected weight change. Negative for chills and fever.  HENT:   Negative for hearing loss and voice change.   Eyes:  Negative for eye problems.  Respiratory:  Positive for shortness of breath. Negative for chest tightness and cough.   Cardiovascular:  Negative for chest pain.  Gastrointestinal:  Negative for abdominal distention, abdominal pain and blood in stool.       Stomach discomfort  Endocrine: Negative for hot flashes.  Genitourinary:  Negative for difficulty urinating and frequency.   Musculoskeletal:  Negative for arthralgias.  Skin:  Negative for itching and rash.  Neurological:  Negative for extremity weakness.  Hematological:  Negative for adenopathy.  Psychiatric/Behavioral:  Negative for confusion.      PHYSICAL EXAMINATION: ECOG PERFORMANCE STATUS: 2 - Symptomatic, <50% confined to bed  Vitals:   03/29/23 1510  BP: (!) 99/58  Pulse: 77  Temp: (!) 95.8 F (35.4 C)  SpO2: (!) 80%   Filed Weights   03/29/23 1510  Weight: 80 lb 1.6 oz (36.3 kg)    Physical Exam Constitutional:      General: She is not in acute distress.    Appearance: She is not diaphoretic.     Comments:  cachectic  HENT:     Head: Normocephalic and atraumatic.     Nose: Nose normal.     Mouth/Throat:     Pharynx: No oropharyngeal exudate.  Eyes:     General: No scleral icterus.    Pupils: Pupils are equal, round, and reactive to light.  Cardiovascular:     Rate and Rhythm: Normal rate and regular rhythm.  Pulmonary:     Effort: Pulmonary effort is normal. No respiratory distress.     Breath sounds: No wheezing.  Abdominal:     General: Bowel sounds are normal. There is no distension.      Palpations: Abdomen is soft.  Musculoskeletal:        General: Normal range of motion.     Cervical back: Normal range of motion and neck supple.  Lymphadenopathy:     Cervical: Cervical adenopathy present.  Skin:    General: Skin is warm and dry.     Findings: No erythema.  Neurological:     Mental Status: She is alert. Mental status is at baseline.     Cranial Nerves: No cranial nerve deficit.     Motor: No abnormal muscle tone.     Coordination: Coordination normal.  Psychiatric:        Mood and Affect: Mood and affect normal.      LABORATORY DATA:  I have reviewed the data as listed    Latest Ref Rng & Units 03/29/2023    3:53 PM 07/31/2022   11:40 AM 07/18/2022    4:29 AM  CBC  WBC 4.0 - 10.5 K/uL 7.5  6.9  18.5   Hemoglobin 12.0 - 15.0 g/dL 16.1  9.9  09.6   Hematocrit 36.0 - 46.0 % 30.6  29.3  29.5   Platelets 150 - 400 K/uL 262  464  190  Latest Ref Rng & Units 03/29/2023    3:53 PM 07/31/2022   11:40 AM 07/21/2022    4:25 AM  CMP  Glucose 70 - 99 mg/dL 161  096  91   BUN 8 - 23 mg/dL 17  15  22    Creatinine 0.44 - 1.00 mg/dL 0.45  4.09  8.11   Sodium 135 - 145 mmol/L 129  137  137   Potassium 3.5 - 5.1 mmol/L 3.8  5.0  4.0   Chloride 98 - 111 mmol/L 87  97  100   CO2 22 - 32 mmol/L 33  28  29   Calcium 8.9 - 10.3 mg/dL 8.7  8.9  8.3   Total Protein 6.5 - 8.1 g/dL 7.0  6.1    Total Bilirubin 0.3 - 1.2 mg/dL 0.3  0.2    Alkaline Phos 38 - 126 U/L 75  99    AST 15 - 41 U/L 40  17    ALT 0 - 44 U/L 15  13       RADIOGRAPHIC STUDIES: I have personally reviewed the radiological images as listed and agreed with the findings in the report. No results found.

## 2023-03-29 NOTE — Assessment & Plan Note (Signed)
Refer to nutritionist for evaluation. Recommend patient to start nutrition supplements.  Samples provided to patient.

## 2023-03-29 NOTE — Telephone Encounter (Signed)
US guided biopsy of supraclavicular LN biopsy Follow up MD 1 week after biopsy.

## 2023-03-29 NOTE — Assessment & Plan Note (Signed)
Took one of her friend's methadone this AM. Discussed that she cannot do that and the interactions between the klonopin and the methadone. Given her cancer pain- we will send in percocet for her, but she is not to start it until at least 8 hours after she took the methadone and is not to take anything other than what we or her specialists give her. Narcan sent in in case of accidental overdose and discussed with niece how to use it.

## 2023-03-29 NOTE — Assessment & Plan Note (Signed)
Imaging findings were reviewed and discussed with patient. [facility] CT images are not available to me at the time of dictation.  Will obtain images Recommend PET scan for further evaluation. Tentatively plan ultrasound-guided biopsy of right supraclavicular lymphadenopathy to establish tissue diagnosis.  If not feasible, consider biopsying of retroperitoneal soft tissue mass. Check CBC, CMP, myeloma panel, peripheral blood flow cytometry, LDH,

## 2023-03-29 NOTE — Progress Notes (Signed)
BP 90/60   Pulse 97   Temp 98.2 F (36.8 C) (Oral)   Ht 5\' 1"  (1.549 m)   Wt 80 lb 1.6 oz (36.3 kg)   SpO2 (!) 78%   BMI 15.13 kg/m    Subjective:    Patient ID: Vanessa Oneal, female    DOB: [birth year], 72 y.o.   MRN: [id number]  HPI: Vanessa Oneal is a 72 y.o. female  Chief Complaint  Patient presents with   Hospitalization Follow-up   Abdominal Pain   Chest Pain    Patient was diagnosed with Cancer during hospitalization. Patient was informed that she has a large mass in her chest wall.    ER FOLLOW UP Time since discharge: 2 days Hospital/facility: [facility] [place] Diagnosis: Chest Wall Mass Procedures/tests:  03/27/2023 1:33 AM EDT  Large retroperitoneal soft tissue mass favored to represent a conglomerate of enlarged lymph nodes which appear to encase but not exert significant mass effect on the adjacent upper abdominal vasculature measuring approximately 6.3 x 6.0 x 8.4 cm and abutting the liver, lesser curvature of the stomach and superior pancreas. Findings are favored to represent lymphoma.  The abdominal mass exhibits extrinsic compression on the distal esophagus which may be contributing to patient's symptoms of GERD.  The findings of this study were discussed epic secure messenger with DR. DANIEL W MARKWALTER by Dr. Windell Norfolk on 03/27/2023 1:25 AM.  -----------------------------------------------    Results - CT Abdomen Pelvis W IV Contrast Only (03/27/2023 12:34 AM EDT) Narrative  03/27/2023 1:33 AM EDT  EXAM: CT ABDOMEN PELVIS W CONTRAST ACCESSION: [id number] UN CLINICAL INDICATION: 72 years old with Abdominal guarding (mostly left side)    COMPARISON: Concurrent CTA of the chest  TECHNIQUE: A helical CT scan of the abdomen and pelvis was obtained following IV contrast from the lung bases through the pubic symphysis. Images were reconstructed in the axial plane. Coronal and sagittal reformatted images were also provided for further  evaluation.   FINDINGS:  LOWER CHEST: Please see dedicated CT chest for characterization of findings above the diaphragm.  LIVER: Normal liver contour.  No focal liver lesions.  BILIARY: The gallbladder is normal in appearance. No biliary ductal dilatation.    SPLEEN: Normal in size and contour.  PANCREAS: Normal pancreatic contour with no mild mass effect from large conglomerate retroperitoneal soft tissue mass. No focal lesions.  No ductal dilation.  ADRENAL GLANDS: Normal appearance of the adrenal glands.  KIDNEYS/URETERS: Symmetric renal enhancement.  No hydronephrosis.  No solid renal mass.  BLADDER: Bladder is distended.  REPRODUCTIVE ORGANS: Unremarkable.  GI TRACT: No findings of bowel obstruction or acute inflammation. The appendix is not clearly identified but there are no secondary signs of appendicitis.  PERITONEUM, RETROPERITONEUM, MESENTERY AND LYMPH NODES: No free air.  No ascites.  No fluid collection. Large retroperitoneal soft tissue mass which is favored to represent a conglomerate of enlarged lymph nodes and less likely a multiseptated mass lesion. This conglomerate lesion appears to encase but not exert significant mass effect on the adjacent vasculature. The mass measures approximately 6.3 x 6.0 x 8.4 cm (2:24, 4:26) and abuts the liver, lesser curvature of the stomach, and superior pancreas. 1.4 cm left periaortic lymph node (2:24).  Aortic lymph node (2:24. Additional prominent retroperitoneal lymph nodes.  VESSELS: Hepatic and portal veins are patent.  Normal caliber aorta with scattered calcifications.    BONES and SOFT TISSUES: Multilevel degenerative changes of the visualized spine. No aggressive osseous lesions. No focal soft  tissue lesions.      Results - CT Abdomen Pelvis W IV Contrast Only (03/27/2023 12:34 AM EDT) Procedure Note  Defreitas, Worthy Keeler, MD - 03/27/2023  Formatting of this note might be different from the original. EXAM: CT ABDOMEN  PELVIS W CONTRAST ACCESSION: [id number] UN CLINICAL INDICATION: 72 years old with Abdominal guarding (mostly left side)  COMPARISON: Concurrent CTA of the chest  TECHNIQUE: A helical CT scan of the abdomen and pelvis was obtained following IV contrast from the lung bases through the pubic symphysis. Images were reconstructed in the axial plane. Coronal and sagittal reformatted images were also provided for further evaluation.   FINDINGS:  LOWER CHEST: Please see dedicated CT chest for characterization of findings above the diaphragm.  LIVER: Normal liver contour. No focal liver lesions.  BILIARY: The gallbladder is normal in appearance. No biliary ductal dilatation.  SPLEEN: Normal in size and contour.  PANCREAS: Normal pancreatic contour with no mild mass effect from large conglomerate retroperitoneal soft tissue mass. No focal lesions. No ductal dilation.  ADRENAL GLANDS: Normal appearance of the adrenal glands.  KIDNEYS/URETERS: Symmetric renal enhancement. No hydronephrosis. No solid renal mass.  BLADDER: Bladder is distended.  REPRODUCTIVE ORGANS: Unremarkable.  GI TRACT: No findings of bowel obstruction or acute inflammation. The appendix is not clearly identified but there are no secondary signs of appendicitis.  PERITONEUM, RETROPERITONEUM, MESENTERY AND LYMPH NODES: No free air. No ascites. No fluid collection. Large retroperitoneal soft tissue mass which is favored to represent a conglomerate of enlarged lymph nodes and less likely a multiseptated mass lesion. This conglomerate lesion appears to encase but not exert significant mass effect on the adjacent vasculature. The mass measures approximately 6.3 x 6.0 x 8.4 cm (2:24, 4:26) and abuts the liver, lesser curvature of the stomach, and superior pancreas. 1.4 cm left periaortic lymph node (2:24).  Aortic lymph node (2:24. Additional prominent retroperitoneal lymph nodes.  VESSELS: Hepatic and portal veins are patent.  Normal caliber aorta with scattered calcifications.  BONES and SOFT TISSUES: Multilevel degenerative changes of the visualized spine. No aggressive osseous lesions. No focal soft tissue lesions.   IMPRESSION: Large retroperitoneal soft tissue mass favored to represent a conglomerate of enlarged lymph nodes which appear to encase but not exert significant mass effect on the adjacent upper abdominal vasculature measuring approximately 6.3 x 6.0 x 8.4 cm and abutting the liver, lesser curvature of the stomach and superior pancreas. Findings are favored to represent lymphoma.  The abdominal mass exhibits extrinsic compression on the distal esophagus which may be contributing to patient's symptoms of GERD.   Consultants: None New medications: Klonopin Discharge instructions:  Follow up with undiagnosed cancer clinic Status: stable  Still feeling bad. She notes that it feels like the worse heartburn she's ever had. She has had swelling in her legs. She went to the ER the day before yesterday and was told that she has a chest mass concerning for lymphoma. She has been in significant amounts of pain. She took 1/2 of one of her friend's methadone this AM and has been taking that to help with her pain. It has not been helping. Pain is burning and sharp and epigastric with radiation into her L side. Nothing is making it better or worse. She is very uncomfortable. She does have in   Relevant past medical, surgical, family and social history reviewed and updated as indicated. Interim medical history since our last visit reviewed. Allergies and medications reviewed and updated.  Review of Systems  Constitutional: Negative.   HENT: Negative.    Respiratory:  Positive for chest tightness and shortness of breath. Negative for apnea, cough, choking, wheezing and stridor.   Cardiovascular: Negative.   Gastrointestinal:  Positive for abdominal pain. Negative for abdominal distention, anal bleeding, blood in  stool, constipation, diarrhea, nausea, rectal pain and vomiting.  Musculoskeletal: Negative.   Skin: Negative.   Psychiatric/Behavioral: Negative.      Per HPI unless specifically indicated above     Objective:    BP 90/60   Pulse 97   Temp 98.2 F (36.8 C) (Oral)   Ht 5\' 1"  (1.549 m)   Wt 80 lb 1.6 oz (36.3 kg)   SpO2 (!) 78%   BMI 15.13 kg/m   Wt Readings from Last 3 Encounters:  03/29/23 80 lb 1.6 oz (36.3 kg)  07/31/22 79 lb 12.8 oz (36.2 kg)  07/23/22 75 lb 2.8 oz (34.1 kg)    Physical Exam Vitals and nursing note reviewed.  Constitutional:      General: She is not in acute distress.    Appearance: Normal appearance. She is well-developed. She is cachectic. She is ill-appearing. She is not toxic-appearing or diaphoretic.     Interventions: Nasal cannula in place.  HENT:     Head: Normocephalic and atraumatic.     Right Ear: External ear normal.     Left Ear: External ear normal.     Nose: Nose normal.     Mouth/Throat:     Mouth: Mucous membranes are moist.     Pharynx: Oropharynx is clear.  Eyes:     General: No scleral icterus.       Right eye: No discharge.        Left eye: No discharge.     Extraocular Movements: Extraocular movements intact.     Conjunctiva/sclera: Conjunctivae normal.     Pupils: Pupils are equal, round, and reactive to light.  Cardiovascular:     Rate and Rhythm: Normal rate and regular rhythm.     Pulses: Normal pulses.     Heart sounds: Normal heart sounds. No murmur heard.    No friction rub. No gallop.  Pulmonary:     Effort: Pulmonary effort is normal. No respiratory distress.     Breath sounds: Normal breath sounds. No stridor. No wheezing, rhonchi or rales.  Chest:     Chest wall: No tenderness.  Abdominal:     General: Abdomen is flat.     Palpations: Abdomen is soft.     Tenderness: There is abdominal tenderness in the epigastric area.  Musculoskeletal:        General: Normal range of motion.     Cervical back: Normal  range of motion and neck supple.     Right ankle: Swelling present.     Left ankle: Swelling present.     Comments: 2+ edema bilaterally  Skin:    General: Skin is warm and dry.     Capillary Refill: Capillary refill takes less than 2 seconds.     Coloration: Skin is not jaundiced or pale.     Findings: No bruising, erythema, lesion or rash.  Neurological:     General: No focal deficit present.     Mental Status: She is alert and oriented to person, place, and time. Mental status is at baseline.  Psychiatric:        Mood and Affect: Mood normal.        Behavior: Behavior normal.  Thought Content: Thought content normal.        Judgment: Judgment normal.     Results for orders placed or performed in visit on 07/31/22  Comprehensive metabolic panel  Result Value Ref Range   Glucose 103 (H) 70 - 99 mg/dL   BUN 15 8 - 27 mg/dL   Creatinine, Ser 1.61 (L) 0.57 - 1.00 mg/dL   eGFR 99 >09 UE/AVW/0.98   BUN/Creatinine Ratio 28 12 - 28   Sodium 137 134 - 144 mmol/L   Potassium 5.0 3.5 - 5.2 mmol/L   Chloride 97 96 - 106 mmol/L   CO2 28 20 - 29 mmol/L   Calcium 8.9 8.7 - 10.3 mg/dL   Total Protein 6.1 6.0 - 8.5 g/dL   Albumin 2.9 (L) 3.9 - 4.9 g/dL   Globulin, Total 3.2 1.5 - 4.5 g/dL   Albumin/Globulin Ratio 0.9 (L) 1.2 - 2.2   Bilirubin Total 0.2 0.0 - 1.2 mg/dL   Alkaline Phosphatase 99 44 - 121 IU/L   AST 17 0 - 40 IU/L   ALT 13 0 - 32 IU/L  CBC with Differential/Platelet  Result Value Ref Range   WBC 6.9 3.4 - 10.8 x10E3/uL   RBC 3.04 (L) 3.77 - 5.28 x10E6/uL   Hemoglobin 9.9 (L) 11.1 - 15.9 g/dL   Hematocrit 11.9 (L) 14.7 - 46.6 %   MCV 96 79 - 97 fL   MCH 32.6 26.6 - 33.0 pg   MCHC 33.8 31.5 - 35.7 g/dL   RDW 82.9 56.2 - 13.0 %   Platelets 464 (H) 150 - 450 x10E3/uL   Neutrophils 67 Not Estab. %   Lymphs 20 Not Estab. %   Monocytes 12 Not Estab. %   Eos 1 Not Estab. %   Basos 0 Not Estab. %   Neutrophils Absolute 4.6 1.4 - 7.0 x10E3/uL   Lymphocytes  Absolute 1.4 0.7 - 3.1 x10E3/uL   Monocytes Absolute 0.8 0.1 - 0.9 x10E3/uL   EOS (ABSOLUTE) 0.1 0.0 - 0.4 x10E3/uL   Basophils Absolute 0.0 0.0 - 0.2 x10E3/uL   Immature Granulocytes 0 Not Estab. %   Immature Grans (Abs) 0.0 0.0 - 0.1 x10E3/uL  B12  Result Value Ref Range   Vitamin B-12 733 232 - 1,245 pg/mL  VITAMIN D 25 Hydroxy (Vit-D Deficiency, Fractures)  Result Value Ref Range   Vit D, 25-Hydroxy 25.1 (L) 30.0 - 100.0 ng/mL  TSH  Result Value Ref Range   TSH 4.210 0.450 - 4.500 uIU/mL      Assessment & Plan:   Problem List Items Addressed This Visit       Cardiovascular and Mediastinum   Aortic atherosclerosis    Will keep BP and cholesterol under good control. Continue to monitor. Call with any concerns.         Respiratory   Hypoxia    Improved on her oxygen. Stressed the importance of staying on her oxygen. She notes that she has it at home and will use it.         Other   Opiate abuse, continuous    Took one of her friend's methadone this AM. Discussed that she cannot do that and the interactions between the klonopin and the methadone. Given her cancer pain- we will send in percocet for her, but she is not to start it until at least 8 hours after she took the methadone and is not to take anything other than what we or her specialists give her. Narcan sent in  in case of accidental overdose and discussed with niece how to use it.       Relevant Medications   promethazine (PHENERGAN) injection 25 mg   Malnutrition    Weight is stable from August, but wasting is worse. Has stopped her mirtazpine. May benefit from restarting it- will wait on oncology appointment.       Oxygen dependent    Improved on her oxygen. Stressed the importance of staying on her oxygen. She notes that she has it at home and will use it.       Chest mass - Primary    Concern for lymphoma. Will get her to get copies of her imaging from [facility] to bring to her appointments. Appointment with  oncology scheduled for this afternoon at [facility]. Spoke with Dr. Claudine Mouton at general surgery about getting her scheduled for biopsy for tissue diagnosis- they will get her scheduled sometime in the next week or so. Continue to monitor closely.       Relevant Medications   promethazine (PHENERGAN) injection 25 mg   Other Relevant Orders   Ambulatory referral to Hematology / Oncology   Ambulatory referral to General Surgery   Other Visit Diagnoses     Hypokalemia       Patient left today before being drawn. Will check to see if oncology checks BMP today- if not will get her back tomorrow for blood draw.   Relevant Orders   CBC with Differential/Platelet   Basic metabolic panel        Follow up plan: Return in about 1 week (around 04/05/2023).  >45 minutes spent with patient and her niece today

## 2023-03-29 NOTE — Telephone Encounter (Signed)
[facility] Images requested via [employer]:   CTA chest 03/27/23 CT abd pelv 03/27/23

## 2023-03-29 NOTE — Progress Notes (Signed)
Scheduled appointment for 06/03/2023 @ 10:20 am.

## 2023-03-29 NOTE — Assessment & Plan Note (Signed)
Improved on her oxygen. Stressed the importance of staying on her oxygen. She notes that she has it at home and will use it.

## 2023-03-29 NOTE — Assessment & Plan Note (Signed)
Concern for lymphoma. Will get her to get copies of her imaging from [facility] to bring to her appointments. Appointment with oncology scheduled for this afternoon at [facility]. Spoke with Dr. Claudine Mouton at general surgery about getting her scheduled for biopsy for tissue diagnosis- they will get her scheduled sometime in the next week or so. Continue to monitor closely.

## 2023-03-29 NOTE — Assessment & Plan Note (Signed)
Improved on her oxygen. Stressed the importance of staying on her oxygen. She notes that she has it at home and will use it.  

## 2023-03-29 NOTE — Assessment & Plan Note (Signed)
Weight is stable from August, but wasting is worse. Has stopped her mirtazpine. May benefit from restarting it- will wait on oncology appointment.

## 2023-03-29 NOTE — Assessment & Plan Note (Signed)
Today's blood work showed hyponatremia. Will check serum osmolarity, urine osmolarity and urine sodium.

## 2023-03-29 NOTE — Assessment & Plan Note (Signed)
Will keep BP and cholesterol under good control. Continue to monitor. Call with any concerns.  

## 2023-03-30 ENCOUNTER — Telehealth: Payer: Self-pay | Admitting: Family Medicine

## 2023-03-30 DIAGNOSIS — I10 Essential (primary) hypertension: Secondary | ICD-10-CM | POA: Diagnosis not present

## 2023-03-30 DIAGNOSIS — Z9981 Dependence on supplemental oxygen: Secondary | ICD-10-CM

## 2023-03-30 DIAGNOSIS — R0902 Hypoxemia: Secondary | ICD-10-CM

## 2023-03-30 DIAGNOSIS — R000 Tachycardia, unspecified: Secondary | ICD-10-CM | POA: Diagnosis not present

## 2023-03-30 DIAGNOSIS — R069 Unspecified abnormalities of breathing: Secondary | ICD-10-CM | POA: Diagnosis not present

## 2023-03-30 LAB — KAPPA/LAMBDA LIGHT CHAINS
Kappa free light chain: 70.2 mg/L — ABNORMAL HIGH (ref 3.3–19.4)
Kappa, lambda light chain ratio: 1.23 (ref 0.26–1.65)
Lambda free light chains: 56.9 mg/L — ABNORMAL HIGH (ref 5.7–26.3)

## 2023-03-30 NOTE — Telephone Encounter (Signed)
The patient called in stating she saw the provider yesterday at which time she had oxygen at home and told her provider she did but when she got home she found out for some reason her roommate had called someone to come pick it up and now she does not have any oxygen. Please assist patient further as she needs to be on this all the time as was recently diagnosed with Cancer.  She uses  [facility] - [place], [place] - [address] Phone: [phone]  Fax: [phone]

## 2023-03-30 NOTE — Telephone Encounter (Signed)
Spoke with [employer] representative and was made aware that patient daughter in law had the company come and pick up the patient's oxygen equipment without patient's consent. Representative says that the patient would need a new order for Oxygen in order for patient to receive it. Please advise?

## 2023-03-30 NOTE — Telephone Encounter (Signed)
Vanessa Oneal niece is calling back and requesting an update on the oxygen machine. Requesting a callback as soon as possible.  Please advise.

## 2023-03-30 NOTE — Telephone Encounter (Signed)
Order in.

## 2023-03-30 NOTE — Telephone Encounter (Signed)
Spoke with [facility] and informed them the DME and recent clinical office notes were faxed over to fax contact information at [phone]. Patient niece [name] was made aware. Informed [facility] representative says once the DME order and clinical notes were received they would process it with patient's insurance and then if approved they would have it sent to the updated address ([name]'s address) where patient is residing currently. Verbalized understanding.

## 2023-03-30 NOTE — Telephone Encounter (Signed)
Can we call [employer] and get oxygen out

## 2023-03-31 ENCOUNTER — Inpatient Hospital Stay
Admission: EM | Admit: 2023-03-31 | Discharge: 2023-05-05 | DRG: 004 | Disposition: E | Payer: 59 | Attending: Pulmonary Disease | Admitting: Pulmonary Disease

## 2023-03-31 ENCOUNTER — Emergency Department: Payer: 59

## 2023-03-31 ENCOUNTER — Inpatient Hospital Stay: Payer: 59

## 2023-03-31 ENCOUNTER — Encounter: Payer: Self-pay | Admitting: Internal Medicine

## 2023-03-31 ENCOUNTER — Other Ambulatory Visit: Payer: Self-pay

## 2023-03-31 DIAGNOSIS — D630 Anemia in neoplastic disease: Secondary | ICD-10-CM | POA: Diagnosis present

## 2023-03-31 DIAGNOSIS — J90 Pleural effusion, not elsewhere classified: Secondary | ICD-10-CM | POA: Diagnosis present

## 2023-03-31 DIAGNOSIS — C799 Secondary malignant neoplasm of unspecified site: Secondary | ICD-10-CM | POA: Diagnosis not present

## 2023-03-31 DIAGNOSIS — I080 Rheumatic disorders of both mitral and aortic valves: Secondary | ICD-10-CM | POA: Diagnosis present

## 2023-03-31 DIAGNOSIS — E871 Hypo-osmolality and hyponatremia: Secondary | ICD-10-CM | POA: Diagnosis present

## 2023-03-31 DIAGNOSIS — Z4682 Encounter for fitting and adjustment of non-vascular catheter: Secondary | ICD-10-CM | POA: Diagnosis not present

## 2023-03-31 DIAGNOSIS — Z681 Body mass index (BMI) 19 or less, adult: Secondary | ICD-10-CM

## 2023-03-31 DIAGNOSIS — C3490 Malignant neoplasm of unspecified part of unspecified bronchus or lung: Secondary | ICD-10-CM | POA: Diagnosis not present

## 2023-03-31 DIAGNOSIS — J441 Chronic obstructive pulmonary disease with (acute) exacerbation: Principal | ICD-10-CM

## 2023-03-31 DIAGNOSIS — F17210 Nicotine dependence, cigarettes, uncomplicated: Secondary | ICD-10-CM | POA: Diagnosis present

## 2023-03-31 DIAGNOSIS — I2489 Other forms of acute ischemic heart disease: Secondary | ICD-10-CM | POA: Diagnosis present

## 2023-03-31 DIAGNOSIS — Z1152 Encounter for screening for COVID-19: Secondary | ICD-10-CM

## 2023-03-31 DIAGNOSIS — R6521 Severe sepsis with septic shock: Secondary | ICD-10-CM | POA: Diagnosis not present

## 2023-03-31 DIAGNOSIS — Z961 Presence of intraocular lens: Secondary | ICD-10-CM | POA: Diagnosis present

## 2023-03-31 DIAGNOSIS — F1010 Alcohol abuse, uncomplicated: Secondary | ICD-10-CM | POA: Diagnosis present

## 2023-03-31 DIAGNOSIS — A419 Sepsis, unspecified organism: Principal | ICD-10-CM

## 2023-03-31 DIAGNOSIS — I5022 Chronic systolic (congestive) heart failure: Secondary | ICD-10-CM | POA: Diagnosis not present

## 2023-03-31 DIAGNOSIS — M797 Fibromyalgia: Secondary | ICD-10-CM | POA: Diagnosis present

## 2023-03-31 DIAGNOSIS — E43 Unspecified severe protein-calorie malnutrition: Secondary | ICD-10-CM | POA: Diagnosis not present

## 2023-03-31 DIAGNOSIS — I5031 Acute diastolic (congestive) heart failure: Secondary | ICD-10-CM | POA: Diagnosis not present

## 2023-03-31 DIAGNOSIS — J811 Chronic pulmonary edema: Secondary | ICD-10-CM | POA: Diagnosis not present

## 2023-03-31 DIAGNOSIS — Z9842 Cataract extraction status, left eye: Secondary | ICD-10-CM

## 2023-03-31 DIAGNOSIS — Z66 Do not resuscitate: Secondary | ICD-10-CM | POA: Diagnosis not present

## 2023-03-31 DIAGNOSIS — Z825 Family history of asthma and other chronic lower respiratory diseases: Secondary | ICD-10-CM

## 2023-03-31 DIAGNOSIS — Z7189 Other specified counseling: Secondary | ICD-10-CM | POA: Diagnosis not present

## 2023-03-31 DIAGNOSIS — Z4659 Encounter for fitting and adjustment of other gastrointestinal appliance and device: Secondary | ICD-10-CM | POA: Diagnosis not present

## 2023-03-31 DIAGNOSIS — I5084 End stage heart failure: Secondary | ICD-10-CM | POA: Diagnosis present

## 2023-03-31 DIAGNOSIS — R451 Restlessness and agitation: Secondary | ICD-10-CM | POA: Diagnosis not present

## 2023-03-31 DIAGNOSIS — C8593 Non-Hodgkin lymphoma, unspecified, intra-abdominal lymph nodes: Secondary | ICD-10-CM | POA: Diagnosis present

## 2023-03-31 DIAGNOSIS — R739 Hyperglycemia, unspecified: Secondary | ICD-10-CM | POA: Diagnosis not present

## 2023-03-31 DIAGNOSIS — R6520 Severe sepsis without septic shock: Secondary | ICD-10-CM | POA: Diagnosis not present

## 2023-03-31 DIAGNOSIS — J439 Emphysema, unspecified: Secondary | ICD-10-CM | POA: Diagnosis not present

## 2023-03-31 DIAGNOSIS — Z9981 Dependence on supplemental oxygen: Secondary | ICD-10-CM

## 2023-03-31 DIAGNOSIS — I471 Supraventricular tachycardia, unspecified: Secondary | ICD-10-CM | POA: Diagnosis not present

## 2023-03-31 DIAGNOSIS — I700 Atherosclerosis of aorta: Secondary | ICD-10-CM | POA: Diagnosis present

## 2023-03-31 DIAGNOSIS — F32A Depression, unspecified: Secondary | ICD-10-CM | POA: Diagnosis present

## 2023-03-31 DIAGNOSIS — R64 Cachexia: Secondary | ICD-10-CM | POA: Diagnosis present

## 2023-03-31 DIAGNOSIS — R140 Abdominal distension (gaseous): Secondary | ICD-10-CM | POA: Diagnosis not present

## 2023-03-31 DIAGNOSIS — Z515 Encounter for palliative care: Secondary | ICD-10-CM | POA: Diagnosis not present

## 2023-03-31 DIAGNOSIS — J9601 Acute respiratory failure with hypoxia: Secondary | ICD-10-CM

## 2023-03-31 DIAGNOSIS — R54 Age-related physical debility: Secondary | ICD-10-CM | POA: Diagnosis present

## 2023-03-31 DIAGNOSIS — J9600 Acute respiratory failure, unspecified whether with hypoxia or hypercapnia: Secondary | ICD-10-CM | POA: Diagnosis not present

## 2023-03-31 DIAGNOSIS — J440 Chronic obstructive pulmonary disease with acute lower respiratory infection: Secondary | ICD-10-CM | POA: Diagnosis not present

## 2023-03-31 DIAGNOSIS — R61 Generalized hyperhidrosis: Secondary | ICD-10-CM | POA: Diagnosis present

## 2023-03-31 DIAGNOSIS — T380X5A Adverse effect of glucocorticoids and synthetic analogues, initial encounter: Secondary | ICD-10-CM | POA: Diagnosis present

## 2023-03-31 DIAGNOSIS — R0600 Dyspnea, unspecified: Secondary | ICD-10-CM | POA: Diagnosis not present

## 2023-03-31 DIAGNOSIS — R590 Localized enlarged lymph nodes: Secondary | ICD-10-CM | POA: Diagnosis not present

## 2023-03-31 DIAGNOSIS — I2510 Atherosclerotic heart disease of native coronary artery without angina pectoris: Secondary | ICD-10-CM | POA: Diagnosis not present

## 2023-03-31 DIAGNOSIS — F419 Anxiety disorder, unspecified: Secondary | ICD-10-CM | POA: Diagnosis present

## 2023-03-31 DIAGNOSIS — J9612 Chronic respiratory failure with hypercapnia: Secondary | ICD-10-CM | POA: Diagnosis not present

## 2023-03-31 DIAGNOSIS — Z888 Allergy status to other drugs, medicaments and biological substances status: Secondary | ICD-10-CM

## 2023-03-31 DIAGNOSIS — Z431 Encounter for attention to gastrostomy: Secondary | ICD-10-CM | POA: Diagnosis not present

## 2023-03-31 DIAGNOSIS — J189 Pneumonia, unspecified organism: Secondary | ICD-10-CM | POA: Diagnosis present

## 2023-03-31 DIAGNOSIS — R0602 Shortness of breath: Secondary | ICD-10-CM | POA: Diagnosis not present

## 2023-03-31 DIAGNOSIS — R109 Unspecified abdominal pain: Secondary | ICD-10-CM | POA: Diagnosis not present

## 2023-03-31 DIAGNOSIS — J449 Chronic obstructive pulmonary disease, unspecified: Secondary | ICD-10-CM | POA: Diagnosis not present

## 2023-03-31 DIAGNOSIS — M069 Rheumatoid arthritis, unspecified: Secondary | ICD-10-CM | POA: Diagnosis not present

## 2023-03-31 DIAGNOSIS — J9611 Chronic respiratory failure with hypoxia: Secondary | ICD-10-CM | POA: Diagnosis not present

## 2023-03-31 DIAGNOSIS — R1900 Intra-abdominal and pelvic swelling, mass and lump, unspecified site: Secondary | ICD-10-CM | POA: Diagnosis not present

## 2023-03-31 DIAGNOSIS — K219 Gastro-esophageal reflux disease without esophagitis: Secondary | ICD-10-CM | POA: Diagnosis present

## 2023-03-31 DIAGNOSIS — K9423 Gastrostomy malfunction: Secondary | ICD-10-CM | POA: Diagnosis not present

## 2023-03-31 DIAGNOSIS — R000 Tachycardia, unspecified: Secondary | ICD-10-CM | POA: Diagnosis not present

## 2023-03-31 DIAGNOSIS — E875 Hyperkalemia: Secondary | ICD-10-CM | POA: Diagnosis present

## 2023-03-31 DIAGNOSIS — J9690 Respiratory failure, unspecified, unspecified whether with hypoxia or hypercapnia: Secondary | ICD-10-CM | POA: Diagnosis not present

## 2023-03-31 DIAGNOSIS — J9621 Acute and chronic respiratory failure with hypoxia: Secondary | ICD-10-CM | POA: Diagnosis not present

## 2023-03-31 DIAGNOSIS — R0902 Hypoxemia: Secondary | ICD-10-CM | POA: Diagnosis not present

## 2023-03-31 DIAGNOSIS — J9622 Acute and chronic respiratory failure with hypercapnia: Secondary | ICD-10-CM | POA: Diagnosis present

## 2023-03-31 LAB — URINE DRUG SCREEN, QUALITATIVE (ARMC ONLY)
Amphetamines, Ur Screen: NOT DETECTED
Barbiturates, Ur Screen: NOT DETECTED
Benzodiazepine, Ur Scrn: POSITIVE — AB
Cannabinoid 50 Ng, Ur ~~LOC~~: POSITIVE — AB
Cocaine Metabolite,Ur ~~LOC~~: NOT DETECTED
MDMA (Ecstasy)Ur Screen: NOT DETECTED
Methadone Scn, Ur: NOT DETECTED
Opiate, Ur Screen: POSITIVE — AB
Phencyclidine (PCP) Ur S: NOT DETECTED
Tricyclic, Ur Screen: POSITIVE — AB

## 2023-03-31 LAB — COMPREHENSIVE METABOLIC PANEL
ALT: 12 U/L (ref 0–44)
AST: 34 U/L (ref 15–41)
Albumin: 2.5 g/dL — ABNORMAL LOW (ref 3.5–5.0)
Alkaline Phosphatase: 66 U/L (ref 38–126)
Anion gap: 9 (ref 5–15)
BUN: 20 mg/dL (ref 8–23)
CO2: 28 mmol/L (ref 22–32)
Calcium: 8.2 mg/dL — ABNORMAL LOW (ref 8.9–10.3)
Chloride: 93 mmol/L — ABNORMAL LOW (ref 98–111)
Creatinine, Ser: 0.5 mg/dL (ref 0.44–1.00)
GFR, Estimated: >60 mL/min (ref >60)
Glucose, Bld: 150 mg/dL — ABNORMAL HIGH (ref 70–99)
Potassium: 4 mmol/L (ref 3.5–5.1)
Sodium: 130 mmol/L — ABNORMAL LOW (ref 135–145)
Total Bilirubin: 0.5 mg/dL (ref 0.3–1.2)
Total Protein: 6.7 g/dL (ref 6.5–8.1)

## 2023-03-31 LAB — MAGNESIUM
Magnesium: 1.5 mg/dL — ABNORMAL LOW (ref 1.7–2.4)
Magnesium: 2 mg/dL (ref 1.7–2.4)

## 2023-03-31 LAB — BLOOD GAS, ARTERIAL
Acid-Base Excess: 8 mmol/L — ABNORMAL HIGH (ref 0.0–2.0)
Bicarbonate: 34.7 mmol/L — ABNORMAL HIGH (ref 20.0–28.0)
FIO2: 50 %
MECHVT: 350 mL
Mechanical Rate: 18
O2 Saturation: 97.9 %
PEEP: 5 cmH2O
Patient temperature: 37
pCO2 arterial: 56 mmHg — ABNORMAL HIGH (ref 32–48)
pH, Arterial: 7.4 (ref 7.35–7.45)
pO2, Arterial: 82 mmHg — ABNORMAL LOW (ref 83–108)

## 2023-03-31 LAB — TROPONIN I (HIGH SENSITIVITY)
Troponin I (High Sensitivity): 81 ng/L — ABNORMAL HIGH (ref <18)
Troponin I (High Sensitivity): 698 ng/L (ref <18)
Troponin I (High Sensitivity): 808 ng/L (ref <18)
Troponin I (High Sensitivity): 871 ng/L (ref <18)
Troponin I (High Sensitivity): 592 ng/L (ref <18)
Troponin I (High Sensitivity): 694 ng/L (ref <18)
Troponin I (High Sensitivity): 650 ng/L (ref <18)
Troponin I (High Sensitivity): 628 ng/L (ref <18)

## 2023-03-31 LAB — CBC WITH DIFFERENTIAL/PLATELET
Abs Immature Granulocytes: 0.03 10*3/uL (ref 0.00–0.07)
Basophils Absolute: 0 10*3/uL (ref 0.0–0.1)
Basophils Relative: 0 %
Eosinophils Absolute: 0.1 10*3/uL (ref 0.0–0.5)
Eosinophils Relative: 1 %
HCT: 30.5 % — ABNORMAL LOW (ref 36.0–46.0)
Hemoglobin: 10 g/dL — ABNORMAL LOW (ref 12.0–15.0)
Immature Granulocytes: 0 %
Lymphocytes Relative: 13 %
Lymphs Abs: 1.3 10*3/uL (ref 0.7–4.0)
MCH: 31.3 pg (ref 26.0–34.0)
MCHC: 32.8 g/dL (ref 30.0–36.0)
MCV: 95.3 fL (ref 80.0–100.0)
Monocytes Absolute: 1 10*3/uL (ref 0.1–1.0)
Monocytes Relative: 10 %
Neutro Abs: 7.6 10*3/uL (ref 1.7–7.7)
Neutrophils Relative %: 76 %
Platelets: 276 10*3/uL (ref 150–400)
RBC: 3.2 MIL/uL — ABNORMAL LOW (ref 3.87–5.11)
RDW: 12.9 % (ref 11.5–15.5)
WBC: 10.1 10*3/uL (ref 4.0–10.5)
nRBC: 0 % (ref 0.0–0.2)

## 2023-03-31 LAB — BODY FLUID CELL COUNT WITH DIFFERENTIAL
Eos, Fluid: 1 %
Lymphs, Fluid: 3 %
Monocyte-Macrophage-Serous Fluid: 12 % — ABNORMAL LOW (ref 50–90)
Neutrophil Count, Fluid: 84 % — ABNORMAL HIGH (ref 0–25)
Total Nucleated Cell Count, Fluid: UNDETERMINED cu mm (ref 0–1000)

## 2023-03-31 LAB — RESPIRATORY PANEL BY PCR

## 2023-03-31 LAB — URINALYSIS, COMPLETE (UACMP) WITH MICROSCOPIC
Bilirubin Urine: NEGATIVE
Glucose, UA: NEGATIVE mg/dL
Hgb urine dipstick: NEGATIVE
Ketones, ur: NEGATIVE mg/dL
Leukocytes,Ua: NEGATIVE
Nitrite: NEGATIVE
Protein, ur: 30 mg/dL — AB
Specific Gravity, Urine: 1.023 (ref 1.005–1.030)
Squamous Epithelial / HPF: NONE SEEN /HPF (ref 0–5)
pH: 8 (ref 5.0–8.0)

## 2023-03-31 LAB — RESP PANEL BY RT-PCR (RSV, FLU A&B, COVID)  RVPGX2
Influenza A by PCR: NEGATIVE
Influenza B by PCR: NEGATIVE
Resp Syncytial Virus by PCR: NEGATIVE
SARS Coronavirus 2 by RT PCR: NEGATIVE

## 2023-03-31 LAB — GLUCOSE, CAPILLARY
Glucose-Capillary: 188 mg/dL — ABNORMAL HIGH (ref 70–99)
Glucose-Capillary: 146 mg/dL — ABNORMAL HIGH (ref 70–99)
Glucose-Capillary: 161 mg/dL — ABNORMAL HIGH (ref 70–99)
Glucose-Capillary: 185 mg/dL — ABNORMAL HIGH (ref 70–99)
Glucose-Capillary: 144 mg/dL — ABNORMAL HIGH (ref 70–99)

## 2023-03-31 LAB — CULTURE, BLOOD (ROUTINE X 2)

## 2023-03-31 LAB — PROTIME-INR
INR: 1 (ref 0.8–1.2)
Prothrombin Time: 13.5 seconds (ref 11.4–15.2)

## 2023-03-31 LAB — LACTIC ACID, PLASMA
Lactic Acid, Venous: 2.3 mmol/L (ref 0.5–1.9)
Lactic Acid, Venous: 1.6 mmol/L (ref 0.5–1.9)

## 2023-03-31 LAB — MRSA NEXT GEN BY PCR, NASAL: MRSA by PCR Next Gen: NOT DETECTED

## 2023-03-31 LAB — STREP PNEUMONIAE URINARY ANTIGEN: Strep Pneumo Urinary Antigen: NEGATIVE

## 2023-03-31 LAB — BRAIN NATRIURETIC PEPTIDE: B Natriuretic Peptide: 418.2 pg/mL — ABNORMAL HIGH (ref 0.0–100.0)

## 2023-03-31 LAB — PROCALCITONIN: Procalcitonin: 0.32 ng/mL

## 2023-03-31 LAB — D-DIMER, QUANTITATIVE: D-Dimer, Quant: 1.13 ug/mL-FEU — ABNORMAL HIGH (ref 0.00–0.50)

## 2023-03-31 MED ORDER — ETOMIDATE 2 MG/ML IV SOLN
INTRAVENOUS | Status: AC
  Administered 2023-03-31: 20 mg
  Filled 2023-03-31: qty 10

## 2023-03-31 MED ORDER — FENTANYL 2500MCG IN NS 250ML (10MCG/ML) PREMIX INFUSION: 0.0000 ug/h | INTRAVENOUS | Status: DC

## 2023-03-31 MED ORDER — PROPOFOL 1000 MG/100ML IV EMUL
5.0000 ug/kg/min | INTRAVENOUS | Status: DC
  Administered 2023-03-31: 34.979 ug/kg/min via INTRAVENOUS
  Filled 2023-03-31: qty 100

## 2023-03-31 MED ORDER — FAMOTIDINE 20 MG PO TABS
20.0000 mg | ORAL_TABLET | Freq: Two times a day (BID) | ORAL | Status: DC
  Administered 2023-03-31 – 2023-04-20 (×40): 20 mg
  Filled 2023-03-31 (×40): qty 1

## 2023-03-31 MED ORDER — ROCURONIUM BROMIDE 10 MG/ML (PF) SYRINGE
50.0000 mg | PREFILLED_SYRINGE | Freq: Once | INTRAVENOUS | Status: AC
  Administered 2023-03-31: 50 mg via INTRAVENOUS

## 2023-03-31 MED ORDER — FENTANYL 2500MCG IN NS 250ML (10MCG/ML) PREMIX INFUSION
25.0000 ug/h | INTRAVENOUS | Status: DC
  Administered 2023-03-31 – 2023-04-02 (×2): 25 ug/h via INTRAVENOUS
  Administered 2023-04-03: 75 ug/h via INTRAVENOUS
  Administered 2023-04-04: 100 ug/h via INTRAVENOUS
  Administered 2023-04-05: 80 ug/h via INTRAVENOUS
  Administered 2023-04-06 – 2023-04-08 (×3): 100 ug/h via INTRAVENOUS
  Administered 2023-04-09 – 2023-04-10 (×4): 150 ug/h via INTRAVENOUS
  Filled 2023-03-31 (×12): qty 250

## 2023-03-31 MED ORDER — HEPARIN SODIUM (PORCINE) 5000 UNIT/ML IJ SOLN
5000.0000 [IU] | Freq: Three times a day (TID) | INTRAMUSCULAR | Status: DC
  Administered 2023-03-31 – 2023-04-03 (×11): 5000 [IU] via SUBCUTANEOUS
  Filled 2023-03-31 (×11): qty 1

## 2023-03-31 MED ORDER — METHYLPREDNISOLONE SODIUM SUCC 40 MG IJ SOLR
40.0000 mg | Freq: Every day | INTRAMUSCULAR | Status: DC
  Administered 2023-03-31: 40 mg via INTRAVENOUS
  Filled 2023-03-31: qty 1

## 2023-03-31 MED ORDER — PROPOFOL 1000 MG/100ML IV EMUL
INTRAVENOUS | Status: AC
  Administered 2023-03-31: 5 ug/kg/min via INTRAVENOUS
  Filled 2023-03-31: qty 100

## 2023-03-31 MED ORDER — CHLORHEXIDINE GLUCONATE CLOTH 2 % EX PADS
6.0000 | MEDICATED_PAD | Freq: Every day | CUTANEOUS | Status: DC
  Administered 2023-03-31 – 2023-04-02 (×2): 6 via TOPICAL

## 2023-03-31 MED ORDER — BUDESONIDE 0.25 MG/2ML IN SUSP
0.2500 mg | Freq: Two times a day (BID) | RESPIRATORY_TRACT | Status: DC
  Administered 2023-03-31 – 2023-04-01 (×3): 0.25 mg via RESPIRATORY_TRACT
  Filled 2023-03-31 (×3): qty 2

## 2023-03-31 MED ORDER — CLONAZEPAM 0.5 MG PO TABS
0.5000 mg | ORAL_TABLET | Freq: Two times a day (BID) | ORAL | Status: DC
  Administered 2023-03-31 (×2): 0.5 mg via ORAL
  Filled 2023-03-31 (×2): qty 1

## 2023-03-31 MED ORDER — SODIUM BICARBONATE 8.4 % IV SOLN
INTRAVENOUS | Status: AC
  Filled 2023-03-31: qty 50

## 2023-03-31 MED ORDER — MIDAZOLAM HCL 2 MG/2ML IJ SOLN
2.0000 mg | Freq: Once | INTRAMUSCULAR | Status: AC
  Administered 2023-03-31: 2 mg via INTRAVENOUS

## 2023-03-31 MED ORDER — LORAZEPAM 2 MG/ML IJ SOLN
1.0000 mg | Freq: Once | INTRAMUSCULAR | Status: AC
  Administered 2023-03-31: 1 mg via INTRAVENOUS
  Filled 2023-03-31: qty 1

## 2023-03-31 MED ORDER — SODIUM CHLORIDE 0.9 % IV BOLUS (SEPSIS)
1000.0000 mL | Freq: Once | INTRAVENOUS | Status: AC
  Administered 2023-03-31: 1000 mL via INTRAVENOUS

## 2023-03-31 MED ORDER — POLYETHYLENE GLYCOL 3350 17 G PO PACK
17.0000 g | PACK | Freq: Every day | ORAL | Status: DC
  Administered 2023-04-01 – 2023-04-09 (×8): 17 g
  Filled 2023-03-31 (×7): qty 1

## 2023-03-31 MED ORDER — NOREPINEPHRINE 4 MG/250ML-% IV SOLN
2.0000 ug/min | INTRAVENOUS | Status: DC
  Administered 2023-03-31 – 2023-04-04 (×3): 2 ug/min via INTRAVENOUS
  Filled 2023-03-31 (×3): qty 250

## 2023-03-31 MED ORDER — FENTANYL BOLUS VIA INFUSION
50.0000 ug | Freq: Once | INTRAVENOUS | Status: AC
  Administered 2023-03-31: 50 ug via INTRAVENOUS

## 2023-03-31 MED ORDER — SODIUM CHLORIDE 0.9 % IV SOLN
2.0000 g | Freq: Two times a day (BID) | INTRAVENOUS | Status: DC
  Administered 2023-03-31 – 2023-04-04 (×8): 2 g via INTRAVENOUS
  Filled 2023-03-31 (×2): qty 2
  Filled 2023-03-31: qty 12.5
  Filled 2023-03-31: qty 2
  Filled 2023-03-31 (×2): qty 12.5
  Filled 2023-03-31: qty 2
  Filled 2023-03-31: qty 12.5

## 2023-03-31 MED ORDER — VANCOMYCIN HCL IN DEXTROSE 1-5 GM/200ML-% IV SOLN
1000.0000 mg | Freq: Once | INTRAVENOUS | Status: AC
  Administered 2023-03-31: 1000 mg via INTRAVENOUS
  Filled 2023-03-31: qty 200

## 2023-03-31 MED ORDER — ETOMIDATE 2 MG/ML IV SOLN
INTRAVENOUS | Status: AC
  Filled 2023-03-31: qty 10

## 2023-03-31 MED ORDER — DOCUSATE SODIUM 100 MG PO CAPS: 100.0000 mg | ORAL_CAPSULE | Freq: Two times a day (BID) | ORAL | Status: DC | PRN

## 2023-03-31 MED ORDER — SODIUM CHLORIDE 0.9 % IV SOLN
250.0000 mL | INTRAVENOUS | Status: DC
  Administered 2023-04-01 – 2023-04-07 (×2): 250 mL via INTRAVENOUS

## 2023-03-31 MED ORDER — ETOMIDATE 2 MG/ML IV SOLN
10.0000 mg | Freq: Once | INTRAVENOUS | Status: AC
  Administered 2023-03-31: 10 mg via INTRAVENOUS

## 2023-03-31 MED ORDER — DEXMEDETOMIDINE HCL IN NACL 400 MCG/100ML IV SOLN
0.0000 ug/kg/h | INTRAVENOUS | Status: DC
  Administered 2023-04-01: 0.9 ug/kg/h via INTRAVENOUS
  Administered 2023-04-01 – 2023-04-02 (×2): 1.2 ug/kg/h via INTRAVENOUS
  Administered 2023-04-02: 1 ug/kg/h via INTRAVENOUS
  Administered 2023-04-02: 1.2 ug/kg/h via INTRAVENOUS
  Filled 2023-03-31 (×5): qty 100

## 2023-03-31 MED ORDER — SODIUM CHLORIDE 0.9 % IV BOLUS (SEPSIS)
250.0000 mL | Freq: Once | INTRAVENOUS | Status: AC
  Administered 2023-03-31: 250 mL via INTRAVENOUS

## 2023-03-31 MED ORDER — FENTANYL 2500MCG IN NS 250ML (10MCG/ML) PREMIX INFUSION
INTRAVENOUS | Status: AC
  Administered 2023-03-31: 50 ug/h via INTRAVENOUS
  Filled 2023-03-31: qty 250

## 2023-03-31 MED ORDER — MIDAZOLAM HCL 2 MG/2ML IJ SOLN
INTRAMUSCULAR | Status: AC
  Filled 2023-03-31: qty 2

## 2023-03-31 MED ORDER — IPRATROPIUM-ALBUTEROL 0.5-2.5 (3) MG/3ML IN SOLN: 3.0000 mL | Freq: Four times a day (QID) | RESPIRATORY_TRACT | Status: DC | PRN

## 2023-03-31 MED ORDER — PROPOFOL 10 MG/ML IV BOLUS
25.0000 mg | Freq: Once | INTRAVENOUS | Status: AC
  Administered 2023-03-31: 25 mg via INTRAVENOUS

## 2023-03-31 MED ORDER — DIAZEPAM 5 MG/ML IJ SOLN
2.5000 mg | Freq: Once | INTRAMUSCULAR | Status: AC
  Administered 2023-03-31: 2.5 mg via INTRAVENOUS

## 2023-03-31 MED ORDER — FENTANYL BOLUS VIA INFUSION: 25.0000 ug | INTRAVENOUS | Status: DC | PRN

## 2023-03-31 MED ORDER — FENTANYL BOLUS VIA INFUSION
25.0000 ug | INTRAVENOUS | Status: DC | PRN
  Administered 2023-04-01 – 2023-04-02 (×2): 25 ug via INTRAVENOUS
  Administered 2023-04-03: 100 ug via INTRAVENOUS
  Administered 2023-04-03: 50 ug via INTRAVENOUS
  Administered 2023-04-04 (×2): 100 ug via INTRAVENOUS
  Administered 2023-04-05 – 2023-04-06 (×2): 50 ug via INTRAVENOUS
  Administered 2023-04-06: 100 ug via INTRAVENOUS
  Administered 2023-04-07 (×2): 50 ug via INTRAVENOUS
  Administered 2023-04-09: 100 ug via INTRAVENOUS

## 2023-03-31 MED ORDER — SODIUM CHLORIDE 0.9 % IV SOLN
2.0000 g | Freq: Once | INTRAVENOUS | Status: AC
  Administered 2023-03-31: 2 g via INTRAVENOUS
  Filled 2023-03-31: qty 12.5

## 2023-03-31 MED ORDER — IOHEXOL 300 MG/ML  SOLN
100.0000 mL | Freq: Once | INTRAMUSCULAR | Status: AC | PRN
  Administered 2023-03-31: 100 mL via INTRAVENOUS

## 2023-03-31 MED ORDER — ORAL CARE MOUTH RINSE: 15.0000 mL | OROMUCOSAL | Status: DC | PRN

## 2023-03-31 MED ORDER — LORAZEPAM 2 MG/ML PO CONC: 1.0000 mg | Freq: Once | ORAL | Status: DC

## 2023-03-31 MED ORDER — MIDAZOLAM HCL 2 MG/2ML IJ SOLN
2.0000 mg | INTRAMUSCULAR | Status: DC | PRN
  Administered 2023-03-31 – 2023-04-12 (×12): 2 mg via INTRAVENOUS
  Filled 2023-03-31 (×12): qty 2

## 2023-03-31 MED ORDER — MIDAZOLAM HCL 2 MG/2ML IJ SOLN
1.0000 mg | INTRAMUSCULAR | Status: DC | PRN
  Administered 2023-03-31: 2 mg via INTRAVENOUS
  Filled 2023-03-31: qty 2

## 2023-03-31 MED ORDER — DOCUSATE SODIUM 50 MG/5ML PO LIQD
100.0000 mg | Freq: Two times a day (BID) | ORAL | Status: DC
  Administered 2023-03-31 – 2023-04-16 (×27): 100 mg
  Filled 2023-03-31 (×27): qty 10

## 2023-03-31 MED ORDER — ROCURONIUM BROMIDE 10 MG/ML (PF) SYRINGE
PREFILLED_SYRINGE | INTRAVENOUS | Status: AC
  Filled 2023-03-31: qty 10

## 2023-03-31 MED ORDER — SODIUM CHLORIDE 0.9 % IV BOLUS
1000.0000 mL | Freq: Once | INTRAVENOUS | Status: AC
  Administered 2023-03-31: 1000 mL via INTRAVENOUS

## 2023-03-31 MED ORDER — IPRATROPIUM-ALBUTEROL 0.5-2.5 (3) MG/3ML IN SOLN
3.0000 mL | Freq: Four times a day (QID) | RESPIRATORY_TRACT | Status: DC
  Administered 2023-03-31 – 2023-04-07 (×30): 3 mL via RESPIRATORY_TRACT
  Filled 2023-03-31 (×30): qty 3

## 2023-03-31 MED ORDER — DIAZEPAM 5 MG/ML IJ SOLN
INTRAMUSCULAR | Status: AC
  Filled 2023-03-31: qty 2

## 2023-03-31 MED ORDER — DEXMEDETOMIDINE HCL IN NACL 400 MCG/100ML IV SOLN
INTRAVENOUS | Status: AC
  Administered 2023-03-31: 0.4 ug/kg/h via INTRAVENOUS
  Filled 2023-03-31: qty 100

## 2023-03-31 MED ORDER — POLYETHYLENE GLYCOL 3350 17 G PO PACK
17.0000 g | PACK | Freq: Every day | ORAL | Status: DC | PRN
  Administered 2023-04-04: 17 g via ORAL
  Filled 2023-03-31: qty 1

## 2023-03-31 MED ORDER — DIAZEPAM 5 MG/ML IJ SOLN
2.5000 mg | Freq: Four times a day (QID) | INTRAMUSCULAR | Status: DC | PRN
  Administered 2023-03-31: 2.5 mg via INTRAVENOUS
  Filled 2023-03-31: qty 2

## 2023-03-31 MED ORDER — VANCOMYCIN HCL 500 MG/100ML IV SOLN
500.0000 mg | INTRAVENOUS | Status: DC
  Administered 2023-04-01: 500 mg via INTRAVENOUS
  Filled 2023-03-31: qty 100

## 2023-03-31 MED ORDER — ORAL CARE MOUTH RINSE
15.0000 mL | OROMUCOSAL | Status: DC
  Administered 2023-03-31 – 2023-04-20 (×229): 15 mL via OROMUCOSAL

## 2023-03-31 MED ORDER — NICOTINE 21 MG/24HR TD PT24
21.0000 mg | MEDICATED_PATCH | Freq: Every day | TRANSDERMAL | Status: DC
  Administered 2023-03-31 – 2023-04-20 (×18): 21 mg via TRANSDERMAL
  Filled 2023-03-31 (×18): qty 1

## 2023-03-31 MED ORDER — MAGNESIUM SULFATE 2 GM/50ML IV SOLN
2.0000 g | Freq: Once | INTRAVENOUS | Status: AC
  Administered 2023-03-31: 2 g via INTRAVENOUS
  Filled 2023-03-31: qty 50

## 2023-03-31 MED ORDER — SUCCINYLCHOLINE CHLORIDE 200 MG/10ML IV SOSY
PREFILLED_SYRINGE | INTRAVENOUS | Status: AC
  Administered 2023-03-31: 120 mg
  Filled 2023-03-31: qty 10

## 2023-03-31 NOTE — Progress Notes (Signed)
eLink Physician-Brief Progress Note Patient Name: Vanessa Oneal DOB: 11-28-1951 MRN: [id number]   Date of Service  03/31/2023  HPI/Events of Note  72 yr old female with COPD and mediastinal, upper abd, and retroperitoneal adenopathy for which she was seen in oncology clinic 4/25 presented with resp failure.  CT chest with emphysema, bilat effusions, septal thickening, RML infiltrate and other infiltrates bilat.  Now intubated.  Received broad spectrum abx in ED.  Requiring low dose vasopressors.  Admission to ICU being completed.  eICU Interventions  Chart reviewed     Intervention Category Evaluation Type: New Patient Evaluation  Henry Russel, P 03/31/2023, 5:24 AM

## 2023-03-31 NOTE — Progress Notes (Signed)
Brief Interventional Radiology Note:  Request received for R supraclavicular lymph node biopsy. Dr. Archer Asa reviewed imaging and approved biopsy. Pt is currently in intubated in ICU. She will need to be medically stable, extubated and out of ICU before biopsy can be performed.       Alex Gardener, AGNP-BC 03/31/2023, 8:58 AM

## 2023-03-31 NOTE — Progress Notes (Signed)
CODE SEPSIS - PHARMACY COMMUNICATION  **Broad Spectrum Antibiotics should be administered within 1 hour of Sepsis diagnosis**  Time Code Sepsis Called/Page Received: 4/27 @ 0048   Antibiotics Ordered: Vanc and Cefepime   Time of 1st antibiotic administration: Cefepime 2 gm IV X 1 on 4/27 @ 0115   Additional action taken by pharmacy:   If necessary, Name of Provider/Nurse Contacted:     Ihor Meinzer D ,PharmD Clinical Pharmacist  03/31/2023  1:37 AM

## 2023-03-31 NOTE — Plan of Care (Signed)
Discussed with patient prior to intubation plan of care for the shift, pain management and sedation medications with some teach back displayed.  Patient has safety mitts and bed alarm on with sedation and pain medication drips going at this time.  Talked to sons and gave them direct number to call for updates on moms status tonight.  Main goal is to protect airway, give her rest and anti-anxiety medications prior to extubation.  Problem: Education: Goal: Knowledge of General Education information will improve Description: Including pain rating scale, medication(s)/side effects and non-pharmacologic comfort measures Outcome: Progressing   Problem: Health Behavior/Discharge Planning: Goal: Ability to manage health-related needs will improve Outcome: Not Progressing   Problem: Coping: Goal: Level of anxiety will decrease Outcome: Not Progressing   Problem: Pain Managment: Goal: General experience of comfort will improve Outcome: Progressing

## 2023-03-31 NOTE — Progress Notes (Signed)
Family members/friends x4 in room crowded around patient bed, excited due to IV coming out of left arm. Charge nurse rushed into room. Patient very excited at this point stating she can't get a breath. Dr. Jeanene Erb to room. Decision to re-intubate made. Family/friends sent to waiting room during procedure.

## 2023-03-31 NOTE — Consult Note (Signed)
PHARMACY CONSULT NOTE - FOLLOW UP  Pharmacy Consult for Electrolyte Monitoring and Replacement   Recent Labs: Potassium (mmol/L)  Date Value  03/31/2023 4.0  01/03/2014 3.6   Magnesium (mg/dL)  Date Value  16/09/9603 1.5 (L)  01/03/2014 1.9   Calcium (mg/dL)  Date Value  54/08/8118 8.2 (L)   Calcium, Total (mg/dL)  Date Value  14/78/2956 7.4 (L)   Albumin (g/dL)  Date Value  21/30/8657 2.5 (L)  07/31/2022 2.9 (L)  01/01/2014 3.2 (L)   Phosphorus (mg/dL)  Date Value  84/69/6295 3.8   Sodium (mmol/L)  Date Value  03/31/2023 130 (L)  07/31/2022 137  01/03/2014 139     Assessment: 72 y.o female w/ significant PMH of COPD, Emphysema, GERD, RA, Fibromyalgia, tobacco abuse, h/o drug abuse (cocaine). EtOH Abuse, Anxiety and Depression and recent diagnosis of chest wall mass concerning for Lymphadenopathy who presented to the ED with chief complaints of acute respiratory distress. Patient currently intubated and transferred to ICU. Pharmacy has been consulted to manage and replace electrolytes while under PCCM care.  Goal of Therapy:  Electrolytes WNL  Plan:  Mag sulfate 2g IV x 1 Will recheck electrolytes tomorrow with AM labs  Bettey Costa ,PharmD Clinical Pharmacist 03/31/2023 8:54 AM

## 2023-03-31 NOTE — Progress Notes (Signed)
Pharmacy Antibiotic Note  Vanessa Oneal is a 72 y.o. female admitted on 03/31/2023 with acute on chronic respiratory failure and sepsis secondary to multifocal pneumonia. Pharmacy has been consulted for vancomycin and cefepime dosing. Patient received cefepime 2mg  and vancomycin 1g loading dose in ED  Plan: Start Cefepime 2g IV every 12 hours based on CrCl <76ml/min  Start Vancomycin 500 mg IV Q 24 hrs. Goal AUC 400-550. Expected AUC: 443 SCr used: 0.8 Css Min: 11.4   Pharmacy will continue to monitor and adjust doses as needed. Plan to order vancomycin levels as clinical indicated.   Weight: 40.5 kg (89 lb 3.2 oz)  Temp (24hrs), Avg:98.4 F (36.9 C), Min:97.2 F (36.2 C), Max:99.6 F (37.6 C)  Recent Labs  Lab 03/29/23 1553 03/31/23 0046 03/31/23 0219  WBC 7.5 10.1  --   CREATININE 0.65 0.50  --   LATICACIDVEN  --  2.3* 1.6    Estimated Creatinine Clearance: 41.2 mL/min (by C-G formula based on SCr of 0.5 mg/dL).    Allergies  Allergen Reactions   Gabapentin Anaphylaxis    Antimicrobials this admission: 4/27 Vancomycin >>  4/27 cefepime >>    Microbiology results: 4/27 BCx: pending 4/27 Respiratory PCR Panel: pending 4/27 MRSA PCR: Negative  Thank you for allowing pharmacy to be a part of this patient's care.  Gardner Candle, PharmD, BCPS Clinical Pharmacist 03/31/2023 6:49 AM

## 2023-03-31 NOTE — Progress Notes (Signed)
Elink monitoring for the code sepsis protocol.  

## 2023-03-31 NOTE — Progress Notes (Signed)
An USGPIV (ultrasound guided PIV) has been placed for short-term vasopressor infusion. A correctly placed ivWatch must be used when administering Vasopressors. Should this treatment be needed beyond 72 hours, central line access should be obtained.  It will be the responsibility of the bedside nurse to follow best practice to prevent extravasations.   

## 2023-03-31 NOTE — ED Provider Notes (Signed)
[facility] Provider Note    Event Date/Time   First MD Initiated Contact with Patient 03/31/23 [phone]     (approximate)   History   Respiratory Distress   HPI  Level V caveat: Limited by distress  Vanessa Oneal is a 72 y.o. female brought to the ED via EMS from home with a chief complaint of respiratory distress.  Patient with a history of COPD on chronic oxygen, recently diagnosed with a tumor in her chest.  Complains of shortness of breath all day.  EMS reports saturations in the mid 60% upon their arrival.  EMS gave 125 mg IV Solu-Medrol and 2 albuterol nebulizers prior to arrival.  Patient complains of wet sounding cough and chest tightness.  Denies fever, abdominal pain, nausea or vomiting.     Past Medical History   Past Medical History:  Diagnosis Date   Arthritis    RA   Depression    Fibromyalgia    GERD (gastroesophageal reflux disease)    H/O drug dependence (HCC)    opiods   Wears dentures    full upper     Active Problem List   Patient Active Problem List   Diagnosis Date Noted   Acute on chronic respiratory failure with hypoxia and hypercapnia (HCC) 03/31/2023   Oxygen dependent 03/29/2023   Hypoxia 03/29/2023   Mediastinal mass 03/29/2023   Lymphadenopathy 03/29/2023   Hyponatremia 03/29/2023   Protein-calorie malnutrition, severe 07/19/2022   CAP (community acquired pneumonia) 07/18/2022   Compression fracture of third lumbar vertebra (HCC) 11/15/2021   Benzodiazepine abuse (HCC) 06/06/2021   Alcohol abuse 06/06/2021   Aortic atherosclerosis (HCC) 01/07/2021   CAD (coronary artery disease) 02/28/2018   Centrilobular emphysema (HCC) 02/28/2018   Chronic hepatitis C without hepatic coma (HCC) 11/22/2017   Opiate abuse, continuous (HCC) 11/19/2017   Anxiety disorder 11/19/2017   Osteoarthritis 11/19/2017   Vitamin D deficiency, unspecified 05/30/2016   Malnutrition (HCC) 05/22/2016     Past Surgical History    Past Surgical History:  Procedure Laterality Date   BREAST SURGERY     Silicone implants, then removal   CATARACT EXTRACTION W/PHACO Left 05/06/2019   Procedure: CATARACT EXTRACTION PHACO AND INTRAOCULAR LENS PLACEMENT (IOC) LEFT;  Surgeon: Galen Manila, MD;  Location: [facility];  Service: Ophthalmology;  Laterality: Left;   TUBAL LIGATION       Home Medications   Prior to Admission medications   Medication Sig Start Date End Date Taking? Authorizing Provider  albuterol (VENTOLIN HFA) 108 (90 Base) MCG/ACT inhaler Inhale 2 puffs into the lungs every 6 (six) hours as needed for wheezing or shortness of breath. Patient not taking: Reported on 03/29/2023 07/23/22   Kathrynn Running, MD  clonazePAM (KLONOPIN) 0.5 MG tablet Take by mouth.    [provider]  Fluticasone-Umeclidin-Vilant (TRELEGY ELLIPTA) 100-62.5-25 MCG/ACT AEPB Inhale 1 Inhalation into the lungs daily in the afternoon. Patient not taking: Reported on 03/29/2023 07/31/22   Olevia Perches P, DO  naloxone [facility]) nasal spray 4 mg/0.1 mL 1 spray into the nostril 1x, may repeat dose in alternate nostrils every 2-3 minutes until EMS arrives 03/29/23   Olevia Perches P, DO  oxyCODONE-acetaminophen (PERCOCET) 10-325 MG tablet Take 1 tablet by mouth every 6 (six) hours as needed for up to 5 days for pain. 03/29/23 04/03/23  Johnson, Megan P, DO  promethazine (PHENERGAN) 25 MG tablet TAKE 1/2 TABLET BY MOUTH EVERY 8 HOURS AS NEEDED FOR NAUSEA AND VOMITING 07/31/22  Johnson, Megan P, DO     Allergies  Gabapentin   Family History   Family History  Problem Relation Age of Onset   Suicidality Father    COPD Sister      Physical Exam  Triage Vital Signs: ED Triage Vitals  Enc Vitals Group     BP      Pulse      Resp      Temp      Temp src      SpO2      Weight      Height      Head Circumference      Peak Flow      Pain Score      Pain Loc      Pain Edu?      Excl. in GC?     Updated  Vital Signs: BP 116/86   Pulse (!) 103   Temp 97.7 F (36.5 C)   Resp 19   Wt 40.5 kg   SpO2 93%   BMI 16.85 kg/m    General: Awake, severe distress.  Fatigued. CV:  Tachycardiac.  Good peripheral perfusion.  Resp:  Increased effort.  Guppy breathing.  Diminished aeration bilaterally. Abd:  Nontender.  No distention.  Other:  No pedal edema.   ED Results / Procedures / Treatments  Labs (all labs ordered are listed, but only abnormal results are displayed) Labs Reviewed  LACTIC ACID, PLASMA - Abnormal; Notable for the following components:      Result Value   Lactic Acid, Venous 2.3 (*)    All other components within normal limits  CBC WITH DIFFERENTIAL/PLATELET - Abnormal; Notable for the following components:   RBC 3.20 (*)    Hemoglobin 10.0 (*)    HCT 30.5 (*)    All other components within normal limits  COMPREHENSIVE METABOLIC PANEL - Abnormal; Notable for the following components:   Sodium 130 (*)    Chloride 93 (*)    Glucose, Bld 150 (*)    Calcium 8.2 (*)    Albumin 2.5 (*)    All other components within normal limits  BRAIN NATRIURETIC PEPTIDE - Abnormal; Notable for the following components:   B Natriuretic Peptide 418.2 (*)    All other components within normal limits  BLOOD GAS, ARTERIAL - Abnormal; Notable for the following components:   pCO2 arterial 56 (*)    pO2, Arterial 82 (*)    Bicarbonate 34.7 (*)    Acid-Base Excess 8.0 (*)    All other components within normal limits  MAGNESIUM - Abnormal; Notable for the following components:   Magnesium 1.5 (*)    All other components within normal limits  URINALYSIS, COMPLETE (UACMP) WITH MICROSCOPIC - Abnormal; Notable for the following components:   Color, Urine YELLOW (*)    APPearance CLOUDY (*)    Protein, ur 30 (*)    Bacteria, UA RARE (*)    All other components within normal limits  URINE DRUG SCREEN, QUALITATIVE ([facility] ONLY) - Abnormal; Notable for the following components:   Tricyclic, Ur  Screen POSITIVE (*)    Opiate, Ur Screen POSITIVE (*)    Cannabinoid 50 Ng, Ur [place] POSITIVE (*)    Benzodiazepine, Ur Scrn POSITIVE (*)    All other components within normal limits  GLUCOSE, CAPILLARY - Abnormal; Notable for the following components:   Glucose-Capillary 188 (*)    All other components within normal limits  TROPONIN I (HIGH SENSITIVITY) - Abnormal; Notable  for the following components:   Troponin I (High Sensitivity) 81 (*)    All other components within normal limits  RESP PANEL BY RT-PCR (RSV, FLU A&B, COVID)  RVPGX2  CULTURE, BLOOD (ROUTINE X 2)  CULTURE, BLOOD (ROUTINE X 2)  RESPIRATORY PANEL BY PCR  LACTIC ACID, PLASMA  PROCALCITONIN  STREP PNEUMONIAE URINARY ANTIGEN  LEGIONELLA PNEUMOPHILA SEROGP 1 UR AG  TROPONIN I (HIGH SENSITIVITY)     EKG  ED ECG REPORT I, Valree Feild J, the attending physician, personally viewed and interpreted this ECG.   Date: 03/31/2023  EKG Time: 0105  Rate: 109  Rhythm: sinus tachycardia  Axis: Normal  Intervals:none  ST&T Change: Nonspecific    RADIOLOGY I have independently visualized and interpreted patient's chest x-ray as well as noted the radiology interpretation:  X-ray: Diffuse bilateral interstitial and hazy lung opacity  Official radiology report(s): DG Chest Port 1 View  Result Date: 03/31/2023 CLINICAL DATA:  Shortness of breath history of tumor on chest EXAM: PORTABLE CHEST 1 VIEW COMPARISON:  CT 07/18/2022, chest x-ray 07/21/2022 FINDINGS: Endotracheal tube tip about 3.5 cm superior to carina. Esophageal tube tip below the diaphragm but incompletely visualized. Emphysema. Diffuse bilateral interstitial and hazy lung opacity. More focal nodular opacity in the right mid lung. Cardiac size is normal. Aortic atherosclerosis. No pneumothorax emphysema. IMPRESSION: 1. Endotracheal tube tip about 3.5 cm superior to carina. Esophageal tube tip below the diaphragm but incompletely visualized. 2. Diffuse bilateral  interstitial and hazy lung opacity, either representing edema or diffuse lung infection. More focal nodular opacity in the right mid lung may be infectious/inflammatory or neoplastic in etiology. Electronically Signed   By: Jasmine Pang M.D.   On: 03/31/2023 01:05     PROCEDURES:  Critical Care performed: Yes, see critical care procedure note(s)  CRITICAL CARE Performed by: Irean Hong   Total critical care time: 45 minutes  Critical care time was exclusive of separately billable procedures and treating other patients.  Critical care was necessary to treat or prevent imminent or life-threatening deterioration.  Critical care was time spent personally by me on the following activities: development of treatment plan with patient and/or surrogate as well as nursing, discussions with consultants, evaluation of patient's response to treatment, examination of patient, obtaining history from patient or surrogate, ordering and performing treatments and interventions, ordering and review of laboratory studies, ordering and review of radiographic studies, pulse oximetry and re-evaluation of patient's condition.   [name]1-3 Lead EKG Interpretation  Performed by: Irean Hong, MD Authorized by: Irean Hong, MD     Ectopy: none     Conduction: normal   Comments:     Patient placed on cardiac monitor to evaluate for arrhythmias Procedure Name: Intubation Date/Time: 03/31/2023 12:34 AM  Performed by: Irean Hong, MDPre-anesthesia Checklist: Patient identified, Patient being monitored, Emergency Drugs available, Timeout performed and Suction available Oxygen Delivery Method: Ambu bag Preoxygenation: Pre-oxygenation with 100% oxygen Induction Type: Rapid sequence Ventilation: Mask ventilation without difficulty Laryngoscope Size: Glidescope and 3 Number of attempts: 1 Airway Equipment and Method: Rigid stylet Placement Confirmation: ETT inserted through vocal cords under direct vision, CO2  detector, Breath sounds checked- equal and bilateral and Positive ETCO2     MEDICATIONS ORDERED IN ED: Medications  propofol (DIPRIVAN) 1000 MG/100ML infusion (35 mcg/kg/min  40.5 kg Intravenous Rate/Dose Change 03/31/23 0214)  fentaNYL in NS (62mcg/ml) infusion-PREMIX (250 mcg/hr Intravenous Rate/Dose Change 03/31/23 0121)  docusate sodium (COLACE) capsule 100 mg (has no administration in  time range)  polyethylene glycol (MIRALAX / GLYCOLAX) packet 17 g (has no administration in time range)  heparin injection 5,000 Units (has no administration in time range)  famotidine (PEPCID) tablet 20 mg (has no administration in time range)  0.9 %  sodium chloride infusion (has no administration in time range)  norepinephrine (LEVOPHED) 4mg  in (0.016 mg/mL) premix infusion (2 mcg/min Intravenous New Bag/Given 03/31/23 0143)  docusate (COLACE) 50 MG/5ML liquid 100 mg (has no administration in time range)  polyethylene glycol (MIRALAX / GLYCOLAX) packet 17 g (has no administration in time range)  fentaNYL (SUBLIMAZE) bolus via infusion 25-100 mcg (has no administration in time range)  midazolam (VERSED) injection 1-2 mg (2 mg Intravenous Given 03/31/23 0226)  etomidate (AMIDATE) 2 MG/ML injection (20 mg  Given 03/31/23 0024)  succinylcholine (ANECTINE) 200 MG/10ML syringe (120 mg  Given 03/31/23 0024)  sodium chloride 0.9 % bolus 1,000 mL (0 mLs Intravenous Stopped 03/31/23 0155)  magnesium sulfate IVPB 2 g 50 mL (0 g Intravenous Stopped 03/31/23 0149)  fentaNYL (SUBLIMAZE) bolus via infusion 50 mcg (50 mcg Intravenous Bolus from Bag 03/31/23 0042)  sodium chloride 0.9 % bolus 1,000 mL (1,000 mLs Intravenous New Bag/Given 03/31/23 0116)    And  sodium chloride 0.9 % bolus 250 mL (250 mLs Intravenous New Bag/Given 03/31/23 0200)  vancomycin (VANCOCIN) IVPB 1000 mg/200 mL premix (1,000 mg Intravenous New Bag/Given 03/31/23 0119)  ceFEPIme (MAXIPIME) 2 g in sodium chloride 0.9 % 100 mL IVPB (0 g  Intravenous Stopped 03/31/23 0201)  propofol (DIPRIVAN) 10 mg/mL bolus/IV push 25 mg (25 mg Intravenous Given 03/31/23 0039)  midazolam (VERSED) injection 2 mg (2 mg Intravenous Given 03/31/23 0124)  iohexol (OMNIPAQUE) 300 MG/ML solution 100 mL (100 mLs Intravenous Contrast Given 03/31/23 0236)     IMPRESSION / MDM / ASSESSMENT AND PLAN / ED COURSE  I reviewed the triage vital signs and the nursing notes.                             72 year old female with COPD on chronic oxygen presenting in respiratory distress. Differential includes, but is not limited to, viral syndrome, bronchitis including COPD exacerbation, pneumonia, reactive airway disease including asthma, CHF including exacerbation with or without pulmonary/interstitial edema, pneumothorax, ACS, thoracic trauma, and pulmonary embolism.  I personally reviewed patient's records and note an oncology office visit from 03/29/2023 for follow-up mediastinal mass.  Patient's presentation is most consistent with acute presentation with potential threat to life or bodily function.  The patient is on the cardiac monitor to evaluate for evidence of arrhythmia and/or significant heart rate changes.  Patient arrives to the ED treatment room.  Rest with guppy breathing.  Confirmed that patient wishes to be a FULL CODE and intubated for airway protection.  Will obtain lab work, chest x-ray and admit to CCU.  Clinical Course as of 03/31/23 8295  Sat Mar 31, 2023  0046 Wet read chest x-ray demonstrates multifocal infiltrates.  Will initiate broad-spectrum IV antibiotics. [JS]  0111 White count normal.  Chest x-ray read by radiologist concerning for multifocal pneumonia. Have spoken with CCU intensivist O[place] who will get patient in the emergency department for admission. [JS]  [id number] Procalcitonin is elevated. [JS]    Clinical Course User Index [JS] Irean Hong, MD     FINAL CLINICAL IMPRESSION(S) / ED DIAGNOSES   Final diagnoses:  COPD  exacerbation (HCC)  Acute respiratory failure with hypoxia (HCC)  Sepsis, due to unspecified organism, unspecified whether acute organ dysfunction present [facility])  Multifocal pneumonia     Rx / DC Orders   ED Discharge Orders     None        Note:  This document was prepared using Dragon voice recognition software and may include unintentional dictation errors.   Irean Hong, MD 03/31/23 [phone]

## 2023-03-31 NOTE — Procedures (Signed)
Intubation Procedure Note  Vanessa Oneal  [id number]  04/17/1951  Date:03/31/23  Time:8:51 PM   Provider Performing:Slaton Reaser A Ireland Chagnon   Procedure: Intubation (31500)  Indication(s) Respiratory Failure  Consent Unable to obtain consent due to emergent nature of procedure.  Anesthesia Etomidate and Rocuronium  Time Out Verified patient identification, verified procedure, site/side was marked, verified correct patient position, special equipment/implants available, medications/allergies/relevant history reviewed, required imaging and test results available.  Sterile Technique Usual hand hygeine, masks, and gloves were used  Procedure Description Patient positioned in bed supine.  Sedation given as noted above.  Patient was intubated with endotracheal tube using Glidescope.  View was Grade 1 full glottis .  Number of attempts was 1.  Colorimetric CO2 detector was consistent with tracheal placement.  Complications/Tolerance None; patient tolerated the procedure well. Chest X-ray is ordered to verify placement.  EBL Minimal  Specimen(s) None   Webb Silversmith, DNP, CCRN, FNP-C, AGACNP-BC Acute Care & Family Nurse Practitioner  [facility]  See Amion for personal pager PCCM on call pager [phone] until 7 am

## 2023-03-31 NOTE — Procedures (Signed)
  PROCEDURE: BRONCHOSCOPY Therapeutic Aspiration of Tracheobronchial Tree  PROCEDURE DATE: 03/31/2023  TIME: 1045 NAME:  Vanessa Oneal  DOB:[birth year]  MRN: [id number] LOC:  IC11A/IC11A-AA    HOSP DAY: @LENGTHOFSTAYDAYS @ CODE STATUS:      Code Status Orders  (From admission, onward)           Start     Ordered   03/31/23 0128  Full code  Continuous       Question:  By:  Answer:  Consent: discussion documented in EHR   03/31/23 0129           Code Status History     Date Active Date Inactive Code Status Order ID Comments User Context   07/18/2022 0045 07/23/2022 2101 Full Code [id number]  Lurline Del, MD ED   03/26/2021 1259 03/27/2021 1614 Full Code [id number]  Delton Prairie, MD ED        Indications/Preliminary Diagnosis:   Consent: (Place X beside choice/s below)  The benefits, risks and possible complications of the procedure were        explained to:  ___ patient  ___ patient's family  ___ other:___________  who verbalized understanding and gave:  ___ verbal  ___ written  ___ verbal and written  ___ telephone  ___ other:________ consent.    X  Unable to obtain consent; procedure performed on emergent basis.     Other:       PRESEDATION ASSESSMENT: History and Physical has been performed. Patient meds and allergies have been reviewed. Presedation airway examination has been performed and documented. Baseline vital signs, sedation score, oxygenation status, and cardiac rhythm were reviewed. Patient was deemed to be in satisfactory condition to undergo the procedure.    PREMEDICATIONS:  On propofol   PROCEDURE DETAILS: Timeout performed and correct patient, name, & ID confirmed. Following prep per Pulmonary policy, appropriate sedation was administered. The Bronchoscope was inserted in to endotracheal tube. Therapeutic aspiration of Tracheobronchial tree was performed.  Airway exam proceeded with findings, technical procedures, and specimen collection as  noted below. At the end of exam the scope was withdrawn without incident. Impression and Plan as noted below.     Insertion Route (Place X beside choice below)   Nasal   Oral  X Endotracheal Tube   Tracheostomy      SPECIMENS (Sites): (Place X beside choice below)  Specimens Description   No Specimens Obtained     Washings    Lavage 15cc   Biopsies    Fine Needle Aspirates    Brushings    Sputum    FINDINGS:   60cc of NS distilled in RML. 15-20cc of cellular material aspirated.   Tracheobronchial tree was patient with scan secretions.  ESTIMATED BLOOD LOSS: none COMPLICATIONS/RESOLUTION: none  IMPRESSION:POST-PROCEDURE DX:    RECOMMENDATION/PLAN:   Follow up labs  Rhea Bleacher MD Pulmonary & Critical Care Medicine

## 2023-03-31 NOTE — Progress Notes (Signed)
Called down for patient in respiratory distress brought in by EMS. Intubated upon arrival and placed on transport vent with settings of VT 350 RR 18 PEEP +5 and FIO2 @ 50%. Pt has # 7.5 ETT 20 @ lip.

## 2023-03-31 NOTE — Progress Notes (Signed)
Patient extubated. Tolerated well. Attempting to get patient to stop talking to help with her breathing. She is shaking all extremities on the bed saying she needs her klonopin.  Dr. Arville Lime aware and medications ordered.

## 2023-03-31 NOTE — ED Triage Notes (Signed)
Pt from home via [facility] with difficulty breathing. At home  66% RA, On non-rebreather, recently diagones with a tumor on chest. Has been having shortness of breath all day.  EMS administered Solomedrol  2 Albuterol Nebs.  Upon arrival to excam room pt in severe respiratory distress.

## 2023-03-31 NOTE — Procedures (Signed)
Extubation Procedure Note  Patient Details:   Name: Vanessa Oneal DOB: 1951-10-09 MRN: [id number]   Airway Documentation:    Vent end date: 03/31/23 Vent end time: 1100   Evaluation  O2 sats: stable throughout Complications: No apparent complications Patient did tolerate procedure well. Bilateral Breath Sounds: Clear   Yes able to cough and speak.  Ronda Fairly [name] 03/31/2023, 11:04 AM

## 2023-03-31 NOTE — ED Notes (Signed)
Dr. Dolores Frame at bedside, RT at bedside.  Pt intubated 20 at the lip positive color change.

## 2023-03-31 NOTE — Progress Notes (Signed)
PHARMACY -  BRIEF ANTIBIOTIC NOTE   Pharmacy has received consult(s) for Vancomycin, Cefepime from an ED provider.  The patient's profile has been reviewed for ht/wt/allergies/indication/available labs.    One time order(s) placed for Vancomycin 1 gm IV X 1 and Cefepime 2 gm IV X 1  Further antibiotics/pharmacy consults should be ordered by admitting physician if indicated.                       Thank you, Michi Herrmann D 03/31/2023  1:02 AM

## 2023-03-31 NOTE — Progress Notes (Signed)
Niece [name] at bedside. Wanting to know why patient isn't getting her Klonopin and brought patient ice cream. Patient insistent on eating it. Swallow study performed, patient passed. Dr. Tommie Ard diet. Orders written. Patient tolerating ice cream well.

## 2023-03-31 NOTE — H&P (Addendum)
NAME:  Vanessa Oneal, MRN:  [id number], DOB:  12/09/1950, LOS: 0 ADMISSION DATE:  03/31/2023, CONSULTATION DATE: 03/31/2023 REFERRING MD: Chiquita Loth CHIEF COMPLAINT: Acute respiratory distress   HPI  72 y.o female w/ significant PMH of COPD, Emphysema, GERD, RA, Fibromyalgia, tobacco abuse, h/o drug abuse (cocaine). EtOH Abuse, Anxiety and Depression and recent diagnosis of chest wall mass concerning for Lymphadenopathy who presented to the ED with chief complaints of acute respiratory distress.  On review of chart, patient recently presented to [facility] emergency room on 03/27/2023 with complaints of chest pain.  CTA chest and CT abdomen pelvis with contrast was obtained which showed extensive mediastinal soft tissue mass and a large retroperitoneal soft tissue mass favored to represent lymphoma. Patient was referred to outpatient oncology for further evaluation.  Patient was evaluated by oncologist Dr. Cathie Hoops on 03/29/2023 who recommended a PET scan and ultrasound-guided biopsy of the right supraclavicular lymphadenopathy for further evaluation.  She also had myeloma panel and peripheral blood flow cytometric and LDH obtained.  Prior to presenting to the ED, patient had been complaining of progressive shortness of breath  all day and was found with oxygen saturation of 66% on room air.  She was placed on a nonrebreather and given Solu-Medrol 125 and 2 albuterol DuoNebs prior to transporting to the ED for further evaluation.   ED Course: Initial vital signs showed HR of 77 beats/minute, BP 90/60 mm Hg, the RR 22 breaths/minute, and the oxygen saturation 78% on NRB and a temperature of 98.10F (36.8C).  He was noted to be in acute respiratory distress with increased work of breathing.  Due to concerns for impending respiratory arrest, patient was intubated for airway protection.  Pertinent Labs/Diagnostics Findings: Na+/ K+:130/4.0 Glucose:150 BUN/Cr.:20/0.50   WBC:10 Hgb/Hct: 10.0/30.5  PCT: 0.32 Lactic  acid: 2.3 COVID PCR: Negative, Troponin:  BNP: 418.2  pO2 82; pCO2 56; pH 7.4;  HCO3 34.7, %O2 Sat 97.9.   Patient given 30 cc/kg of fluids and started on broad-spectrum antibiotics Vanco cefepime and Flagyl for sepsis. PCCM consulted for admission.  Past Medical History  COPD, Emphysema, GERD, RA, Fibromyalgia, tobacco abuse, h/o drug abuse (cocaine). EtOH Abuse, Anxiety and Depression and recent diagnosis of chest wall mass concerning for Lymphadenopathy  Significant Hospital Events   4/27: Admitted to ICU with acute on chronic hypoxic hypercapnic respiratory failure  due to multifocal pneumonia requiring mechanical ventilation  Consults:  Oncology  Procedures:  4/27: Intubation  Significant Diagnostic Tests:  4/27: Chest Xray> 4/27: CTA abdomen and pelvis> 4/27: CTA Chest>  Interim History / Subjective:  -remains on the vent with no pressor requirement  Micro Data:  4/27: SARS-CoV-2 PCR> negative 4/27: Influenza PCR> negative 4/27: Respiratory panel> 4/27: Blood culture x2> 4/27: MRSA PCR>>  4/27: Strep pneumo urinary antigen> 4/27: Legionella urinary antigen>  Antimicrobials:  Vancomycin 4/27> Cefepime 4/27  Metronidazole 4/27 x 1 in the ED  OBJECTIVE  Blood pressure 107/82, pulse (!) 107, temperature 99.4 F (37.4 C), resp. rate 19, weight 40.5 kg, SpO2 96 %.    Vent Mode: AC FiO2 (%):  [50 %] 50 % Set Rate:  [18 bmp] 18 bmp Vt Set:  [350 mL] 350 mL PEEP:  [5 cmH20] 5 cmH20  No intake or output data in the 24 hours ending 03/31/23 0140 Filed Weights   03/31/23 0028 03/31/23 0046  Weight: 40.5 kg 40.5 kg   Physical Examination  GENERAL: 72 year-old critically ill cachetic patient lying in the bed intubated and  sedated EYES: PEERLA. No scleral icterus. Extraocular muscles intact.  HEENT: Head atraumatic, normocephalic. Oropharynx and nasopharynx clear.  NECK:  No JVD, supple  LUNGS: Decreased breath sounds bilaterally.  Diffused rhonchi throughout the lung  field.  No use of accessory muscles of respiration.  CARDIOVASCULAR: S1, S2 normal. No murmurs, rubs, or gallops.  ABDOMEN: Soft, NTND EXTREMITIES: No swelling or erythema.  Capillary refill is > 3 seconds in all extremities. Pulses palpable distally. NEUROLOGIC: The patient is intubated and sedated.  No obvious focal neurological deficit.  Cranial nerves are intact.  SKIN: No obvious rash, lesion, or ulcer. Warm to touch Labs/imaging that I havepersonally reviewed  (right click and "Reselect all SmartList Selections" daily)     Labs   CBC: Recent Labs  Lab 03/29/23 1553 03/31/23 0046  WBC 7.5 10.1  NEUTROABS 5.5 7.6  HGB 10.3* 10.0*  HCT 30.6* 30.5*  MCV 92.7 95.3  PLT 262 276    Basic Metabolic Panel: Recent Labs  Lab 03/29/23 1553 03/31/23 0046  NA 129* 130*  K 3.8 4.0  CL 87* 93*  CO2 33* 28  GLUCOSE 131* 150*  BUN 17 20  CREATININE 0.65 0.50  CALCIUM 8.7* 8.2*  MG  --  1.5*   GFR: Estimated Creatinine Clearance: 41.2 mL/min (by C-G formula based on SCr of 0.5 mg/dL). Recent Labs  Lab 03/29/23 1553 03/31/23 0046  WBC 7.5 10.1  LATICACIDVEN  --  2.3*    Liver Function Tests: Recent Labs  Lab 03/29/23 1553 03/31/23 0046  AST 40 34  ALT 15 12  ALKPHOS 75 66  BILITOT 0.3 0.5  PROT 7.0 6.7  ALBUMIN 3.1* 2.5*   No results for input(s): "LIPASE", "AMYLASE" in the last 168 hours. No results for input(s): "AMMONIA" in the last 168 hours.  ABG    Component Value Date/Time   PHART 7.4 03/31/2023 0028   PCO2ART 56 (H) 03/31/2023 0028   PO2ART 82 (L) 03/31/2023 0028   HCO3 34.7 (H) 03/31/2023 0028   O2SAT 97.9 03/31/2023 0028     Coagulation Profile: No results for input(s): "INR", "PROTIME" in the last 168 hours.  Cardiac Enzymes: No results for input(s): "CKTOTAL", "CKMB", "CKMBINDEX", "TROPONINI" in the last 168 hours.  HbA1C: HB A1C (BAYER DCA - WAIVED)  Date/Time Value Ref Range Status  01/05/2020 03:31 PM 4.8 <7.0 % Final    Comment:                                           Diabetic Adult            <7.0                                       Healthy Adult        4.3 - 5.7                                                           (DCCT/NGSP) American Diabetes Association's Summary of Glycemic Recommendations for Adults with Diabetes: Hemoglobin A1c <7.0%. More stringent glycemic goals (A1c <6.0%) may further reduce complications at the  cost of increased risk of hypoglycemia.   11/19/2017 01:41 PM 4.9 <7.0 % Final    Comment:                                          Diabetic Adult            <7.0                                       Healthy Adult        4.3 - 5.7                                                           (DCCT/NGSP) American Diabetes Association's Summary of Glycemic Recommendations for Adults with Diabetes: Hemoglobin A1c <7.0%. More stringent glycemic goals (A1c <6.0%) may further reduce complications at the cost of increased risk of hypoglycemia.    Hgb A1c MFr Bld  Date/Time Value Ref Range Status  07/18/2022 01:12 AM 5.1 4.8 - 5.6 % Final    Comment:    (NOTE) Pre diabetes:          5.7%-6.4%  Diabetes:              >6.4%  Glycemic control for   <7.0% adults with diabetes     CBG: No results for input(s): "GLUCAP" in the last 168 hours.  Review of Systems:   Unable to be obtained secondary to the patient's intubated and sedated status.    Past Medical History  She,  has a past medical history of Arthritis, Depression, Fibromyalgia, GERD (gastroesophageal reflux disease), H/O drug dependence (HCC), and Wears dentures.   Surgical History    Past Surgical History:  Procedure Laterality Date   BREAST SURGERY     Silicone implants, then removal   CATARACT EXTRACTION W/PHACO Left 05/06/2019   Procedure: CATARACT EXTRACTION PHACO AND INTRAOCULAR LENS PLACEMENT (IOC) LEFT;  Surgeon: Galen Manila, MD;  Location: [facility];  Service: Ophthalmology;  Laterality: Left;   TUBAL  LIGATION       Social History   reports that she has been smoking cigarettes. She has a 14.00 pack-year smoking history. She has never used smokeless tobacco. She reports that she does not currently use alcohol. She reports that she does not use drugs.   Family History   Her family history includes COPD in her sister; Suicidality in her father.   Allergies Allergies  Allergen Reactions   Gabapentin Anaphylaxis     Home Medications  Prior to Admission medications   Medication Sig Start Date End Date Taking? Authorizing Provider  albuterol (VENTOLIN HFA) 108 (90 Base) MCG/ACT inhaler Inhale 2 puffs into the lungs every 6 (six) hours as needed for wheezing or shortness of breath. Patient not taking: Reported on 03/29/2023 07/23/22   Kathrynn Running, MD  clonazePAM (KLONOPIN) 0.5 MG tablet Take by mouth.    [provider]  Fluticasone-Umeclidin-Vilant (TRELEGY ELLIPTA) 100-62.5-25 MCG/ACT AEPB Inhale 1 Inhalation into the lungs daily in the afternoon. Patient not taking: Reported on 03/29/2023 07/31/22   Olevia Perches P, DO  naloxone [facility]) nasal spray  4 mg/0.1 mL 1 spray into the nostril 1x, may repeat dose in alternate nostrils every 2-3 minutes until EMS arrives 03/29/23   Olevia Perches P, DO  oxyCODONE-acetaminophen (PERCOCET) 10-325 MG tablet Take 1 tablet by mouth every 6 (six) hours as needed for up to 5 days for pain. 03/29/23 04/03/23  Johnson, Megan P, DO  promethazine (PHENERGAN) 25 MG tablet TAKE 1/2 TABLET BY MOUTH EVERY 8 HOURS AS NEEDED FOR NAUSEA AND VOMITING 07/31/22   Olevia Perches P, DO  Scheduled Meds:  docusate  100 mg Per Tube BID   famotidine  20 mg Per Tube BID   heparin  5,000 Units Subcutaneous Q8H   polyethylene glycol  17 g Per Tube Daily   promethazine  25 mg Intramuscular Once   Continuous Infusions:  sodium chloride     fentaNYL infusion INTRAVENOUS 250 mcg/hr (03/31/23 0121)   norepinephrine (LEVOPHED) Adult infusion 2 mcg/min (03/31/23 0143)    propofol (DIPRIVAN) infusion 30 mcg/kg/min (03/31/23 0121)   vancomycin 1,000 mg (03/31/23 0119)   PRN Meds:.docusate sodium, fentaNYL, midazolam, polyethylene glycol, promethazine (PHENERGAN) injection (IM or IVPB)   Active Hospital Problem list     Assessment & Plan:  #Acute on Chronic Hypoxic and Hypercapnic Respiratory Failure #Bilateral Pleural Effusion #Multifocal Pneumonia #Acute Exacerbation of COPD   Background emphysema and COPD, on triple therapy at home. Now with hypercapnic respiratory failure requiring intubation.  Repeat CT shows bilateral  dense consolidations concerning for multifocal pneumonia and pleural effusion and Extensive cystic/low-density adenopathy   -Full vent support, implement lung protective strategies -Plateau pressures less than 30 cm H20 -Wean FiO2 & PEEP as tolerated to maintain O2 sats >92% -Follow intermittent Chest X-ray & ABG as needed -Spontaneous Breathing Trials when respiratory parameters met and mental status permits -Implement VAP Bundle -Bronchodilators and Pulmicort nebs -IV Solu-Medrol 40 mg daily -Antibiotics as below -BAL to eval for infectious process such as TB  #Sepsis Secondary to Multifocal Pneumonia meets SIRS criteria: Heart Rate 112 beats/minute, Respiratory Rate 24 breaths/minute,Temperature 95.8 -Supplemental oxygen as needed, to maintain SpO2 > 90% -F/u cultures, trend lactic/ PCT -Monitor WBC/ fever curve -IV antibiotics: cefepime & vancomycin  -IVF hydration as needed -Pressors for MAP goal >65 -Strict I/O's -ID Consult for ? necrotic metastatic disease, infectious etiology such as tuberculous or nontuberculous mycobacterial infection, and lymphoproliferative disease.  #Elevated Troponin ?Demand No dynamic EKG changes -Serial EKG -trend troponin -Heparin prophylactic -Recent CTA chest 3 days ago did not shows evidence of Pulmonary Embolism -Obtain Echo  #Lymphadenopathy  #Mediastinal Mass CT chest,  Abd/Pelvis shows Extensive cystic/low-density right supraclavicular, mediastinal, hilar, upper abdominal and retroperitoneal adenopathy concerning for necrotic metastatic disease, infectious etiology such as tuberculous or nontuberculous mycobacterial infection, and lymphoproliferative disease. -myeloma panel, peripheral blood flow cytometry,  pending from prior work up on 4/25 per oncology  -PET Scan and Ultrasound Guided biopsy of right supraclavicular pending -Oncology consult   Best practice:  Diet:  NPO Pain/Anxiety/Delirium protocol (if indicated): Yes (RASS goal -1) VAP protocol (if indicated): Yes DVT prophylaxis: Subcutaneous Heparin GI prophylaxis: H2B Glucose control:  SSI Yes Central venous access:  N/A Arterial line:  N/A Foley:  Yes, and it is still needed Mobility:  bed rest  PT consulted: N/A Last date of multidisciplinary goals of care discussion [4/27] Code Status:  full code Disposition: ICU   = Goals of Care = Code Status Order: FULL  Primary Emergency Contact: Mullis,[name] J, Home Phone: [phone] Wishes to pursue full aggressive treatment and intervention  options, including CPR and intubation, but goals of care will be addressed on going with family if that should become necessary.  Critical care time: 45 minutes        Webb Silversmith DNP, CCRN, FNP-C, AGACNP-BC Acute Care & Family Nurse Practitioner [facility] PCCM on call pager [phone]

## 2023-04-01 ENCOUNTER — Inpatient Hospital Stay (HOSPITAL_COMMUNITY)
Admit: 2023-04-01 | Discharge: 2023-04-01 | Disposition: A | Discharge status: Home / Self Care | Payer: 59 | Attending: Nurse Practitioner | Admitting: Nurse Practitioner

## 2023-04-01 ENCOUNTER — Inpatient Hospital Stay: Payer: 59

## 2023-04-01 ENCOUNTER — Other Ambulatory Visit: Payer: Self-pay

## 2023-04-01 DIAGNOSIS — J9621 Acute and chronic respiratory failure with hypoxia: Secondary | ICD-10-CM

## 2023-04-01 DIAGNOSIS — J9622 Acute and chronic respiratory failure with hypercapnia: Secondary | ICD-10-CM | POA: Diagnosis not present

## 2023-04-01 DIAGNOSIS — I5031 Acute diastolic (congestive) heart failure: Secondary | ICD-10-CM

## 2023-04-01 LAB — GLUCOSE, CAPILLARY
Glucose-Capillary: 114 mg/dL — ABNORMAL HIGH (ref 70–99)
Glucose-Capillary: 123 mg/dL — ABNORMAL HIGH (ref 70–99)
Glucose-Capillary: 146 mg/dL — ABNORMAL HIGH (ref 70–99)
Glucose-Capillary: 91 mg/dL (ref 70–99)
Glucose-Capillary: 148 mg/dL — ABNORMAL HIGH (ref 70–99)

## 2023-04-01 LAB — BASIC METABOLIC PANEL
Anion gap: 6 (ref 5–15)
Anion gap: 7 (ref 5–15)
BUN: 20 mg/dL (ref 8–23)
BUN: 19 mg/dL (ref 8–23)
CO2: 29 mmol/L (ref 22–32)
CO2: 27 mmol/L (ref 22–32)
Calcium: 8.4 mg/dL — ABNORMAL LOW (ref 8.9–10.3)
Calcium: 8.6 mg/dL — ABNORMAL LOW (ref 8.9–10.3)
Chloride: 99 mmol/L (ref 98–111)
Chloride: 99 mmol/L (ref 98–111)
Creatinine, Ser: 0.45 mg/dL (ref 0.44–1.00)
Creatinine, Ser: 0.52 mg/dL (ref 0.44–1.00)
GFR, Estimated: >60 mL/min (ref >60)
GFR, Estimated: >60 mL/min (ref >60)
Glucose, Bld: 129 mg/dL — ABNORMAL HIGH (ref 70–99)
Glucose, Bld: 131 mg/dL — ABNORMAL HIGH (ref 70–99)
Potassium: 5.3 mmol/L — ABNORMAL HIGH (ref 3.5–5.1)
Potassium: 5.1 mmol/L (ref 3.5–5.1)
Sodium: 134 mmol/L — ABNORMAL LOW (ref 135–145)
Sodium: 133 mmol/L — ABNORMAL LOW (ref 135–145)

## 2023-04-01 LAB — CBC
HCT: 26.6 % — ABNORMAL LOW (ref 36.0–46.0)
Hemoglobin: 8.9 g/dL — ABNORMAL LOW (ref 12.0–15.0)
MCH: 31.6 pg (ref 26.0–34.0)
MCHC: 33.5 g/dL (ref 30.0–36.0)
MCV: 94.3 fL (ref 80.0–100.0)
Platelets: 240 10*3/uL (ref 150–400)
RBC: 2.82 MIL/uL — ABNORMAL LOW (ref 3.87–5.11)
RDW: 13.4 % (ref 11.5–15.5)
WBC: 6.9 10*3/uL (ref 4.0–10.5)
nRBC: 0 % (ref 0.0–0.2)

## 2023-04-01 LAB — CULTURE, RESPIRATORY W GRAM STAIN

## 2023-04-01 LAB — POTASSIUM
Potassium: 5.2 mmol/L — ABNORMAL HIGH (ref 3.5–5.1)
Potassium: 5.2 mmol/L — ABNORMAL HIGH (ref 3.5–5.1)

## 2023-04-01 LAB — PHOSPHORUS
Phosphorus: 2.8 mg/dL (ref 2.5–4.6)
Phosphorus: 2.9 mg/dL (ref 2.5–4.6)

## 2023-04-01 LAB — ECHOCARDIOGRAM COMPLETE
Area-P 1/2: 5.13 cm2
Calc EF: 47.9 %
Height: 61 in
P 1/2 time: 434 msec
S' Lateral: 2.9 cm
Single Plane A2C EF: 51.5 %
Single Plane A4C EF: 41.9 %
Weight: 1386.25 oz

## 2023-04-01 LAB — HEMOGLOBIN A1C
Hgb A1c MFr Bld: 6.2 % — ABNORMAL HIGH (ref 4.8–5.6)
Mean Plasma Glucose: 131.24 mg/dL

## 2023-04-01 LAB — MAGNESIUM
Magnesium: 1.9 mg/dL (ref 1.7–2.4)
Magnesium: 1.7 mg/dL (ref 1.7–2.4)

## 2023-04-01 MED ORDER — QUETIAPINE FUMARATE 25 MG PO TABS: 25.0000 mg | ORAL_TABLET | Freq: Every day | ORAL | Status: DC

## 2023-04-01 MED ORDER — FREE WATER
30.0000 mL | Status: DC
  Administered 2023-04-01 – 2023-04-20 (×105): 30 mL

## 2023-04-01 MED ORDER — CLONAZEPAM 0.5 MG PO TABS
0.5000 mg | ORAL_TABLET | Freq: Two times a day (BID) | ORAL | Status: DC
  Administered 2023-04-01 – 2023-04-09 (×17): 0.5 mg
  Filled 2023-04-01 (×17): qty 1

## 2023-04-01 MED ORDER — SODIUM CHLORIDE 0.9% FLUSH
10.0000 mL | Freq: Two times a day (BID) | INTRAVENOUS | Status: DC
  Administered 2023-04-01: 40 mL
  Administered 2023-04-02 – 2023-04-07 (×11): 10 mL
  Administered 2023-04-07: 30 mL
  Administered 2023-04-08 – 2023-04-10 (×5): 10 mL
  Administered 2023-04-10: 20 mL
  Administered 2023-04-11: 10 mL
  Administered 2023-04-12: 20 mL
  Administered 2023-04-13: 10 mL
  Administered 2023-04-14: 30 mL
  Administered 2023-04-14: 10 mL
  Administered 2023-04-15: 40 mL
  Administered 2023-04-15 – 2023-04-20 (×8): 10 mL
  Administered 2023-04-20: 30 mL

## 2023-04-01 MED ORDER — PERFLUTREN LIPID MICROSPHERE
1.0000 mL | INTRAVENOUS | Status: AC | PRN
  Administered 2023-04-01: 6 mL via INTRAVENOUS

## 2023-04-01 MED ORDER — INSULIN ASPART 100 UNIT/ML IV SOLN
10.0000 [IU] | Freq: Once | INTRAVENOUS | Status: AC
  Administered 2023-04-01: 10 [IU] via INTRAVENOUS
  Filled 2023-04-01: qty 0.1

## 2023-04-01 MED ORDER — ONDANSETRON HCL 4 MG/2ML IJ SOLN
4.0000 mg | Freq: Four times a day (QID) | INTRAMUSCULAR | Status: DC | PRN
  Administered 2023-04-01 – 2023-04-02 (×2): 4 mg via INTRAVENOUS
  Filled 2023-04-01 (×2): qty 2

## 2023-04-01 MED ORDER — SODIUM CHLORIDE 0.9% FLUSH: 10.0000 mL | INTRAVENOUS | Status: DC | PRN

## 2023-04-01 MED ORDER — VITAL 1.5 CAL PO LIQD
1000.0000 mL | ORAL | Status: DC
  Administered 2023-04-01: 1000 mL

## 2023-04-01 MED ORDER — PROSOURCE TF20 ENFIT COMPATIBL EN LIQD
60.0000 mL | Freq: Every day | ENTERAL | Status: DC
  Administered 2023-04-01 – 2023-04-02 (×2): 60 mL
  Filled 2023-04-01 (×2): qty 60

## 2023-04-01 MED ORDER — DEXTROSE 50 % IV SOLN
1.0000 | Freq: Once | INTRAVENOUS | Status: AC
  Administered 2023-04-01: 50 mL via INTRAVENOUS
  Filled 2023-04-01: qty 50

## 2023-04-01 MED ORDER — METHYLPREDNISOLONE SODIUM SUCC 40 MG IJ SOLR
40.0000 mg | Freq: Two times a day (BID) | INTRAMUSCULAR | Status: DC
  Administered 2023-04-01 – 2023-04-02 (×3): 40 mg via INTRAVENOUS
  Filled 2023-04-01 (×3): qty 1

## 2023-04-01 MED ORDER — QUETIAPINE FUMARATE 25 MG PO TABS
25.0000 mg | ORAL_TABLET | Freq: Two times a day (BID) | ORAL | Status: DC
  Administered 2023-04-01 – 2023-04-11 (×21): 25 mg
  Filled 2023-04-01 (×21): qty 1

## 2023-04-01 MED ORDER — INSULIN ASPART 100 UNIT/ML IJ SOLN
0.0000 [IU] | INTRAMUSCULAR | Status: DC
  Administered 2023-04-01 (×3): 1 [IU] via SUBCUTANEOUS
  Administered 2023-04-02 (×2): 2 [IU] via SUBCUTANEOUS
  Administered 2023-04-02: 1 [IU] via SUBCUTANEOUS
  Administered 2023-04-02: 2 [IU] via SUBCUTANEOUS
  Administered 2023-04-03: 1 [IU] via SUBCUTANEOUS
  Administered 2023-04-03: 2 [IU] via SUBCUTANEOUS
  Administered 2023-04-03 – 2023-04-05 (×2): 1 [IU] via SUBCUTANEOUS
  Administered 2023-04-05: 2 [IU] via SUBCUTANEOUS
  Administered 2023-04-06: 1 [IU] via SUBCUTANEOUS
  Administered 2023-04-06: 2 [IU] via SUBCUTANEOUS
  Administered 2023-04-06 – 2023-04-07 (×4): 1 [IU] via SUBCUTANEOUS
  Administered 2023-04-07 – 2023-04-08 (×2): 2 [IU] via SUBCUTANEOUS
  Administered 2023-04-08 (×3): 1 [IU] via SUBCUTANEOUS
  Administered 2023-04-09: 2 [IU] via SUBCUTANEOUS
  Administered 2023-04-09 (×3): 1 [IU] via SUBCUTANEOUS
  Administered 2023-04-11 (×4): 2 [IU] via SUBCUTANEOUS
  Administered 2023-04-12 (×2): 1 [IU] via SUBCUTANEOUS
  Administered 2023-04-12 (×2): 2 [IU] via SUBCUTANEOUS
  Administered 2023-04-13 (×3): 1 [IU] via SUBCUTANEOUS
  Administered 2023-04-14 (×2): 2 [IU] via SUBCUTANEOUS
  Administered 2023-04-14 – 2023-04-15 (×5): 1 [IU] via SUBCUTANEOUS
  Administered 2023-04-15: 2 [IU] via SUBCUTANEOUS
  Administered 2023-04-16: 1 [IU] via SUBCUTANEOUS
  Administered 2023-04-16: 2 [IU] via SUBCUTANEOUS
  Administered 2023-04-16 – 2023-04-17 (×5): 1 [IU] via SUBCUTANEOUS
  Administered 2023-04-17: 2 [IU] via SUBCUTANEOUS
  Administered 2023-04-17 – 2023-04-19 (×11): 1 [IU] via SUBCUTANEOUS
  Administered 2023-04-20: 2 [IU] via SUBCUTANEOUS
  Administered 2023-04-20 (×2): 1 [IU] via SUBCUTANEOUS
  Filled 2023-04-01 (×71): qty 1

## 2023-04-01 MED ORDER — BUDESONIDE 0.5 MG/2ML IN SUSP
0.5000 mg | Freq: Two times a day (BID) | RESPIRATORY_TRACT | Status: DC
  Administered 2023-04-01 – 2023-04-20 (×38): 0.5 mg via RESPIRATORY_TRACT
  Filled 2023-04-01 (×38): qty 2

## 2023-04-01 NOTE — Progress Notes (Signed)
Peripherally Inserted Central Catheter Placement  The IV Nurse has discussed with the patient and/or persons authorized to consent for the patient, the purpose of this procedure and the potential benefits and risks involved with this procedure.  The benefits include less needle sticks, lab draws from the catheter, and the patient may be discharged home with the catheter. Risks include, but not limited to, infection, bleeding, blood clot (thrombus formation), and puncture of an artery; nerve damage and irregular heartbeat and possibility to perform a PICC exchange if needed/ordered by physician.  Alternatives to this procedure were also discussed.  Bard Power PICC patient education guide, fact sheet on infection prevention and patient information card has been provided to patient /or left at bedside.  [name] , niece, gave consent at bedside.  PICC Placement Documentation  PICC Triple Lumen 04/01/23 Right Basilic 35 cm 0 cm (Active)  Indication for Insertion or Continuance of Line Vasoactive infusions;Prolonged intravenous therapies;Limited venous access - need for IV therapy >5 days (PICC only) 04/01/23 1718  Exposed Catheter (cm) 0 cm 04/01/23 1718  Site Assessment Clean, Dry, Intact 04/01/23 1718  Lumen #1 Status Flushed;Saline locked;Blood return noted 04/01/23 1718  Lumen #2 Status Flushed;Saline locked;Blood return noted 04/01/23 1718  Lumen #3 Status Flushed;Saline locked;Blood return noted 04/01/23 1718  Dressing Type Transparent;Securing device 04/01/23 1718  Dressing Status Antimicrobial disc in place;Clean, Dry, Intact 04/01/23 1718  Safety Lock Not Applicable 04/01/23 1718  Line Care Connections checked and tightened 04/01/23 1718  Line Adjustment (NICU/IV Team Only) No 04/01/23 1718  Dressing Intervention New dressing 04/01/23 1718  Dressing Change Due 04/08/23 04/01/23 1718       Elliot Dally 04/01/2023, 5:19 PM

## 2023-04-01 NOTE — Consult Note (Signed)
PHARMACY CONSULT NOTE - FOLLOW UP  Pharmacy Consult for Electrolyte Monitoring and Replacement   Recent Labs: Potassium (mmol/L)  Date Value  04/01/2023 5.3 (H)  01/03/2014 3.6   Magnesium (mg/dL)  Date Value  16/09/9603 1.9  01/03/2014 1.9   Calcium (mg/dL)  Date Value  54/08/8118 8.4 (L)   Calcium, Total (mg/dL)  Date Value  14/78/2956 7.4 (L)   Albumin (g/dL)  Date Value  21/30/8657 2.5 (L)  07/31/2022 2.9 (L)  01/01/2014 3.2 (L)   Phosphorus (mg/dL)  Date Value  84/69/6295 2.8   Sodium (mmol/L)  Date Value  04/01/2023 134 (L)  07/31/2022 137  01/03/2014 139     Assessment: 72 y.o female w/ significant PMH of COPD, Emphysema, GERD, RA, Fibromyalgia, tobacco abuse, h/o drug abuse (cocaine). EtOH Abuse, Anxiety and Depression and recent diagnosis of chest wall mass concerning for Lymphadenopathy who presented to the ED with chief complaints of acute respiratory distress. Patient currently intubated and transferred to ICU. Pharmacy has been consulted to manage and replace electrolytes while under PCCM care.  Goal of Therapy:  Electrolytes WNL  Plan:  Hyperkalemia - recheck@1400 : 5.1 - Will defer pharmacological therapy currently and continue to monitor - Will recheck electrolytes tomorrow with AM labs  Bettey Costa ,PharmD Clinical Pharmacist 04/01/2023 9:20 AM

## 2023-04-01 NOTE — Progress Notes (Signed)
Patient extubated earlier today at 1100 am  to [facility]. Patient was found in acute respiratory distress at change of shift. She was noted to be tripoding, using accessory muscle and unable to speak in complete sentences. Decision was made to emergently re-intubate for airway protection. Patient's son was updated at the bedside.  Webb Silversmith, DNP, CCRN, FNP-C, AGACNP-BC Acute Care & Family Nurse Practitioner  [facility]  See Amion for personal pager PCCM on call pager [phone] until 7 am

## 2023-04-01 NOTE — Progress Notes (Signed)
Brief Nutrition Note  Consult received for enteral/tube feeding initiation and management.  Adult Enteral Nutrition Protocol initiated. Full assessment to follow.  OG tube in place with tip located in stomach per xray imaging.   Initiate TF's  Vital 1.5 @40  (960 ml)  -start at 20 ml/hr and advance by 10ml q12hrs to goal rate 40 ml/hr  Provides 1440 kcal, 65 g protein, 763 ml fluid   Patient is at risk for refeeding. Monitor K+, phos and mag. Replete when necessary per protocol  RD recommends thiamin x5 days due to refeed risk   Admitting Dx: COPD exacerbation (HCC) [J44.1] Acute respiratory failure with hypoxia (HCC) [J96.01] Acute on chronic respiratory failure with hypoxia and hypercapnia (HCC) [J96.21, J96.22] Multifocal pneumonia [J18.9] Sepsis, due to unspecified organism, unspecified whether acute organ dysfunction present (HCC) [A41.9]  Body mass index is 16.37 kg/m. Pt meets criteria for underweight based on current BMI.  Labs:  Recent Labs  Lab 03/29/23 1553 03/31/23 0046 03/31/23 0926 04/01/23 0402 04/01/23 1120  NA 129* 130*  --  134*  --   K 3.8 4.0  --  5.3* 5.2*  CL 87* 93*  --  99  --   CO2 33* 28  --  29  --   BUN 17 20  --  20  --   CREATININE 0.65 0.50  --  0.45  --   CALCIUM 8.7* 8.2*  --  8.4*  --   MG  --  1.5* 2.0 1.9  --   PHOS  --   --   --  2.8  --   GLUCOSE 131* 150*  --  129*  --     Leodis Rains, RDN, LDN  Clinical Nutrition

## 2023-04-01 NOTE — Plan of Care (Signed)
Discussed with patient at shift change plan of care for the evening, pain medication and sedation medications with no evidence of learning at this time.  Patient is ventilated.   Problem: Education: Goal: Knowledge of General Education information will improve Description: Including pain rating scale, medication(s)/side effects and non-pharmacologic comfort measures Outcome: Not Progressing   Problem: Health Behavior/Discharge Planning: Goal: Ability to manage health-related needs will improve Outcome: Progressing

## 2023-04-01 NOTE — Progress Notes (Signed)
NAME:  Vanessa Oneal, MRN:  [id number], DOB:  Oct 01, 1951, LOS: 1 ADMISSION DATE:  03/31/2023, CONSULTATION DATE: 03/31/2023 REFERRING MD: Chiquita Loth CHIEF COMPLAINT: Acute respiratory distress   HPI  72 y.o female w/ significant PMH of COPD, Emphysema, GERD, RA, Fibromyalgia, tobacco abuse, h/o drug abuse (cocaine). EtOH Abuse, Anxiety and Depression and recent diagnosis of chest wall mass concerning for Lymphadenopathy who presented to the ED with chief complaints of acute respiratory distress.  On review of chart, patient recently presented to [facility] emergency room on 03/27/2023 with complaints of chest pain.  CTA chest and CT abdomen pelvis with contrast was obtained which showed extensive mediastinal soft tissue mass and a large retroperitoneal soft tissue mass favored to represent lymphoma. Patient was referred to outpatient oncology for further evaluation.  Patient was evaluated by oncologist Dr. Cathie Hoops on 03/29/2023 who recommended a PET scan and ultrasound-guided biopsy of the right supraclavicular lymphadenopathy for further evaluation.  She also had myeloma panel and peripheral blood flow cytometric and LDH obtained.  Prior to presenting to the ED, patient had been complaining of progressive shortness of breath  all day and was found with oxygen saturation of 66% on room air.  She was placed on a nonrebreather and given Solu-Medrol 125 and 2 albuterol DuoNebs prior to transporting to the ED for further evaluation.   ED Course: Initial vital signs showed HR of 77 beats/minute, BP 90/60 mm Hg, the RR 22 breaths/minute, and the oxygen saturation 78% on NRB and a temperature of 98.58F (36.8C).  He was noted to be in acute respiratory distress with increased work of breathing.  Due to concerns for impending respiratory arrest, patient was intubated for airway protection.  Pertinent Labs/Diagnostics Findings: Na+/ K+:130/4.0 Glucose:150 BUN/Cr.:20/0.50   WBC:10 Hgb/Hct: 10.0/30.5  PCT: 0.32 Lactic  acid: 2.3 COVID PCR: Negative, Troponin:  BNP: 418.2  pO2 82; pCO2 56; pH 7.4;  HCO3 34.7, %O2 Sat 97.9.   Patient given 30 cc/kg of fluids and started on broad-spectrum antibiotics Vanco cefepime and Flagyl for sepsis. PCCM consulted for admission.  Past Medical History  COPD, Emphysema, GERD, RA, Fibromyalgia, tobacco abuse, h/o drug abuse (cocaine). EtOH Abuse, Anxiety and Depression and recent diagnosis of chest wall mass concerning for Lymphadenopathy  Significant Hospital Events   4/27: Admitted to ICU with acute on chronic hypoxic hypercapnic respiratory failure  due to multifocal pneumonia requiring mechanical ventilation.  Pt failed extubation requiring reintubation  4/28: Pt remains mechanically intubated vent settings: FiO2 50%/PEEP 5.  Pt with severe anxiety on precedex and fentanyl gtts will start antianxiety medications per tubes   Consults:  Oncology  Procedures:  4/27: Intubation  Significant Diagnostic Tests:  4/27: Chest Xray>> Endotracheal tube tip about 3.5 cm superior to carina. Esophageal tube tip below the diaphragm but incompletely visualized. Diffuse bilateral interstitial and hazy lung opacity, either representing edema or diffuse lung infection. More focal nodular opacity in the right mid lung may be infectious/ inflammatory or neoplastic in etiology. 4/27: CTA Chest/Abdomen/Pelvis>>Moderate left and small right pleural effusions. Emphysema. Diffuse bilateral septal thickening suggests underlying edema. Heterogeneous bilateral airspace consolidations throughout the lungs suspicious for multifocal pneumonia. Extensive cystic/low-density right supraclavicular, mediastinal, hilar, upper abdominal and retroperitoneal adenopathy. Differential considerations include necrotic metastatic disease, infectious etiology such as tuberculous or nontuberculous mycobacterial infection, and lymphoproliferative disease. Endotracheal tube tip about 1.2 cm superior to carina. Esophageal  tube tip just beyond GE junction, suggest further advancement for more optimal positioning, there is moderate fluid distension of  the stomach Small volume abdominopelvic ascites. Aortic Atherosclerosis  Interim History / Subjective:  As outlined above under significant events   Micro Data:  4/27: SARS-CoV-2 PCR> negative 4/27: Influenza PCR>>negative 4/27: Respiratory panel>>negative 4/27: Blood culture x2>>negative  4/27: MRSA PCR>>negative  4/27: Strep pneumo urinary antigen>>negative  4/27: Legionella urinary antigen>> 4/27: Respiratory>> 4/27: BAL>>  Anti-infectives (From admission, onward)    Start     Dose/Rate Route Frequency Ordered Stop   04/01/23 0500  vancomycin (VANCOREADY) IVPB 500 mg/100 mL  Status:  Discontinued        500 mg 100 mL/hr over 60 Minutes Intravenous Every 24 hours 03/31/23 0643 04/01/23 1021   03/31/23 1800  ceFEPIme (MAXIPIME) 2 g in sodium chloride 0.9 % 100 mL IVPB        2 g 200 mL/hr over 30 Minutes Intravenous Every 12 hours 03/31/23 0643     03/31/23 0100  vancomycin (VANCOCIN) IVPB 1000 mg/200 mL premix        1,000 mg 200 mL/hr over 60 Minutes Intravenous  Once 03/31/23 0048 03/31/23 0220   03/31/23 0100  ceFEPIme (MAXIPIME) 2 g in sodium chloride 0.9 % 100 mL IVPB        2 g 200 mL/hr over 30 Minutes Intravenous  Once 03/31/23 0048 03/31/23 0201      OBJECTIVE  Blood pressure 110/83, pulse 96, temperature 98.4 F (36.9 C), resp. rate 20, height 5\' 1"  (1.549 m), weight 39.3 kg, SpO2 91 %.    Vent Mode: PRVC FiO2 (%):  [50 %-60 %] 50 % Set Rate:  [20 bmp] 20 bmp Vt Set:  [380 mL] 380 mL PEEP:  [5 cmH20] 5 cmH20 Plateau Pressure:  [11 cmH20-16 cmH20] 11 cmH20   Intake/Output Summary (Last 24 hours) at 04/01/2023 1052 Last data filed at 04/01/2023 1610 Gross per 24 hour  Intake 760.2 ml  Output 1275 ml  Net -514.8 ml   Filed Weights   03/31/23 0028 03/31/23 0046 04/01/23 0431  Weight: 40.5 kg 40.5 kg 39.3 kg   Physical  Examination  GENERAL: Acute on chronically-ill cachetic appearing female, NAD mechanical intubation  HEENT: Head atraumatic, normocephalic. No JVD. Orally intubated  LUNGS: Diminished throughout, even, non labored  CARDIOVASCULAR: NSR, s1s2, rrr, no m/r/g, 2+ radial/1+distal, no edema  ABDOMEN: +BS x4, soft, non distended  EXTREMITIES: Moves all extremities  NEUROLOGIC: Awake and slightly sedated, following commands, PERRLA SKIN: No obvious rash, lesion, or ulcer. Warm to touch Labs/imaging that I havepersonally reviewed  (right click and "Reselect all SmartList Selections" daily)     Labs   CBC: Recent Labs  Lab 03/29/23 1553 03/31/23 0046 04/01/23 0402  WBC 7.5 10.1 6.9  NEUTROABS 5.5 7.6  --   HGB 10.3* 10.0* 8.9*  HCT 30.6* 30.5* 26.6*  MCV 92.7 95.3 94.3  PLT 262 276 240    Basic Metabolic Panel: Recent Labs  Lab 03/29/23 1553 03/31/23 0046 03/31/23 0926 04/01/23 0402  NA 129* 130*  --  134*  K 3.8 4.0  --  5.3*  CL 87* 93*  --  99  CO2 33* 28  --  29  GLUCOSE 131* 150*  --  129*  BUN 17 20  --  20  CREATININE 0.65 0.50  --  0.45  CALCIUM 8.7* 8.2*  --  8.4*  MG  --  1.5* 2.0 1.9  PHOS  --   --   --  2.8   GFR: Estimated Creatinine Clearance: 40 mL/min (by C-G formula based  on SCr of 0.45 mg/dL). Recent Labs  Lab 03/29/23 1553 03/31/23 0046 03/31/23 0219 04/01/23 0402  PROCALCITON  --  0.32  --   --   WBC 7.5 10.1  --  6.9  LATICACIDVEN  --  2.3* 1.6  --     Liver Function Tests: Recent Labs  Lab 03/29/23 1553 03/31/23 0046  AST 40 34  ALT 15 12  ALKPHOS 75 66  BILITOT 0.3 0.5  PROT 7.0 6.7  ALBUMIN 3.1* 2.5*   No results for input(s): "LIPASE", "AMYLASE" in the last 168 hours. No results for input(s): "AMMONIA" in the last 168 hours.  ABG    Component Value Date/Time   PHART 7.4 03/31/2023 0028   PCO2ART 56 (H) 03/31/2023 0028   PO2ART 82 (L) 03/31/2023 0028   HCO3 34.7 (H) 03/31/2023 0028   O2SAT 97.9 03/31/2023 0028      Coagulation Profile: Recent Labs  Lab 03/31/23 0602  INR 1.0    Cardiac Enzymes: No results for input(s): "CKTOTAL", "CKMB", "CKMBINDEX", "TROPONINI" in the last 168 hours.  HbA1C: HB A1C (BAYER DCA - WAIVED)  Date/Time Value Ref Range Status  01/05/2020 03:31 PM 4.8 <7.0 % Final    Comment:                                          Diabetic Adult            <7.0                                       Healthy Adult        4.3 - 5.7                                                           (DCCT/NGSP) American Diabetes Association's Summary of Glycemic Recommendations for Adults with Diabetes: Hemoglobin A1c <7.0%. More stringent glycemic goals (A1c <6.0%) may further reduce complications at the cost of increased risk of hypoglycemia.   11/19/2017 01:41 PM 4.9 <7.0 % Final    Comment:                                          Diabetic Adult            <7.0                                       Healthy Adult        4.3 - 5.7                                                           (DCCT/NGSP) American Diabetes Association's Summary of Glycemic Recommendations for Adults with Diabetes: Hemoglobin A1c <7.0%. More stringent glycemic goals (  A1c <6.0%) may further reduce complications at the cost of increased risk of hypoglycemia.    Hgb A1c MFr Bld  Date/Time Value Ref Range Status  07/18/2022 01:12 AM 5.1 4.8 - 5.6 % Final    Comment:    (NOTE) Pre diabetes:          5.7%-6.4%  Diabetes:              >6.4%  Glycemic control for   <7.0% adults with diabetes     CBG: Recent Labs  Lab 03/31/23 0836 03/31/23 1118 03/31/23 1554 03/31/23 2309 04/01/23 0405  GLUCAP 146* 161* 185* 144* 114*    Review of Systems:   Unable to assess pt mechanically intubated   Past Medical History  She,  has a past medical history of Arthritis, Depression, Fibromyalgia, GERD (gastroesophageal reflux disease), H/O drug dependence (HCC), and Wears dentures.   Surgical History     Past Surgical History:  Procedure Laterality Date   BREAST SURGERY     Silicone implants, then removal   CATARACT EXTRACTION W/PHACO Left 05/06/2019   Procedure: CATARACT EXTRACTION PHACO AND INTRAOCULAR LENS PLACEMENT (IOC) LEFT;  Surgeon: Galen Manila, MD;  [facility];  Service: Ophthalmology;  Laterality: Left;   TUBAL LIGATION       Social History   reports that she has been smoking cigarettes. She has a 14.00 pack-year smoking history. She has never used smokeless tobacco. She reports that she does not currently use alcohol. She reports that she does not use drugs.   Family History   Her family history includes COPD in her sister; Suicidality in her father.   Allergies Allergies  Allergen Reactions   Gabapentin Anaphylaxis     Home Medications  Prior to Admission medications   Medication Sig Start Date End Date Taking? Authorizing Provider  albuterol (VENTOLIN HFA) 108 (90 Base) MCG/ACT inhaler Inhale 2 puffs into the lungs every 6 (six) hours as needed for wheezing or shortness of breath. Patient not taking: Reported on 03/29/2023 07/23/22   Kathrynn Running, MD  clonazePAM (KLONOPIN) 0.5 MG tablet Take by mouth.    [provider]  Fluticasone-Umeclidin-Vilant (TRELEGY ELLIPTA) 100-62.5-25 MCG/ACT AEPB Inhale 1 Inhalation into the lungs daily in the afternoon. Patient not taking: Reported on 03/29/2023 07/31/22   Olevia Perches P, DO  naloxone [facility]) nasal spray 4 mg/0.1 mL 1 spray into the nostril 1x, may repeat dose in alternate nostrils every 2-3 minutes until EMS arrives 03/29/23   Olevia Perches P, DO  oxyCODONE-acetaminophen (PERCOCET) 10-325 MG tablet Take 1 tablet by mouth every 6 (six) hours as needed for up to 5 days for pain. 03/29/23 04/03/23  Johnson, Megan P, DO  promethazine (PHENERGAN) 25 MG tablet TAKE 1/2 TABLET BY MOUTH EVERY 8 HOURS AS NEEDED FOR NAUSEA AND VOMITING 07/31/22   Olevia Perches P, DO  Scheduled Meds:  budesonide  (PULMICORT) nebulizer solution  0.25 mg Nebulization BID   Chlorhexidine Gluconate Cloth  6 each Topical Daily   clonazePAM  0.5 mg Per Tube BID   docusate  100 mg Per Tube BID   famotidine  20 mg Per Tube BID   heparin  5,000 Units Subcutaneous Q8H   ipratropium-albuterol  3 mL Nebulization Q6H   methylPREDNISolone (SOLU-MEDROL) injection  40 mg Intravenous Q12H   nicotine  21 mg Transdermal Daily   mouth rinse  15 mL Mouth Rinse Q2H   polyethylene glycol  17 g Per Tube Daily   QUEtiapine  25 mg Per Tube BID   Continuous Infusions:  sodium chloride Stopped (04/01/23 1610)   ceFEPime (MAXIPIME) IV 200 mL/hr at 04/01/23 0622   dexmedetomidine (PRECEDEX) IV infusion 0.9 mcg/kg/hr (04/01/23 0622)   fentaNYL infusion INTRAVENOUS 25 mcg/hr (04/01/23 0622)   norepinephrine (LEVOPHED) Adult infusion Stopped (03/31/23 0318)   PRN Meds:.docusate sodium, fentaNYL, ipratropium-albuterol, midazolam, ondansetron (ZOFRAN) IV, mouth rinse, polyethylene glycol   Active Hospital Problem list     Assessment & Plan:  #Acute on Chronic Hypoxic and Hypercapnic Respiratory Failure #Bilateral Pleural Effusion #Multifocal Pneumonia #Acute Exacerbation of COPD  Background emphysema and COPD, on triple therapy at home. Now with hypercapnic respiratory failure requiring intubation.  Repeat CT shows bilateral  dense consolidations concerning for multifocal pneumonia and pleural effusion and Extensive cystic/low-density adenopathy - Full vent support, implement lung protective strategies - Plateau pressures less than 30 cm H20 - Wean FiO2 & PEEP as tolerated to maintain O2 sats >92% - s/p bronchoscopy 03/31/23 - Follow intermittent Chest X-ray & ABG as needed - Spontaneous Breathing Trials when respiratory parameters met and mental status permits - Implement VAP Bundle - Bronchodilators and Pulmicort nebs - Increased iv steroid frequency to 40 mg bid   #Sepsis secondary to multifocal pneumonia meets  SIRS criteria: Heart Rate 112 beats/minute, Respiratory Rate 24 breaths/minute,Temperature 95.8 - Supplemental oxygen as needed, to maintain SpO2 > 90% - Trend WBC and monitor fever curve  - Trend PCT  - Abx as outlined above   #Elevated suspect secondary to demand ischemia  No dynamic EKG changes - Continuous telemetry monitoring  - Echo pending  - Troponin's peaked at 871  #Hyperkalemia  - Trend BMP - Replace electrolytes as indicated  - Monitor UOP   #Lymphadenopathy  #Mediastinal Mass CT chest, Abd/Pelvis shows extensive cystic/low-density right supraclavicular, mediastinal, hilar, upper abdominal and retroperitoneal adenopathy concerning for necrotic metastatic disease, infectious etiology such as tuberculous or nontuberculous mycobacterial infection, and lymphoproliferative disease. -myeloma panel, peripheral blood flow cytometry,  pending from prior work up on 4/25 per oncology  - PET Scan and Ultrasound Guided biopsy of right supraclavicular pending  #Hyperglycemia likely steroid induced  - CBG's q4hrs  - SSI   #Severe anxiety  #Mechanical ventilation pain/discomfort  - Maintain RASS goal 0 to -1 - PAD protocol to maintain RASS goal: Precedex and fentanyl gtts - Continue outpatient klonopin and will add seroquel   Best practice:  Diet:  NPO; Dietitian consulted to start TF's  Pain/Anxiety/Delirium protocol (if indicated): Yes (RASS goal -1) VAP protocol (if indicated): Yes DVT prophylaxis: Subcutaneous Heparin GI prophylaxis: H2B Glucose control:  SSI Yes Central venous access:  N/A Arterial line:  N/A Foley:  Yes, and it is still needed Mobility:  bed rest  PT consulted: N/A Last date of multidisciplinary goals of care discussion [04/01/2023] Code Status:  full code Disposition: ICU  Palliative consulted to assist with code status and goals of care  Critical care time: 40 minutes    Zada Girt, AGNP  Pulmonary/Critical Care Pager [phone] (please  enter 7 digits) PCCM Consult Pager [phone] (please enter 7 digits)

## 2023-04-01 NOTE — Progress Notes (Signed)
Palliative consult received.  Vanessa Oneal remains intubated and sedated. Spoke with niece-[name], family unable to visit today but have planned to meet at bedside 4/29 around 11:30 AM. Member from PMT will meet with family at that time for GOC discussion.  No Charge.  Leeanne Deed, DNP, AGNP-C Palliative Medicine  Please call Palliative Medicine team phone with any questions [phone]. For individual providers please see AMION.

## 2023-04-02 DIAGNOSIS — J9621 Acute and chronic respiratory failure with hypoxia: Secondary | ICD-10-CM | POA: Diagnosis not present

## 2023-04-02 DIAGNOSIS — J9601 Acute respiratory failure with hypoxia: Secondary | ICD-10-CM | POA: Diagnosis not present

## 2023-04-02 DIAGNOSIS — J189 Pneumonia, unspecified organism: Secondary | ICD-10-CM | POA: Diagnosis not present

## 2023-04-02 DIAGNOSIS — J9622 Acute and chronic respiratory failure with hypercapnia: Secondary | ICD-10-CM | POA: Diagnosis not present

## 2023-04-02 DIAGNOSIS — J441 Chronic obstructive pulmonary disease with (acute) exacerbation: Secondary | ICD-10-CM | POA: Diagnosis not present

## 2023-04-02 LAB — BASIC METABOLIC PANEL
Anion gap: 4 — ABNORMAL LOW (ref 5–15)
BUN: 23 mg/dL (ref 8–23)
CO2: 28 mmol/L (ref 22–32)
Calcium: 8.1 mg/dL — ABNORMAL LOW (ref 8.9–10.3)
Chloride: 100 mmol/L (ref 98–111)
Creatinine, Ser: 0.46 mg/dL (ref 0.44–1.00)
GFR, Estimated: >60 mL/min (ref >60)
Glucose, Bld: 147 mg/dL — ABNORMAL HIGH (ref 70–99)
Potassium: 4.7 mmol/L (ref 3.5–5.1)
Sodium: 132 mmol/L — ABNORMAL LOW (ref 135–145)

## 2023-04-02 LAB — CBC
HCT: 29.2 % — ABNORMAL LOW (ref 36.0–46.0)
Hemoglobin: 9.3 g/dL — ABNORMAL LOW (ref 12.0–15.0)
MCH: 30.5 pg (ref 26.0–34.0)
MCHC: 31.8 g/dL (ref 30.0–36.0)
MCV: 95.7 fL (ref 80.0–100.0)
Platelets: 274 10*3/uL (ref 150–400)
RBC: 3.05 MIL/uL — ABNORMAL LOW (ref 3.87–5.11)
RDW: 13.5 % (ref 11.5–15.5)
WBC: 7.4 10*3/uL (ref 4.0–10.5)
nRBC: 0 % (ref 0.0–0.2)

## 2023-04-02 LAB — ACID FAST SMEAR (AFB, MYCOBACTERIA): Acid Fast Smear: NEGATIVE

## 2023-04-02 LAB — COMP PANEL: LEUKEMIA/LYMPHOMA

## 2023-04-02 LAB — GLUCOSE, CAPILLARY
Glucose-Capillary: 128 mg/dL — ABNORMAL HIGH (ref 70–99)
Glucose-Capillary: 140 mg/dL — ABNORMAL HIGH (ref 70–99)
Glucose-Capillary: 170 mg/dL — ABNORMAL HIGH (ref 70–99)
Glucose-Capillary: 174 mg/dL — ABNORMAL HIGH (ref 70–99)
Glucose-Capillary: 184 mg/dL — ABNORMAL HIGH (ref 70–99)
Glucose-Capillary: 82 mg/dL (ref 70–99)

## 2023-04-02 LAB — PATHOLOGIST SMEAR REVIEW

## 2023-04-02 LAB — CULTURE, RESPIRATORY W GRAM STAIN: Culture: NO GROWTH

## 2023-04-02 LAB — MAGNESIUM: Magnesium: 1.7 mg/dL (ref 1.7–2.4)

## 2023-04-02 LAB — PHOSPHORUS: Phosphorus: 3.7 mg/dL (ref 2.5–4.6)

## 2023-04-02 LAB — CULTURE, BLOOD (ROUTINE X 2)

## 2023-04-02 MED ORDER — CHLORHEXIDINE GLUCONATE CLOTH 2 % EX PADS
6.0000 | MEDICATED_PAD | Freq: Every day | CUTANEOUS | Status: DC
  Administered 2023-04-02 – 2023-04-20 (×19): 6 via TOPICAL

## 2023-04-02 MED ORDER — PROPOFOL 1000 MG/100ML IV EMUL
5.0000 ug/kg/min | INTRAVENOUS | Status: DC
  Administered 2023-04-02: 30 ug/kg/min via INTRAVENOUS
  Administered 2023-04-02: 5 ug/kg/min via INTRAVENOUS
  Administered 2023-04-03: 60 ug/kg/min via INTRAVENOUS
  Administered 2023-04-03 (×2): 70 ug/kg/min via INTRAVENOUS
  Administered 2023-04-03: 80 ug/kg/min via INTRAVENOUS
  Administered 2023-04-04: 70 ug/kg/min via INTRAVENOUS
  Administered 2023-04-04: 80 ug/kg/min via INTRAVENOUS
  Administered 2023-04-04: 70 ug/kg/min via INTRAVENOUS
  Administered 2023-04-04 (×2): 80 ug/kg/min via INTRAVENOUS
  Administered 2023-04-05: 70 ug/kg/min via INTRAVENOUS
  Administered 2023-04-05: 80 ug/kg/min via INTRAVENOUS
  Filled 2023-04-02 (×13): qty 100

## 2023-04-02 MED ORDER — VITAL 1.5 CAL PO LIQD
1000.0000 mL | ORAL | Status: DC
  Administered 2023-04-02 – 2023-04-19 (×20): 1000 mL

## 2023-04-02 MED ORDER — ADULT MULTIVITAMIN LIQUID CH
15.0000 mL | Freq: Every day | ORAL | Status: DC
  Administered 2023-04-03 – 2023-04-20 (×17): 15 mL
  Filled 2023-04-02 (×18): qty 15

## 2023-04-02 MED ORDER — METHYLPREDNISOLONE SODIUM SUCC 40 MG IJ SOLR
40.0000 mg | INTRAMUSCULAR | Status: DC
  Administered 2023-04-03: 40 mg via INTRAVENOUS
  Filled 2023-04-02: qty 1

## 2023-04-02 NOTE — Progress Notes (Signed)
NAME:  Vanessa Oneal, MRN:  [id number], DOB:  1951/09/20, LOS: 2 ADMISSION DATE:  03/31/2023, CONSULTATION DATE: 03/31/2023 REFERRING MD: Chiquita Loth CHIEF COMPLAINT: Acute respiratory distress   HPI  72 y.o female w/ significant PMH of COPD, Emphysema, GERD, RA, Fibromyalgia, tobacco abuse, h/o drug abuse (cocaine). EtOH Abuse, Anxiety and Depression and recent diagnosis of chest wall mass concerning for Lymphadenopathy who presented to the ED with chief complaints of acute respiratory distress.  On review of chart, patient recently presented to [facility] emergency room on 03/27/2023 with complaints of chest pain.  CTA chest and CT abdomen pelvis with contrast was obtained which showed extensive mediastinal soft tissue mass and a large retroperitoneal soft tissue mass favored to represent lymphoma. Patient was referred to outpatient oncology for further evaluation.  Patient was evaluated by oncologist Dr. Cathie Hoops on 03/29/2023 who recommended a PET scan and ultrasound-guided biopsy of the right supraclavicular lymphadenopathy for further evaluation.  She also had myeloma panel and peripheral blood flow cytometric and LDH obtained.  Prior to presenting to the ED, patient had been complaining of progressive shortness of breath  all day and was found with oxygen saturation of 66% on room air.  She was placed on a nonrebreather and given Solu-Medrol 125 and 2 albuterol DuoNebs prior to transporting to the ED for further evaluation.   ED Course: Initial vital signs showed HR of 77 beats/minute, BP 90/60 mm Hg, the RR 22 breaths/minute, and the oxygen saturation 78% on NRB and a temperature of 98.15F (36.8C).  He was noted to be in acute respiratory distress with increased work of breathing.  Due to concerns for impending respiratory arrest, patient was intubated for airway protection.  Pertinent Labs/Diagnostics Findings: Na+/ K+:130/4.0 Glucose:150 BUN/Cr.:20/0.50   WBC:10 Hgb/Hct: 10.0/30.5  PCT: 0.32 Lactic  acid: 2.3 COVID PCR: Negative, Troponin:  BNP: 418.2  pO2 82; pCO2 56; pH 7.4;  HCO3 34.7, %O2 Sat 97.9.   Patient given 30 cc/kg of fluids and started on broad-spectrum antibiotics Vanco cefepime and Flagyl for sepsis. PCCM consulted for admission.  Past Medical History  COPD, Emphysema, GERD, RA, Fibromyalgia, tobacco abuse, h/o drug abuse (cocaine). EtOH Abuse, Anxiety and Depression and recent diagnosis of chest wall mass concerning for Lymphadenopathy  Significant Hospital Events   4/27: Admitted to ICU with acute on chronic hypoxic hypercapnic respiratory failure  due to multifocal pneumonia requiring mechanical ventilation.  Pt failed extubation requiring reintubation  4/28: Pt remains mechanically intubated vent settings: FiO2 50%/PEEP 5.  Pt with severe anxiety on precedex and fentanyl gtts will start antianxiety medications per tubes   Consults:  Oncology  Procedures:  4/27: Intubation  Significant Diagnostic Tests:  4/27: Chest Xray>> Endotracheal tube tip about 3.5 cm superior to carina. Esophageal tube tip below the diaphragm but incompletely visualized. Diffuse bilateral interstitial and hazy lung opacity, either representing edema or diffuse lung infection. More focal nodular opacity in the right mid lung may be infectious/ inflammatory or neoplastic in etiology. 4/27: CTA Chest/Abdomen/Pelvis>>Moderate left and small right pleural effusions. Emphysema. Diffuse bilateral septal thickening suggests underlying edema. Heterogeneous bilateral airspace consolidations throughout the lungs suspicious for multifocal pneumonia. Extensive cystic/low-density right supraclavicular, mediastinal, hilar, upper abdominal and retroperitoneal adenopathy. Differential considerations include necrotic metastatic disease, infectious etiology such as tuberculous or nontuberculous mycobacterial infection, and lymphoproliferative disease. Endotracheal tube tip about 1.2 cm superior to carina. Esophageal  tube tip just beyond GE junction, suggest further advancement for more optimal positioning, there is moderate fluid distension of  the stomach Small volume abdominopelvic ascites. Aortic Atherosclerosis  Interim History / Subjective:  Remains intubated Plan for thoracentesis Prognosis is guarded  Vent Mode: PRVC FiO2 (%):  [40 %-50 %] 40 % Set Rate:  [20 bmp] 20 bmp Vt Set:  [380 mL] 380 mL PEEP:  [5 cmH20] 5 cmH20 Plateau Pressure:  [10 cmH20-16 cmH20] 11 cmH20   Micro Data:  4/27: SARS-CoV-2 PCR> negative 4/27: Influenza PCR>>negative 4/27: Respiratory panel>>negative 4/27: Blood culture x2>>negative  4/27: MRSA PCR>>negative  4/27: Strep pneumo urinary antigen>>negative  4/27: Legionella urinary antigen>> 4/27: Respiratory>> 4/27: BAL>>  Anti-infectives (From admission, onward)    Start     Dose/Rate Route Frequency Ordered Stop   04/01/23 0500  vancomycin (VANCOREADY) IVPB 500 mg/100 mL  Status:  Discontinued        500 mg 100 mL/hr over 60 Minutes Intravenous Every 24 hours 03/31/23 0643 04/01/23 1021   03/31/23 1800  ceFEPIme (MAXIPIME) 2 g in sodium chloride 0.9 % 100 mL IVPB        2 g 200 mL/hr over 30 Minutes Intravenous Every 12 hours 03/31/23 0643     03/31/23 0100  vancomycin (VANCOCIN) IVPB 1000 mg/200 mL premix        1,000 mg 200 mL/hr over 60 Minutes Intravenous  Once 03/31/23 0048 03/31/23 0220   03/31/23 0100  ceFEPIme (MAXIPIME) 2 g in sodium chloride 0.9 % 100 mL IVPB        2 g 200 mL/hr over 30 Minutes Intravenous  Once 03/31/23 0048 03/31/23 0201      OBJECTIVE  Blood pressure 116/78, pulse 83, temperature 97.9 F (36.6 C), resp. rate 17, height 5\' 1"  (1.549 m), weight 37.7 kg, SpO2 93 %.    Vent Mode: PRVC FiO2 (%):  [40 %-50 %] 40 % Set Rate:  [20 bmp] 20 bmp Vt Set:  [380 mL] 380 mL PEEP:  [5 cmH20] 5 cmH20 Plateau Pressure:  [10 cmH20-16 cmH20] 11 cmH20   Intake/Output Summary (Last 24 hours) at 04/02/2023 0727 Last data filed at  04/02/2023 6578 Gross per 24 hour  Intake 1572.23 ml  Output 1740 ml  Net -167.77 ml    Filed Weights   03/31/23 0046 04/01/23 0431 04/02/23 0152  Weight: 40.5 kg 39.3 kg 37.7 kg     REVIEW OF SYSTEMS  PATIENT IS UNABLE TO PROVIDE COMPLETE REVIEW OF SYSTEMS DUE TO SEVERE CRITICAL ILLNESS   PHYSICAL EXAMINATION:  GENERAL:critically ill appearing, +resp distress EYES: Pupils equal, round, reactive to light.  No scleral icterus.  MOUTH: Moist mucosal membrane. INTUBATED NECK: Supple.  PULMONARY: Lungs clear to auscultation, +rhonchi, +wheezing CARDIOVASCULAR: S1 and S2.  Regular rate and rhythm GASTROINTESTINAL: Soft, nontender, -distended. Positive bowel sounds.  MUSCULOSKELETAL: No swelling, clubbing, or edema.  NEUROLOGIC: awake and alert SKIN:normal, warm to touch, Capillary refill delayed  Pulses present bilaterally        Labs/imaging that I havepersonally reviewed  (right click and "Reselect all SmartList Selections" daily)     Labs   CBC: Recent Labs  Lab 03/29/23 1553 03/31/23 0046 04/01/23 0402 04/02/23 0200  WBC 7.5 10.1 6.9 7.4  NEUTROABS 5.5 7.6  --   --   HGB 10.3* 10.0* 8.9* 9.3*  HCT 30.6* 30.5* 26.6* 29.2*  MCV 92.7 95.3 94.3 95.7  PLT 262 276 240 274     Basic Metabolic Panel: Recent Labs  Lab 03/29/23 1553 03/31/23 0046 03/31/23 0926 04/01/23 0402 04/01/23 1120 04/01/23 1412 04/01/23 1947 04/02/23 0200  NA  129* 130*  --  134*  --  133*  --  132*  K 3.8 4.0  --  5.3* 5.2* 5.1 5.2* 4.7  CL 87* 93*  --  99  --  99  --  100  CO2 33* 28  --  29  --  27  --  28  GLUCOSE 131* 150*  --  129*  --  131*  --  147*  BUN 17 20  --  20  --  19  --  23  CREATININE 0.65 0.50  --  0.45  --  0.52  --  0.46  CALCIUM 8.7* 8.2*  --  8.4*  --  8.6*  --  8.1*  MG  --  1.5* 2.0 1.9  --  1.7  --  1.7  PHOS  --   --   --  2.8  --  2.9  --  3.7    GFR: Estimated Creatinine Clearance: 38.4 mL/min (by C-G formula based on SCr of 0.46 mg/dL). Recent  Labs  Lab 03/29/23 1553 03/31/23 0046 03/31/23 0219 04/01/23 0402 04/02/23 0200  PROCALCITON  --  0.32  --   --   --   WBC 7.5 10.1  --  6.9 7.4  LATICACIDVEN  --  2.3* 1.6  --   --      Liver Function Tests: Recent Labs  Lab 03/29/23 1553 03/31/23 0046  AST 40 34  ALT 15 12  ALKPHOS 75 66  BILITOT 0.3 0.5  PROT 7.0 6.7  ALBUMIN 3.1* 2.5*    No results for input(s): "LIPASE", "AMYLASE" in the last 168 hours. No results for input(s): "AMMONIA" in the last 168 hours.  ABG    Component Value Date/Time   PHART 7.4 03/31/2023 0028   PCO2ART 56 (H) 03/31/2023 0028   PO2ART 82 (L) 03/31/2023 0028   HCO3 34.7 (H) 03/31/2023 0028   O2SAT 97.9 03/31/2023 0028     Coagulation Profile: Recent Labs  Lab 03/31/23 0602  INR 1.0     Cardiac Enzymes: No results for input(s): "CKTOTAL", "CKMB", "CKMBINDEX", "TROPONINI" in the last 168 hours.  HbA1C: HB A1C (BAYER DCA - WAIVED)  Date/Time Value Ref Range Status  01/05/2020 03:31 PM 4.8 <7.0 % Final    Comment:                                          Diabetic Adult            <7.0                                       Healthy Adult        4.3 - 5.7                                                           (DCCT/NGSP) American Diabetes Association's Summary of Glycemic Recommendations for Adults with Diabetes: Hemoglobin A1c <7.0%. More stringent glycemic goals (A1c <6.0%) may further reduce complications at the cost of increased risk of hypoglycemia.   11/19/2017 01:41 PM 4.9 <7.0 % Final  Comment:                                          Diabetic Adult            <7.0                                       Healthy Adult        4.3 - 5.7                                                           (DCCT/NGSP) American Diabetes Association's Summary of Glycemic Recommendations for Adults with Diabetes: Hemoglobin A1c <7.0%. More stringent glycemic goals (A1c <6.0%) may further reduce complications at the cost of  increased risk of hypoglycemia.    Hgb A1c MFr Bld  Date/Time Value Ref Range Status  04/01/2023 03:59 AM 6.2 (H) 4.8 - 5.6 % Final    Comment:    (NOTE) Pre diabetes:          5.7%-6.4%  Diabetes:              >6.4%  Glycemic control for   <7.0% adults with diabetes   07/18/2022 01:12 AM 5.1 4.8 - 5.6 % Final    Comment:    (NOTE) Pre diabetes:          5.7%-6.4%  Diabetes:              >6.4%  Glycemic control for   <7.0% adults with diabetes     CBG: Recent Labs  Lab 04/01/23 1146 04/01/23 1547 04/01/23 1924 04/01/23 2321 04/02/23 0356  GLUCAP 123* 146* 91 148* 82      Assessment & Plan:  Acute on Chronic Hypoxic and Hypercapnic Respiratory Failure/Bilateral Pleural Effusion/Multifocal Pneumonia Acute Exacerbation of COPD with mediastinal mass   Severe ACUTE Hypoxic and Hypercapnic Respiratory Failure -continue Mechanical Ventilator support -Wean Fio2 and PEEP as tolerated -VAP/VENT bundle implementation - Wean PEEP & FiO2 as tolerated, maintain SpO2 > 88% - Head of bed elevated 30 degrees, VAP protocol in place - Plateau pressures less than 30 cm H20  - Intermittent chest x-ray & ABG PRN - Ensure adequate pulmonary hygiene  -will perform SAT/SBT when all procedures completed   SEVERE COPD EXACERBATION -continue IV steroids as prescribed -continue NEB THERAPY as prescribed  Background emphysema and COPD, on triple therapy at home. Now with hypercapnic respiratory failure requiring intubation.  Repeat CT shows bilateral  dense consolidations concerning for multifocal pneumonia and pleural effusion and Extensive cystic/low-density adenopathy - s/p bronchoscopy 03/31/23 - Increased iv steroid frequency to 40 mg bid   SEPTIC shock SOURCE-PNEUMONIA -use vasopressors to keep MAP>65 as needed -follow ABG and LA as needed -follow up cultures -emperic ABX  INFECTIOUS DISEASE -continue antibiotics as prescribed -follow up cultures Sepsis secondary to  multifocal pneumonia meets SIRS criteria: Heart Rate 112 beats/minute, Respiratory Rate 24 breaths/minute,Temperature 95.8 - Supplemental oxygen as needed, to maintain SpO2 > 90% - Trend WBC and monitor fever curve  - Trend PCT  - Abx as outlined above   Elevated suspect secondary to demand  ischemia  No dynamic EKG changes - Continuous telemetry monitoring  - Echo pending  - Troponin's peaked at 871  Lymphadenopathy  Mediastinal Mass CT chest, Abd/Pelvis shows extensive cystic/low-density right supraclavicular, mediastinal, hilar, upper abdominal and retroperitoneal adenopathy concerning for necrotic metastatic disease, infectious etiology such as tuberculous or nontuberculous mycobacterial infection, and lymphoproliferative disease. -myeloma panel, peripheral blood flow cytometry,  pending from prior work up on 4/25 per oncology  - PET Scan and Ultrasound Guided biopsy of right supraclavicular pending   ENDO - ICU hypoglycemic\Hyperglycemia protocol -check FSBS per protocol   GI GI PROPHYLAXIS as indicated  NUTRITIONAL STATUS DIET-->TF's as tolerated Constipation protocol as indicated   ELECTROLYTES -follow labs as needed -replace as needed -pharmacy consultation and following   Best practice:  Diet:  NPO; Dietitian consulted to start TF's  Pain/Anxiety/Delirium protocol (if indicated): Yes (RASS goal -1) VAP protocol (if indicated): Yes DVT prophylaxis: Subcutaneous Heparin GI prophylaxis: H2B Glucose control:  SSI Yes Central venous access:  N/A Arterial line:  N/A Foley:  Yes, and it is still needed Mobility:  bed rest  PT consulted: N/A Last date of multidisciplinary goals of care discussion [04/01/2023] Code Status:  full code Disposition: ICU    DVT/GI PRX  assessed I Assessed the need for Labs I Assessed the need for Foley I Assessed the need for Central Venous Line Family Discussion when available I Assessed the need for Mobilization I made an  Assessment of medications to be adjusted accordingly Safety Risk assessment completed  CASE DISCUSSED IN MULTIDISCIPLINARY ROUNDS WITH ICU TEAM     Critical Care Time devoted to patient care services described in this note is 50 minutes.  Critical care was necessary to treat /prevent imminent and life-threatening deterioration.   Lucie Leather, M.D.  [facility]  Medical Director [facility] Medical Director [facility] Cardio-Pulmonary Department

## 2023-04-02 NOTE — Consult Note (Signed)
Consultation Note Date: 04/02/2023 at 1030  Patient Name: Vanessa Oneal  DOB: 1951-05-27  MRN: [id number]  Age / Sex: 72 y.o., female  PCP: Dorcas Carrow, DO Referring Physician: [name], MD  Reason for Consultation: Establishing goals of care  HPI/Patient Profile: 72 y.o. female  with past medical history of COPD, emphysema, GERD, RA, fibromyalgia, tobacco abuse, H/O drug abuse (cocaine), EtOH abuse, anxiety, and depression admitted on 03/31/2023 with acute respiratory distress.  Patient was recently diagnosed with chest wall mass concerning for lymphadenopathy.  Patient was seen by Dr. Cathie Hoops on 03/29/2023 who recommended a PET scan and ultrasound-guided biopsy of the right subsupraclavicular lymphadenopathy for further evaluation.  Patient also had myeloma panel and peripheral blood flow cytometric and LDH obtained.  PTA, patient endorses progressive SOB.  On presentation to ED, oxygen saturation was 66% on room air.  Given concerns for impending respiratory arrest, patient was intubated.  4/27 patient failed extubation and required reintubation.  4/28 patient remains mechanically intubated with severe anxiety on Precedex and fentanyl gtt.'s.  4/29 propofol initiated.  PMT was consulted to discuss goals of care.   Clinical Assessment and Goals of Care: I have reviewed medical records including EPIC notes, labs and imaging, assessed the patient and then met with patient's niece Vanessa Oneal at bedside to discuss diagnosis prognosis, GOC, EOL wishes, disposition and options.  I introduced Palliative Medicine as specialized medical care for people living with serious illness. It focuses on providing relief from the symptoms and stress of a serious illness. The goal is to improve quality of life for both the patient and the family.  We discussed a brief life review of the patient.  Vanessa Oneal says patient has  been married 5 times.  She never worked as her spouse is supportive to her.  Vanessa Oneal shares patient abused alcohol and drugs for significant portion of her life.    Vanessa Oneal also states patient has 2 children, Vanessa Oneal and Vanessa Oneal.  [name]'s contact information is on chart.  Vanessa Oneal does not know Vanessa Oneal's contact information as patient has not been in contact with him for over a year.  Vanessa Oneal shares she plans to try to find Vanessa Oneal and make him aware of patient's current medical situation.  As far as functional and nutritional status Sharol shares patient has had a poor appetite and had significant weight loss over the past 6 months.  She shares she has noticed the patient going down the hill over the past several months.  Vanessa Oneal has been living with the patient most recently and endorses patient has become more weak.  We discussed patient's current illness and what it means in the larger context of patient's on-going co-morbidities.  Education provided on COPD and mediastinal mass.  We discussed biopsy, PET scan, and potential for treatment options.  We also reviewed patient's functional, nutritional, and cognitive status is important indicators of overall prognosis in light of new cancer diagnosis.   I attempted to elicit values and goals of care important to the patient.  Vanessa Oneal  states patient has been staying for quite some time that she is "tired" and "ready to go on".    We discussed that patient has always said she does not like hospitals, has avoided doctors appointments, and would not wear her oxygen at home.  Vanessa Oneal states the patient is spunky and stubborn.  Vanessa Oneal and family were not aware of mass until most recently and were surprised to know the patient know several months before but made no mention of it to friends or family.  Family is facing treatment option decisions, advanced directive, and anticipatory care needs.  We discussed urgent need to know if patient is extubated whether or not she would be  accepting of reintubation if her respiratory status declined.  Vanessa Oneal firmly believes that patient probably would not want to be reintubated but Vanessa Oneal also appreciates that she is not the next of kin decision maker.  She shares patient has always said she would never want to live long-term on machines and would never want to be kept alive on machines.  Quality versus quantity of life discussed.   Later in the afternoon, I met with patient's son Vanessa Oneal, his Carmelina Noun, and his friends Deniece Portela and Mr. Archer Asa.  Medical update given.  Education provided on mechanical ventilator, tube feeds, propofol, fentanyl, Precedex, thoracentesis, and one-way comfort extubation.  Vanessa Oneal was unable to say whether he would want reintubation after extubation or if he would shift to comfort at that time.  He is requesting that patient not be extubated until tomorrow.  Questions and concerns were addressed.  I consulted with Dr. Belia Heman and conveyed above.  Thoracentesis and one-way extubation planned for tomorrow by 9 AM.  I attempted to let Vanessa Oneal know of this information.  No answer.  HIPAA compliant voicemail left.  I spoke with patient's niece Vanessa Oneal and let her know that thoracentesis and extubation are planned by 9 AM tomorrow as well.  PMT will continue to follow and support patient and family throughout her hospitalization.  Primary Decision Maker NEXT OF KIN  Physical Exam Vitals reviewed.  Constitutional:      General: She is not in acute distress.    Appearance: She is ill-appearing. She is not toxic-appearing.     Comments: Temporal wasting  HENT:     Head: Normocephalic.     Mouth/Throat:     Mouth: Mucous membranes are moist.  Cardiovascular:     Rate and Rhythm: Normal rate.     Pulses: Normal pulses.  Pulmonary:     Comments: MV Abdominal:     Palpations: Abdomen is soft.  Skin:    General: Skin is warm and dry.     Coloration: Skin is pale.     Palliative Assessment/Data:     Thank  you for this consult. Palliative medicine will continue to follow and assist holistically.   Time Total: 90 minutes Greater than 50%  of this time was spent counseling and coordinating care related to the above assessment and plan.  Signed by: Georgiann Cocker, DNP, FNP-BC Palliative Medicine    Please contact Palliative Medicine Team phone at [phone] for questions and concerns.  For individual provider: See Loretha Stapler

## 2023-04-02 NOTE — Progress Notes (Signed)
Pharmacy Antibiotic Note  Vanessa Oneal is a 72 y.o. female admitted on 03/31/2023 with acute on chronic respiratory failure and sepsis secondary to multifocal pneumonia. Pharmacy has been consulted for vancomycin and cefepime dosing. Patient received cefepime 2mg  and vancomycin 1g loading dose in ED  Plan: Antibiotics Day 3  Continue Cefepime 2g IV every 12 hours based on CrCl <31ml/min Plan for 7 days total   Pharmacy will continue to monitor and adjust doses as needed. Plan to order vancomycin levels as clinical indicated.   Height: 5\' 1"  (154.9 cm) Weight: 37.7 kg (83 lb 1.8 oz) IBW/kg (Calculated) : 47.8  Temp (24hrs), Avg:98.8 F (37.1 C), Min:97.3 F (36.3 C), Max:99.7 F (37.6 C)  Recent Labs  Lab 03/29/23 1553 03/31/23 0046 03/31/23 0219 04/01/23 0402 04/01/23 1412 04/02/23 0200  WBC 7.5 10.1  --  6.9  --  7.4  CREATININE 0.65 0.50  --  0.45 0.52 0.46  LATICACIDVEN  --  2.3* 1.6  --   --   --      Estimated Creatinine Clearance: 38.4 mL/min (by C-G formula based on SCr of 0.46 mg/dL).    Allergies  Allergen Reactions   Gabapentin Anaphylaxis    Antimicrobials this admission: 4/27 Vancomycin >> 4/28 4/27 cefepime >>    Microbiology results: 4/27 bronch cx: pending 4/27 BCx: NGTD 4/27 Respiratory PCR Panel: negative 4/27 MRSA PCR: Negative  Thank you for allowing pharmacy to be a part of this patient's care.  Bettey Costa, PharmD Clinical Pharmacist 04/02/2023 2:29 PM

## 2023-04-02 NOTE — Progress Notes (Signed)
Patient is using clipboard with paper to write and communicate at this time.  Patient is alert and able to communicate because of the low sedation.  Prior to the next extubation - patient is concerned about having the proper pain medications and anxiety medications like her scheduled Klonopin and Seroquel.  Plus, wants her valium added back after we tuen off the Precedex drip.

## 2023-04-02 NOTE — TOC CM/SW Note (Signed)
TOC following for potential disposition needs.  Kamaree Wheatley, CSW [phone]  

## 2023-04-02 NOTE — Progress Notes (Signed)
Initial Nutrition Assessment  DOCUMENTATION CODES:   Severe malnutrition in context of chronic illness  INTERVENTION:   Vital 1.5@40ml /hr continuous   Free water flushes 30ml q4 hours to maintain tube patency   Regimen provides 1440kcal/day, 65g/day protein and 96ml/day of free water   Liquid MVI daily via tube   Pt at high refeed risk; recommend monitor potassium, magnesium and phosphorus labs daily until stable  Daily weights   NUTRITION DIAGNOSIS:   Severe Malnutrition related to chronic illness (COPD, new cancer) as evidenced by severe fat depletion, severe muscle depletion.  GOAL:   Provide needs based on ASPEN/SCCM guidelines  MONITOR:   Vent status, Labs, Weight trends, TF tolerance, I & O's, Skin  REASON FOR ASSESSMENT:   Consult Enteral/tube feeding initiation and management  ASSESSMENT:   72 y.o female w/ significant PMH of COPD, Emphysema, GERD, RA, Fibromyalgia, tobacco abuse, h/o drug abuse (cocaine, opiate), EtOH Abuse, hep C, CAD, anxiety, depression and recent diagnosis of chest wall mass concerning for lymphadenopathy who is admitted with COPD exacerbation, PNA, sepsis and shock.  Pt remains sedated and ventilated. OGT in place. Tube feed protocol initiated over the weekend and pt is tolerating well at 12ml/hr. Refeed labs wnl. Per chart, pt is down ~6lbs since admission but appears to be back to her UBW. Pt - on her I & Os.   Medications reviewed and include: colace, pepcid, heparin, insulin, solu-medrol, nicotine, miralax, cefepime, precedex, propofol   Labs reviewed: Na 132(L), K 4.7 wnl, P 3.7 wnl, Mg 1.7 wnl BNP- 418.1(H) Hgb 9.3(L), Hct 29.2(L) Cbgs- 184, 174, 82 x 24 hrs  AIC 6.2(H)- 4/28   Patient is currently intubated on ventilator support MV: 8.4 L/min Temp (24hrs), Avg:98.8 F (37.1 C), Min:97.3 F (36.3 C), Max:99.7 F (37.6 C)  Propofol: 9.05 ml/hr- provides 239kcal/day   MAP- >37mmHg   UOP- 1728ml/day   NUTRITION -  FOCUSED PHYSICAL EXAM:  Flowsheet Row Most Recent Value  Orbital Region Severe depletion  Upper Arm Region Severe depletion  Thoracic and Lumbar Region Severe depletion  Buccal Region Severe depletion  Temple Region Severe depletion  Clavicle Bone Region Severe depletion  Clavicle and Acromion Bone Region Severe depletion  Scapular Bone Region Severe depletion  Dorsal Hand Severe depletion  Patellar Region Severe depletion  Anterior Thigh Region Severe depletion  Posterior Calf Region Severe depletion  Edema (RD Assessment) None  Hair Reviewed  Eyes Reviewed  Mouth Reviewed  Skin Reviewed  Nails Reviewed   Diet Order:   Diet Order             Diet NPO time specified  Diet effective now                  EDUCATION NEEDS:   No education needs have been identified at this time  Skin:  Skin Assessment: Reviewed RN Assessment (ecchymosis)  Last BM:  pta  Height:   Ht Readings from Last 1 Encounters:  03/31/23 5\' 1"  (1.549 m)    Weight:   Wt Readings from Last 1 Encounters:  04/02/23 37.7 kg    Ideal Body Weight:  47.7 kg  BMI:  Body mass index is 15.7 kg/m.  Estimated Nutritional Needs:   Kcal:  1200-1400kcal/day  Protein:  65-75g/day  Fluid:  1.0-1.2L/day  Betsey Holiday MS, RD, LDN Please refer to [facility] for RD and/or RD on-call/weekend/after hours pager

## 2023-04-02 NOTE — Consult Note (Signed)
PHARMACY CONSULT NOTE - FOLLOW UP  Pharmacy Consult for Electrolyte Monitoring and Replacement   Recent Labs: Potassium (mmol/L)  Date Value  04/02/2023 4.7  01/03/2014 3.6   Magnesium (mg/dL)  Date Value  40/98/1191 1.7  01/03/2014 1.9   Calcium (mg/dL)  Date Value  47/82/9562 8.1 (L)   Calcium, Total (mg/dL)  Date Value  13/07/6577 7.4 (L)   Albumin (g/dL)  Date Value  46/96/2952 2.5 (L)  07/31/2022 2.9 (L)  01/01/2014 3.2 (L)   Phosphorus (mg/dL)  Date Value  84/13/2440 3.7   Sodium (mmol/L)  Date Value  04/02/2023 132 (L)  07/31/2022 137  01/03/2014 139     Assessment: 72 y.o female w/ significant PMH of COPD, Emphysema, GERD, RA, Fibromyalgia, tobacco abuse, h/o drug abuse (cocaine). EtOH Abuse, Anxiety and Depression and recent diagnosis of chest wall mass concerning for Lymphadenopathy who presented to the ED with chief complaints of acute respiratory distress. Patient currently intubated and transferred to ICU. Pharmacy has been consulted to manage and replace electrolytes while under PCCM care.  Diet/Nutrition: NPO  Goal of Therapy:  Electrolytes WNL  Plan:  - no replacement currently indicated - Will recheck electrolytes tomorrow with AM labs  Bettey Costa ,PharmD Clinical Pharmacist 04/02/2023 7:59 AM

## 2023-04-02 NOTE — Telephone Encounter (Signed)
Images now available in Media

## 2023-04-02 NOTE — Progress Notes (Signed)
Dr. Belia Heman present and gave order to start propofol infusion. Patient restless, following commands. Thoracentesis ordered for today.

## 2023-04-03 ENCOUNTER — Inpatient Hospital Stay: Payer: 59

## 2023-04-03 DIAGNOSIS — J441 Chronic obstructive pulmonary disease with (acute) exacerbation: Secondary | ICD-10-CM | POA: Diagnosis not present

## 2023-04-03 DIAGNOSIS — J9621 Acute and chronic respiratory failure with hypoxia: Secondary | ICD-10-CM | POA: Diagnosis not present

## 2023-04-03 DIAGNOSIS — J9601 Acute respiratory failure with hypoxia: Secondary | ICD-10-CM | POA: Diagnosis not present

## 2023-04-03 DIAGNOSIS — J189 Pneumonia, unspecified organism: Secondary | ICD-10-CM | POA: Diagnosis not present

## 2023-04-03 DIAGNOSIS — J9622 Acute and chronic respiratory failure with hypercapnia: Secondary | ICD-10-CM | POA: Diagnosis not present

## 2023-04-03 LAB — BASIC METABOLIC PANEL
Anion gap: 6 (ref 5–15)
BUN: 23 mg/dL (ref 8–23)
CO2: 29 mmol/L (ref 22–32)
Calcium: 8.2 mg/dL — ABNORMAL LOW (ref 8.9–10.3)
Chloride: 98 mmol/L (ref 98–111)
Creatinine, Ser: 0.44 mg/dL (ref 0.44–1.00)
GFR, Estimated: >60 mL/min (ref >60)
Glucose, Bld: 142 mg/dL — ABNORMAL HIGH (ref 70–99)
Potassium: 4.3 mmol/L (ref 3.5–5.1)
Sodium: 133 mmol/L — ABNORMAL LOW (ref 135–145)

## 2023-04-03 LAB — CBC
HCT: 31.1 % — ABNORMAL LOW (ref 36.0–46.0)
Hemoglobin: 10 g/dL — ABNORMAL LOW (ref 12.0–15.0)
MCH: 31 pg (ref 26.0–34.0)
MCHC: 32.2 g/dL (ref 30.0–36.0)
MCV: 96.3 fL (ref 80.0–100.0)
Platelets: 334 10*3/uL (ref 150–400)
RBC: 3.23 MIL/uL — ABNORMAL LOW (ref 3.87–5.11)
RDW: 13.8 % (ref 11.5–15.5)
WBC: 9 10*3/uL (ref 4.0–10.5)
nRBC: 0 % (ref 0.0–0.2)

## 2023-04-03 LAB — MTB-RIF NAA W AFB CULT, NON-SPUTUM

## 2023-04-03 LAB — GLUCOSE, CAPILLARY
Glucose-Capillary: 119 mg/dL — ABNORMAL HIGH (ref 70–99)
Glucose-Capillary: 107 mg/dL — ABNORMAL HIGH (ref 70–99)
Glucose-Capillary: 163 mg/dL — ABNORMAL HIGH (ref 70–99)
Glucose-Capillary: 133 mg/dL — ABNORMAL HIGH (ref 70–99)
Glucose-Capillary: 73 mg/dL (ref 70–99)

## 2023-04-03 LAB — PHOSPHORUS: Phosphorus: 3 mg/dL (ref 2.5–4.6)

## 2023-04-03 LAB — LEGIONELLA PNEUMOPHILA SEROGP 1 UR AG: L. pneumophila Serogp 1 Ur Ag: NEGATIVE

## 2023-04-03 LAB — MAGNESIUM: Magnesium: 1.7 mg/dL (ref 1.7–2.4)

## 2023-04-03 LAB — TRIGLYCERIDES: Triglycerides: 84 mg/dL (ref <150)

## 2023-04-03 MED ORDER — METHYLPREDNISOLONE SODIUM SUCC 40 MG IJ SOLR
20.0000 mg | INTRAMUSCULAR | Status: DC
  Administered 2023-04-04 – 2023-04-09 (×6): 20 mg via INTRAVENOUS
  Filled 2023-04-03 (×6): qty 1

## 2023-04-03 MED ORDER — MAGNESIUM SULFATE 2 GM/50ML IV SOLN
2.0000 g | Freq: Once | INTRAVENOUS | Status: AC
  Administered 2023-04-03: 2 g via INTRAVENOUS
  Filled 2023-04-03: qty 50

## 2023-04-03 NOTE — IPAL (Signed)
  Interdisciplinary Goals of Care Family Meeting   Date carried out: 04/03/2023  Location of the meeting: Bedside  Member's involved: Physician, Bedside Registered Nurse, Family Member or next of kin, and Palliative care team member   GOALS OF CARE DISCUSSION  The Clinical status was relayed to family in detail-  Updated and notified of patients medical condition- Patient able to write and follow commands but in severe resp distress Patient with increased WOB and using accessory muscles to breathe Explained to family course of therapy and the modalities   Patient with Progressive multiorgan failure with a very high probablity of a very minimal chance of meaningful recovery despite all aggressive and optimal medical therapy.   She has written that she does not want to die, will plan for [facility] and PEG tube Son and Niece were present at time of discussion  Patient has severe end stage COPD, end stage sCHF and lung cancer(undiagnosed) at this time  Family understands the situation. Plan for [facility] and PEG  Case discussed with IR Doc DR Lowella Dandy will aslo plan for US guided biopsy of LAD  Family are satisfied with Plan of action and management. All questions answered  Additional CC time 45 mins   Vanessa Oneal, M.D.  [facility]  Medical Director [facility] Medical Director [facility] Cardio-Pulmonary Department

## 2023-04-03 NOTE — Progress Notes (Signed)
Palliative Care Progress Note, Assessment & Plan   Patient Name: Vanessa Oneal       Date: 04/03/2023 DOB: [birth year]  Age: 72 y.o. MRN#: [id number] Attending Physician: [name], MD Primary Care Physician: Dorcas Carrow, DO Admit Date: 03/31/2023  Subjective: Patient is sitting up in bed with ventilator in place.  She is awake and able to write on a piece of paper to convey her thoughts.  Her son [name] and niece [name] are at bedside.   HPI: 72 y.o. female  with past medical history of COPD, emphysema, GERD, RA, fibromyalgia, tobacco abuse, H/O drug abuse (cocaine), EtOH abuse, anxiety, and depression admitted on 03/31/2023 with acute respiratory distress.  Patient was recently diagnosed with chest wall mass concerning for lymphadenopathy.   Patient was seen by Dr. Cathie Hoops on 03/29/2023 who recommended a PET scan and ultrasound-guided biopsy of the right subsupraclavicular lymphadenopathy for further evaluation.  Patient also had myeloma panel and peripheral blood flow cytometric and LDH obtained.   PTA, patient endorses progressive SOB.  On presentation to ED, oxygen saturation was 66% on room air.  Given concerns for impending respiratory arrest, patient was intubated.   4/27 patient failed extubation and required reintubation.   4/28 patient remains mechanically intubated with severe anxiety on Precedex and fentanyl gtt.'s.   4/29 propofol initiated.   PMT was consulted to discuss goals of care.   Summary of counseling/coordination of care: After reviewing the patient's chart and assessing the patient at bedside, Dr. Belia Heman and I spoke with patient's son [name] and niece [name] outside of the patient's room to minimize patient's anxiety and worry while medical update given.   We discussed patient's  current medical situation.  Reviewed COPD, new lung mass, poor functional and nutritional status, ongoing tobacco use, and anxiety.  Questions and concerns were addressed.  Family shares that patient wants to live and does not want to have the tube removed.  Quality versus quantity of life discussed.  Family in agreement to allow medical team to discuss plan of care and options with patient without interruption.  Dr. Jeralene Huff and myself attempted to explain current medical situation to patient.  She wrote that "I do not want to die".  We discussed placing a tracheostomy and PEG as alternatives to current ventilatory support.  Patient nodded her head and was in agreement for placement of trach and PEG.  Family was also in agreement to move forward with trach and PEG.  Full code and full scope remain.  Patient winks, gave me a smile, and motion for a air high-five as we concluded the visit.  Family has PMT contact info and was encouraged to reach out with any palliative needs.  PMT will continue to follow and monitor the patient peripherally throughout her hospitalization.  Physical Exam Vitals reviewed.  Constitutional:      General: She is not in acute distress.    Appearance: She is ill-appearing.  HENT:     Head: Normocephalic.     Comments: Temporal wasting    Mouth/Throat:     Mouth: Mucous membranes are moist.  Eyes:     Pupils: Pupils are equal, round, and reactive to light.  Cardiovascular:  Rate and Rhythm: Tachycardia present.  Pulmonary:     Comments: MV in place Abdominal:     Palpations: Abdomen is soft.  Musculoskeletal:        General: Normal range of motion.  Skin:    General: Skin is warm and dry.  Neurological:     Mental Status: She is alert.             Total Time 50 minutes   Cate Oravec L. Manon Hilding, FNP-BC Palliative Medicine Team Team Phone # [phone]

## 2023-04-03 NOTE — Procedures (Signed)
IR asked to evaluate patient for left thoracentesis. Limited ultrasound examination revealed insufficient fluid to safely perform this procedure. No procedure performed. Dr. Grace Isaac made aware. Ordering provider made aware via epic chat. Images saved in Epic.  Alwyn Ren, [place] [phone] 04/03/2023, 3:59 PM

## 2023-04-03 NOTE — Consult Note (Signed)
Vanessa Oneal, Vanessa Oneal [id number] 06-Jan-1951 Vanessa Fulling, MD  Reason for Consult: Severe COPD emphysema and failure to extubate  HPI: Patient intubated for exacerbation of severe COPD and emphysema, patient has decided to proceed with tracheostomy ENT has been consulted for same.  Allergies:  Allergies  Allergen Reactions   Gabapentin Anaphylaxis    ROS: Review of systems normal other than 12 systems except per HPI.  PMH:  Past Medical History:  Diagnosis Date   Arthritis    RA   Depression    Fibromyalgia    GERD (gastroesophageal reflux disease)    H/O drug dependence (HCC)    opiods   Wears dentures    full upper    FH:  Family History  Problem Relation Age of Onset   Suicidality Father    COPD Sister     SH:  Social History   Socioeconomic History   Marital status: Widowed    Spouse name: Not on file   Number of children: Not on file   Years of education: Not on file   Highest education level: Not on file  Occupational History   Occupation: retired  Tobacco Use   Smoking status: Light Smoker    Packs/day: 0.25    Years: 56.00    Additional pack years: 0.00    Total pack years: 14.00    Types: Cigarettes    Last attempt to quit: 11/04/2019    Years since quitting: 3.4   Smokeless tobacco: Never   Tobacco comments:    1 pack with last a couple days   Vaping Use   Vaping Use: Former  Substance and Sexual Activity   Alcohol use: Not Currently   Drug use: No   Sexual activity: Not Currently    Birth control/protection: None  Other Topics Concern   Not on file  Social History Narrative   ** Merged History Encounter **       Social Determinants of Health   Financial Resource Strain: Low Risk  (07/31/2022)   Overall Financial Resource Strain (CARDIA)    Difficulty of Paying Living Expenses: Not hard at all  Food Insecurity: No Food Insecurity (03/31/2023)   Hunger Vital Sign    Worried About Running Out of Food in the Last Year: Never true    Ran  Out of Food in the Last Year: Never true  Transportation Needs: No Transportation Needs (03/31/2023)   PRAPARE - Administrator, Civil Service (Medical): No    Lack of Transportation (Non-Medical): No  Physical Activity: Insufficiently Active (07/31/2022)   Exercise Vital Sign    Days of Exercise per Week: 7 days    Minutes of Exercise per Session: 10 min  Stress: Stress Concern Present (07/31/2022)   [employer] of Occupational Health - Occupational Stress Questionnaire    Feeling of Stress : To some extent  Social Connections: Socially Isolated (07/31/2022)   Social Connection and Isolation Panel [NHANES]    Frequency of Communication with Friends and Family: Twice a week    Frequency of Social Gatherings with Friends and Family: Once a week    Attends Religious Services: Never    Database administrator or Organizations: Not on file    Attends Banker Meetings: Never    Marital Status: Widowed  Intimate Partner Violence: Not At Risk (03/31/2023)   Humiliation, Afraid, Rape, and Kick questionnaire    Fear of Current or Ex-Partner: No    Emotionally Abused: No    Physically  Abused: No    Sexually Abused: No    PSH:  Past Surgical History:  Procedure Laterality Date   BREAST SURGERY     Silicone implants, then removal   CATARACT EXTRACTION W/PHACO Left 05/06/2019   Procedure: CATARACT EXTRACTION PHACO AND INTRAOCULAR LENS PLACEMENT (IOC) LEFT;  Surgeon: Galen Manila, MD;  Location: [facility];  Service: Ophthalmology;  Laterality: Left;   TUBAL LIGATION      Physical  Exam: Patient intubated mildly sedated, tracheal tube in position.  The anterior nose appears normal external ears appear normal.  Anterior neck is thin there is no evidence of previous tracheotomy scar.  A/P: Severe COPD and emphysema with failure to extubate.  It is the patient's wish to proceed with tracheostomy tube placement.  Although she was mildly sedated we did explain  that to her today and she does wish to proceed.  We will check the OR schedule and try to find a time for tracheostomy tube placement in the next week.  I will let the ICU team know when that date has been chosen.   ICU time approximately 45 minutes    [name] 04/03/2023 5:24 PM

## 2023-04-03 NOTE — Progress Notes (Signed)
NAME:  Vanessa Oneal, MRN:  [id number], DOB:  1951-08-31, LOS: 3 ADMISSION DATE:  03/31/2023, CONSULTATION DATE: 03/31/2023 REFERRING MD: Chiquita Loth CHIEF COMPLAINT: Acute respiratory distress   HPI  72 y.o female w/ significant PMH of COPD, Emphysema, GERD, RA, Fibromyalgia, tobacco abuse, h/o drug abuse (cocaine). EtOH Abuse, Anxiety and Depression and recent diagnosis of chest wall mass concerning for Lymphadenopathy who presented to the ED with chief complaints of acute respiratory distress.  On review of chart, patient recently presented to [facility] emergency room on 03/27/2023 with complaints of chest pain.  CTA chest and CT abdomen pelvis with contrast was obtained which showed extensive mediastinal soft tissue mass and a large retroperitoneal soft tissue mass favored to represent lymphoma. Patient was referred to outpatient oncology for further evaluation.  Patient was evaluated by oncologist Dr. Cathie Hoops on 03/29/2023 who recommended a PET scan and ultrasound-guided biopsy of the right supraclavicular lymphadenopathy for further evaluation.  She also had myeloma panel and peripheral blood flow cytometric and LDH obtained.  Prior to presenting to the ED, patient had been complaining of progressive shortness of breath  all day and was found with oxygen saturation of 66% on room air.  She was placed on a nonrebreather and given Solu-Medrol 125 and 2 albuterol DuoNebs prior to transporting to the ED for further evaluation.   ED Course: Initial vital signs showed HR of 77 beats/minute, BP 90/60 mm Hg, the RR 22 breaths/minute, and the oxygen saturation 78% on NRB and a temperature of 98.64F (36.8C).  He was noted to be in acute respiratory distress with increased work of breathing.  Due to concerns for impending respiratory arrest, patient was intubated for airway protection.  Past Medical History  COPD, Emphysema, GERD, RA, Fibromyalgia, tobacco abuse, h/o drug abuse (cocaine). EtOH Abuse, Anxiety and  Depression and recent diagnosis of chest wall mass concerning for Lymphadenopathy  Significant Hospital Events   4/27: Admitted to ICU with acute on chronic hypoxic hypercapnic respiratory failure  due to multifocal pneumonia requiring mechanical ventilation.  Pt failed extubation requiring reintubation  4/28: Pt remains mechanically intubated vent settings: FiO2 50%/PEEP 5.  Pt with severe anxiety on precedex and fentanyl gtts will start antianxiety medications per tubes  4/29 severe COPD, alert, unable to wean from vent  Consults:  Oncology  Procedures:  4/27: Intubation  Significant Diagnostic Tests:  4/27: Chest Xray>> Endotracheal tube tip about 3.5 cm superior to carina. Esophageal tube tip below the diaphragm but incompletely visualized. Diffuse bilateral interstitial and hazy lung opacity, either representing edema or diffuse lung infection. More focal nodular opacity in the right mid lung may be infectious/ inflammatory or neoplastic in etiology. 4/27: CTA Chest/Abdomen/Pelvis>>Moderate left and small right pleural effusions. Emphysema. Diffuse bilateral septal thickening suggests underlying edema. Heterogeneous bilateral airspace consolidations throughout the lungs suspicious for multifocal pneumonia. Extensive cystic/low-density right supraclavicular, mediastinal, hilar, upper abdominal and retroperitoneal adenopathy. Differential considerations include necrotic metastatic disease, infectious etiology such as tuberculous or nontuberculous mycobacterial infection, and lymphoproliferative disease. Endotracheal tube tip about 1.2 cm superior to carina. Esophageal tube tip just beyond GE junction, suggest further advancement for more optimal positioning, there is moderate fluid distension of the stomach Small volume abdominopelvic ascites. Aortic Atherosclerosis  Interim History / Subjective:  Severe end stage COPD Severe sCHF EF 35% Remains intubated Plan for thoracentesis Prognosis  is poor +lung mass c/w lung cancer  Vent Mode: PRVC FiO2 (%):  [40 %] 40 % Set Rate:  [20 bmp] 20 bmp  Vt Set:  [380 mL] 380 mL PEEP:  [5 cmH20] 5 cmH20 Plateau Pressure:  [14 cmH20-16 cmH20] 14 cmH20   Micro Data:  4/27: SARS-CoV-2 PCR> negative 4/27: Influenza PCR>>negative 4/27: Respiratory panel>>negative 4/27: Blood culture x2>>negative  4/27: MRSA PCR>>negative  4/27: Strep pneumo urinary antigen>>negative  4/27: Legionella urinary antigen>> 4/27: Respiratory>> 4/27: BAL>>  Anti-infectives (From admission, onward)    Start     Dose/Rate Route Frequency Ordered Stop   04/01/23 0500  vancomycin (VANCOREADY) IVPB 500 mg/100 mL  Status:  Discontinued        500 mg 100 mL/hr over 60 Minutes Intravenous Every 24 hours 03/31/23 0643 04/01/23 1021   03/31/23 1800  ceFEPIme (MAXIPIME) 2 g in sodium chloride 0.9 % 100 mL IVPB        2 g 200 mL/hr over 30 Minutes Intravenous Every 12 hours 03/31/23 0643 04/07/23 1759   03/31/23 0100  vancomycin (VANCOCIN) IVPB 1000 mg/200 mL premix        1,000 mg 200 mL/hr over 60 Minutes Intravenous  Once 03/31/23 0048 03/31/23 0220   03/31/23 0100  ceFEPIme (MAXIPIME) 2 g in sodium chloride 0.9 % 100 mL IVPB        2 g 200 mL/hr over 30 Minutes Intravenous  Once 03/31/23 0048 03/31/23 0201      OBJECTIVE  Blood pressure 129/82, pulse (!) 111, temperature 99.3 F (37.4 C), resp. rate 15, height 5\' 1"  (1.549 m), weight 38.8 kg, SpO2 97 %.    Vent Mode: PRVC FiO2 (%):  [40 %] 40 % Set Rate:  [20 bmp] 20 bmp Vt Set:  [380 mL] 380 mL PEEP:  [5 cmH20] 5 cmH20 Plateau Pressure:  [14 cmH20-16 cmH20] 14 cmH20   Intake/Output Summary (Last 24 hours) at 04/03/2023 0735 Last data filed at 04/03/2023 0700 Gross per 24 hour  Intake 2217.97 ml  Output 2975 ml  Net -757.03 ml    Filed Weights   04/01/23 0431 04/02/23 0152 04/03/23 0308  Weight: 39.3 kg 37.7 kg 38.8 kg      REVIEW OF SYSTEMS  PATIENT IS UNABLE TO PROVIDE COMPLETE REVIEW  OF SYSTEMS DUE TO SEVERE CRITICAL ILLNESS   PHYSICAL EXAMINATION:  GENERAL:critically ill appearing, +resp distress thin frail cachectic EYES: Pupils equal, round, reactive to light.  No scleral icterus.  MOUTH: Moist mucosal membrane. INTUBATED NECK: Supple.  PULMONARY: Lungs clear to auscultation, +rhonchi, +wheezing CARDIOVASCULAR: S1 and S2.  Regular rate and rhythm GASTROINTESTINAL: Soft, nontender, -distended. Positive bowel sounds.  MUSCULOSKELETAL: No swelling, clubbing, or edema.  NEUROLOGIC: obtunded,sedated SKIN:normal, warm to touch, Capillary refill delayed  Pulses present bilaterally     Labs   CBC: Recent Labs  Lab 03/29/23 1553 03/31/23 0046 04/01/23 0402 04/02/23 0200 04/03/23 0302  WBC 7.5 10.1 6.9 7.4 9.0  NEUTROABS 5.5 7.6  --   --   --   HGB 10.3* 10.0* 8.9* 9.3* 10.0*  HCT 30.6* 30.5* 26.6* 29.2* 31.1*  MCV 92.7 95.3 94.3 95.7 96.3  PLT 262 276 240 274 334     Basic Metabolic Panel: Recent Labs  Lab 03/31/23 0046 03/31/23 0926 04/01/23 0402 04/01/23 1120 04/01/23 1412 04/01/23 1947 04/02/23 0200 04/03/23 0302  NA 130*  --  134*  --  133*  --  132* 133*  K 4.0  --  5.3* 5.2* 5.1 5.2* 4.7 4.3  CL 93*  --  99  --  99  --  100 98  CO2 28  --  29  --  27  --  28 29  GLUCOSE 150*  --  129*  --  131*  --  147* 142*  BUN 20  --  20  --  19  --  23 23  CREATININE 0.50  --  0.45  --  0.52  --  0.46 0.44  CALCIUM 8.2*  --  8.4*  --  8.6*  --  8.1* 8.2*  MG 1.5* 2.0 1.9  --  1.7  --  1.7 1.7  PHOS  --   --  2.8  --  2.9  --  3.7 3.0    GFR: Estimated Creatinine Clearance: 39.5 mL/min (by C-G formula based on SCr of 0.44 mg/dL). Recent Labs  Lab 03/31/23 0046 03/31/23 0219 04/01/23 0402 04/02/23 0200 04/03/23 0302  PROCALCITON 0.32  --   --   --   --   WBC 10.1  --  6.9 7.4 9.0  LATICACIDVEN 2.3* 1.6  --   --   --      Liver Function Tests: Recent Labs  Lab 03/29/23 1553 03/31/23 0046  AST 40 34  ALT 15 12  ALKPHOS 75 66   BILITOT 0.3 0.5  PROT 7.0 6.7  ALBUMIN 3.1* 2.5*    No results for input(s): "LIPASE", "AMYLASE" in the last 168 hours. No results for input(s): "AMMONIA" in the last 168 hours.  ABG    Component Value Date/Time   PHART 7.4 03/31/2023 0028   PCO2ART 56 (H) 03/31/2023 0028   PO2ART 82 (L) 03/31/2023 0028   HCO3 34.7 (H) 03/31/2023 0028   O2SAT 97.9 03/31/2023 0028     Coagulation Profile: Recent Labs  Lab 03/31/23 0602  INR 1.0     Cardiac Enzymes: No results for input(s): "CKTOTAL", "CKMB", "CKMBINDEX", "TROPONINI" in the last 168 hours.  HbA1C: HB A1C (BAYER DCA - WAIVED)  Date/Time Value Ref Range Status  01/05/2020 03:31 PM 4.8 <7.0 % Final    Comment:                                          Diabetic Adult            <7.0                                       Healthy Adult        4.3 - 5.7                                                           (DCCT/NGSP) American Diabetes Association's Summary of Glycemic Recommendations for Adults with Diabetes: Hemoglobin A1c <7.0%. More stringent glycemic goals (A1c <6.0%) may further reduce complications at the cost of increased risk of hypoglycemia.   11/19/2017 01:41 PM 4.9 <7.0 % Final    Comment:                                          Diabetic Adult            <7.0  Healthy Adult        4.3 - 5.7                                                           (DCCT/NGSP) American Diabetes Association's Summary of Glycemic Recommendations for Adults with Diabetes: Hemoglobin A1c <7.0%. More stringent glycemic goals (A1c <6.0%) may further reduce complications at the cost of increased risk of hypoglycemia.    Hgb A1c MFr Bld  Date/Time Value Ref Range Status  04/01/2023 03:59 AM 6.2 (H) 4.8 - 5.6 % Final    Comment:    (NOTE) Pre diabetes:          5.7%-6.4%  Diabetes:              >6.4%  Glycemic control for   <7.0% adults with diabetes   07/18/2022 01:12 AM 5.1 4.8 -  5.6 % Final    Comment:    (NOTE) Pre diabetes:          5.7%-6.4%  Diabetes:              >6.4%  Glycemic control for   <7.0% adults with diabetes     CBG: Recent Labs  Lab 04/02/23 1143 04/02/23 1636 04/02/23 1937 04/02/23 2335 04/03/23 0337  GLUCAP 184* 170* 128* 140* 119*      Assessment & Plan:  Acute on Chronic Hypoxic and Hypercapnic Respiratory Failure/Bilateral Pleural Effusion/Multifocal Pneumonia Acute Exacerbation of COPD with mediastinal mass likely lung cancer  Severe ACUTE Hypoxic and Hypercapnic Respiratory Failure -continue Mechanical Ventilator support -Wean Fio2 and PEEP as tolerated -VAP/VENT bundle implementation - Wean PEEP & FiO2 as tolerated, maintain SpO2 > 88% - Head of bed elevated 30 degrees, VAP protocol in place - Plateau pressures less than 30 cm H20  - Intermittent chest x-ray & ABG PRN - Ensure adequate pulmonary hygiene  -will perform SAT/SBT when respiratory parameters are met   SEVERE COPD EXACERBATION -continue IV steroids as prescribed -continue NEB THERAPY as prescribed  Background emphysema and COPD, on triple therapy at home. Now with hypercapnic respiratory failure requiring intubation.  Repeat CT shows bilateral  dense consolidations concerning for multifocal pneumonia and pleural effusion and Extensive cystic/low-density adenopathy - s/p bronchoscopy 03/31/23 - Increased iv steroid frequency to 40 mg bid    SEPTIC shock SOURCE-PNEUMONIA -use vasopressors to keep MAP>65 as needed -follow ABG and LA as needed -follow up cultures -emperic ABX   INFECTIOUS DISEASE -continue antibiotics as prescribed -follow up cultures Sepsis secondary to multifocal pneumonia meets SIRS criteria: Heart Rate 112 beats/minute, Respiratory Rate 24 breaths/minute,Temperature 95.8   Elevated suspect secondary to demand ischemia  No dynamic EKG changes - Continuous telemetry monitoring  - Echo pending  - Troponin's peaked at  871   Lymphadenopathy  Mediastinal Mass CT chest, Abd/Pelvis shows extensive cystic/low-density right supraclavicular, mediastinal, hilar, upper abdominal and retroperitoneal adenopathy concerning for necrotic metastatic disease, infectious etiology such as tuberculous or nontuberculous mycobacterial infection, and lymphoproliferative disease. -myeloma panel, peripheral blood flow cytometry,  pending from prior work up on 4/25 per oncology  - PET Scan and Ultrasound Guided biopsy of right supraclavicular pending   ENDO - ICU hypoglycemic\Hyperglycemia protocol -check FSBS per protocol   GI GI PROPHYLAXIS as indicated  NUTRITIONAL STATUS DIET-->TF's as tolerated Constipation protocol as indicated  ELECTROLYTES -follow labs as needed -replace as needed -pharmacy consultation and following    Best practice:  Diet:  NPO; Dietitian consulted to start TF's  Pain/Anxiety/Delirium protocol (if indicated): Yes (RASS goal -1) VAP protocol (if indicated): Yes DVT prophylaxis: Subcutaneous Heparin GI prophylaxis: H2B Glucose control:  SSI Yes Central venous access:  N/A Arterial line:  N/A Foley:  Yes, and it is still needed Mobility:  bed rest  PT consulted: N/A Last date of multidisciplinary goals of care discussion [04/01/2023] Code Status:  full code Disposition: ICU     DVT/GI PRX  assessed I Assessed the need for Labs I Assessed the need for Foley I Assessed the need for Central Venous Line Family Discussion when available I Assessed the need for Mobilization I made an Assessment of medications to be adjusted accordingly Safety Risk assessment completed  CASE DISCUSSED IN MULTIDISCIPLINARY ROUNDS WITH ICU TEAM     Critical Care Time devoted to patient care services described in this note is 55 minutes.  Critical care was necessary to treat /prevent imminent and life-threatening deterioration. Overall, patient is critically ill, prognosis is guarded.  Patient  with Multiorgan failure and at high risk for cardiac arrest and death.    Lucie Leather, M.D.  [facility]  Medical Director [facility] Medical Director [facility] Cardio-Pulmonary Department

## 2023-04-03 NOTE — Consult Note (Signed)
PHARMACY CONSULT NOTE  Pharmacy Consult for Electrolyte Monitoring and Replacement   Recent Labs: Potassium (mmol/L)  Date Value  04/03/2023 4.3  01/03/2014 3.6   Magnesium (mg/dL)  Date Value  14/78/2956 1.7  01/03/2014 1.9   Calcium (mg/dL)  Date Value  21/30/8657 8.2 (L)   Calcium, Total (mg/dL)  Date Value  84/69/6295 7.4 (L)   Albumin (g/dL)  Date Value  28/41/3244 2.5 (L)  07/31/2022 2.9 (L)  01/01/2014 3.2 (L)   Phosphorus (mg/dL)  Date Value  12/06/7251 3.0   Sodium (mmol/L)  Date Value  04/03/2023 133 (L)  07/31/2022 137  01/03/2014 139     Assessment: 72 y.o female w/ significant PMH of COPD, Emphysema, GERD, RA, Fibromyalgia, tobacco abuse, h/o drug abuse (cocaine). EtOH Abuse, Anxiety and Depression and recent diagnosis of chest wall mass concerning for Lymphadenopathy who presented to the ED with chief complaints of acute respiratory distress. Patient currently intubated and transferred to ICU. Pharmacy has been consulted to manage and replace electrolytes while under PCCM care.  Diet/Nutrition: Vital 1.5 at 40 mL/hr  Goal of Therapy:  Electrolytes WNL  Plan:  - 2 grams IV magnesium sulfate x 1 - Will recheck electrolytes tomorrow with AM labs  Lowella Bandy ,PharmD Clinical Pharmacist 04/03/2023 6:58 AM

## 2023-04-04 ENCOUNTER — Inpatient Hospital Stay: Payer: 59 | Admitting: Radiology

## 2023-04-04 DIAGNOSIS — J9621 Acute and chronic respiratory failure with hypoxia: Secondary | ICD-10-CM | POA: Diagnosis not present

## 2023-04-04 DIAGNOSIS — J9622 Acute and chronic respiratory failure with hypercapnia: Secondary | ICD-10-CM | POA: Diagnosis not present

## 2023-04-04 HISTORY — PX: IR GASTROSTOMY TUBE MOD SED: IMG625

## 2023-04-04 HISTORY — PX: IR US LIVER BIOPSY: IMG936

## 2023-04-04 LAB — CBC
HCT: 31.3 % — ABNORMAL LOW (ref 36.0–46.0)
Hemoglobin: 10.4 g/dL — ABNORMAL LOW (ref 12.0–15.0)
MCH: 31.5 pg (ref 26.0–34.0)
MCHC: 33.2 g/dL (ref 30.0–36.0)
MCV: 94.8 fL (ref 80.0–100.0)
Platelets: 331 10*3/uL (ref 150–400)
RBC: 3.3 MIL/uL — ABNORMAL LOW (ref 3.87–5.11)
RDW: 14 % (ref 11.5–15.5)
WBC: 9 10*3/uL (ref 4.0–10.5)
nRBC: 0 % (ref 0.0–0.2)

## 2023-04-04 LAB — BASIC METABOLIC PANEL
Anion gap: 6 (ref 5–15)
BUN: 17 mg/dL (ref 8–23)
CO2: 31 mmol/L (ref 22–32)
Calcium: 8.6 mg/dL — ABNORMAL LOW (ref 8.9–10.3)
Chloride: 97 mmol/L — ABNORMAL LOW (ref 98–111)
Creatinine, Ser: 0.4 mg/dL — ABNORMAL LOW (ref 0.44–1.00)
GFR, Estimated: >60 mL/min (ref >60)
Glucose, Bld: 116 mg/dL — ABNORMAL HIGH (ref 70–99)
Potassium: 4.2 mmol/L (ref 3.5–5.1)
Sodium: 134 mmol/L — ABNORMAL LOW (ref 135–145)

## 2023-04-04 LAB — GLUCOSE, CAPILLARY
Glucose-Capillary: 103 mg/dL — ABNORMAL HIGH (ref 70–99)
Glucose-Capillary: 114 mg/dL — ABNORMAL HIGH (ref 70–99)
Glucose-Capillary: 70 mg/dL (ref 70–99)
Glucose-Capillary: 81 mg/dL (ref 70–99)
Glucose-Capillary: 92 mg/dL (ref 70–99)
Glucose-Capillary: 95 mg/dL (ref 70–99)
Glucose-Capillary: 97 mg/dL (ref 70–99)

## 2023-04-04 LAB — MULTIPLE MYELOMA PANEL, SERUM
Albumin SerPl Elph-Mcnc: 2.8 g/dL — ABNORMAL LOW (ref 2.9–4.4)
Albumin/Glob SerPl: 0.8 (ref 0.7–1.7)
Alpha 1: 0.4 g/dL (ref 0.0–0.4)
Alpha2 Glob SerPl Elph-Mcnc: 1 g/dL (ref 0.4–1.0)
B-Globulin SerPl Elph-Mcnc: 1 g/dL (ref 0.7–1.3)
Gamma Glob SerPl Elph-Mcnc: 1.2 g/dL (ref 0.4–1.8)
Globulin, Total: 3.6 g/dL (ref 2.2–3.9)
IgA: 387 mg/dL (ref 64–422)
IgG (Immunoglobin G), Serum: 1187 mg/dL (ref 586–1602)
IgM (Immunoglobulin M), Srm: 140 mg/dL (ref 26–217)
Total Protein ELP: 6.4 g/dL (ref 6.0–8.5)

## 2023-04-04 LAB — PHOSPHORUS: Phosphorus: 3.7 mg/dL (ref 2.5–4.6)

## 2023-04-04 LAB — MAGNESIUM: Magnesium: 2 mg/dL (ref 1.7–2.4)

## 2023-04-04 LAB — PROTIME-INR
INR: 1 (ref 0.8–1.2)
Prothrombin Time: 12.9 seconds (ref 11.4–15.2)

## 2023-04-04 LAB — FUNGUS CULTURE WITH STAIN

## 2023-04-04 LAB — CULTURE, BLOOD (ROUTINE X 2): Special Requests: ADEQUATE

## 2023-04-04 MED ORDER — MIDAZOLAM HCL 2 MG/2ML IJ SOLN
INTRAMUSCULAR | Status: AC
  Filled 2023-04-04: qty 2

## 2023-04-04 MED ORDER — IOHEXOL 300 MG/ML  SOLN
15.0000 mL | Freq: Once | INTRAMUSCULAR | Status: AC | PRN
  Administered 2023-04-04: 15 mL

## 2023-04-04 MED ORDER — HEPARIN SODIUM (PORCINE) 5000 UNIT/ML IJ SOLN
5000.0000 [IU] | Freq: Three times a day (TID) | INTRAMUSCULAR | Status: AC
  Administered 2023-04-04 – 2023-04-09 (×16): 5000 [IU] via SUBCUTANEOUS
  Filled 2023-04-04 (×16): qty 1

## 2023-04-04 MED ORDER — LIDOCAINE-EPINEPHRINE 1 %-1:100000 IJ SOLN
20.0000 mL | Freq: Once | INTRAMUSCULAR | Status: AC
  Administered 2023-04-04: 20 mL via INTRADERMAL

## 2023-04-04 MED ORDER — STERILE WATER FOR INJECTION IJ SOLN
INTRAMUSCULAR | Status: AC
  Filled 2023-04-04: qty 20

## 2023-04-04 MED ORDER — GLUCAGON HCL RDNA (DIAGNOSTIC) 1 MG IJ SOLR
INTRAMUSCULAR | Status: AC
  Filled 2023-04-04: qty 1

## 2023-04-04 MED ORDER — LIDOCAINE-EPINEPHRINE 1 %-1:100000 IJ SOLN
INTRAMUSCULAR | Status: AC
  Filled 2023-04-04: qty 1

## 2023-04-04 NOTE — Progress Notes (Signed)
                                                     Palliative Care Progress Note   Patient Name: KONYA FAUBLE       Date: 04/04/2023 DOB: 1951-07-20  Age: 72 y.o. MRN#: [id number] Attending Physician: [name], MD Primary Care Physician: Dorcas Carrow, DO Admit Date: 03/31/2023  Chart reviewed. I counselled with attending Dr. Belia Heman in regards to plan of care. Biopsy planned for today and trach placement planned for next Tuesday.    No acute palliative needs today.   Plan remains for full code and full scope.  PMT will monitor patient peripherally and remain available to patient and family.  Please re-engage with PMT if goals change, at patient/family's request, or if patient's health deteriorates during hospitalization.  Samara Deist L. Manon Hilding, FNP-BC Palliative Medicine Team Team Phone # [phone]  No charge

## 2023-04-04 NOTE — Consult Note (Signed)
PHARMACY CONSULT NOTE  Pharmacy Consult for Electrolyte Monitoring and Replacement   Recent Labs: Potassium (mmol/L)  Date Value  04/04/2023 4.2  01/03/2014 3.6   Magnesium (mg/dL)  Date Value  16/09/9603 2.0  01/03/2014 1.9   Calcium (mg/dL)  Date Value  54/08/8118 8.6 (L)   Calcium, Total (mg/dL)  Date Value  14/78/2956 7.4 (L)   Albumin (g/dL)  Date Value  21/30/8657 2.5 (L)  07/31/2022 2.9 (L)  01/01/2014 3.2 (L)   Phosphorus (mg/dL)  Date Value  84/69/6295 3.7   Sodium (mmol/L)  Date Value  04/04/2023 134 (L)  07/31/2022 137  01/03/2014 139     Assessment: 72 y.o female w/ significant PMH of COPD, Emphysema, GERD, RA, Fibromyalgia, tobacco abuse, h/o drug abuse (cocaine). EtOH Abuse, Anxiety and Depression and recent diagnosis of chest wall mass concerning for Lymphadenopathy who presented to the ED with chief complaints of acute respiratory distress. Patient currently intubated and transferred to ICU. Pharmacy has been consulted to manage and replace electrolytes while under PCCM care.  Diet/Nutrition: Vital 1.5 at 40 mL/hr  Goal of Therapy:  Electrolytes WNL  Plan:  - no electrolyte replacement warranted for today - Will recheck electrolytes tomorrow with AM labs  Lowella Bandy ,PharmD Clinical Pharmacist 04/04/2023 7:12 AM

## 2023-04-04 NOTE — Consult Note (Signed)
Chief Complaint: Patient was seen in consultation today for respiratory failure  at the request of Dr. Belia Heman, Dr. Cathie Hoops  Referring Physician(s): Dr. Belia Heman, Dr. Cathie Hoops  Supervising Physician: Pernell Dupre  Patient Status: [facility]  History of Present Illness: Vanessa Oneal is a 72 y.o. female who presented to ED with respiratory distress 03/31/2023 and was subsequently intubated.  She remains mechanically intubated on Precedex and fentanyl gtt.  Patient is followed by oncology for chest wall mass with unintentional weight loss over 7 to 8 months, night sweats and poor appetite. Patient was referred to IR for gastrostomy tube placement for nutritional support and supraclavicular lymph node biopsy for suspicion of lymphoma.  Imaging was reviewed and approved by Dr. Lowella Dandy.   Past Medical History:  Diagnosis Date   Arthritis    RA   Depression    Fibromyalgia    GERD (gastroesophageal reflux disease)    H/O drug dependence (HCC)    opiods   Wears dentures    full upper    Past Surgical History:  Procedure Laterality Date   BREAST SURGERY     Silicone implants, then removal   CATARACT EXTRACTION W/PHACO Left 05/06/2019   Procedure: CATARACT EXTRACTION PHACO AND INTRAOCULAR LENS PLACEMENT (IOC) LEFT;  Surgeon: Galen Manila, MD;  Location: [facility];  Service: Ophthalmology;  Laterality: Left;   TUBAL LIGATION      Allergies: Gabapentin  Medications: Prior to Admission medications   Medication Sig Start Date End Date Taking? Authorizing Provider  naloxone [facility]) nasal spray 4 mg/0.1 mL 1 spray into the nostril 1x, may repeat dose in alternate nostrils every 2-3 minutes until EMS arrives 03/29/23  Yes Johnson, Megan P, DO  promethazine (PHENERGAN) 25 MG tablet TAKE 1/2 TABLET BY MOUTH EVERY 8 HOURS AS NEEDED FOR NAUSEA AND VOMITING 07/31/22  Yes Johnson, Megan P, DO  albuterol (VENTOLIN HFA) 108 (90 Base) MCG/ACT inhaler Inhale 2 puffs into the lungs every 6  (six) hours as needed for wheezing or shortness of breath. Patient not taking: Reported on 03/29/2023 07/23/22   Kathrynn Running, MD  clonazePAM (KLONOPIN) 0.5 MG tablet Take by mouth.    [provider]  Fluticasone-Umeclidin-Vilant (TRELEGY ELLIPTA) 100-62.5-25 MCG/ACT AEPB Inhale 1 Inhalation into the lungs daily in the afternoon. Patient not taking: Reported on 03/29/2023 07/31/22   Dorcas Carrow, DO     Family History  Problem Relation Age of Onset   Suicidality Father    COPD Sister     Social History   Socioeconomic History   Marital status: Widowed    Spouse name: Not on file   Number of children: Not on file   Years of education: Not on file   Highest education level: Not on file  Occupational History   Occupation: retired  Tobacco Use   Smoking status: Light Smoker    Packs/day: 0.25    Years: 56.00    Additional pack years: 0.00    Total pack years: 14.00    Types: Cigarettes    Last attempt to quit: 11/04/2019    Years since quitting: 3.4   Smokeless tobacco: Never   Tobacco comments:    1 pack with last a couple days   Vaping Use   Vaping Use: Former  Substance and Sexual Activity   Alcohol use: Not Currently   Drug use: No   Sexual activity: Not Currently    Birth control/protection: None  Other Topics Concern   Not  on file  Social History Narrative   ** Merged History Encounter **       Social Determinants of Health   Financial Resource Strain: Low Risk  (07/31/2022)   Overall Financial Resource Strain (CARDIA)    Difficulty of Paying Living Expenses: Not hard at all  Food Insecurity: No Food Insecurity (03/31/2023)   Hunger Vital Sign    Worried About Running Out of Food in the Last Year: Never true    Ran Out of Food in the Last Year: Never true  Transportation Needs: No Transportation Needs (03/31/2023)   PRAPARE - Administrator, Civil Service (Medical): No    Lack of Transportation (Non-Medical): No  Physical Activity:  Insufficiently Active (07/31/2022)   Exercise Vital Sign    Days of Exercise per Week: 7 days    Minutes of Exercise per Session: 10 min  Stress: Stress Concern Present (07/31/2022)   [employer] of Occupational Health - Occupational Stress Questionnaire    Feeling of Stress : To some extent  Social Connections: Socially Isolated (07/31/2022)   Social Connection and Isolation Panel [NHANES]    Frequency of Communication with Friends and Family: Twice a week    Frequency of Social Gatherings with Friends and Family: Once a week    Attends Religious Services: Never    Database administrator or Organizations: Not on file    Attends Banker Meetings: Never    Marital Status: Widowed     Review of Systems: A 12 point ROS discussed and pertinent positives are indicated in the HPI above.  All other systems are negative.  Review of Systems  Unable to perform ROS: Acuity of condition    Vital Signs: BP 101/70 (BP Location: Left Arm)   Pulse (!) 111   Temp 99.3 F (37.4 C) (Bladder)   Resp 20   Ht 5\' 1"  (1.549 m)   Wt 86 lb 3.2 oz (39.1 kg)   SpO2 97%   BMI 16.29 kg/m     Physical Exam Vitals reviewed.  Constitutional:      Appearance: She is ill-appearing.  Cardiovascular:     Rate and Rhythm: Regular rhythm. Tachycardia present.  Pulmonary:     Effort: Pulmonary effort is normal. No respiratory distress.     Breath sounds: Normal breath sounds.     Comments: Mechanically ventilated Abdominal:     General: Bowel sounds are normal.     Palpations: Abdomen is soft.  Musculoskeletal:     Right lower leg: No edema.     Left lower leg: No edema.  Skin:    General: Skin is warm and dry.     Imaging: [place] CHEST (PLEURAL EFFUSION)  Result Date: 04/03/2023 CLINICAL DATA:  Concern for pleural effusions. Please perform chest ultrasound and ultrasound-guided thoracentesis as indicated. EXAM: CHEST ULTRASOUND COMPARISON:  Chest radiograph-03/24/2023; chest  CT-03/31/2023 FINDINGS: Sonographic evaluation of the chest demonstrates trace bilateral pleural effusions, too small to allow for safe ultrasound-guided thoracentesis. No thoracentesis attempted. IMPRESSION: Trace bilateral pleural effusions, too small to allow for safe ultrasound-guided thoracentesis. No thoracentesis attempted. Electronically Signed   By: Simonne Come M.D.   On: 04/03/2023 16:03   DG Chest Port 1 View  Result Date: 04/01/2023 CLINICAL DATA:  Dyspnea EXAM: PORTABLE CHEST 1 VIEW COMPARISON:  03/31/2023 FINDINGS: Endotracheal tube and gastric catheter are noted in satisfactory position. Right-sided PICC is seen with the catheter tip at the cavoatrial junction. The lungs are well aerated bilaterally. Diffuse  airspace opacities are again identified similar to that seen on the prior exam. No new focal infiltrate is noted. IMPRESSION: Tubes and lines as described above. Stable airspace opacities bilaterally. Electronically Signed   By: Alcide Clever M.D.   On: 04/01/2023 21:37   DG Abd 1 View  Result Date: 04/01/2023 CLINICAL DATA:  Abdominal pain EXAM: ABDOMEN - 1 VIEW COMPARISON:  Abdominal x-ray 03/31/2023 FINDINGS: Enteric tube tip is in the gastric body. Bowel-gas pattern is nonobstructive. Rectal catheter in place. Stool burden is average. There is no evidence for free air on this supine view. No acute fractures are identified. There is scoliosis and degenerative change of the lumbar spine, unchanged. IMPRESSION: 1. Enteric tube tip is in the gastric body. 2. Nonobstructive bowel gas pattern. Electronically Signed   By: Darliss Cheney M.D.   On: 04/01/2023 15:20   ECHOCARDIOGRAM COMPLETE  Result Date: 04/01/2023    ECHOCARDIOGRAM REPORT   Patient Name:   QUINTAVIA ROGSTAD Date of Exam: 04/01/2023 Medical Rec #:  [id number]        Height:       61.0 in Accession #:    [id number]       Weight:       86.6 lb Date of Birth:  10/02/1951       BSA:          1.324 m Patient Age:    71 years          BP:           107/76 mmHg Patient Gender: F                HR:           83 bpm. Exam Location:  [place] Procedure: 2D Echo, Cardiac Doppler and Color Doppler Indications:     CHF-Acute Diastolic I50.31  History:         Patient has no prior history of Echocardiogram examinations.                  CAD and Aortic atherosclerosis.  Sonographer:     Louie Boston RDCS Referring Phys:  [id number] [facility] Diagnosing Phys: Julien Nordmann MD IMPRESSIONS  1. Left ventricular ejection fraction, by estimation, is 35 to 40%. Left ventricular ejection fraction by PLAX is 32 %. The left ventricle has moderately decreased function. The left ventricle demonstrates global hypokinesis. Unable to exclude regional wall motion abnormality. Left ventricular diastolic parameters are consistent with Grade I diastolic dysfunction (impaired relaxation).  2. Right ventricular systolic function is normal. The right ventricular size is normal. There is normal pulmonary artery systolic pressure.  3. The mitral valve is normal in structure. Mild mitral valve regurgitation. No evidence of mitral stenosis.  4. The aortic valve is normal in structure. Aortic valve regurgitation is mild to moderate. Aortic valve sclerosis is present, with no evidence of aortic valve stenosis.  5. The inferior vena cava is normal in size with greater than 50% respiratory variability, suggesting right atrial pressure of 3 mmHg. FINDINGS  Left Ventricle: Left ventricular ejection fraction, by estimation, is 35 to 40%. Left ventricular ejection fraction by PLAX is 32 %. The left ventricle has moderately decreased function. The left ventricle demonstrates global hypokinesis. Definity contrast agent was given IV to delineate the left ventricular endocardial borders. The left ventricular internal cavity size was normal in size. There is no left ventricular hypertrophy. Left ventricular diastolic parameters are consistent with Grade I diastolic dysfunction  (impaired  relaxation). Right Ventricle: The right ventricular size is normal. No increase in right ventricular wall thickness. Right ventricular systolic function is normal. There is normal pulmonary artery systolic pressure. The tricuspid regurgitant velocity is 2.14 m/s, and  with an assumed right atrial pressure of 15 mmHg, the estimated right ventricular systolic pressure is 33.3 mmHg. Left Atrium: Left atrial size was normal in size. Right Atrium: Right atrial size was normal in size. Pericardium: There is no evidence of pericardial effusion. Mitral Valve: The mitral valve is normal in structure. Mild mitral valve regurgitation. No evidence of mitral valve stenosis. Tricuspid Valve: The tricuspid valve is normal in structure. Tricuspid valve regurgitation is mild . No evidence of tricuspid stenosis. Aortic Valve: The aortic valve is normal in structure. Aortic valve regurgitation is mild to moderate. Aortic regurgitation PHT measures 434 msec. Aortic valve sclerosis is present, with no evidence of aortic valve stenosis. Pulmonic Valve: The pulmonic valve was normal in structure. Pulmonic valve regurgitation is not visualized. No evidence of pulmonic stenosis. Aorta: The aortic root is normal in size and structure. Venous: The inferior vena cava is normal in size with greater than 50% respiratory variability, suggesting right atrial pressure of 3 mmHg. IAS/Shunts: No atrial level shunt detected by color flow Doppler.  LEFT VENTRICLE PLAX 2D LV EF:         Left            Diastology                ventricular     LV e' medial:    8.70 cm/s                ejection        LV E/e' medial:  9.0                fraction by     LV e' lateral:   9.46 cm/s                PLAX is 32      LV E/e' lateral: 8.3                %. LVIDd:         3.40 cm LVIDs:         2.90 cm LV PW:         0.90 cm LV IVS:        0.90 cm LVOT diam:     1.90 cm LV SV:         46 LV SV Index:   34 LVOT Area:     2.84 cm  LV Volumes (MOD) LV vol  d, MOD    48.7 ml A2C: LV vol d, MOD    68.5 ml A4C: LV vol s, MOD    23.6 ml A2C: LV vol s, MOD    39.8 ml A4C: LV SV MOD A2C:   25.1 ml LV SV MOD A4C:   68.5 ml LV SV MOD BP:    28.3 ml RIGHT VENTRICLE            IVC RV Basal diam:  2.80 cm    IVC diam: 2.40 cm RV S prime:     8.81 cm/s TAPSE (M-mode): 1.9 cm LEFT ATRIUM             Index        RIGHT ATRIUM           Index LA diam:  2.90 cm 2.19 cm/m   RA Area:     10.60 cm LA Vol (A2C):   23.5 ml 17.75 ml/m  RA Volume:   20.90 ml  15.79 ml/m LA Vol (A4C):   20.2 ml 15.26 ml/m LA Biplane Vol: 21.7 ml 16.39 ml/m  AORTIC VALVE LVOT Vmax:   94.05 cm/s LVOT Vmean:  61.300 cm/s LVOT VTI:    0.161 m AI PHT:      434 msec  AORTA Ao Root diam: 3.10 cm Ao Asc diam:  3.10 cm Ao Desc diam: 2.00 cm MITRAL VALVE               TRICUSPID VALVE MV Area (PHT): 5.13 cm    TR Peak grad:   18.3 mmHg MV Decel Time: 148 msec    TR Vmax:        214.00 cm/s MV E velocity: 78.10 cm/s MV A velocity: 78.60 cm/s  SHUNTS MV E/A ratio:  0.99        Systemic VTI:  0.16 m                            Systemic Diam: 1.90 cm Julien Nordmann MD Electronically signed by Julien Nordmann MD Signature Date/Time: 04/01/2023/2:45:46 PM    Final    [place] EKG SITE RITE  Result Date: 04/01/2023 If Site Rite image not attached, placement could not be confirmed due to current cardiac rhythm.  DG Chest 1 View  Result Date: 03/31/2023 CLINICAL DATA:  [id number] Endotracheally intubated [id number] EXAM: CHEST  1 VIEW COMPARISON:  chest CT 12/30/2022, checks x-ray 07/22/2022 FINDINGS: Endotracheal tube terminating 1.4 cm above the carina. Enteric tube coursing below the hemidiaphragm with tip and side port collimated off view. The heart and mediastinal contours are unchanged. Aortic calcification. No focal consolidation. Chronic coarsened markings with superimposed increased interstitial markings with overlying patchy airspace opacity. Bilateral trace pleural effusions. No pneumothorax. No acute osseous  abnormality. IMPRESSION: 1. Endotracheal tube with tip 1.4 cm above the carina. Consider retracting by 1 cm. 2. Enteric tube coursing below the hemidiaphragm with tip and side port collimated off view. 3. Pulmonary edema with superimposed multifocal infection better evaluated on CT chest 03/31/2023. Followup PA and lateral chest X-ray is recommended in 3-4 weeks following therapy to ensure resolution and exclude underlying malignancy. 4. Bilateral trace pleural effusions. 5. Aortic Atherosclerosis (ICD10-I70.0) and Emphysema (ICD10-J43.9). Electronically Signed   By: Tish Frederickson M.D.   On: 03/31/2023 20:26   DG Abd 1 View  Result Date: 03/31/2023 CLINICAL DATA:  Check gastric catheter placement EXAM: ABDOMEN - 1 VIEW COMPARISON:  Film from earlier in the same day. FINDINGS: Gastric catheter is noted extending into the stomach. No free air is seen. Scattered large and small bowel gas is noted. Degenerative changes of lumbar spine are noted. IMPRESSION: Gastric catheter within the stomach. Electronically Signed   By: Alcide Clever M.D.   On: 03/31/2023 20:21   DG Chest Port 1 View  Result Date: 03/31/2023 CLINICAL DATA:  Check endotracheal tube placement EXAM: PORTABLE CHEST 1 VIEW COMPARISON:  03/31/2023 FINDINGS: Cardiac shadow is stable. Endotracheal tube and gastric catheter are again noted in stable position. Aortic calcifications are seen. Bilateral airspace opacities are again identified slightly increased when compared with the prior exam likely related to increasing edema. No sizable effusion is noted. No pneumothorax is seen. IMPRESSION: Increasing airspace opacities bilaterally likely representing progressive edema. Tubes and lines in satisfactory position.  Electronically Signed   By: Alcide Clever M.D.   On: 03/31/2023 20:20   CT CHEST ABDOMEN PELVIS W CONTRAST  Result Date: 03/31/2023 CLINICAL DATA:  Sepsis retroperitoneal soft tissue mass EXAM: CT CHEST, ABDOMEN, AND PELVIS WITH CONTRAST  TECHNIQUE: Multidetector CT imaging of the chest, abdomen and pelvis was performed following the standard protocol during bolus administration of intravenous contrast. RADIATION DOSE REDUCTION: This exam was performed according to the departmental dose-optimization program which includes automated exposure control, adjustment of the mA and/or kV according to patient size and/or use of iterative reconstruction technique. CONTRAST:  OMNIPAQUE IOHEXOL 300 MG/ML  SOLN COMPARISON:  Chest x-ray 03/31/2023, chest CT 07/18/2022 FINDINGS: CT CHEST FINDINGS Cardiovascular: Moderate aortic atherosclerosis. Normal cardiac size. No pericardial effusion. Mediastinum/Nodes: Endotracheal tube tip about 1.2 cm superior to the carina. Esophageal tube tip in the proximal stomach slightly beyond the GE junction. Negative for thyroid mass. Extensive cystic/necrotic appearing adenopathy. Right supraclavicular node measures 2.8 cm, series 2, image 12. Numerous enlarged mediastinal and hilar nodes. Lobulated mass suspected to represent conglomerate adenopathy in the precarinal space, this measures 5.3 by 2.9 cm. Subcarinal nodes measuring up to 2.2 cm. Lungs/Pleura: Moderate left and small right pleural effusions. Emphysema. Diffuse bilateral septal thickening. Bilateral bronchial wall thickening. Heterogeneous consolidations throughout the bilateral lungs suspicious for multifocal infection. Musculoskeletal: No acute or suspicious osseous abnormality. CT ABDOMEN PELVIS FINDINGS Hepatobiliary: No focal hepatic abnormality. No calcified gallstone or biliary dilatation. Periportal edema. Pancreas: No pancreatic inflammatory changes. Spleen: Normal in size without focal abnormality. Adrenals/Urinary Tract: Adrenal glands are unremarkable. Kidneys are normal, without renal calculi, focal lesion, or hydronephrosis. Bladder contains Foley catheter. Stomach/Bowel: Moderate fluid distension of the stomach. No dilated small bowel. No acute  bowel wall thickening. Vascular/Lymphatic: Moderate aortic atherosclerosis. No aneurysm. Lobulated low-density mass/masses within the upper abdomen within the gastrohepatic region, this measures approximately 7.8 by 5.8 cm. Additional low-density masses adjacent to the dominant mass. This appears to abut but not arise from the pancreas. Retroperitoneal lymph nodes measuring up to 1.6 cm also with central necrosis. Reproductive: Negative for adnexal mass. Other: No free air.  Small volume abdominopelvic ascites. Musculoskeletal: No acute or suspicious osseous abnormality. IMPRESSION: 1. Moderate left and small right pleural effusions. Emphysema. Diffuse bilateral septal thickening suggests underlying edema. Heterogeneous bilateral airspace consolidations throughout the lungs suspicious for multifocal pneumonia. 2. Extensive cystic/low-density right supraclavicular, mediastinal, hilar, upper abdominal and retroperitoneal adenopathy. Differential considerations include necrotic metastatic disease, infectious etiology such as tuberculous or nontuberculous mycobacterial infection, and lymphoproliferative disease. 3. Endotracheal tube tip about 1.2 cm superior to carina. Esophageal tube tip just beyond GE junction, suggest further advancement for more optimal positioning, there is moderate fluid distension of the stomach 4. Small volume abdominopelvic ascites. Aortic Atherosclerosis (ICD10-I70.0) and Emphysema (ICD10-J43.9). Electronically Signed   By: Jasmine Pang M.D.   On: 03/31/2023 03:15   DG Chest Port 1 View  Result Date: 03/31/2023 CLINICAL DATA:  Shortness of breath history of tumor on chest EXAM: PORTABLE CHEST 1 VIEW COMPARISON:  CT 07/18/2022, chest x-ray 07/21/2022 FINDINGS: Endotracheal tube tip about 3.5 cm superior to carina. Esophageal tube tip below the diaphragm but incompletely visualized. Emphysema. Diffuse bilateral interstitial and hazy lung opacity. More focal nodular opacity in the right mid  lung. Cardiac size is normal. Aortic atherosclerosis. No pneumothorax emphysema. IMPRESSION: 1. Endotracheal tube tip about 3.5 cm superior to carina. Esophageal tube tip below the diaphragm but incompletely visualized. 2. Diffuse bilateral interstitial and hazy lung opacity, either  representing edema or diffuse lung infection. More focal nodular opacity in the right mid lung may be infectious/inflammatory or neoplastic in etiology. Electronically Signed   By: Jasmine Pang M.D.   On: 03/31/2023 01:05    Labs:  CBC: Recent Labs    04/01/23 0402 04/02/23 0200 04/03/23 0302 04/04/23 0408  WBC 6.9 7.4 9.0 9.0  HGB 8.9* 9.3* 10.0* 10.4*  HCT 26.6* 29.2* 31.1* 31.3*  PLT 240 274 334 331    COAGS: Recent Labs    07/17/22 2206 03/31/23 0602 04/04/23 0408  INR 1.2 1.0 1.0  APTT 31  --   --     BMP: Recent Labs    04/01/23 1412 04/01/23 1947 04/02/23 0200 04/03/23 0302 04/04/23 0408  NA 133*  --  132* 133* 134*  K 5.1 5.2* 4.7 4.3 4.2  CL 99  --  100 98 97*  CO2 27  --  28 29 31   GLUCOSE 131*  --  147* 142* 116*  BUN 19  --  23 23 17   CALCIUM 8.6*  --  8.1* 8.2* 8.6*  CREATININE 0.52  --  0.46 0.44 0.40*  GFRNONAA >60  --  >60 >60 >60    LIVER FUNCTION TESTS: Recent Labs    07/18/22 0429 07/31/22 1140 03/29/23 1553 03/31/23 0046  BILITOT 1.0 0.2 0.3 0.5  AST 21 17 40 34  ALT 14 13 15 12   ALKPHOS 77 99 75 66  PROT 5.1* 6.1 7.0 6.7  ALBUMIN 1.8* 2.9* 3.1* 2.5*    TUMOR MARKERS: No results for input(s): "AFPTM", "CEA", "CA199", "CHROMGRNA" in the last 8760 hours.  Assessment and Plan:  72 year old female with PMHx of COPD, emphysema, GERD, RA, fibromyalgia, tobacco abuse, substance abuse, EtOH abuse, anxiety and depression presents to IR for gastrostomy tube placement and supraclavicular lymph node biopsy.  Risks and benefits of/left supraclavicular lymph node biopsy was discussed with the patient's niece including, but not limited to bleeding, infection,  damage to adjacent structures or low yield requiring additional tests.  Risks and benefits image guided gastrostomy tube placement was discussed with the patient's niece including, but not limited to the need for a barium enema during the procedure, bleeding, infection, peritonitis and/or damage to adjacent structures.  All of the patient's questions were answered, patient is agreeable to proceed.  Consent signed and in chart.  All of the patient's nieces questions were answered and there is agreement to proceed.  Consent signed and in chart.  Patient mechanically ventilated and sedated. She is in no distress. Per RN, tube feeds were held at midnight. SQ heparin held as of yesterday.    Thank you for this interesting consult.  I greatly enjoyed meeting Vanessa Oneal and look forward to participating in their care.  A copy of this report was sent to the requesting provider on this date.  Electronically Signed: Shon Hough, NP 04/04/2023, 7:43 AM   I spent a total of 20 minutes in face to face in clinical consultation, greater than 50% of which was counseling/coordinating care for respiratory failure.

## 2023-04-04 NOTE — Progress Notes (Signed)
Nutrition Follow Up Note   DOCUMENTATION CODES:   Severe malnutrition in context of chronic illness  INTERVENTION:   Once G-tube in place and ready for use:  Resume Vital 1.5@40ml /hr continuous   Free water flushes 30ml q4 hours to maintain tube patency   Regimen provides 1440kcal/day, 65g/day protein and 962ml/day of free water   Liquid MVI daily via tube   NUTRITION DIAGNOSIS:   Severe Malnutrition related to chronic illness (COPD, new cancer) as evidenced by severe fat depletion, severe muscle depletion. -ongoing   GOAL:   Provide needs based on ASPEN/SCCM guidelines -met   MONITOR:   Vent status, Labs, Weight trends, TF tolerance, I & O's, Skin  ASSESSMENT:   72 y.o female w/ significant PMH of COPD, Emphysema, GERD, RA, Fibromyalgia, tobacco abuse, h/o drug abuse (cocaine, opiate), EtOH Abuse, hep C, CAD, anxiety, depression and recent diagnosis of chest wall mass concerning for lymphadenopathy who is admitted with new CHF, COPD exacerbation, PNA, sepsis and shock.  Pt remains sedated and ventilated. OGT in place. Tube feeds on hold for planned G-tube and LN biopsy today. Will resume tube feeds once appropriate. Refeed labs stable. No BM since admission; pt is noted to have an average stool burden on KUB from 4/28. Pt is receiving bowel regimen. Per chart, pt is down ~3lbs since admission; pt remains up ~6lbs from her UBW. Pt -2.5L on her I & Os. Pt planned for tracheostomy next week.  Medications reviewed and include: colace, pepcid, heparin, insulin, solu-medrol, nicotine, MVI, miralax, levophed, propofol   Labs reviewed: Na 134(L), K 4.2 wnl, creat 0.40(L), P 3.7 wnl, Mg 2.0 wnl Hgb 10.4(L), Hct 31.3(L) Cbgs- 114, 97, 81, 70 x 24 hrs   Patient is currently intubated on ventilator support MV: 7.7 L/min Temp (24hrs), Avg:99.5 F (37.5 C), Min:98.6 F (37 C), Max:100.8 F (38.2 C)  Propofol: 18.1 ml/hr- provides 478kcal/day   MAP- >53mmHg   UOP- 3465ml/day    Diet Order:   Diet Order             Diet NPO time specified  Diet effective now                  EDUCATION NEEDS:   No education needs have been identified at this time  Skin:  Skin Assessment: Reviewed RN Assessment (ecchymosis)  Last BM:  pta  Height:   Ht Readings from Last 1 Encounters:  03/31/23 5\' 1"  (1.549 m)    Weight:   Wt Readings from Last 1 Encounters:  04/04/23 39.1 kg    Ideal Body Weight:  47.7 kg  BMI:  Body mass index is 16.29 kg/m.  Estimated Nutritional Needs:   Kcal:  1200-1400kcal/day  Protein:  65-75g/day  Fluid:  1.0-1.2L/day  Betsey Holiday MS, RD, LDN Please refer to [facility] for RD and/or RD on-call/weekend/after hours pager

## 2023-04-04 NOTE — Progress Notes (Signed)
NAME:  HENREITTA SPITTLER, MRN:  [id number], DOB:  07-18-1951, LOS: 4 ADMISSION DATE:  03/31/2023, CONSULTATION DATE: 03/31/2023 REFERRING MD: Chiquita Loth CHIEF COMPLAINT: Acute respiratory distress   HPI  72 y.o female w/ significant PMH of COPD, Emphysema, GERD, RA, Fibromyalgia, tobacco abuse, h/o drug abuse (cocaine). EtOH Abuse, Anxiety and Depression and recent diagnosis of chest wall mass concerning for Lymphadenopathy who presented to the ED with chief complaints of acute respiratory distress.  On review of chart, patient recently presented to [facility] emergency room on 03/27/2023 with complaints of chest pain.  CTA chest and CT abdomen pelvis with contrast was obtained which showed extensive mediastinal soft tissue mass and a large retroperitoneal soft tissue mass favored to represent lymphoma. Patient was referred to outpatient oncology for further evaluation.  Patient was evaluated by oncologist Dr. Cathie Hoops on 03/29/2023 who recommended a PET scan and ultrasound-guided biopsy of the right supraclavicular lymphadenopathy for further evaluation.  She also had myeloma panel and peripheral blood flow cytometric and LDH obtained.  Prior to presenting to the ED, patient had been complaining of progressive shortness of breath  all day and was found with oxygen saturation of 66% on room air.  She was placed on a nonrebreather and given Solu-Medrol 125 and 2 albuterol DuoNebs prior to transporting to the ED for further evaluation.   ED Course: Initial vital signs showed HR of 77 beats/minute, BP 90/60 mm Hg, the RR 22 breaths/minute, and the oxygen saturation 78% on NRB and a temperature of 98.32F (36.8C).  He was noted to be in acute respiratory distress with increased work of breathing.  Due to concerns for impending respiratory arrest, patient was intubated for airway protection.  Past Medical History  COPD, Emphysema, GERD, RA, Fibromyalgia, tobacco abuse, h/o drug abuse (cocaine). EtOH Abuse, Anxiety and  Depression and recent diagnosis of chest wall mass concerning for Lymphadenopathy  Significant Hospital Events   4/27: Admitted to ICU with acute on chronic hypoxic hypercapnic respiratory failure  due to multifocal pneumonia requiring mechanical ventilation.  Pt failed extubation requiring reintubation  4/28: Pt remains mechanically intubated vent settings: FiO2 50%/PEEP 5.  Pt with severe anxiety on precedex and fentanyl gtts will start antianxiety medications per tubes  4/29 severe COPD, alert, unable to wean from vent  Consults:  Oncology  Procedures:  4/27: Intubation  Significant Diagnostic Tests:  4/27: Chest Xray>> Endotracheal tube tip about 3.5 cm superior to carina. Esophageal tube tip below the diaphragm but incompletely visualized. Diffuse bilateral interstitial and hazy lung opacity, either representing edema or diffuse lung infection. More focal nodular opacity in the right mid lung may be infectious/ inflammatory or neoplastic in etiology. 4/27: CTA Chest/Abdomen/Pelvis>>Moderate left and small right pleural effusions. Emphysema. Diffuse bilateral septal thickening suggests underlying edema. Heterogeneous bilateral airspace consolidations throughout the lungs suspicious for multifocal pneumonia. Extensive cystic/low-density right supraclavicular, mediastinal, hilar, upper abdominal and retroperitoneal adenopathy. Differential considerations include necrotic metastatic disease, infectious etiology such as tuberculous or nontuberculous mycobacterial infection, and lymphoproliferative disease. Endotracheal tube tip about 1.2 cm superior to carina. Esophageal tube tip just beyond GE junction, suggest further advancement for more optimal positioning, there is moderate fluid distension of the stomach Small volume abdominopelvic ascites. Aortic Atherosclerosis  Interim History / Subjective:  Severe end stage COPD Severe sCHF ef 35% Remains intubated Prognosis is poor +lung mass c/w  lung cancer  PEG TUBE AND LN BIOPSY TODAY  Vent Mode: PRVC FiO2 (%):  [28 %-40 %] 28 % Set Rate:  [  20 bmp] 20 bmp Vt Set:  [380 mL] 380 mL PEEP:  [5 cmH20] 5 cmH20 Plateau Pressure:  [12 cmH20-18 cmH20] 15 cmH20   Micro Data:  4/27: SARS-CoV-2 PCR> negative 4/27: Influenza PCR>>negative 4/27: Respiratory panel>>negative 4/27: Blood culture x2>>negative  4/27: MRSA PCR>>negative  4/27: Strep pneumo urinary antigen>>negative  4/27: Legionella urinary antigen>> 4/27: Respiratory>> 4/27: BAL>>  Anti-infectives (From admission, onward)    Start     Dose/Rate Route Frequency Ordered Stop   04/01/23 0500  vancomycin (VANCOREADY) IVPB 500 mg/100 mL  Status:  Discontinued        500 mg 100 mL/hr over 60 Minutes Intravenous Every 24 hours 03/31/23 0643 04/01/23 1021   03/31/23 1800  ceFEPIme (MAXIPIME) 2 g in sodium chloride 0.9 % 100 mL IVPB        2 g 200 mL/hr over 30 Minutes Intravenous Every 12 hours 03/31/23 0643 04/07/23 1759   03/31/23 0100  vancomycin (VANCOCIN) IVPB 1000 mg/200 mL premix        1,000 mg 200 mL/hr over 60 Minutes Intravenous  Once 03/31/23 0048 03/31/23 0220   03/31/23 0100  ceFEPIme (MAXIPIME) 2 g in sodium chloride 0.9 % 100 mL IVPB        2 g 200 mL/hr over 30 Minutes Intravenous  Once 03/31/23 0048 03/31/23 0201      OBJECTIVE  Blood pressure 101/70, pulse (!) 111, temperature 99.3 F (37.4 C), temperature source Bladder, resp. rate 20, height 5\' 1"  (1.549 m), weight 39.1 kg, SpO2 97 %.    Vent Mode: PRVC FiO2 (%):  [28 %-40 %] 28 % Set Rate:  [20 bmp] 20 bmp Vt Set:  [380 mL] 380 mL PEEP:  [5 cmH20] 5 cmH20 Plateau Pressure:  [12 cmH20-18 cmH20] 15 cmH20   Intake/Output Summary (Last 24 hours) at 04/04/2023 0727 Last data filed at 04/04/2023 0700 Gross per 24 hour  Intake 1909.38 ml  Output 3475 ml  Net -1565.62 ml    Filed Weights   04/02/23 0152 04/03/23 0308 04/04/23 0329  Weight: 37.7 kg 38.8 kg 39.1 kg      REVIEW OF  SYSTEMS  PATIENT IS UNABLE TO PROVIDE COMPLETE REVIEW OF SYSTEMS DUE TO SEVERE CRITICAL ILLNESS   PHYSICAL EXAMINATION:  GENERAL:critically ill appearing, +resp distress thin frail cachectic EYES: Pupils equal, round, reactive to light.  No scleral icterus.  MOUTH: Moist mucosal membrane. INTUBATED  NECK: Supple.  PULMONARY: Lungs clear to auscultation, +rhonchi, +wheezing CARDIOVASCULAR: S1 and S2.  Regular rate and rhythm GASTROINTESTINAL: Soft, nontender, -distended. Positive bowel sounds.  MUSCULOSKELETAL: No swelling, clubbing, or edema.  NEUROLOGIC: obtunded,sedated SKIN:normal, warm to touch, Capillary refill delayed  Pulses present bilaterally     Labs   CBC: Recent Labs  Lab 03/29/23 1553 03/31/23 0046 04/01/23 0402 04/02/23 0200 04/03/23 0302 04/04/23 0408  WBC 7.5 10.1 6.9 7.4 9.0 9.0  NEUTROABS 5.5 7.6  --   --   --   --   HGB 10.3* 10.0* 8.9* 9.3* 10.0* 10.4*  HCT 30.6* 30.5* 26.6* 29.2* 31.1* 31.3*  MCV 92.7 95.3 94.3 95.7 96.3 94.8  PLT 262 276 240 274 334 331     Basic Metabolic Panel: Recent Labs  Lab 04/01/23 0402 04/01/23 1120 04/01/23 1412 04/01/23 1947 04/02/23 0200 04/03/23 0302 04/04/23 0408  NA 134*  --  133*  --  132* 133* 134*  K 5.3*   < > 5.1 5.2* 4.7 4.3 4.2  CL 99  --  99  --  100 98 97*  CO2 29  --  27  --  28 29 31   GLUCOSE 129*  --  131*  --  147* 142* 116*  BUN 20  --  19  --  23 23 17   CREATININE 0.45  --  0.52  --  0.46 0.44 0.40*  CALCIUM 8.4*  --  8.6*  --  8.1* 8.2* 8.6*  MG 1.9  --  1.7  --  1.7 1.7 2.0  PHOS 2.8  --  2.9  --  3.7 3.0 3.7   < > = values in this interval not displayed.    GFR: Estimated Creatinine Clearance: 39.8 mL/min (A) (by C-G formula based on SCr of 0.4 mg/dL (L)). Recent Labs  Lab 03/31/23 0046 03/31/23 0219 04/01/23 0402 04/02/23 0200 04/03/23 0302 04/04/23 0408  PROCALCITON 0.32  --   --   --   --   --   WBC 10.1  --  6.9 7.4 9.0 9.0  LATICACIDVEN 2.3* 1.6  --   --   --   --       Liver Function Tests: Recent Labs  Lab 03/29/23 1553 03/31/23 0046  AST 40 34  ALT 15 12  ALKPHOS 75 66  BILITOT 0.3 0.5  PROT 7.0 6.7  ALBUMIN 3.1* 2.5*    No results for input(s): "LIPASE", "AMYLASE" in the last 168 hours. No results for input(s): "AMMONIA" in the last 168 hours.  ABG    Component Value Date/Time   PHART 7.4 03/31/2023 0028   PCO2ART 56 (H) 03/31/2023 0028   PO2ART 82 (L) 03/31/2023 0028   HCO3 34.7 (H) 03/31/2023 0028   O2SAT 97.9 03/31/2023 0028     Coagulation Profile: Recent Labs  Lab 03/31/23 0602 04/04/23 0408  INR 1.0 1.0     Cardiac Enzymes: No results for input(s): "CKTOTAL", "CKMB", "CKMBINDEX", "TROPONINI" in the last 168 hours.  HbA1C: HB A1C (BAYER DCA - WAIVED)  Date/Time Value Ref Range Status  01/05/2020 03:31 PM 4.8 <7.0 % Final    Comment:                                          Diabetic Adult            <7.0                                       Healthy Adult        4.3 - 5.7                                                           (DCCT/NGSP) American Diabetes Association's Summary of Glycemic Recommendations for Adults with Diabetes: Hemoglobin A1c <7.0%. More stringent glycemic goals (A1c <6.0%) may further reduce complications at the cost of increased risk of hypoglycemia.   11/19/2017 01:41 PM 4.9 <7.0 % Final    Comment:  Diabetic Adult            <7.0                                       Healthy Adult        4.3 - 5.7                                                           (DCCT/NGSP) American Diabetes Association's Summary of Glycemic Recommendations for Adults with Diabetes: Hemoglobin A1c <7.0%. More stringent glycemic goals (A1c <6.0%) may further reduce complications at the cost of increased risk of hypoglycemia.    Hgb A1c MFr Bld  Date/Time Value Ref Range Status  04/01/2023 03:59 AM 6.2 (H) 4.8 - 5.6 % Final    Comment:    (NOTE) Pre diabetes:           5.7%-6.4%  Diabetes:              >6.4%  Glycemic control for   <7.0% adults with diabetes   07/18/2022 01:12 AM 5.1 4.8 - 5.6 % Final    Comment:    (NOTE) Pre diabetes:          5.7%-6.4%  Diabetes:              >6.4%  Glycemic control for   <7.0% adults with diabetes     CBG: Recent Labs  Lab 04/03/23 1143 04/03/23 1637 04/03/23 1924 04/04/23 0015 04/04/23 0328  GLUCAP 163* 133* 73 70 81      Assessment & Plan:  Acute on Chronic Hypoxic and Hypercapnic Respiratory Failure/Bilateral Pleural Effusion/Multifocal Pneumonia Acute Exacerbation of COPD with mediastinal mass likely lung cancer will need [facility] AND PEG for survival   Severe ACUTE Hypoxic and Hypercapnic Respiratory Failure -continue Mechanical Ventilator support -Wean Fio2 and PEEP as tolerated -VAP/VENT bundle implementation - Wean PEEP & FiO2 as tolerated, maintain SpO2 > 88% - Head of bed elevated 30 degrees, VAP protocol in place - Plateau pressures less than 30 cm H20  - Intermittent chest x-ray & ABG PRN - Ensure adequate pulmonary hygiene  PLAN FOR TRACH   SEVERE COPD EXACERBATION -continue IV steroids as prescribed -continue NEB THERAPY as prescribed  Background emphysema and COPD, on triple therapy at home. Now with hypercapnic respiratory failure requiring intubation.  Repeat CT shows bilateral  dense consolidations concerning for multifocal pneumonia and pleural effusion and Extensive cystic/low-density adenopathy Continue IV STEROIDS  SEPTIC shock SOURCE-PNEUMONIA -use vasopressors to keep MAP>65 as needed -follow ABG and LA as needed -follow up cultures -emperic ABX   INFECTIOUS DISEASE -continue antibiotics as prescribed -follow up cultures Sepsis secondary to multifocal pneumonia  Elevated suspect secondary to demand ischemia  No dynamic EKG changes - Continuous telemetry monitoring  - Echo pending  - Troponin's peaked at 871   Lymphadenopathy  Mediastinal  Mass CT chest, Abd/Pelvis shows extensive cystic/low-density right supraclavicular, mediastinal, hilar, upper abdominal and retroperitoneal adenopathy concerning for necrotic metastatic disease, infectious etiology such as tuberculous or nontuberculous mycobacterial infection, and lymphoproliferative disease. -myeloma panel, peripheral blood flow cytometry,  pending from prior work up on 4/25 per oncology  -Ultrasound Guided biopsy of right supraclavicular pending   ENDO -  ICU hypoglycemic\Hyperglycemia protocol -check FSBS per protocol   GI GI PROPHYLAXIS as indicated  NUTRITIONAL STATUS DIET-->TF's as tolerated Constipation protocol as indicated   ELECTROLYTES -follow labs as needed -replace as needed -pharmacy consultation and following    Best practice:  Diet:  NPO; Dietitian consulted to start TF's  Pain/Anxiety/Delirium protocol (if indicated): Yes (RASS goal -1) VAP protocol (if indicated): Yes DVT prophylaxis: Subcutaneous Heparin GI prophylaxis: H2B Glucose control:  SSI Yes Central venous access:  N/A Arterial line:  N/A Foley:  Yes, and it is still needed Mobility:  bed rest  PT consulted: N/A Last date of multidisciplinary goals of care discussion [04/01/2023] Code Status:  full code Disposition: ICU     DVT/GI PRX  assessed I Assessed the need for Labs I Assessed the need for Foley I Assessed the need for Central Venous Line Family Discussion when available I Assessed the need for Mobilization I made an Assessment of medications to be adjusted accordingly Safety Risk assessment completed  CASE DISCUSSED IN MULTIDISCIPLINARY ROUNDS WITH ICU TEAM  PATIENTS LIFE EXPECTANCY WILL BE SHORT LIVED PROGNOSIS IS VERY POOR POOR CHANCE OF MEANINGFUL RECOVERY IN SETTING OF LUNG MALIGNANCY   Critical Care Time devoted to patient care services described in this note is 55 minutes.  Critical care was necessary to treat /prevent imminent and life-threatening  deterioration. Overall, patient is critically ill, prognosis is guarded.  Patient with Multiorgan failure and at high risk for cardiac arrest and death.    Lucie Leather, M.D.  [facility]  Medical Director [facility] Medical Director [facility] Cardio-Pulmonary Department

## 2023-04-04 DEATH — deceased

## 2023-04-05 ENCOUNTER — Telehealth: Payer: Self-pay | Admitting: Family Medicine

## 2023-04-05 ENCOUNTER — Inpatient Hospital Stay: Payer: 59

## 2023-04-05 DIAGNOSIS — J9621 Acute and chronic respiratory failure with hypoxia: Secondary | ICD-10-CM | POA: Diagnosis not present

## 2023-04-05 DIAGNOSIS — J9622 Acute and chronic respiratory failure with hypercapnia: Secondary | ICD-10-CM | POA: Diagnosis not present

## 2023-04-05 LAB — CBC
HCT: 31.8 % — ABNORMAL LOW (ref 36.0–46.0)
Hemoglobin: 10.3 g/dL — ABNORMAL LOW (ref 12.0–15.0)
MCH: 30.6 pg (ref 26.0–34.0)
MCHC: 32.4 g/dL (ref 30.0–36.0)
MCV: 94.4 fL (ref 80.0–100.0)
Platelets: 274 10*3/uL (ref 150–400)
RBC: 3.37 MIL/uL — ABNORMAL LOW (ref 3.87–5.11)
RDW: 14 % (ref 11.5–15.5)
WBC: 8.2 10*3/uL (ref 4.0–10.5)
nRBC: 0 % (ref 0.0–0.2)

## 2023-04-05 LAB — BASIC METABOLIC PANEL
Anion gap: 5 (ref 5–15)
BUN: 15 mg/dL (ref 8–23)
CO2: 34 mmol/L — ABNORMAL HIGH (ref 22–32)
Calcium: 8.6 mg/dL — ABNORMAL LOW (ref 8.9–10.3)
Chloride: 93 mmol/L — ABNORMAL LOW (ref 98–111)
Creatinine, Ser: 0.45 mg/dL (ref 0.44–1.00)
GFR, Estimated: >60 mL/min (ref >60)
Glucose, Bld: 123 mg/dL — ABNORMAL HIGH (ref 70–99)
Potassium: 4.4 mmol/L (ref 3.5–5.1)
Sodium: 132 mmol/L — ABNORMAL LOW (ref 135–145)

## 2023-04-05 LAB — CULTURE, BLOOD (ROUTINE X 2)
Culture: NO GROWTH
Culture: NO GROWTH
Special Requests: ADEQUATE

## 2023-04-05 LAB — GLUCOSE, CAPILLARY
Glucose-Capillary: 128 mg/dL — ABNORMAL HIGH (ref 70–99)
Glucose-Capillary: 112 mg/dL — ABNORMAL HIGH (ref 70–99)
Glucose-Capillary: 159 mg/dL — ABNORMAL HIGH (ref 70–99)
Glucose-Capillary: 98 mg/dL (ref 70–99)
Glucose-Capillary: 113 mg/dL — ABNORMAL HIGH (ref 70–99)
Glucose-Capillary: 108 mg/dL — ABNORMAL HIGH (ref 70–99)

## 2023-04-05 LAB — PHOSPHORUS: Phosphorus: 4.6 mg/dL (ref 2.5–4.6)

## 2023-04-05 LAB — MAGNESIUM: Magnesium: 1.8 mg/dL (ref 1.7–2.4)

## 2023-04-05 MED ORDER — MAGNESIUM SULFATE 2 GM/50ML IV SOLN
2.0000 g | Freq: Once | INTRAVENOUS | Status: AC
  Administered 2023-04-05: 2 g via INTRAVENOUS
  Filled 2023-04-05: qty 50

## 2023-04-05 MED ORDER — PROPOFOL 1000 MG/100ML IV EMUL
5.0000 ug/kg/min | INTRAVENOUS | Status: DC
  Administered 2023-04-06: 5 ug/kg/min via INTRAVENOUS
  Administered 2023-04-06: 50 ug/kg/min via INTRAVENOUS
  Administered 2023-04-07 (×2): 40 ug/kg/min via INTRAVENOUS
  Administered 2023-04-08: 30 ug/kg/min via INTRAVENOUS
  Administered 2023-04-08 – 2023-04-09 (×2): 40 ug/kg/min via INTRAVENOUS
  Administered 2023-04-09: 50 ug/kg/min via INTRAVENOUS
  Administered 2023-04-09: 30 ug/kg/min via INTRAVENOUS
  Administered 2023-04-10: 70 ug/kg/min via INTRAVENOUS
  Administered 2023-04-10 (×2): 50 ug/kg/min via INTRAVENOUS
  Administered 2023-04-11: 60 ug/kg/min via INTRAVENOUS
  Filled 2023-04-05 (×13): qty 100

## 2023-04-05 MED ORDER — MIDAZOLAM-SODIUM CHLORIDE 100-0.9 MG/100ML-% IV SOLN
0.5000 mg/h | INTRAVENOUS | Status: DC
  Administered 2023-04-05: 2 mg/h via INTRAVENOUS
  Administered 2023-04-05: 8 mg/h via INTRAVENOUS
  Administered 2023-04-06: 9.5 mg/h via INTRAVENOUS
  Administered 2023-04-06: 10 mg/h via INTRAVENOUS
  Administered 2023-04-07: 8 mg/h via INTRAVENOUS
  Administered 2023-04-07: 9.5 mg/h via INTRAVENOUS
  Administered 2023-04-08: 9 mg/h via INTRAVENOUS
  Administered 2023-04-08 – 2023-04-09 (×2): 10 mg/h via INTRAVENOUS
  Administered 2023-04-09: 8 mg/h via INTRAVENOUS
  Administered 2023-04-10: 10 mg/h via INTRAVENOUS
  Administered 2023-04-10: 5 mg/h via INTRAVENOUS
  Administered 2023-04-11: 2 mg/h via INTRAVENOUS
  Administered 2023-04-12: 10 mg/h via INTRAVENOUS
  Administered 2023-04-13: 5 mg/h via INTRAVENOUS
  Filled 2023-04-05 (×15): qty 100

## 2023-04-05 NOTE — Progress Notes (Signed)
NAME:  Vanessa Oneal, MRN:  [id number], DOB:  September 29, 1951, LOS: 5 ADMISSION DATE:  03/31/2023, CONSULTATION DATE: 03/31/2023 REFERRING MD: Chiquita Loth CHIEF COMPLAINT: Acute respiratory distress   HPI  72 y.o female w/ significant PMH of COPD, Emphysema, GERD, RA, Fibromyalgia, tobacco abuse, h/o drug abuse (cocaine). EtOH Abuse, Anxiety and Depression and recent diagnosis of chest wall mass concerning for Lymphadenopathy who presented to the ED with chief complaints of acute respiratory distress.  On review of chart, patient recently presented to [facility] emergency room on 03/27/2023 with complaints of chest pain.  CTA chest and CT abdomen pelvis with contrast was obtained which showed extensive mediastinal soft tissue mass and a large retroperitoneal soft tissue mass favored to represent lymphoma. Patient was referred to outpatient oncology for further evaluation.  Patient was evaluated by oncologist Dr. Cathie Hoops on 03/29/2023 who recommended a PET scan and ultrasound-guided biopsy of the right supraclavicular lymphadenopathy for further evaluation.  She also had myeloma panel and peripheral blood flow cytometric and LDH obtained.  Prior to presenting to the ED, patient had been complaining of progressive shortness of breath  all day and was found with oxygen saturation of 66% on room air.  She was placed on a nonrebreather and given Solu-Medrol 125 and 2 albuterol DuoNebs prior to transporting to the ED for further evaluation.   ED Course: Initial vital signs showed HR of 77 beats/minute, BP 90/60 mm Hg, the RR 22 breaths/minute, and the oxygen saturation 78% on NRB and a temperature of 98.55F (36.8C).  He was noted to be in acute respiratory distress with increased work of breathing.  Due to concerns for impending respiratory arrest, patient was intubated for airway protection.  Past Medical History  COPD, Emphysema, GERD, RA, Fibromyalgia, tobacco abuse, h/o drug abuse (cocaine). EtOH Abuse, Anxiety and  Depression and recent diagnosis of chest wall mass concerning for Lymphadenopathy  Significant Hospital Events   4/27: Admitted to ICU with acute on chronic hypoxic hypercapnic respiratory failure  due to multifocal pneumonia requiring mechanical ventilation.  Pt failed extubation requiring reintubation  4/28: Pt remains mechanically intubated vent settings: FiO2 50%/PEEP 5.  Pt with severe anxiety on precedex and fentanyl gtts will start antianxiety medications per tubes  4/29 severe COPD, alert, unable to wean from vent 5/1 s/p PEG and LN biopsy  Consults:  Oncology  Procedures:  4/27: Intubation  Significant Diagnostic Tests:  4/27: Chest Xray>> Endotracheal tube tip about 3.5 cm superior to carina. Esophageal tube tip below the diaphragm but incompletely visualized. Diffuse bilateral interstitial and hazy lung opacity, either representing edema or diffuse lung infection. More focal nodular opacity in the right mid lung may be infectious/ inflammatory or neoplastic in etiology. 4/27: CTA Chest/Abdomen/Pelvis>>Moderate left and small right pleural effusions. Emphysema. Diffuse bilateral septal thickening suggests underlying edema. Heterogeneous bilateral airspace consolidations throughout the lungs suspicious for multifocal pneumonia. Extensive cystic/low-density right supraclavicular, mediastinal, hilar, upper abdominal and retroperitoneal adenopathy. Differential considerations include necrotic metastatic disease, infectious etiology such as tuberculous or nontuberculous mycobacterial infection, and lymphoproliferative disease. Endotracheal tube tip about 1.2 cm superior to carina. Esophageal tube tip just beyond GE junction, suggest further advancement for more optimal positioning, there is moderate fluid distension of the stomach Small volume abdominopelvic ascites. Aortic Atherosclerosis  Interim History / Subjective:  Severe end stage COPD End stage sCHF 35% Remains  intubated Prognosis is poor +lung mass c/w lung cancer Remains on vent   Vent Mode: PRVC FiO2 (%):  [28 %] 28 %  Set Rate:  [20 bmp] 20 bmp Vt Set:  [380 mL] 380 mL PEEP:  [5 cmH20] 5 cmH20   Micro Data:  4/27: SARS-CoV-2 PCR> negative 4/27: Influenza PCR>>negative 4/27: Respiratory panel>>negative 4/27: Blood culture x2>>negative  4/27: MRSA PCR>>negative  4/27: Strep pneumo urinary antigen>>negative  4/27: Legionella urinary antigen>> 4/27: Respiratory>> 4/27: BAL>>  Anti-infectives (From admission, onward)    Start     Dose/Rate Route Frequency Ordered Stop   04/01/23 0500  vancomycin (VANCOREADY) IVPB 500 mg/100 mL  Status:  Discontinued        500 mg 100 mL/hr over 60 Minutes Intravenous Every 24 hours 03/31/23 0643 04/01/23 1021   03/31/23 1800  ceFEPIme (MAXIPIME) 2 g in sodium chloride 0.9 % 100 mL IVPB  Status:  Discontinued        2 g 200 mL/hr over 30 Minutes Intravenous Every 12 hours 03/31/23 0643 04/04/23 1057   03/31/23 0100  vancomycin (VANCOCIN) IVPB 1000 mg/200 mL premix        1,000 mg 200 mL/hr over 60 Minutes Intravenous  Once 03/31/23 0048 03/31/23 0220   03/31/23 0100  ceFEPIme (MAXIPIME) 2 g in sodium chloride 0.9 % 100 mL IVPB        2 g 200 mL/hr over 30 Minutes Intravenous  Once 03/31/23 0048 03/31/23 0201      OBJECTIVE  Blood pressure 102/68, pulse (!) 118, temperature (!) 100.6 F (38.1 C), resp. rate 20, height 5\' 1"  (1.549 m), weight 34.9 kg, SpO2 97 %.    Vent Mode: PRVC FiO2 (%):  [28 %] 28 % Set Rate:  [20 bmp] 20 bmp Vt Set:  [380 mL] 380 mL PEEP:  [5 cmH20] 5 cmH20   Intake/Output Summary (Last 24 hours) at 04/05/2023 0719 Last data filed at 04/05/2023 0600 Gross per 24 hour  Intake 1413.8 ml  Output 3565 ml  Net -2151.2 ml    Filed Weights   04/03/23 0308 04/04/23 0329 04/05/23 0500  Weight: 38.8 kg 39.1 kg 34.9 kg      REVIEW OF SYSTEMS  PATIENT IS UNABLE TO PROVIDE COMPLETE REVIEW OF SYSTEMS DUE TO SEVERE CRITICAL  ILLNESS   PHYSICAL EXAMINATION:  GENERAL:critically ill appearing, +resp distress EYES: Pupils equal, round, reactive to light.  No scleral icterus.  MOUTH: Moist mucosal membrane. INTUBATED NECK: Supple.  PULMONARY: Lungs clear to auscultation, +rhonchi, +wheezing CARDIOVASCULAR: S1 and S2.  Regular rate and rhythm GASTROINTESTINAL: Soft, nontender, -distended. Positive bowel sounds.  MUSCULOSKELETAL: No swelling, clubbing, or edema.  NEUROLOGIC: obtunded,sedated SKIN:normal, warm to touch, Capillary refill delayed  Pulses present bilaterally      Labs   CBC: Recent Labs  Lab 03/29/23 1553 03/31/23 0046 04/01/23 0402 04/02/23 0200 04/03/23 0302 04/04/23 0408 04/05/23 0302  WBC 7.5 10.1 6.9 7.4 9.0 9.0 8.2  NEUTROABS 5.5 7.6  --   --   --   --   --   HGB 10.3* 10.0* 8.9* 9.3* 10.0* 10.4* 10.3*  HCT 30.6* 30.5* 26.6* 29.2* 31.1* 31.3* 31.8*  MCV 92.7 95.3 94.3 95.7 96.3 94.8 94.4  PLT 262 276 240 274 334 331 274     Basic Metabolic Panel: Recent Labs  Lab 04/01/23 1412 04/01/23 1947 04/02/23 0200 04/03/23 0302 04/04/23 0408 04/05/23 0302  NA 133*  --  132* 133* 134* 132*  K 5.1 5.2* 4.7 4.3 4.2 4.4  CL 99  --  100 98 97* 93*  CO2 27  --  28 29 31  34*  GLUCOSE 131*  --  147* 142* 116* 123*  BUN 19  --  23 23 17 15   CREATININE 0.52  --  0.46 0.44 0.40* 0.45  CALCIUM 8.6*  --  8.1* 8.2* 8.6* 8.6*  MG 1.7  --  1.7 1.7 2.0 1.8  PHOS 2.9  --  3.7 3.0 3.7 4.6    GFR: Estimated Creatinine Clearance: 35.5 mL/min (by C-G formula based on SCr of 0.45 mg/dL). Recent Labs  Lab 03/31/23 0046 03/31/23 0219 04/01/23 0402 04/02/23 0200 04/03/23 0302 04/04/23 0408 04/05/23 0302  PROCALCITON 0.32  --   --   --   --   --   --   WBC 10.1  --    < > 7.4 9.0 9.0 8.2  LATICACIDVEN 2.3* 1.6  --   --   --   --   --    < > = values in this interval not displayed.     Liver Function Tests: Recent Labs  Lab 03/29/23 1553 03/31/23 0046  AST 40 34  ALT 15 12   ALKPHOS 75 66  BILITOT 0.3 0.5  PROT 7.0 6.7  ALBUMIN 3.1* 2.5*    No results for input(s): "LIPASE", "AMYLASE" in the last 168 hours. No results for input(s): "AMMONIA" in the last 168 hours.  ABG    Component Value Date/Time   PHART 7.4 03/31/2023 0028   PCO2ART 56 (H) 03/31/2023 0028   PO2ART 82 (L) 03/31/2023 0028   HCO3 34.7 (H) 03/31/2023 0028   O2SAT 97.9 03/31/2023 0028     Coagulation Profile: Recent Labs  Lab 03/31/23 0602 04/04/23 0408  INR 1.0 1.0     Cardiac Enzymes: No results for input(s): "CKTOTAL", "CKMB", "CKMBINDEX", "TROPONINI" in the last 168 hours.  HbA1C: HB A1C (BAYER DCA - WAIVED)  Date/Time Value Ref Range Status  01/05/2020 03:31 PM 4.8 <7.0 % Final    Comment:                                          Diabetic Adult            <7.0                                       Healthy Adult        4.3 - 5.7                                                           (DCCT/NGSP) American Diabetes Association's Summary of Glycemic Recommendations for Adults with Diabetes: Hemoglobin A1c <7.0%. More stringent glycemic goals (A1c <6.0%) may further reduce complications at the cost of increased risk of hypoglycemia.   11/19/2017 01:41 PM 4.9 <7.0 % Final    Comment:                                          Diabetic Adult            <7.0  Healthy Adult        4.3 - 5.7                                                           (DCCT/NGSP) American Diabetes Association's Summary of Glycemic Recommendations for Adults with Diabetes: Hemoglobin A1c <7.0%. More stringent glycemic goals (A1c <6.0%) may further reduce complications at the cost of increased risk of hypoglycemia.    Hgb A1c MFr Bld  Date/Time Value Ref Range Status  04/01/2023 03:59 AM 6.2 (H) 4.8 - 5.6 % Final    Comment:    (NOTE) Pre diabetes:          5.7%-6.4%  Diabetes:              >6.4%  Glycemic control for   <7.0% adults with  diabetes   07/18/2022 01:12 AM 5.1 4.8 - 5.6 % Final    Comment:    (NOTE) Pre diabetes:          5.7%-6.4%  Diabetes:              >6.4%  Glycemic control for   <7.0% adults with diabetes     CBG: Recent Labs  Lab 04/04/23 1253 04/04/23 1546 04/04/23 1930 04/04/23 2310 04/05/23 0308  GLUCAP 114* 103* 92 95 128*      Assessment & Plan:  Acute on Chronic Hypoxic and Hypercapnic Respiratory Failure/Bilateral Pleural Effusion/Multifocal Pneumonia Acute Exacerbation of COPD with mediastinal mass likely lung cancer will need [facility] AND PEG for survival  Severe ACUTE Hypoxic and Hypercapnic Respiratory Failure -continue Mechanical Ventilator support -Wean Fio2 and PEEP as tolerated -VAP/VENT bundle implementation - Wean PEEP & FiO2 as tolerated, maintain SpO2 > 88% - Head of bed elevated 30 degrees, VAP protocol in place - Plateau pressures less than 30 cm H20  - Intermittent chest x-ray & ABG PRN - Ensure adequate pulmonary hygiene  PLAN FOR TRACH  SEVERE COPD EXACERBATION -continue IV steroids as prescribed -continue NEB THERAPY as prescribed -morphine as needed -wean fio2 as needed and tolerated  Background emphysema and COPD, on triple therapy at home. Now with hypercapnic respiratory failure requiring intubation.  Repeat CT shows bilateral  dense consolidations concerning for multifocal pneumonia and pleural effusion and Extensive cystic/low-density adenopathy Continue IV STEROIDS  SEPTIC shock SOURCE--PNEUMONIA -use vasopressors to keep MAP>65 as needed -follow ABG and LA as needed -follow up cultures -emperic ABX   INFECTIOUS DISEASE -continue antibiotics as prescribed -follow up cultures Sepsis secondary to multifocal pneumonia  Elevated suspect secondary to demand ischemia  No dynamic EKG changes - Continuous telemetry monitoring  - Echo pending  - Troponin's peaked at 871   Lymphadenopathy  Mediastinal Mass CT chest, Abd/Pelvis shows  extensive cystic/low-density right supraclavicular, mediastinal, hilar, upper abdominal and retroperitoneal adenopathy concerning for necrotic metastatic disease, infectious etiology such as tuberculous or nontuberculous mycobacterial infection, and lymphoproliferative disease. -myeloma panel, peripheral blood flow cytometry,  pending from prior work up on 4/25 per oncology  -Ultrasound Guided biopsy of right supraclavicular-PATHOLOGY PENDING   ENDO - ICU hypoglycemic\Hyperglycemia protocol -check FSBS per protocol   GI GI PROPHYLAXIS as indicated  NUTRITIONAL STATUS-SEVERE MALNUTRITION DIET-->TF's as tolerated Constipation protocol as indicated   ELECTROLYTES -follow labs as needed -replace as needed -pharmacy consultation and following    Best practice:  Diet:  NPO; Dietitian consulted to start TF's  Pain/Anxiety/Delirium protocol (if indicated): Yes (RASS goal -1) VAP protocol (if indicated): Yes DVT prophylaxis: Subcutaneous Heparin GI prophylaxis: H2B Glucose control:  SSI Yes Central venous access:  N/A Arterial line:  N/A Foley:  Yes, and it is still needed Mobility:  bed rest  PT consulted: N/A Last date of multidisciplinary goals of care discussion [04/01/2023] Code Status:  full code Disposition: ICU      DVT/GI PRX  assessed I Assessed the need for Labs I Assessed the need for Foley I Assessed the need for Central Venous Line Family Discussion when available I Assessed the need for Mobilization I made an Assessment of medications to be adjusted accordingly Safety Risk assessment completed  CASE DISCUSSED IN MULTIDISCIPLINARY ROUNDS WITH ICU TEAM   PATIENTS LIFE EXPECTANCY WILL BE SHORT LIVED PROGNOSIS IS VERY POOR POOR CHANCE OF MEANINGFUL RECOVERY IN SETTING OF LUNG MALIGNANCY   Critical Care Time devoted to patient care services described in this note is 55 minutes.  Critical care was necessary to treat /prevent imminent and life-threatening  deterioration. Overall, patient is critically ill, prognosis is guarded.  Patient with Multiorgan failure and at high risk for cardiac arrest and death.    Lucie Leather, M.D.  [facility]  Medical Director [facility] Medical Director [facility] Cardio-Pulmonary Department

## 2023-04-05 NOTE — Progress Notes (Signed)
RT called to assess ET tube placement due to air being heard around cuff. Tube was still @ 18cm, advanced tube 1cm to 19 at the lip and ET tube trimmed due to the excessive amount of tube protruding from mouth. Annabelle Harman NP Notified of changes.

## 2023-04-05 NOTE — Consult Note (Signed)
PHARMACY CONSULT NOTE  Pharmacy Consult for Electrolyte Monitoring and Replacement   Recent Labs: Potassium (mmol/L)  Date Value  04/05/2023 4.4  01/03/2014 3.6   Magnesium (mg/dL)  Date Value  16/09/9603 1.8  01/03/2014 1.9   Calcium (mg/dL)  Date Value  54/08/8118 8.6 (L)   Calcium, Total (mg/dL)  Date Value  14/78/2956 7.4 (L)   Albumin (g/dL)  Date Value  21/30/8657 2.5 (L)  07/31/2022 2.9 (L)  01/01/2014 3.2 (L)   Phosphorus (mg/dL)  Date Value  84/69/6295 4.6   Sodium (mmol/L)  Date Value  04/05/2023 132 (L)  07/31/2022 137  01/03/2014 139     Assessment: 72 y.o female w/ significant PMH of COPD, Emphysema, GERD, RA, Fibromyalgia, tobacco abuse, h/o drug abuse (cocaine). EtOH Abuse, Anxiety and Depression and recent diagnosis of chest wall mass concerning for Lymphadenopathy who presented to the ED with chief complaints of acute respiratory distress. Patient currently intubated and transferred to ICU. Pharmacy has been consulted to manage and replace electrolytes while under PCCM care.  Diet/Nutrition: Vital 1.5 at 40 mL/hr  Goal of Therapy:  Electrolytes WNL  Plan:  - 2 grams IV magnesium sulfate x 1 - Will recheck electrolytes tomorrow with AM labs  Lowella Bandy ,PharmD Clinical Pharmacist 04/05/2023 7:09 AM

## 2023-04-05 NOTE — Telephone Encounter (Signed)
Contacted Vanessa Oneal to schedule their annual wellness visit. Patient declined to schedule AWV at this time. Patient is in ICU per niece.   Vanessa Oneal; Care Guide Ambulatory Clinical Support [place] [facility] Direct Dial: [phone]

## 2023-04-05 NOTE — Telephone Encounter (Signed)
Lymph node biopsy has been done inpatient
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
## 2023-04-06 DIAGNOSIS — J9621 Acute and chronic respiratory failure with hypoxia: Secondary | ICD-10-CM | POA: Diagnosis not present

## 2023-04-06 DIAGNOSIS — J9622 Acute and chronic respiratory failure with hypercapnia: Secondary | ICD-10-CM | POA: Diagnosis not present

## 2023-04-06 LAB — CBC
HCT: 30.5 % — ABNORMAL LOW (ref 36.0–46.0)
Hemoglobin: 10.1 g/dL — ABNORMAL LOW (ref 12.0–15.0)
MCH: 31.2 pg (ref 26.0–34.0)
MCHC: 33.1 g/dL (ref 30.0–36.0)
MCV: 94.1 fL (ref 80.0–100.0)
Platelets: 233 10*3/uL (ref 150–400)
RBC: 3.24 MIL/uL — ABNORMAL LOW (ref 3.87–5.11)
RDW: 13.8 % (ref 11.5–15.5)
WBC: 5.8 10*3/uL (ref 4.0–10.5)
nRBC: 0 % (ref 0.0–0.2)

## 2023-04-06 LAB — BASIC METABOLIC PANEL
Anion gap: 7 (ref 5–15)
BUN: 20 mg/dL (ref 8–23)
CO2: 31 mmol/L (ref 22–32)
Calcium: 8.6 mg/dL — ABNORMAL LOW (ref 8.9–10.3)
Chloride: 95 mmol/L — ABNORMAL LOW (ref 98–111)
Creatinine, Ser: 0.39 mg/dL — ABNORMAL LOW (ref 0.44–1.00)
GFR, Estimated: >60 mL/min (ref >60)
Glucose, Bld: 148 mg/dL — ABNORMAL HIGH (ref 70–99)
Potassium: 4.3 mmol/L (ref 3.5–5.1)
Sodium: 133 mmol/L — ABNORMAL LOW (ref 135–145)

## 2023-04-06 LAB — GLUCOSE, CAPILLARY
Glucose-Capillary: 124 mg/dL — ABNORMAL HIGH (ref 70–99)
Glucose-Capillary: 122 mg/dL — ABNORMAL HIGH (ref 70–99)
Glucose-Capillary: 152 mg/dL — ABNORMAL HIGH (ref 70–99)
Glucose-Capillary: 186 mg/dL — ABNORMAL HIGH (ref 70–99)
Glucose-Capillary: 117 mg/dL — ABNORMAL HIGH (ref 70–99)

## 2023-04-06 LAB — PHOSPHORUS: Phosphorus: 2.9 mg/dL (ref 2.5–4.6)

## 2023-04-06 LAB — MAGNESIUM: Magnesium: 1.9 mg/dL (ref 1.7–2.4)

## 2023-04-06 LAB — TRIGLYCERIDES: Triglycerides: 53 mg/dL (ref <150)

## 2023-04-06 MED ORDER — OXYCODONE-ACETAMINOPHEN 5-325 MG PO TABS
1.0000 | ORAL_TABLET | ORAL | Status: DC | PRN
  Administered 2023-04-06 (×2): 1
  Filled 2023-04-06 (×3): qty 1

## 2023-04-06 MED ORDER — BISACODYL 10 MG RE SUPP
10.0000 mg | Freq: Every day | RECTAL | Status: DC | PRN
  Administered 2023-04-07: 10 mg via RECTAL
  Filled 2023-04-06: qty 1

## 2023-04-06 NOTE — TOC Progression Note (Signed)
Transition of Care [facility]) - Progression Note    Patient Details  Name: Vanessa Oneal MRN: [id number] Date of Birth: [birth year]  Transition of Care [facility]) CM/SW Contact  Darleene Cleaver, [place] Phone Number: 04/05/2023, 6:21 PM  Clinical Narrative:     TOC continuing to follow patient's progress throughout discharge planning.        Expected Discharge Plan and Services                                               Social Determinants of Health (SDOH) Interventions SDOH Screenings   Food Insecurity: No Food Insecurity (03/31/2023)  Housing: Low Risk  (03/31/2023)  Transportation Needs: No Transportation Needs (03/31/2023)  Utilities: Not At Risk (03/31/2023)  Alcohol Screen: Low Risk  (07/31/2022)  Depression (PHQ2-9): Medium Risk (07/31/2022)  Financial Resource Strain: Low Risk  (07/31/2022)  Physical Activity: Insufficiently Active (07/31/2022)  Social Connections: Socially Isolated (07/31/2022)  Stress: Stress Concern Present (07/31/2022)  Tobacco Use: High Risk (04/05/2023)    Readmission Risk Interventions     No data to display

## 2023-04-06 NOTE — Progress Notes (Signed)
NAME:  Vanessa Oneal, MRN:  [id number], DOB:  07-19-1951, LOS: 6 ADMISSION DATE:  03/31/2023, CONSULTATION DATE: 03/31/2023 REFERRING MD: Chiquita Loth CHIEF COMPLAINT: Acute respiratory distress   HPI  72 y.o female w/ significant PMH of COPD, Emphysema, GERD, RA, Fibromyalgia, tobacco abuse, h/o drug abuse (cocaine). EtOH Abuse, Anxiety and Depression and recent diagnosis of chest wall mass concerning for Lymphadenopathy who presented to the ED with chief complaints of acute respiratory distress.  On review of chart, patient recently presented to [facility] emergency room on 03/27/2023 with complaints of chest pain.  CTA chest and CT abdomen pelvis with contrast was obtained which showed extensive mediastinal soft tissue mass and a large retroperitoneal soft tissue mass favored to represent lymphoma. Patient was referred to outpatient oncology for further evaluation.  Patient was evaluated by oncologist Dr. Cathie Hoops on 03/29/2023 who recommended a PET scan and ultrasound-guided biopsy of the right supraclavicular lymphadenopathy for further evaluation.  She also had myeloma panel and peripheral blood flow cytometric and LDH obtained.  Prior to presenting to the ED, patient had been complaining of progressive shortness of breath  all day and was found with oxygen saturation of 66% on room air.  She was placed on a nonrebreather and given Solu-Medrol 125 and 2 albuterol DuoNebs prior to transporting to the ED for further evaluation.   ED Course: Initial vital signs showed HR of 77 beats/minute, BP 90/60 mm Hg, the RR 22 breaths/minute, and the oxygen saturation 78% on NRB and a temperature of 98.41F (36.8C).  He was noted to be in acute respiratory distress with increased work of breathing.  Due to concerns for impending respiratory arrest, patient was intubated for airway protection.  Past Medical History  COPD, Emphysema, GERD, RA, Fibromyalgia, tobacco abuse, h/o drug abuse (cocaine). EtOH Abuse, Anxiety and  Depression and recent diagnosis of chest wall mass concerning for Lymphadenopathy  Significant Hospital Events   4/27: Admitted to ICU with acute on chronic hypoxic hypercapnic respiratory failure  due to multifocal pneumonia requiring mechanical ventilation.  Pt failed extubation requiring reintubation  4/28: Pt remains mechanically intubated vent settings: FiO2 50%/PEEP 5.  Pt with severe anxiety on precedex and fentanyl gtts will start antianxiety medications per tubes  4/29 severe COPD, alert, unable to wean from vent 5/1 s/p PEG and LN biopsy 5/2 remains on vent, severe end stage COPD, failure to wean from vent  Consults:  Oncology  Procedures:  4/27: Intubation  Significant Diagnostic Tests:  4/27: Chest Xray>> Endotracheal tube tip about 3.5 cm superior to carina. Esophageal tube tip below the diaphragm but incompletely visualized. Diffuse bilateral interstitial and hazy lung opacity, either representing edema or diffuse lung infection. More focal nodular opacity in the right mid lung may be infectious/ inflammatory or neoplastic in etiology. 4/27: CTA Chest/Abdomen/Pelvis>>Moderate left and small right pleural effusions. Emphysema. Diffuse bilateral septal thickening suggests underlying edema. Heterogeneous bilateral airspace consolidations throughout the lungs suspicious for multifocal pneumonia. Extensive cystic/low-density right supraclavicular, mediastinal, hilar, upper abdominal and retroperitoneal adenopathy. Differential considerations include necrotic metastatic disease, infectious etiology such as tuberculous or nontuberculous mycobacterial infection, and lymphoproliferative disease. Endotracheal tube tip about 1.2 cm superior to carina. Esophageal tube tip just beyond GE junction, suggest further advancement for more optimal positioning, there is moderate fluid distension of the stomach Small volume abdominopelvic ascites. Aortic Atherosclerosis  Interim History / Subjective:   Remains intubated Prognosis is very poor S/p PEG tube S/p LN biopsy Remains on vent-failure to wean from vent Severe  end stage COPD End stage sCHF 35% +lung mass c/w lung cancer Vent Mode: PRVC FiO2 (%):  [28 %] 28 % Set Rate:  [20 bmp] 20 bmp Vt Set:  [380 mL] 380 mL PEEP:  [5 cmH20] 5 cmH20 Plateau Pressure:  [10 cmH20-12 cmH20] 12 cmH20   Micro Data:  4/27: SARS-CoV-2 PCR> negative 4/27: Influenza PCR>>negative 4/27: Respiratory panel>>negative 4/27: Blood culture x2>>negative  4/27: MRSA PCR>>negative  4/27: Strep pneumo urinary antigen>>negative  4/27: Legionella urinary antigen>> 4/27: Respiratory>> 4/27: BAL>>  Anti-infectives (From admission, onward)    Start     Dose/Rate Route Frequency Ordered Stop   04/01/23 0500  vancomycin (VANCOREADY) IVPB 500 mg/100 mL  Status:  Discontinued        500 mg 100 mL/hr over 60 Minutes Intravenous Every 24 hours 03/31/23 0643 04/01/23 1021   03/31/23 1800  ceFEPIme (MAXIPIME) 2 g in sodium chloride 0.9 % 100 mL IVPB  Status:  Discontinued        2 g 200 mL/hr over 30 Minutes Intravenous Every 12 hours 03/31/23 0643 04/04/23 1057   03/31/23 0100  vancomycin (VANCOCIN) IVPB 1000 mg/200 mL premix        1,000 mg 200 mL/hr over 60 Minutes Intravenous  Once 03/31/23 0048 03/31/23 0220   03/31/23 0100  ceFEPIme (MAXIPIME) 2 g in sodium chloride 0.9 % 100 mL IVPB        2 g 200 mL/hr over 30 Minutes Intravenous  Once 03/31/23 0048 03/31/23 0201      OBJECTIVE  Blood pressure 99/64, pulse (!) 103, temperature 99.7 F (37.6 C), resp. rate 20, height 5\' 1"  (1.549 m), weight 37.1 kg, SpO2 98 %.    Vent Mode: PRVC FiO2 (%):  [28 %] 28 % Set Rate:  [20 bmp] 20 bmp Vt Set:  [380 mL] 380 mL PEEP:  [5 cmH20] 5 cmH20 Plateau Pressure:  [10 cmH20-12 cmH20] 12 cmH20   Intake/Output Summary (Last 24 hours) at 04/06/2023 0715 Last data filed at 04/06/2023 0600 Gross per 24 hour  Intake 1643.25 ml  Output 1550 ml  Net 93.25 ml     Filed Weights   04/04/23 0329 04/05/23 0500 04/06/23 0500  Weight: 39.1 kg 34.9 kg 37.1 kg      REVIEW OF SYSTEMS  PATIENT IS UNABLE TO PROVIDE COMPLETE REVIEW OF SYSTEMS DUE TO SEVERE CRITICAL ILLNESS   PHYSICAL EXAMINATION:  GENERAL:critically ill appearing, +resp distress thin frail cachectic EYES: Pupils equal, round, reactive to light.  No scleral icterus.  MOUTH: Moist mucosal membrane. INTUBATED NECK: Supple.  PULMONARY: Lungs clear to auscultation, +rhonchi, +wheezing CARDIOVASCULAR: S1 and S2.  Regular rate and rhythm GASTROINTESTINAL: Soft, nontender, -distended. Positive bowel sounds.  MUSCULOSKELETAL: No swelling, clubbing, or edema.  NEUROLOGIC: obtunded,sedated SKIN:normal, warm to touch, Capillary refill delayed  Pulses present bilaterally      Labs   CBC: Recent Labs  Lab 03/31/23 0046 04/01/23 0402 04/02/23 0200 04/03/23 0302 04/04/23 0408 04/05/23 0302 04/06/23 0415  WBC 10.1   < > 7.4 9.0 9.0 8.2 5.8  NEUTROABS 7.6  --   --   --   --   --   --   HGB 10.0*   < > 9.3* 10.0* 10.4* 10.3* 10.1*  HCT 30.5*   < > 29.2* 31.1* 31.3* 31.8* 30.5*  MCV 95.3   < > 95.7 96.3 94.8 94.4 94.1  PLT 276   < > 274 334 331 274 233   < > = values in this  interval not displayed.     Basic Metabolic Panel: Recent Labs  Lab 04/02/23 0200 04/03/23 0302 04/04/23 0408 04/05/23 0302 04/06/23 0415  NA 132* 133* 134* 132* 133*  K 4.7 4.3 4.2 4.4 4.3  CL 100 98 97* 93* 95*  CO2 28 29 31  34* 31  GLUCOSE 147* 142* 116* 123* 148*  BUN 23 23 17 15 20   CREATININE 0.46 0.44 0.40* 0.45 0.39*  CALCIUM 8.1* 8.2* 8.6* 8.6* 8.6*  MG 1.7 1.7 2.0 1.8 1.9  PHOS 3.7 3.0 3.7 4.6 2.9    GFR: Estimated Creatinine Clearance: 37.8 mL/min (A) (by C-G formula based on SCr of 0.39 mg/dL (L)). Recent Labs  Lab 03/31/23 0046 03/31/23 0219 04/01/23 0402 04/03/23 0302 04/04/23 0408 04/05/23 0302 04/06/23 0415  PROCALCITON 0.32  --   --   --   --   --   --   WBC 10.1  --     < > 9.0 9.0 8.2 5.8  LATICACIDVEN 2.3* 1.6  --   --   --   --   --    < > = values in this interval not displayed.     Liver Function Tests: Recent Labs  Lab 03/31/23 0046  AST 34  ALT 12  ALKPHOS 66  BILITOT 0.5  PROT 6.7  ALBUMIN 2.5*    No results for input(s): "LIPASE", "AMYLASE" in the last 168 hours. No results for input(s): "AMMONIA" in the last 168 hours.  ABG    Component Value Date/Time   PHART 7.4 03/31/2023 0028   PCO2ART 56 (H) 03/31/2023 0028   PO2ART 82 (L) 03/31/2023 0028   HCO3 34.7 (H) 03/31/2023 0028   O2SAT 97.9 03/31/2023 0028     Coagulation Profile: Recent Labs  Lab 03/31/23 0602 04/04/23 0408  INR 1.0 1.0     Cardiac Enzymes: No results for input(s): "CKTOTAL", "CKMB", "CKMBINDEX", "TROPONINI" in the last 168 hours.  HbA1C: HB A1C (BAYER DCA - WAIVED)  Date/Time Value Ref Range Status  01/05/2020 03:31 PM 4.8 <7.0 % Final    Comment:                                          Diabetic Adult            <7.0                                       Healthy Adult        4.3 - 5.7                                                           (DCCT/NGSP) American Diabetes Association's Summary of Glycemic Recommendations for Adults with Diabetes: Hemoglobin A1c <7.0%. More stringent glycemic goals (A1c <6.0%) may further reduce complications at the cost of increased risk of hypoglycemia.   11/19/2017 01:41 PM 4.9 <7.0 % Final    Comment:  Diabetic Adult            <7.0                                       Healthy Adult        4.3 - 5.7                                                           (DCCT/NGSP) American Diabetes Association's Summary of Glycemic Recommendations for Adults with Diabetes: Hemoglobin A1c <7.0%. More stringent glycemic goals (A1c <6.0%) may further reduce complications at the cost of increased risk of hypoglycemia.    Hgb A1c MFr Bld  Date/Time Value Ref Range Status   04/01/2023 03:59 AM 6.2 (H) 4.8 - 5.6 % Final    Comment:    (NOTE) Pre diabetes:          5.7%-6.4%  Diabetes:              >6.4%  Glycemic control for   <7.0% adults with diabetes   07/18/2022 01:12 AM 5.1 4.8 - 5.6 % Final    Comment:    (NOTE) Pre diabetes:          5.7%-6.4%  Diabetes:              >6.4%  Glycemic control for   <7.0% adults with diabetes     CBG: Recent Labs  Lab 04/05/23 1158 04/05/23 1625 04/05/23 1948 04/05/23 2315 04/06/23 0346  GLUCAP 159* 98 113* 108* 124*      Assessment & Plan:  Acute on Chronic Hypoxic and Hypercapnic Respiratory Failure/Bilateral Pleural Effusion/Multifocal Pneumonia Acute Exacerbation of COPD with mediastinal mass likely lung cancer will need [facility] AND PEG for survival Failure to wean from vent  Severe ACUTE Hypoxic and Hypercapnic Respiratory Failure -continue Mechanical Ventilator support -Wean Fio2 and PEEP as tolerated -VAP/VENT bundle implementation - Wean PEEP & FiO2 as tolerated, maintain SpO2 > 88% - Head of bed elevated 30 degrees, VAP protocol in place - Plateau pressures less than 30 cm H20  - Intermittent chest x-ray & ABG PRN - Ensure adequate pulmonary hygiene  PLAN FOR TRACH   SEVERE COPD EXACERBATION -continue IV steroids as prescribed -continue NEB THERAPY as prescribed  Background emphysema and COPD, on triple therapy at home. Now with hypercapnic respiratory failure requiring intubation.  Repeat CT shows bilateral  dense consolidations concerning for multifocal pneumonia and pleural effusion and Extensive cystic/low-density adenopathy Continue IV STEROIDS  Lymphadenopathy  Mediastinal Mass CT chest, Abd/Pelvis shows extensive cystic/low-density right supraclavicular, mediastinal, hilar, upper abdominal and retroperitoneal adenopathy concerning for necrotic metastatic disease, infectious etiology such as tuberculous or nontuberculous mycobacterial infection, and lymphoproliferative  disease. -myeloma panel, peripheral blood flow cytometry,  pending from prior work up on 4/25 per oncology  -Ultrasound Guided biopsy of right supraclavicular-PATHOLOGY PENDING  SEPTIC shock SOURCE--PNEUMONIA -use vasopressors to keep MAP>65 as needed  INFECTIOUS DISEASE -continue antibiotics as prescribed -follow up cultures Sepsis secondary to multifocal pneumonia  Elevated suspect secondary to demand ischemia  No dynamic EKG changes - Continuous telemetry monitoring  - Echo pending  - Troponin's peaked at 871   ENDO - ICU hypoglycemic\Hyperglycemia protocol -check FSBS per protocol  GI GI PROPHYLAXIS as indicated  NUTRITIONAL STATUS SEVERE MALNUTRITION DIET-->TF's as tolerated Constipation protocol as indicated   ELECTROLYTES -follow labs as needed -replace as needed -pharmacy consultation and following  PROGNOSIS IS VERY POOR RECOMMEND DNR STATUS CONTINUE AGGRESSIVE MEDICAL CARE   Best practice:  Diet:  NPO; Dietitian consulted to start TF's  Pain/Anxiety/Delirium protocol (if indicated): Yes (RASS goal -1) VAP protocol (if indicated): Yes DVT prophylaxis: Subcutaneous Heparin GI prophylaxis: H2B Glucose control:  SSI Yes Central venous access:  N/A Arterial line:  N/A Foley:  Yes, and it is still needed Mobility:  bed rest  PT consulted: N/A Last date of multidisciplinary goals of care discussion [04/01/2023] Code Status:  full code Disposition: ICU      DVT/GI PRX  assessed I Assessed the need for Labs I Assessed the need for Foley I Assessed the need for Central Venous Line Family Discussion when available I Assessed the need for Mobilization I made an Assessment of medications to be adjusted accordingly Safety Risk assessment completed  CASE DISCUSSED IN MULTIDISCIPLINARY ROUNDS WITH ICU TEAM  PATIENTS LIFE EXPECTANCY WILL BE SHORT LIVED PROGNOSIS IS VERY POOR POOR CHANCE OF MEANINGFUL RECOVERY IN SETTING OF LUNG  MALIGNANCY   Critical Care Time devoted to patient care services described in this note is 55 minutes.  Critical care was necessary to treat /prevent imminent and life-threatening deterioration. Overall, patient is critically ill, prognosis is guarded.  Patient with Multiorgan failure and at high risk for cardiac arrest and death.    Lucie Leather, M.D.  [facility]  Medical Director [facility] Medical Director [facility] Cardio-Pulmonary Department

## 2023-04-06 NOTE — Consult Note (Signed)
PHARMACY CONSULT NOTE  Pharmacy Consult for Electrolyte Monitoring and Replacement   Recent Labs: Potassium (mmol/L)  Date Value  04/06/2023 4.3  01/03/2014 3.6   Magnesium (mg/dL)  Date Value  16/09/9603 1.9  01/03/2014 1.9   Calcium (mg/dL)  Date Value  54/08/8118 8.6 (L)   Calcium, Total (mg/dL)  Date Value  14/78/2956 7.4 (L)   Albumin (g/dL)  Date Value  21/30/8657 2.5 (L)  07/31/2022 2.9 (L)  01/01/2014 3.2 (L)   Phosphorus (mg/dL)  Date Value  84/69/6295 2.9   Sodium (mmol/L)  Date Value  04/06/2023 133 (L)  07/31/2022 137  01/03/2014 139     Assessment: 72 y.o female w/ significant PMH of COPD, Emphysema, GERD, RA, Fibromyalgia, tobacco abuse, h/o drug abuse (cocaine). EtOH Abuse, Anxiety and Depression and recent diagnosis of chest wall mass concerning for Lymphadenopathy who presented to the ED with chief complaints of acute respiratory distress. Patient currently intubated and transferred to ICU. Pharmacy has been consulted to manage and replace electrolytes while under PCCM care.  Diet/Nutrition: Vital 1.5 at 40 mL/hr  Goal of Therapy:  Electrolytes WNL  Plan:  - no electrolyte replacement warranted for today - Will recheck electrolytes tomorrow with AM labs  Lowella Bandy ,PharmD Clinical Pharmacist 04/06/2023 7:18 AM

## 2023-04-07 DIAGNOSIS — J9622 Acute and chronic respiratory failure with hypercapnia: Secondary | ICD-10-CM | POA: Diagnosis not present

## 2023-04-07 DIAGNOSIS — J9621 Acute and chronic respiratory failure with hypoxia: Secondary | ICD-10-CM | POA: Diagnosis not present

## 2023-04-07 LAB — BASIC METABOLIC PANEL
Anion gap: 8 (ref 5–15)
BUN: 22 mg/dL (ref 8–23)
CO2: 30 mmol/L (ref 22–32)
Calcium: 8.6 mg/dL — ABNORMAL LOW (ref 8.9–10.3)
Chloride: 95 mmol/L — ABNORMAL LOW (ref 98–111)
Creatinine, Ser: 0.36 mg/dL — ABNORMAL LOW (ref 0.44–1.00)
GFR, Estimated: >60 mL/min (ref >60)
Glucose, Bld: 154 mg/dL — ABNORMAL HIGH (ref 70–99)
Potassium: 3.8 mmol/L (ref 3.5–5.1)
Sodium: 133 mmol/L — ABNORMAL LOW (ref 135–145)

## 2023-04-07 LAB — GLUCOSE, CAPILLARY
Glucose-Capillary: 104 mg/dL — ABNORMAL HIGH (ref 70–99)
Glucose-Capillary: 123 mg/dL — ABNORMAL HIGH (ref 70–99)
Glucose-Capillary: 133 mg/dL — ABNORMAL HIGH (ref 70–99)
Glucose-Capillary: 143 mg/dL — ABNORMAL HIGH (ref 70–99)
Glucose-Capillary: 195 mg/dL — ABNORMAL HIGH (ref 70–99)
Glucose-Capillary: 86 mg/dL (ref 70–99)
Glucose-Capillary: 93 mg/dL (ref 70–99)

## 2023-04-07 LAB — CBC
HCT: 29.1 % — ABNORMAL LOW (ref 36.0–46.0)
Hemoglobin: 9.5 g/dL — ABNORMAL LOW (ref 12.0–15.0)
MCH: 31.1 pg (ref 26.0–34.0)
MCHC: 32.6 g/dL (ref 30.0–36.0)
MCV: 95.4 fL (ref 80.0–100.0)
Platelets: 233 10*3/uL (ref 150–400)
RBC: 3.05 MIL/uL — ABNORMAL LOW (ref 3.87–5.11)
RDW: 13.8 % (ref 11.5–15.5)
WBC: 5.5 10*3/uL (ref 4.0–10.5)
nRBC: 0 % (ref 0.0–0.2)

## 2023-04-07 MED ORDER — IPRATROPIUM-ALBUTEROL 0.5-2.5 (3) MG/3ML IN SOLN
3.0000 mL | Freq: Three times a day (TID) | RESPIRATORY_TRACT | Status: DC
  Administered 2023-04-08 – 2023-04-14 (×19): 3 mL via RESPIRATORY_TRACT
  Filled 2023-04-07 (×19): qty 3

## 2023-04-07 MED ORDER — LACTULOSE 10 GM/15ML PO SOLN
10.0000 g | Freq: Every day | ORAL | Status: DC | PRN
  Administered 2023-04-07: 10 g
  Filled 2023-04-07: qty 30

## 2023-04-07 NOTE — Plan of Care (Signed)
Patient currently intubated and sedated at this time; plan for tracheostomy on Tuesday. Patient does intermittently become anxious and agitated, with excessive movement in bed, and disconnects vent circuit, and slides down in bed, if not sedated. Versed infusion, propofol infusion, and fentanyl infusion being utilized with bolus of fentanyl x 1 today for pain, and 1 dose IV versed 2 mg for agitation given. Goals discussed with Dr. Belia Heman, and should maintain adequate sedation for safety. O2 sats mid 90's with adequate sedation, but runs lower when patient agitated, low 90's. Tube feeds (Vital 1.5 at 40 cc/hr, with 30 cc q 4hour flushes) via G-tube maintained. No BM documented since admission, on bowel regimen with colace and Miralax. Pt was given Dulcolax suppository today around noon with no result. Discussed with Dr. Belia Heman; pt then given dose of lactulose via G-tube around 5 pm today. Abdomen is round, taut and distended, not significantly different in appearance from yesterday, has active bowel sounds, no vomiting. Tender at G-tube site, opens eyes to pain, but site WNL, no redness or drainage. VSS, low normal BP with MAPS greater than 65. Intermittent low grade fevers. Adequate urine output, with temp foley in place. Sacrum reddened, with protective foam dressing in place; area off-loaded.   Problem: Education: Goal: Knowledge of General Education information will improve Description: Including pain rating scale, medication(s)/side effects and non-pharmacologic comfort measures Outcome: Progressing   Problem: Health Behavior/Discharge Planning: Goal: Ability to manage health-related needs will improve Outcome: Progressing   Problem: Clinical Measurements: Goal: Ability to maintain clinical measurements within normal limits will improve Outcome: Progressing Goal: Will remain free from infection Outcome: Progressing Goal: Diagnostic test results will improve Outcome: Progressing Goal: Respiratory  complications will improve Outcome: Progressing Goal: Cardiovascular complication will be avoided Outcome: Progressing   Problem: Activity: Goal: Risk for activity intolerance will decrease Outcome: Progressing   Problem: Nutrition: Goal: Adequate nutrition will be maintained Outcome: Progressing   Problem: Coping: Goal: Level of anxiety will decrease Outcome: Progressing   Problem: Elimination: Goal: Will not experience complications related to bowel motility Outcome: Progressing Goal: Will not experience complications related to urinary retention Outcome: Progressing   Problem: Pain Managment: Goal: General experience of comfort will improve Outcome: Progressing   Problem: Safety: Goal: Ability to remain free from injury will improve Outcome: Progressing   Problem: Skin Integrity: Goal: Risk for impaired skin integrity will decrease Outcome: Progressing   Problem: Education: Goal: Ability to describe self-care measures that may prevent or decrease complications (Diabetes Survival Skills Education) will improve Outcome: Not Progressing Goal: Individualized Educational Video(s) Outcome: Not Progressing   Problem: Coping: Goal: Ability to adjust to condition or change in health will improve Outcome: Progressing   Problem: Fluid Volume: Goal: Ability to maintain a balanced intake and output will improve Outcome: Progressing   Problem: Health Behavior/Discharge Planning: Goal: Ability to identify and utilize available resources and services will improve Outcome: Progressing Goal: Ability to manage health-related needs will improve Outcome: Progressing   Problem: Metabolic: Goal: Ability to maintain appropriate glucose levels will improve Outcome: Progressing   Problem: Nutritional: Goal: Maintenance of adequate nutrition will improve Outcome: Progressing Goal: Progress toward achieving an optimal weight will improve Outcome: Progressing   Problem: Skin  Integrity: Goal: Risk for impaired skin integrity will decrease Outcome: Progressing   Problem: Tissue Perfusion: Goal: Adequacy of tissue perfusion will improve Outcome: Progressing

## 2023-04-07 NOTE — Progress Notes (Signed)
NAME:  Vanessa Oneal, MRN:  [id number], DOB:  1951-04-28, LOS: 7 ADMISSION DATE:  03/31/2023, CONSULTATION DATE: 03/31/2023 REFERRING MD: Chiquita Loth CHIEF COMPLAINT: Acute respiratory distress   HPI  72 y.o female w/ significant PMH of COPD, Emphysema, GERD, RA, Fibromyalgia, tobacco abuse, h/o drug abuse (cocaine). EtOH Abuse, Anxiety and Depression and recent diagnosis of chest wall mass concerning for Lymphadenopathy who presented to the ED with chief complaints of acute respiratory distress.  On review of chart, patient recently presented to [facility] emergency room on 03/27/2023 with complaints of chest pain.  CTA chest and CT abdomen pelvis with contrast was obtained which showed extensive mediastinal soft tissue mass and a large retroperitoneal soft tissue mass favored to represent lymphoma. Patient was referred to outpatient oncology for further evaluation.  Patient was evaluated by oncologist Dr. Cathie Hoops on 03/29/2023 who recommended a PET scan and ultrasound-guided biopsy of the right supraclavicular lymphadenopathy for further evaluation.  She also had myeloma panel and peripheral blood flow cytometric and LDH obtained.  Prior to presenting to the ED, patient had been complaining of progressive shortness of breath  all day and was found with oxygen saturation of 66% on room air.  She was placed on a nonrebreather and given Solu-Medrol 125 and 2 albuterol DuoNebs prior to transporting to the ED for further evaluation.   ED Course: Initial vital signs showed HR of 77 beats/minute, BP 90/60 mm Hg, the RR 22 breaths/minute, and the oxygen saturation 78% on NRB and a temperature of 98.30F (36.8C).  He was noted to be in acute respiratory distress with increased work of breathing.  Due to concerns for impending respiratory arrest, patient was intubated for airway protection.  Past Medical History  COPD, Emphysema, GERD, RA, Fibromyalgia, tobacco abuse, h/o drug abuse (cocaine). EtOH Abuse, Anxiety and  Depression and recent diagnosis of chest wall mass concerning for Lymphadenopathy  Significant Hospital Events   4/27: Admitted to ICU with acute on chronic hypoxic hypercapnic respiratory failure  due to multifocal pneumonia requiring mechanical ventilation.  Pt failed extubation requiring reintubation  4/28: Pt remains mechanically intubated vent settings: FiO2 50%/PEEP 5.  Pt with severe anxiety on precedex and fentanyl gtts will start antianxiety medications per tubes  4/29 severe COPD, alert, unable to wean from vent 5/1 s/p PEG and LN biopsy 5/2 remains on vent, severe end stage COPD, failure to wean from vent 5/3 remains on vent, severe COPD, failure to wean from vent  Consults:  Oncology  Procedures:  4/27: Intubation  Significant Diagnostic Tests:  4/27: Chest Xray>> Endotracheal tube tip about 3.5 cm superior to carina. Esophageal tube tip below the diaphragm but incompletely visualized. Diffuse bilateral interstitial and hazy lung opacity, either representing edema or diffuse lung infection. More focal nodular opacity in the right mid lung may be infectious/ inflammatory or neoplastic in etiology. 4/27: CTA Chest/Abdomen/Pelvis>>Moderate left and small right pleural effusions. Emphysema. Diffuse bilateral septal thickening suggests underlying edema. Heterogeneous bilateral airspace consolidations throughout the lungs suspicious for multifocal pneumonia. Extensive cystic/low-density right supraclavicular, mediastinal, hilar, upper abdominal and retroperitoneal adenopathy. Differential considerations include necrotic metastatic disease, infectious etiology such as tuberculous or nontuberculous mycobacterial infection, and lymphoproliferative disease. Endotracheal tube tip about 1.2 cm superior to carina. Esophageal tube tip just beyond GE junction, suggest further advancement for more optimal positioning, there is moderate fluid distension of the stomach Small volume abdominopelvic  ascites. Aortic Atherosclerosis  Interim History / Subjective:   Vent Mode: PRVC FiO2 (%):  [28 %]  28 % Set Rate:  [18 bmp-20 bmp] 18 bmp Vt Set:  [380 mL] 380 mL PEEP:  [5 cmH20] 5 cmH20 Plateau Pressure:  [10 cmH20-15 cmH20] 15 cmH20  Remains intubated Prognosis is very poor S/p PEG tube S/p LN biopsy Remains on vent-failure to wean from vent Severe end stage COPD End stage sCHF 35% +lung mass c/w lung cancer      Micro Data:  4/27: SARS-CoV-2 PCR> negative 4/27: Influenza PCR>>negative 4/27: Respiratory panel>>negative 4/27: Blood culture x2>>negative  4/27: MRSA PCR>>negative  4/27: Strep pneumo urinary antigen>>negative  4/27: Legionella urinary antigen>> 4/27: Respiratory>> 4/27: BAL>>  Anti-infectives (From admission, onward)    Start     Dose/Rate Route Frequency Ordered Stop   04/01/23 0500  vancomycin (VANCOREADY) IVPB 500 mg/100 mL  Status:  Discontinued        500 mg 100 mL/hr over 60 Minutes Intravenous Every 24 hours 03/31/23 0643 04/01/23 1021   03/31/23 1800  ceFEPIme (MAXIPIME) 2 g in sodium chloride 0.9 % 100 mL IVPB  Status:  Discontinued        2 g 200 mL/hr over 30 Minutes Intravenous Every 12 hours 03/31/23 0643 04/04/23 1057   03/31/23 0100  vancomycin (VANCOCIN) IVPB 1000 mg/200 mL premix        1,000 mg 200 mL/hr over 60 Minutes Intravenous  Once 03/31/23 0048 03/31/23 0220   03/31/23 0100  ceFEPIme (MAXIPIME) 2 g in sodium chloride 0.9 % 100 mL IVPB        2 g 200 mL/hr over 30 Minutes Intravenous  Once 03/31/23 0048 03/31/23 0201      OBJECTIVE  Blood pressure 96/62, pulse 93, temperature 98.8 F (37.1 C), resp. rate 18, height 5\' 1"  (1.549 m), weight 37 kg, SpO2 95 %.    Vent Mode: PRVC FiO2 (%):  [28 %] 28 % Set Rate:  [18 bmp-20 bmp] 18 bmp Vt Set:  [380 mL] 380 mL PEEP:  [5 cmH20] 5 cmH20 Plateau Pressure:  [10 cmH20-15 cmH20] 15 cmH20   Intake/Output Summary (Last 24 hours) at 04/07/2023 0704 Last data filed at 04/07/2023  0600 Gross per 24 hour  Intake 1702.22 ml  Output 1918 ml  Net -215.78 ml    Filed Weights   04/05/23 0500 04/06/23 0500 04/07/23 0500  Weight: 34.9 kg 37.1 kg 37 kg      REVIEW OF SYSTEMS  PATIENT IS UNABLE TO PROVIDE COMPLETE REVIEW OF SYSTEMS DUE TO SEVERE CRITICAL ILLNESS   PHYSICAL EXAMINATION:  GENERAL:critically ill appearing, +resp distress EYES: Pupils equal, round, reactive to light.  No scleral icterus.  MOUTH: Moist mucosal membrane. INTUBATED NECK: Supple.  PULMONARY: Lungs clear to auscultation, +rhonchi, +wheezing CARDIOVASCULAR: S1 and S2.  Regular rate and rhythm GASTROINTESTINAL: Soft, nontender, -distended. Positive bowel sounds.  MUSCULOSKELETAL: No swelling, clubbing, or edema.  NEUROLOGIC: obtunded,sedated SKIN:normal, warm to touch, Capillary refill delayed  Pulses present bilaterally     Labs   CBC: Recent Labs  Lab 04/03/23 0302 04/04/23 0408 04/05/23 0302 04/06/23 0415 04/07/23 0410  WBC 9.0 9.0 8.2 5.8 5.5  HGB 10.0* 10.4* 10.3* 10.1* 9.5*  HCT 31.1* 31.3* 31.8* 30.5* 29.1*  MCV 96.3 94.8 94.4 94.1 95.4  PLT 334 331 274 233 233     Basic Metabolic Panel: Recent Labs  Lab 04/02/23 0200 04/03/23 0302 04/04/23 0408 04/05/23 0302 04/06/23 0415 04/07/23 0410  NA 132* 133* 134* 132* 133* 133*  K 4.7 4.3 4.2 4.4 4.3 3.8  CL 100 98 97* 93*  95* 95*  CO2 28 29 31  34* 31 30  GLUCOSE 147* 142* 116* 123* 148* 154*  BUN 23 23 17 15 20 22   CREATININE 0.46 0.44 0.40* 0.45 0.39* 0.36*  CALCIUM 8.1* 8.2* 8.6* 8.6* 8.6* 8.6*  MG 1.7 1.7 2.0 1.8 1.9  --   PHOS 3.7 3.0 3.7 4.6 2.9  --     GFR: Estimated Creatinine Clearance: 37.7 mL/min (A) (by C-G formula based on SCr of 0.36 mg/dL (L)). Recent Labs  Lab 04/04/23 0408 04/05/23 0302 04/06/23 0415 04/07/23 0410  WBC 9.0 8.2 5.8 5.5      Assessment & Plan:  Acute on Chronic Hypoxic and Hypercapnic Respiratory Failure/Bilateral Pleural Effusion/Multifocal Pneumonia Acute  Exacerbation of COPD with mediastinal mass likely lung cancer will need [facility] AND PEG for survival Failure to wean from vent  Severe ACUTE Hypoxic and Hypercapnic Respiratory Failure -continue Mechanical Ventilator support -Wean Fio2 and PEEP as tolerated -VAP/VENT bundle implementation - Wean PEEP & FiO2 as tolerated, maintain SpO2 > 88% - Head of bed elevated 30 degrees, VAP protocol in place - Plateau pressures less than 30 cm H20  - Intermittent chest x-ray & ABG PRN - Ensure adequate pulmonary hygiene   PLAN FOR TRACH next week   SEVERE COPD EXACERBATION -continue IV steroids wean as tolerated -continue NEB THERAPY as prescribed +emphysema and COPD, on triple therapy at home. Now with hypercapnic respiratory failure requiring intubation.  Repeat CT shows bilateral  dense consolidations concerning for multifocal pneumonia and pleural effusion and Extensive cystic/low-density adenopathy  Lymphadenopathy  Mediastinal Mass CT chest, Abd/Pelvis shows extensive cystic/low-density right supraclavicular, mediastinal, hilar, upper abdominal and retroperitoneal adenopathy concerning for necrotic metastatic disease, infectious etiology such as tuberculous or nontuberculous mycobacterial infection, and lymphoproliferative disease. -myeloma panel, peripheral blood flow cytometry,  pending from prior work up on 4/25 per oncology  -Ultrasound Guided biopsy of right supraclavicular-PATHOLOGY PENDING  SEPTIC shock SOURCE--PNEUMONIA -use vasopressors to keep MAP>65 as needed  INFECTIOUS DISEASE COMPLETED ABX -follow up cultures Sepsis secondary to multifocal pneumonia   Elevated suspect secondary to demand ischemia  End stage sCHF EF 30% Lasix as tolerated   ENDO - ICU hypoglycemic\Hyperglycemia protocol -check FSBS per protocol   GI GI PROPHYLAXIS as indicated  NUTRITIONAL STATUS DIET-->TF's as tolerated Constipation protocol as indicated   ELECTROLYTES -follow labs as  needed -replace as needed -pharmacy consultation and following  PROGNOSIS IS VERY POOR RECOMMEND DNR STATUS CONTINUE AGGRESSIVE MEDICAL CARE   Best practice:  Diet:  NPO; Dietitian consulted to start TF's  Pain/Anxiety/Delirium protocol (if indicated): Yes (RASS goal -1) VAP protocol (if indicated): Yes DVT prophylaxis: Subcutaneous Heparin GI prophylaxis: H2B Glucose control:  SSI Yes Central venous access:  N/A Arterial line:  N/A Foley:  Yes, and it is still needed Mobility:  bed rest  PT consulted: N/A Last date of multidisciplinary goals of care discussion [04/01/2023] Code Status:  full code Disposition: ICU     DVT/GI PRX  assessed I Assessed the need for Labs I Assessed the need for Foley I Assessed the need for Central Venous Line Family Discussion when available I Assessed the need for Mobilization I made an Assessment of medications to be adjusted accordingly Safety Risk assessment completed  CASE DISCUSSED IN MULTIDISCIPLINARY ROUNDS WITH ICU TEAM  PATIENTS LIFE EXPECTANCY WILL BE SHORT LIVED PROGNOSIS IS VERY POOR POOR CHANCE OF MEANINGFUL RECOVERY IN SETTING OF LUNG MALIGNANCY   Critical Care Time devoted to patient care services described in this note is 63  minutes.  Critical care was necessary to treat /prevent imminent and life-threatening deterioration. Overall, patient is critically ill, prognosis is guarded.    Lucie Leather, M.D.  [facility]  Medical Director [facility] Medical Director [facility] Cardio-Pulmonary Department

## 2023-04-07 NOTE — Consult Note (Signed)
PHARMACY CONSULT NOTE  Pharmacy Consult for Electrolyte Monitoring and Replacement   Recent Labs: Potassium (mmol/L)  Date Value  04/07/2023 3.8  01/03/2014 3.6   Magnesium (mg/dL)  Date Value  16/09/9603 1.9  01/03/2014 1.9   Calcium (mg/dL)  Date Value  54/08/8118 8.6 (L)   Calcium, Total (mg/dL)  Date Value  14/78/2956 7.4 (L)   Albumin (g/dL)  Date Value  21/30/8657 2.5 (L)  07/31/2022 2.9 (L)  01/01/2014 3.2 (L)   Phosphorus (mg/dL)  Date Value  84/69/6295 2.9   Sodium (mmol/L)  Date Value  04/07/2023 133 (L)  07/31/2022 137  01/03/2014 139     Assessment: 72 y.o female w/ significant PMH of COPD, Emphysema, GERD, RA, Fibromyalgia, tobacco abuse, h/o drug abuse (cocaine). EtOH Abuse, Anxiety and Depression and recent diagnosis of chest wall mass concerning for Lymphadenopathy who presented to the ED with chief complaints of acute respiratory distress. Patient currently intubated and transferred to ICU. Pharmacy has been consulted to manage and replace electrolytes while under PCCM care.  Diet/Nutrition: Vital 1.5 at 40 mL/hr. Free water 30 ml q4H.   Goal of Therapy:  Electrolytes WNL  Plan:  - no electrolyte replacement warranted for today - Will recheck electrolytes tomorrow with AM labs  Ronnald Ramp ,PharmD Clinical Pharmacist 04/07/2023 10:29 AM

## 2023-04-08 ENCOUNTER — Inpatient Hospital Stay: Payer: 59

## 2023-04-08 DIAGNOSIS — J9622 Acute and chronic respiratory failure with hypercapnia: Secondary | ICD-10-CM | POA: Diagnosis not present

## 2023-04-08 DIAGNOSIS — J9621 Acute and chronic respiratory failure with hypoxia: Secondary | ICD-10-CM | POA: Diagnosis not present

## 2023-04-08 LAB — GLUCOSE, CAPILLARY
Glucose-Capillary: 113 mg/dL — ABNORMAL HIGH (ref 70–99)
Glucose-Capillary: 122 mg/dL — ABNORMAL HIGH (ref 70–99)
Glucose-Capillary: 155 mg/dL — ABNORMAL HIGH (ref 70–99)
Glucose-Capillary: 129 mg/dL — ABNORMAL HIGH (ref 70–99)
Glucose-Capillary: 121 mg/dL — ABNORMAL HIGH (ref 70–99)

## 2023-04-08 LAB — BASIC METABOLIC PANEL
Anion gap: 4 — ABNORMAL LOW (ref 5–15)
BUN: 19 mg/dL (ref 8–23)
CO2: 30 mmol/L (ref 22–32)
Calcium: 8.2 mg/dL — ABNORMAL LOW (ref 8.9–10.3)
Chloride: 96 mmol/L — ABNORMAL LOW (ref 98–111)
Creatinine, Ser: <0.3 mg/dL — ABNORMAL LOW (ref 0.44–1.00)
Glucose, Bld: 113 mg/dL — ABNORMAL HIGH (ref 70–99)
Potassium: 3.9 mmol/L (ref 3.5–5.1)
Sodium: 130 mmol/L — ABNORMAL LOW (ref 135–145)

## 2023-04-08 LAB — PHOSPHORUS: Phosphorus: 3.3 mg/dL (ref 2.5–4.6)

## 2023-04-08 LAB — MAGNESIUM: Magnesium: 1.7 mg/dL (ref 1.7–2.4)

## 2023-04-08 MED ORDER — MAGNESIUM SULFATE 2 GM/50ML IV SOLN
2.0000 g | Freq: Once | INTRAVENOUS | Status: AC
  Administered 2023-04-08: 2 g via INTRAVENOUS
  Filled 2023-04-08: qty 50

## 2023-04-08 MED ORDER — SIMETHICONE 40 MG/0.6ML PO SUSP: 40.0000 mg | Freq: Four times a day (QID) | ORAL | Status: DC | PRN

## 2023-04-08 NOTE — Progress Notes (Signed)
NAME:  Vanessa Oneal, MRN:  [id number], DOB:  01/20/1951, LOS: 8 ADMISSION DATE:  03/31/2023, CONSULTATION DATE: 03/31/2023 REFERRING MD: Chiquita Loth CHIEF COMPLAINT: Acute respiratory distress   HPI  72 y.o female w/ significant PMH of COPD, Emphysema, GERD, RA, Fibromyalgia, tobacco abuse, h/o drug abuse (cocaine). EtOH Abuse, Anxiety and Depression and recent diagnosis of chest wall mass concerning for Lymphadenopathy who presented to the ED with chief complaints of acute respiratory distress.    ED Course: Initial vital signs showed HR of 77 beats/minute, BP 90/60 mm Hg, the RR 22 breaths/minute, and the oxygen saturation 78% on NRB and a temperature of 98.69F (36.8C).  He was noted to be in acute respiratory distress with increased work of breathing.  Due to concerns for impending respiratory arrest, patient was intubated for airway protection.  Past Medical History  COPD, Emphysema, GERD, RA, Fibromyalgia, tobacco abuse, h/o drug abuse (cocaine). EtOH Abuse, Anxiety and Depression and recent diagnosis of chest wall mass concerning for Lymphadenopathy  Significant Hospital Events   4/27: Admitted to ICU with acute on chronic hypoxic hypercapnic respiratory failure  due to multifocal pneumonia requiring mechanical ventilation.  Pt failed extubation requiring reintubation  4/28: Pt remains mechanically intubated vent settings: FiO2 50%/PEEP 5.  Pt with severe anxiety on precedex and fentanyl gtts will start antianxiety medications per tubes  4/29 severe COPD, alert, unable to wean from vent 5/1 s/p PEG and LN biopsy 5/2 remains on vent, severe end stage COPD, failure to wean from vent 5/3 remains on vent, severe COPD, failure to wean from vent 5/4 failure to wean from vent  Consults:  Oncology  Procedures:  4/27: Intubation  Significant Diagnostic Tests:  4/27: Chest Xray>> Endotracheal tube tip about 3.5 cm superior to carina. Esophageal tube tip below the diaphragm but  incompletely visualized. Diffuse bilateral interstitial and hazy lung opacity, either representing edema or diffuse lung infection. More focal nodular opacity in the right mid lung may be infectious/ inflammatory or neoplastic in etiology. 4/27: CTA Chest/Abdomen/Pelvis>>Moderate left and small right pleural effusions. Emphysema. Diffuse bilateral septal thickening suggests underlying edema. Heterogeneous bilateral airspace consolidations throughout the lungs suspicious for multifocal pneumonia. Extensive cystic/low-density right supraclavicular, mediastinal, hilar, upper abdominal and retroperitoneal adenopathy. Differential considerations include necrotic metastatic disease, infectious etiology such as tuberculous or nontuberculous mycobacterial infection, and lymphoproliferative disease. Endotracheal tube tip about 1.2 cm superior to carina. Esophageal tube tip just beyond GE junction, suggest further advancement for more optimal positioning, there is moderate fluid distension of the stomach Small volume abdominopelvic ascites. Aortic Atherosclerosis  Interim History / Subjective:   Remains intubated Prognosis is very poor S/p PEG tube S/p LN biopsy PATHOLOGY PENDING Remains on vent-failure to wean from vent Severe end stage COPD End stage sCHF 35% +lung mass c/w lung cancer   Vent Mode: PRVC FiO2 (%):  [28 %] 28 % Set Rate:  [18 bmp] 18 bmp Vt Set:  [380 mL] 380 mL PEEP:  [5 cmH20] 5 cmH20 Plateau Pressure:  [10 cmH20-16 cmH20] 16 cmH20      Micro Data:  4/27: SARS-CoV-2 PCR> negative 4/27: Influenza PCR>>negative 4/27: Respiratory panel>>negative 4/27: Blood culture x2>>negative  4/27: MRSA PCR>>negative  4/27: Strep pneumo urinary antigen>>negative  4/27: Legionella urinary antigen>> 4/27: Respiratory>> 4/27: BAL>>  Anti-infectives (From admission, onward)    Start     Dose/Rate Route Frequency Ordered Stop   04/01/23 0500  vancomycin (VANCOREADY) IVPB 500 mg/100 mL   Status:  Discontinued  500 mg 100 mL/hr over 60 Minutes Intravenous Every 24 hours 03/31/23 0643 04/01/23 1021   03/31/23 1800  ceFEPIme (MAXIPIME) 2 g in sodium chloride 0.9 % 100 mL IVPB  Status:  Discontinued        2 g 200 mL/hr over 30 Minutes Intravenous Every 12 hours 03/31/23 0643 04/04/23 1057   03/31/23 0100  vancomycin (VANCOCIN) IVPB 1000 mg/200 mL premix        1,000 mg 200 mL/hr over 60 Minutes Intravenous  Once 03/31/23 0048 03/31/23 0220   03/31/23 0100  ceFEPIme (MAXIPIME) 2 g in sodium chloride 0.9 % 100 mL IVPB        2 g 200 mL/hr over 30 Minutes Intravenous  Once 03/31/23 0048 03/31/23 0201      OBJECTIVE  Blood pressure 110/77, pulse 98, temperature 99.7 F (37.6 C), resp. rate 19, height 5\' 1"  (1.549 m), weight 39.2 kg, SpO2 96 %.    Vent Mode: PRVC FiO2 (%):  [28 %] 28 % Set Rate:  [18 bmp] 18 bmp Vt Set:  [380 mL] 380 mL PEEP:  [5 cmH20] 5 cmH20 Plateau Pressure:  [10 cmH20-16 cmH20] 16 cmH20   Intake/Output Summary (Last 24 hours) at 04/08/2023 0713 Last data filed at 04/08/2023 0600 Gross per 24 hour  Intake 1887.35 ml  Output 1310 ml  Net 577.35 ml    Filed Weights   04/06/23 0500 04/07/23 0500 04/08/23 0500  Weight: 37.1 kg 37 kg 39.2 kg      REVIEW OF SYSTEMS  PATIENT IS UNABLE TO PROVIDE COMPLETE REVIEW OF SYSTEMS DUE TO SEVERE CRITICAL ILLNESS   PHYSICAL EXAMINATION:  GENERAL:critically ill appearing, +resp distress thin, frail, cachectic EYES: Pupils equal, round, reactive to light.  No scleral icterus.  MOUTH: Moist mucosal membrane. INTUBATED NECK: Supple.  PULMONARY: Lungs clear to auscultation, +rhonchi, +wheezing CARDIOVASCULAR: S1 and S2.  Regular rate and rhythm GASTROINTESTINAL: Soft, nontender, -distended. Positive bowel sounds.  MUSCULOSKELETAL: No swelling, clubbing, or edema.  NEUROLOGIC: obtunded,sedated SKIN:normal, warm to touch, Capillary refill delayed  Pulses present bilaterally     Labs   CBC: Recent  Labs  Lab 04/03/23 0302 04/04/23 0408 04/05/23 0302 04/06/23 0415 04/07/23 0410  WBC 9.0 9.0 8.2 5.8 5.5  HGB 10.0* 10.4* 10.3* 10.1* 9.5*  HCT 31.1* 31.3* 31.8* 30.5* 29.1*  MCV 96.3 94.8 94.4 94.1 95.4  PLT 334 331 274 233 233     Basic Metabolic Panel: Recent Labs  Lab 04/03/23 0302 04/04/23 0408 04/05/23 0302 04/06/23 0415 04/07/23 0410 04/08/23 0524  NA 133* 134* 132* 133* 133* 130*  K 4.3 4.2 4.4 4.3 3.8 3.9  CL 98 97* 93* 95* 95* 96*  CO2 29 31 34* 31 30 30   GLUCOSE 142* 116* 123* 148* 154* 113*  BUN 23 17 15 20 22 19   CREATININE 0.44 0.40* 0.45 0.39* 0.36* <0.30*  CALCIUM 8.2* 8.6* 8.6* 8.6* 8.6* 8.2*  MG 1.7 2.0 1.8 1.9  --  1.7  PHOS 3.0 3.7 4.6 2.9  --  3.3    GFR: CrCl cannot be calculated (This lab value cannot be used to calculate CrCl because it is not a number: <0.30). Recent Labs  Lab 04/04/23 0408 04/05/23 0302 04/06/23 0415 04/07/23 0410  WBC 9.0 8.2 5.8 5.5      Assessment & Plan:  Acute on Chronic Hypoxic and Hypercapnic Respiratory Failure/Bilateral Pleural Effusion/Multifocal Pneumonia Acute Exacerbation of COPD with mediastinal mass likely lung cancer will need [facility] AND PEG for survival Failure to  wean from vent  Severe ACUTE Hypoxic and Hypercapnic Respiratory Failure -continue Mechanical Ventilator support -Wean Fio2 and PEEP as tolerated -VAP/VENT bundle implementation - Wean PEEP & FiO2 as tolerated, maintain SpO2 > 88% - Head of bed elevated 30 degrees, VAP protocol in place - Plateau pressures less than 30 cm H20  - Intermittent chest x-ray & ABG PRN - Ensure adequate pulmonary hygiene  PLAN FOR [facility] Tuesday    Lymphadenopathy  Mediastinal Mass CT chest, Abd/Pelvis shows extensive cystic/low-density right supraclavicular, mediastinal, hilar, upper abdominal and retroperitoneal adenopathy concerning for necrotic metastatic disease, infectious etiology such as tuberculous or nontuberculous mycobacterial infection, and  lymphoproliferative disease. -myeloma panel, peripheral blood flow cytometry,  pending from prior work up on 4/25 per oncology  -Ultrasound Guided biopsy of right supraclavicular-PATHOLOGY PENDING   SEVERE COPD EXACERBATION -continue IV steroids wean as tolerated -continue NEB THERAPY as prescribed +emphysema and COPD, on triple therapy at home. Now with hypercapnic respiratory failure requiring intubation.  Repeat CT shows bilateral  dense consolidations concerning for multifocal pneumonia and pleural effusion and Extensive cystic/low-density adenopathy   SEPTIC shock SOURCE--PNEUMONIA -use vasopressors to keep MAP>65 as needed    INFECTIOUS DISEASE COMPLETED ABX -follow up cultures Sepsis secondary to multifocal pneumonia   Elevated suspect secondary to demand ischemia  End stage sCHF EF 30% Lasix as tolerated   ENDO - ICU hypoglycemic\Hyperglycemia protocol -check FSBS per protocol   GI GI PROPHYLAXIS as indicated  NUTRITIONAL STATUS DIET-->TF's as tolerated Constipation protocol as indicated   ELECTROLYTES -follow labs as needed -replace as needed -pharmacy consultation and following  PROGNOSIS IS VERY POOR RECOMMEND DNR STATUS CONTINUE AGGRESSIVE MEDICAL CARE   Best practice:  Diet:  NPO; Dietitian consulted to start TF's  Pain/Anxiety/Delirium protocol (if indicated): Yes (RASS goal -1) VAP protocol (if indicated): Yes DVT prophylaxis: Subcutaneous Heparin GI prophylaxis: H2B Glucose control:  SSI Yes Central venous access:  N/A Arterial line:  N/A Foley:  Yes, and it is still needed Mobility:  bed rest  PT consulted: N/A Last date of multidisciplinary goals of care discussion [04/01/2023] Code Status:  full code Disposition: ICU   CASE DISCUSSED IN MULTIDISCIPLINARY ROUNDS WITH ICU TEAM  PATIENTS LIFE EXPECTANCY WILL BE SHORT LIVED PROGNOSIS IS VERY POOR POOR CHANCE OF MEANINGFUL RECOVERY IN SETTING OF LUNG MALIGNANCY   Critical Care  Time devoted to patient care services described in this note is 55 minutes.  Critical care was necessary to treat /prevent imminent and life-threatening deterioration. Overall, patient is critically ill, prognosis is guarded.    Lucie Leather, M.D.  [facility]  Medical Director [facility] Medical Director [facility] Cardio-Pulmonary Department

## 2023-04-08 NOTE — Plan of Care (Signed)
Problem: Education: Goal: Knowledge of General Education information will improve Description: Including pain rating scale, medication(s)/side effects and non-pharmacologic comfort measures Outcome: Not Progressing   Problem: Health Behavior/Discharge Planning: Goal: Ability to manage health-related needs will improve Outcome: Not Progressing   Problem: Clinical Measurements: Goal: Ability to maintain clinical measurements within normal limits will improve Outcome: Not Progressing Goal: Will remain free from infection Outcome: Not Progressing Goal: Diagnostic test results will improve Outcome: Not Progressing Goal: Respiratory complications will improve Outcome: Not Progressing Goal: Cardiovascular complication will be avoided Outcome: Not Progressing   Problem: Activity: Goal: Risk for activity intolerance will decrease Outcome: Not Progressing   Problem: Nutrition: Goal: Adequate nutrition will be maintained Outcome: Not Progressing   Problem: Coping: Goal: Level of anxiety will decrease Outcome: Not Progressing   Problem: Elimination: Goal: Will not experience complications related to bowel motility Outcome: Not Progressing Goal: Will not experience complications related to urinary retention Outcome: Not Progressing   Problem: Pain Managment: Goal: General experience of comfort will improve Outcome: Not Progressing   Problem: Safety: Goal: Ability to remain free from injury will improve Outcome: Not Progressing   Problem: Skin Integrity: Goal: Risk for impaired skin integrity will decrease Outcome: Not Progressing   Problem: Education: Goal: Ability to describe self-care measures that may prevent or decrease complications (Diabetes Survival Skills Education) will improve Outcome: Not Progressing Goal: Individualized Educational Video(s) Outcome: Not Progressing   Problem: Coping: Goal: Ability to adjust to condition or change in health will  improve Outcome: Not Progressing   Problem: Fluid Volume: Goal: Ability to maintain a balanced intake and output will improve Outcome: Not Progressing   Problem: Health Behavior/Discharge Planning: Goal: Ability to identify and utilize available resources and services will improve Outcome: Not Progressing Goal: Ability to manage health-related needs will improve Outcome: Not Progressing   Problem: Metabolic: Goal: Ability to maintain appropriate glucose levels will improve Outcome: Not Progressing   Problem: Nutritional: Goal: Maintenance of adequate nutrition will improve Outcome: Not Progressing Goal: Progress toward achieving an optimal weight will improve Outcome: Not Progressing   Problem: Skin Integrity: Goal: Risk for impaired skin integrity will decrease Outcome: Not Progressing   Problem: Tissue Perfusion: Goal: Adequacy of tissue perfusion will improve Outcome: Not Progressing   Problem: Education: Goal: Knowledge of disease or condition will improve Outcome: Not Progressing Goal: Knowledge of the prescribed therapeutic regimen will improve Outcome: Not Progressing Goal: Individualized Educational Video(s) Outcome: Not Progressing   Problem: Activity: Goal: Ability to tolerate increased activity will improve Outcome: Not Progressing Goal: Will verbalize the importance of balancing activity with adequate rest periods Outcome: Not Progressing   Problem: Respiratory: Goal: Ability to maintain a clear airway will improve Outcome: Not Progressing Goal: Levels of oxygenation will improve Outcome: Not Progressing Goal: Ability to maintain adequate ventilation will improve Outcome: Not Progressing   Problem: Activity: Goal: Ability to tolerate increased activity will improve Outcome: Not Progressing   Problem: Respiratory: Goal: Ability to maintain a clear airway and adequate ventilation will improve Outcome: Not Progressing   Problem: Role  Relationship: Goal: Method of communication will improve Outcome: Not Progressing   Problem: Education: Goal: Knowledge of disease or condition will improve Outcome: Not Progressing Goal: Understanding of discharge needs will improve Outcome: Not Progressing   Problem: Health Behavior/Discharge Planning: Goal: Ability to identify changes in lifestyle to reduce recurrence of condition will improve Outcome: Not Progressing Goal: Identification of resources available to assist in meeting health care needs will improve Outcome: Not Progressing  Problem: Physical Regulation: Goal: Complications related to the disease process, condition or treatment will be avoided or minimized Outcome: Not Progressing   Problem: Safety: Goal: Ability to remain free from injury will improve Outcome: Not Progressing   Problem: Nutrition Goal: Nutritional status is improving Description: Monitor and assess patient for malnutrition (ex- brittle hair, bruises, dry skin, pale skin and conjunctiva, muscle wasting, smooth red tongue, and disorientation). Collaborate with interdisciplinary team and initiate plan and interventions as ordered.  Monitor patient's weight and dietary intake as ordered or per policy. Utilize nutrition screening tool and intervene per policy. Determine patient's food preferences and provide high-protein, high-caloric foods as appropriate.  Outcome: Not Progressing

## 2023-04-08 NOTE — Consult Note (Signed)
PHARMACY CONSULT NOTE  Pharmacy Consult for Electrolyte Monitoring and Replacement   Recent Labs: Potassium (mmol/L)  Date Value  04/08/2023 3.9  01/03/2014 3.6   Magnesium (mg/dL)  Date Value  81/19/1478 1.7  01/03/2014 1.9   Calcium (mg/dL)  Date Value  29/56/2130 8.2 (L)   Calcium, Total (mg/dL)  Date Value  86/57/8469 7.4 (L)   Albumin (g/dL)  Date Value  62/95/2841 2.5 (L)  07/31/2022 2.9 (L)  01/01/2014 3.2 (L)   Phosphorus (mg/dL)  Date Value  32/44/0102 3.3   Sodium (mmol/L)  Date Value  04/08/2023 130 (L)  07/31/2022 137  01/03/2014 139     Assessment: 72 y.o female w/ significant PMH of COPD, Emphysema, GERD, RA, Fibromyalgia, tobacco abuse, h/o drug abuse (cocaine). EtOH Abuse, Anxiety and Depression and recent diagnosis of chest wall mass concerning for Lymphadenopathy who presented to the ED with chief complaints of acute respiratory distress. Patient currently intubated and transferred to ICU. Pharmacy has been consulted to manage and replace electrolytes while under PCCM care.  Diet/Nutrition: Vital 1.5 at 40 mL/hr. Free water 30 ml q4H.   Goal of Therapy:  Electrolytes WNL  Plan:  Mg 2 g IV x 1  F/u with AM lab.   Ronnald Ramp ,PharmD Clinical Pharmacist 04/08/2023 7:24 AM

## 2023-04-09 ENCOUNTER — Encounter: Payer: Self-pay | Admitting: Oncology

## 2023-04-09 ENCOUNTER — Ambulatory Visit: Payer: 59

## 2023-04-09 DIAGNOSIS — J441 Chronic obstructive pulmonary disease with (acute) exacerbation: Secondary | ICD-10-CM | POA: Diagnosis not present

## 2023-04-09 DIAGNOSIS — J9622 Acute and chronic respiratory failure with hypercapnia: Secondary | ICD-10-CM | POA: Diagnosis not present

## 2023-04-09 DIAGNOSIS — J9621 Acute and chronic respiratory failure with hypoxia: Secondary | ICD-10-CM | POA: Diagnosis not present

## 2023-04-09 LAB — GLUCOSE, CAPILLARY
Glucose-Capillary: 115 mg/dL — ABNORMAL HIGH (ref 70–99)
Glucose-Capillary: 125 mg/dL — ABNORMAL HIGH (ref 70–99)
Glucose-Capillary: 131 mg/dL — ABNORMAL HIGH (ref 70–99)
Glucose-Capillary: 132 mg/dL — ABNORMAL HIGH (ref 70–99)
Glucose-Capillary: 166 mg/dL — ABNORMAL HIGH (ref 70–99)
Glucose-Capillary: 72 mg/dL (ref 70–99)
Glucose-Capillary: 89 mg/dL (ref 70–99)
Glucose-Capillary: 97 mg/dL (ref 70–99)

## 2023-04-09 LAB — CBC WITH DIFFERENTIAL/PLATELET
Abs Immature Granulocytes: 0.05 10*3/uL (ref 0.00–0.07)
Basophils Absolute: 0.1 10*3/uL (ref 0.0–0.1)
Basophils Relative: 1 %
Eosinophils Absolute: 0.5 10*3/uL (ref 0.0–0.5)
Eosinophils Relative: 5 %
HCT: 29.6 % — ABNORMAL LOW (ref 36.0–46.0)
Hemoglobin: 9.7 g/dL — ABNORMAL LOW (ref 12.0–15.0)
Immature Granulocytes: 1 %
Lymphocytes Relative: 8 %
Lymphs Abs: 0.8 10*3/uL (ref 0.7–4.0)
MCH: 31.2 pg (ref 26.0–34.0)
MCHC: 32.8 g/dL (ref 30.0–36.0)
MCV: 95.2 fL (ref 80.0–100.0)
Monocytes Absolute: 1.1 10*3/uL — ABNORMAL HIGH (ref 0.1–1.0)
Monocytes Relative: 11 %
Neutro Abs: 7.1 10*3/uL (ref 1.7–7.7)
Neutrophils Relative %: 74 %
Platelets: 286 10*3/uL (ref 150–400)
RBC: 3.11 MIL/uL — ABNORMAL LOW (ref 3.87–5.11)
RDW: 13.8 % (ref 11.5–15.5)
WBC: 9.5 10*3/uL (ref 4.0–10.5)
nRBC: 0 % (ref 0.0–0.2)

## 2023-04-09 LAB — BASIC METABOLIC PANEL
Anion gap: 9 (ref 5–15)
BUN: 22 mg/dL (ref 8–23)
CO2: 29 mmol/L (ref 22–32)
Calcium: 8.4 mg/dL — ABNORMAL LOW (ref 8.9–10.3)
Chloride: 91 mmol/L — ABNORMAL LOW (ref 98–111)
Creatinine, Ser: 0.31 mg/dL — ABNORMAL LOW (ref 0.44–1.00)
GFR, Estimated: >60 mL/min (ref >60)
Glucose, Bld: 133 mg/dL — ABNORMAL HIGH (ref 70–99)
Potassium: 4 mmol/L (ref 3.5–5.1)
Sodium: 129 mmol/L — ABNORMAL LOW (ref 135–145)

## 2023-04-09 LAB — MAGNESIUM: Magnesium: 1.8 mg/dL (ref 1.7–2.4)

## 2023-04-09 LAB — TRIGLYCERIDES: Triglycerides: 124 mg/dL (ref <150)

## 2023-04-09 MED ORDER — MAGNESIUM SULFATE 2 GM/50ML IV SOLN
2.0000 g | Freq: Once | INTRAVENOUS | Status: AC
  Administered 2023-04-09: 2 g via INTRAVENOUS
  Filled 2023-04-09: qty 50

## 2023-04-09 MED ORDER — ACETAMINOPHEN 325 MG PO TABS
650.0000 mg | ORAL_TABLET | Freq: Four times a day (QID) | ORAL | Status: DC | PRN
  Administered 2023-04-09 – 2023-04-15 (×5): 650 mg
  Filled 2023-04-09 (×5): qty 2

## 2023-04-09 MED ORDER — POLYETHYLENE GLYCOL 3350 17 G PO PACK
17.0000 g | PACK | Freq: Two times a day (BID) | ORAL | Status: DC
  Administered 2023-04-09 – 2023-04-11 (×4): 17 g
  Filled 2023-04-09 (×5): qty 1

## 2023-04-09 MED ORDER — SODIUM CHLORIDE 0.9 % IV SOLN: INTRAVENOUS | Status: DC

## 2023-04-09 MED ORDER — CLONAZEPAM 0.5 MG PO TABS
2.0000 mg | ORAL_TABLET | Freq: Two times a day (BID) | ORAL | Status: DC
  Administered 2023-04-09 – 2023-04-11 (×4): 2 mg
  Filled 2023-04-09 (×4): qty 4

## 2023-04-09 MED ORDER — OXYCODONE HCL 5 MG PO TABS
10.0000 mg | ORAL_TABLET | Freq: Four times a day (QID) | ORAL | Status: DC
  Administered 2023-04-09 – 2023-04-20 (×42): 10 mg
  Filled 2023-04-09 (×43): qty 2

## 2023-04-09 NOTE — Consult Note (Addendum)
PHARMACY CONSULT NOTE  Pharmacy Consult for Electrolyte Monitoring and Replacement   Recent Labs: Potassium (mmol/L)  Date Value  04/09/2023 4.0  01/03/2014 3.6   Magnesium (mg/dL)  Date Value  78/29/5621 1.8  01/03/2014 1.9   Calcium (mg/dL)  Date Value  30/86/5784 8.4 (L)   Calcium, Total (mg/dL)  Date Value  69/62/9528 7.4 (L)   Albumin (g/dL)  Date Value  41/32/4401 2.5 (L)  07/31/2022 2.9 (L)  01/01/2014 3.2 (L)   Phosphorus (mg/dL)  Date Value  02/72/5366 3.3   Sodium (mmol/L)  Date Value  04/09/2023 129 (L)  07/31/2022 137  01/03/2014 139     Assessment: 72 y.o female w/ significant PMH of COPD, Emphysema, GERD, RA, Fibromyalgia, tobacco abuse, h/o drug abuse (cocaine). EtOH Abuse, Anxiety and Depression and recent diagnosis of chest wall mass concerning for Lymphadenopathy who presented to the ED with chief complaints of acute respiratory distress. Patient currently intubated and transferred to ICU. Pharmacy has been consulted to manage and replace electrolytes while under PCCM care.  Diet/Nutrition: Vital 1.5 at 40 mL/hr. Free water 30 ml q4H.   Goal of Therapy:  Electrolytes within normal limits  Plan:  --Na 129 / Cl 91. Continue to monitor --Mg 1.8, magnesium sulfate 2 g IV x 1 --Follow-up electrolytes with AM labs tomorrow  Tressie Ellis 04/09/2023 7:56 AM

## 2023-04-09 NOTE — Progress Notes (Signed)
NAME:  Vanessa Oneal, MRN:  [id number], DOB:  1951-06-27, LOS: 9 ADMISSION DATE:  03/31/2023, CONSULTATION DATE: 03/31/2023 REFERRING MD: Chiquita Loth CHIEF COMPLAINT: Acute respiratory distress   HPI  72 y.o female w/ significant PMH of COPD, Emphysema, GERD, RA, Fibromyalgia, tobacco abuse, h/o drug abuse (cocaine). EtOH Abuse, Anxiety and Depression and recent diagnosis of chest wall mass concerning for Lymphadenopathy who presented to the ED with chief complaints of acute respiratory distress.  On review of chart, patient recently presented to [facility] emergency room on 03/27/2023 with complaints of chest pain.  CTA chest and CT abdomen pelvis with contrast was obtained which showed extensive mediastinal soft tissue mass and a large retroperitoneal soft tissue mass favored to represent lymphoma. Patient was referred to outpatient oncology for further evaluation.  Patient was evaluated by oncologist Dr. Cathie Hoops on 03/29/2023 who recommended a PET scan and ultrasound-guided biopsy of the right supraclavicular lymphadenopathy for further evaluation.  She also had myeloma panel and peripheral blood flow cytometric and LDH obtained.  Prior to presenting to the ED, patient had been complaining of progressive shortness of breath  all day and was found with oxygen saturation of 66% on room air.  She was placed on a nonrebreather and given Solu-Medrol 125 and 2 albuterol DuoNebs prior to transporting to the ED for further evaluation.   ED Course: Initial vital signs showed HR of 77 beats/minute, BP 90/60 mm Hg, the RR 22 breaths/minute, and the oxygen saturation 78% on NRB and a temperature of 98.56F (36.8C).  He was noted to be in acute respiratory distress with increased work of breathing.  Due to concerns for impending respiratory arrest, patient was intubated for airway protection.  Pertinent Labs/Diagnostics Findings: Na+/ K+:130/4.0 Glucose:150 BUN/Cr.:20/0.50   WBC:10 Hgb/Hct: 10.0/30.5  PCT: 0.32 Lactic  acid: 2.3 COVID PCR: Negative, Troponin:  BNP: 418.2  pO2 82; pCO2 56; pH 7.4;  HCO3 34.7, %O2 Sat 97.9.   Patient given 30 cc/kg of fluids and started on broad-spectrum antibiotics Vanco cefepime and Flagyl for sepsis. PCCM consulted for admission.  Past Medical History  COPD, Emphysema, GERD, RA, Fibromyalgia, tobacco abuse, h/o drug abuse (cocaine). EtOH Abuse, Anxiety and Depression and recent diagnosis of chest wall mass concerning for Lymphadenopathy  Significant Hospital Events   4/27: Admitted to ICU with acute on chronic hypoxic hypercapnic respiratory failure  due to multifocal pneumonia requiring mechanical ventilation.  Pt failed extubation requiring reintubation  4/28: Pt remains mechanically intubated vent settings: FiO2 50%/PEEP 5.  Pt with severe anxiety on precedex and fentanyl gtts will start antianxiety medications per tubes   Consults:  Oncology  Procedures:  4/27: Intubation  Significant Diagnostic Tests:  4/27: Chest Xray>> Endotracheal tube tip about 3.5 cm superior to carina. Esophageal tube tip below the diaphragm but incompletely visualized. Diffuse bilateral interstitial and hazy lung opacity, either representing edema or diffuse lung infection. More focal nodular opacity in the right mid lung may be infectious/ inflammatory or neoplastic in etiology. 4/27: CTA Chest/Abdomen/Pelvis>>Moderate left and small right pleural effusions. Emphysema. Diffuse bilateral septal thickening suggests underlying edema. Heterogeneous bilateral airspace consolidations throughout the lungs suspicious for multifocal pneumonia. Extensive cystic/low-density right supraclavicular, mediastinal, hilar, upper abdominal and retroperitoneal adenopathy. Differential considerations include necrotic metastatic disease, infectious etiology such as tuberculous or nontuberculous mycobacterial infection, and lymphoproliferative disease. Endotracheal tube tip about 1.2 cm superior to carina. Esophageal  tube tip just beyond GE junction, suggest further advancement for more optimal positioning, there is moderate fluid distension of  the stomach Small volume abdominopelvic ascites. Aortic Atherosclerosis 4/28: Pt remains mechanically intubated vent settings: FiO2 50%/PEEP 5.  Pt with severe anxiety on precedex and fentanyl gtts will start antianxiety medications per tubes  4/29: severe COPD, alert, unable to wean from vent 5/1: s/p PEG and LN biopsy 5/2: remains on vent, severe end stage COPD, failure to wean from vent 5/3: remains on vent, severe COPD, failure to wean from vent 5/4: failure to wean from vent 5/6: Pt remains mechanically intubated heavily sedated with propofol/versed/fentanyl gtts due to severe anxiety/agitation pending trach placement 05/7  Interim History / Subjective:  As outlined above under significant events   Micro Data:  4/27: SARS-CoV-2 PCR> negative 4/27: Influenza PCR>>negative 4/27: Respiratory panel>>negative 4/27: Blood culture x2>>negative  4/27: MRSA PCR>>negative  4/27: Strep pneumo urinary antigen>>negative  4/27: Legionella urinary antigen>> 4/27: Respiratory>>negative  4/27: Respiratory viral panel>>negative  4/27: Pneumocystis>> 4/27: AFB>>negative 4/27: Fungus>>   Anti-infectives (From admission, onward)    Start     Dose/Rate Route Frequency Ordered Stop   04/01/23 0500  vancomycin (VANCOREADY) IVPB 500 mg/100 mL  Status:  Discontinued        500 mg 100 mL/hr over 60 Minutes Intravenous Every 24 hours 03/31/23 0643 04/01/23 1021   03/31/23 1800  ceFEPIme (MAXIPIME) 2 g in sodium chloride 0.9 % 100 mL IVPB  Status:  Discontinued        2 g 200 mL/hr over 30 Minutes Intravenous Every 12 hours 03/31/23 0643 04/04/23 1057   03/31/23 0100  vancomycin (VANCOCIN) IVPB 1000 mg/200 mL premix        1,000 mg 200 mL/hr over 60 Minutes Intravenous  Once 03/31/23 0048 03/31/23 0220   03/31/23 0100  ceFEPIme (MAXIPIME) 2 g in sodium chloride 0.9 % 100 mL  IVPB        2 g 200 mL/hr over 30 Minutes Intravenous  Once 03/31/23 0048 03/31/23 0201      OBJECTIVE  Blood pressure 93/60, pulse 100, temperature 99.9 F (37.7 C), resp. rate 14, height 5\' 1"  (1.549 m), weight 37.6 kg, SpO2 99 %.    Vent Mode: PRVC FiO2 (%):  [28 %] 28 % Set Rate:  [18 bmp] 18 bmp Vt Set:  [380 mL] 380 mL PEEP:  [5 cmH20] 5 cmH20 Plateau Pressure:  [14 cmH20-16 cmH20] 14 cmH20   Intake/Output Summary (Last 24 hours) at 04/09/2023 0928 Last data filed at 04/09/2023 0759 Gross per 24 hour  Intake 2278.99 ml  Output 1475 ml  Net 803.99 ml   Filed Weights   04/07/23 0500 04/08/23 0500 04/09/23 0500  Weight: 37 kg 39.2 kg 37.6 kg   Physical Examination  GENERAL: Acute on chronically-ill cachetic appearing female, NAD mechanical intubation  HEENT: Head atraumatic, normocephalic. No JVD. Orally intubated  LUNGS: Diminished throughout, even, non labored  CARDIOVASCULAR: Sinus tachycardia, s1s2, rrr, no m/r/g, 2+ radial/1+distal, no edema  ABDOMEN: +BS x4, soft, non distended  EXTREMITIES: Moves all extremities  NEUROLOGIC: Sedated, not following commands, withdraws from painful stimulation, PERRL SKIN: No obvious rash, lesion, or ulcer. Warm to touch  Labs   CBC: Recent Labs  Lab 04/03/23 0302 04/04/23 0408 04/05/23 0302 04/06/23 0415 04/07/23 0410  WBC 9.0 9.0 8.2 5.8 5.5  HGB 10.0* 10.4* 10.3* 10.1* 9.5*  HCT 31.1* 31.3* 31.8* 30.5* 29.1*  MCV 96.3 94.8 94.4 94.1 95.4  PLT 334 331 274 233 233    Basic Metabolic Panel: Recent Labs  Lab 04/03/23 0302 04/04/23 0408 04/05/23 0302 04/06/23  1610 04/07/23 0410 04/08/23 0524 04/09/23 0527  NA 133* 134* 132* 133* 133* 130* 129*  K 4.3 4.2 4.4 4.3 3.8 3.9 4.0  CL 98 97* 93* 95* 95* 96* 91*  CO2 29 31 34* 31 30 30 29   GLUCOSE 142* 116* 123* 148* 154* 113* 133*  BUN 23 17 15 20 22 19 22   CREATININE 0.44 0.40* 0.45 0.39* 0.36* <0.30* 0.31*  CALCIUM 8.2* 8.6* 8.6* 8.6* 8.6* 8.2* 8.4*  MG 1.7 2.0 1.8  1.9  --  1.7 1.8  PHOS 3.0 3.7 4.6 2.9  --  3.3  --    GFR: Estimated Creatinine Clearance: 38.3 mL/min (A) (by C-G formula based on SCr of 0.31 mg/dL (L)). Recent Labs  Lab 04/04/23 0408 04/05/23 0302 04/06/23 0415 04/07/23 0410  WBC 9.0 8.2 5.8 5.5    Liver Function Tests: No results for input(s): "AST", "ALT", "ALKPHOS", "BILITOT", "PROT", "ALBUMIN" in the last 168 hours.  No results for input(s): "LIPASE", "AMYLASE" in the last 168 hours. No results for input(s): "AMMONIA" in the last 168 hours.  ABG    Component Value Date/Time   PHART 7.4 03/31/2023 0028   PCO2ART 56 (H) 03/31/2023 0028   PO2ART 82 (L) 03/31/2023 0028   HCO3 34.7 (H) 03/31/2023 0028   O2SAT 97.9 03/31/2023 0028     Coagulation Profile: Recent Labs  Lab 04/04/23 0408  INR 1.0    Cardiac Enzymes: No results for input(s): "CKTOTAL", "CKMB", "CKMBINDEX", "TROPONINI" in the last 168 hours.  HbA1C: HB A1C (BAYER DCA - WAIVED)  Date/Time Value Ref Range Status  01/05/2020 03:31 PM 4.8 <7.0 % Final    Comment:                                          Diabetic Adult            <7.0                                       Healthy Adult        4.3 - 5.7                                                           (DCCT/NGSP) American Diabetes Association's Summary of Glycemic Recommendations for Adults with Diabetes: Hemoglobin A1c <7.0%. More stringent glycemic goals (A1c <6.0%) may further reduce complications at the cost of increased risk of hypoglycemia.   11/19/2017 01:41 PM 4.9 <7.0 % Final    Comment:                                          Diabetic Adult            <7.0                                       Healthy Adult        4.3 - 5.7                                                           (  DCCT/NGSP) American Diabetes Association's Summary of Glycemic Recommendations for Adults with Diabetes: Hemoglobin A1c <7.0%. More stringent glycemic goals (A1c <6.0%) may further reduce  complications at the cost of increased risk of hypoglycemia.    Hgb A1c MFr Bld  Date/Time Value Ref Range Status  04/01/2023 03:59 AM 6.2 (H) 4.8 - 5.6 % Final    Comment:    (NOTE) Pre diabetes:          5.7%-6.4%  Diabetes:              >6.4%  Glycemic control for   <7.0% adults with diabetes   07/18/2022 01:12 AM 5.1 4.8 - 5.6 % Final    Comment:    (NOTE) Pre diabetes:          5.7%-6.4%  Diabetes:              >6.4%  Glycemic control for   <7.0% adults with diabetes     CBG: Recent Labs  Lab 04/08/23 1940 04/09/23 0015 04/09/23 0045 04/09/23 0312 04/09/23 0731  GLUCAP 121* 72 97 115* 125*    Review of Systems:   Unable to assess pt mechanically intubated   Past Medical History  She,  has a past medical history of Arthritis, Depression, Fibromyalgia, GERD (gastroesophageal reflux disease), H/O drug dependence (HCC), and Wears dentures.   Surgical History    Past Surgical History:  Procedure Laterality Date   BREAST SURGERY     Silicone implants, then removal   CATARACT EXTRACTION W/PHACO Left 05/06/2019   Procedure: CATARACT EXTRACTION PHACO AND INTRAOCULAR LENS PLACEMENT (IOC) LEFT;  Surgeon: Galen Manila, MD;  Location: [facility];  Service: Ophthalmology;  Laterality: Left;   IR GASTROSTOMY TUBE MOD SED  04/04/2023   IR US LIVER BIOPSY  04/04/2023   TUBAL LIGATION       Social History   reports that she has been smoking cigarettes. She has a 14.00 pack-year smoking history. She has never used smokeless tobacco. She reports that she does not currently use alcohol. She reports that she does not use drugs.   Family History   Her family history includes COPD in her sister; Suicidality in her father.   Allergies Allergies  Allergen Reactions   Gabapentin Anaphylaxis     Home Medications  Prior to Admission medications   Medication Sig Start Date End Date Taking? Authorizing Provider  albuterol (VENTOLIN HFA) 108 (90 Base) MCG/ACT  inhaler Inhale 2 puffs into the lungs every 6 (six) hours as needed for wheezing or shortness of breath. Patient not taking: Reported on 03/29/2023 07/23/22   Kathrynn Running, MD  clonazePAM (KLONOPIN) 0.5 MG tablet Take by mouth.    [provider]  Fluticasone-Umeclidin-Vilant (TRELEGY ELLIPTA) 100-62.5-25 MCG/ACT AEPB Inhale 1 Inhalation into the lungs daily in the afternoon. Patient not taking: Reported on 03/29/2023 07/31/22   Olevia Perches P, DO  naloxone [facility]) nasal spray 4 mg/0.1 mL 1 spray into the nostril 1x, may repeat dose in alternate nostrils every 2-3 minutes until EMS arrives 03/29/23   Olevia Perches P, DO  oxyCODONE-acetaminophen (PERCOCET) 10-325 MG tablet Take 1 tablet by mouth every 6 (six) hours as needed for up to 5 days for pain. 03/29/23 04/03/23  Johnson, Megan P, DO  promethazine (PHENERGAN) 25 MG tablet TAKE 1/2 TABLET BY MOUTH EVERY 8 HOURS AS NEEDED FOR NAUSEA AND VOMITING 07/31/22   Olevia Perches P, DO  Scheduled Meds:  budesonide (PULMICORT) nebulizer solution  0.5 mg Nebulization BID  Chlorhexidine Gluconate Cloth  6 each Topical Daily   clonazePAM  0.5 mg Per Tube BID   docusate  100 mg Per Tube BID   famotidine  20 mg Per Tube BID   feeding supplement (VITAL 1.5 CAL)  1,000 mL Per Tube Q24H   free water  30 mL Per Tube Q4H   heparin  5,000 Units Subcutaneous Q8H   insulin aspart  0-9 Units Subcutaneous Q4H   ipratropium-albuterol  3 mL Nebulization TID   methylPREDNISolone (SOLU-MEDROL) injection  20 mg Intravenous Q24H   multivitamin  15 mL Per Tube Daily   nicotine  21 mg Transdermal Daily   mouth rinse  15 mL Mouth Rinse Q2H   polyethylene glycol  17 g Per Tube Daily   QUEtiapine  25 mg Per Tube BID   sodium chloride flush  10-40 mL Intracatheter Q12H   Continuous Infusions:  sodium chloride 10 mL/hr at 04/09/23 0759   fentaNYL infusion INTRAVENOUS 150 mcg/hr (04/09/23 0759)   magnesium sulfate bolus IVPB 2 g (04/09/23 0920)   midazolam 8  mg/hr (04/09/23 0922)   propofol (DIPRIVAN) infusion 40 mcg/kg/min (04/09/23 0759)   PRN Meds:.bisacodyl, docusate sodium, fentaNYL, ipratropium-albuterol, lactulose, midazolam, ondansetron (ZOFRAN) IV, mouth rinse, oxyCODONE-acetaminophen, simethicone, sodium chloride flush   Active Hospital Problem list   Hyperkalemia   Assessment & Plan:  #Acute on Chronic Hypoxic and Hypercapnic Respiratory Failure #Bilateral Pleural Effusion #Multifocal Pneumonia #Acute Exacerbation of COPD  Background emphysema and COPD, on triple therapy at home. Now with hypercapnic respiratory failure requiring intubation.  Repeat CT shows bilateral  dense consolidations concerning for multifocal pneumonia and pleural effusion and Extensive cystic/low-density adenopathy - Full vent support, implement lung protective strategies - Plateau pressures less than 30 cm H20 - Wean FiO2 & PEEP as tolerated to maintain O2 sats >92% - s/p bronchoscopy 03/31/23 - Follow intermittent Chest X-ray & ABG as needed - Spontaneous Breathing Trials when respiratory parameters met and mental status permits - Implement VAP Bundle - Bronchodilators and Pulmicort nebs - Will stop iv steroids  - Pt pending tracheostomy placement 04/10/23  #Sepsis secondary to multifocal pneumonia meets SIRS criteria: Heart Rate 112 beats/minute, Respiratory Rate 24 breaths/minute,Temperature 95.8 - Supplemental oxygen as needed, to maintain SpO2 > 90% - Trend WBC and monitor fever curve  - Completed coarse of abx   #Elevated suspect secondary to demand ischemia  #Grade I diastolic dysfunction  No dynamic EKG changes Echo 04/01/23: EF 35 to 40%; grade I diastolic dysfunction; mild mitral valve regurgitation; mild to moderate aortic valve regurgitation  - Continuous telemetry monitoring  - Troponin's peaked at 871  #Hyponatremia  #Hypomagnesia   - Trend BMP - Replace electrolytes as indicated  - Monitor UOP  - Will start maintenance iv  fluids  #Lymphadenopathy  #Mediastinal Mass CT chest, Abd/Pelvis shows extensive cystic/low-density right supraclavicular, mediastinal, hilar, upper abdominal and retroperitoneal adenopathy concerning for necrotic metastatic disease, infectious etiology such as tuberculous or nontuberculous mycobacterial infection, and lymphoproliferative disease. -myeloma panel, peripheral blood flow cytometry,  pending from prior work up on 4/25 per oncology  - Lymph node biopsy results from 04/04/23 pending  - Will need PET scan in outpatient setting  #Hyperglycemia likely steroid induced  - CBG's q4hrs  - SSI   #Severe anxiety  #Mechanical ventilation pain/discomfort  - Maintain RASS goal 0 to -1 - PAD protocol to maintain RASS goal: Propofol/versed/fentanyl gtts  - Continue outpatient klonopin and will add seroquel  - WUA daily   Best practice:  Diet:  NPO; Dietitian consulted to start TF's  Pain/Anxiety/Delirium protocol (if indicated): Yes (RASS goal -1) VAP protocol (if indicated): Yes DVT prophylaxis: Subcutaneous Heparin GI prophylaxis: H2B Glucose control:  SSI Yes Central venous access:  N/A Arterial line:  N/A Foley:  Yes, and it is still needed Mobility:  bed rest  PT consulted: N/A Last date of multidisciplinary goals of care discussion [04/09/2023] Code Status:  full code Disposition: ICU  05/6: Will update pts son via telephone regarding pts condition and current plan of care   Critical care time: 35 minutes    Zada Girt, AGNP  Pulmonary/Critical Care Pager [phone] (please enter 7 digits) PCCM Consult Pager [phone] (please enter 7 digits)

## 2023-04-10 ENCOUNTER — Inpatient Hospital Stay: Payer: 59 | Admitting: Certified Registered"

## 2023-04-10 ENCOUNTER — Encounter: Admission: EM | Disposition: E | Payer: Self-pay | Source: Home / Self Care | Attending: Internal Medicine

## 2023-04-10 DIAGNOSIS — J9601 Acute respiratory failure with hypoxia: Secondary | ICD-10-CM | POA: Diagnosis not present

## 2023-04-10 DIAGNOSIS — J189 Pneumonia, unspecified organism: Secondary | ICD-10-CM

## 2023-04-10 DIAGNOSIS — J9621 Acute and chronic respiratory failure with hypoxia: Secondary | ICD-10-CM | POA: Diagnosis not present

## 2023-04-10 DIAGNOSIS — A419 Sepsis, unspecified organism: Principal | ICD-10-CM

## 2023-04-10 DIAGNOSIS — J441 Chronic obstructive pulmonary disease with (acute) exacerbation: Secondary | ICD-10-CM | POA: Diagnosis not present

## 2023-04-10 HISTORY — PX: TRACHEOSTOMY TUBE PLACEMENT: SHX814

## 2023-04-10 LAB — GLUCOSE, CAPILLARY
Glucose-Capillary: 102 mg/dL — ABNORMAL HIGH (ref 70–99)
Glucose-Capillary: 77 mg/dL (ref 70–99)
Glucose-Capillary: 119 mg/dL — ABNORMAL HIGH (ref 70–99)
Glucose-Capillary: 98 mg/dL (ref 70–99)
Glucose-Capillary: 99 mg/dL (ref 70–99)
Glucose-Capillary: 118 mg/dL — ABNORMAL HIGH (ref 70–99)

## 2023-04-10 LAB — CBC WITH DIFFERENTIAL/PLATELET
Abs Immature Granulocytes: 0.05 10*3/uL (ref 0.00–0.07)
Basophils Absolute: 0 10*3/uL (ref 0.0–0.1)
Basophils Relative: 0 %
Eosinophils Absolute: 0.3 10*3/uL (ref 0.0–0.5)
Eosinophils Relative: 3 %
HCT: 27.1 % — ABNORMAL LOW (ref 36.0–46.0)
Hemoglobin: 9 g/dL — ABNORMAL LOW (ref 12.0–15.0)
Immature Granulocytes: 1 %
Lymphocytes Relative: 6 %
Lymphs Abs: 0.6 10*3/uL — ABNORMAL LOW (ref 0.7–4.0)
MCH: 31.5 pg (ref 26.0–34.0)
MCHC: 33.2 g/dL (ref 30.0–36.0)
MCV: 94.8 fL (ref 80.0–100.0)
Monocytes Absolute: 1.1 10*3/uL — ABNORMAL HIGH (ref 0.1–1.0)
Monocytes Relative: 11 %
Neutro Abs: 7.9 10*3/uL — ABNORMAL HIGH (ref 1.7–7.7)
Neutrophils Relative %: 79 %
Platelets: 263 10*3/uL (ref 150–400)
RBC: 2.86 MIL/uL — ABNORMAL LOW (ref 3.87–5.11)
RDW: 13.6 % (ref 11.5–15.5)
WBC: 10 10*3/uL (ref 4.0–10.5)
nRBC: 0 % (ref 0.0–0.2)

## 2023-04-10 LAB — MAGNESIUM: Magnesium: 1.7 mg/dL (ref 1.7–2.4)

## 2023-04-10 LAB — BASIC METABOLIC PANEL
Anion gap: 6 (ref 5–15)
BUN: 20 mg/dL (ref 8–23)
CO2: 28 mmol/L (ref 22–32)
Calcium: 7.8 mg/dL — ABNORMAL LOW (ref 8.9–10.3)
Chloride: 93 mmol/L — ABNORMAL LOW (ref 98–111)
Creatinine, Ser: <0.3 mg/dL — ABNORMAL LOW (ref 0.44–1.00)
Glucose, Bld: 86 mg/dL (ref 70–99)
Potassium: 4.4 mmol/L (ref 3.5–5.1)
Sodium: 127 mmol/L — ABNORMAL LOW (ref 135–145)

## 2023-04-10 LAB — CREATININE, URINE, RANDOM: Creatinine, Urine: 36 mg/dL

## 2023-04-10 LAB — PROTIME-INR
INR: 1 (ref 0.8–1.2)
Prothrombin Time: 13.4 seconds (ref 11.4–15.2)

## 2023-04-10 LAB — PHOSPHORUS: Phosphorus: 3.9 mg/dL (ref 2.5–4.6)

## 2023-04-10 LAB — SODIUM, URINE, RANDOM: Sodium, Ur: 38 mmol/L

## 2023-04-10 LAB — OSMOLALITY, URINE: Osmolality, Ur: 553 mOsm/kg (ref 300–900)

## 2023-04-10 LAB — OSMOLALITY: Osmolality: 273 mOsm/kg — ABNORMAL LOW (ref 275–295)

## 2023-04-10 LAB — SURGICAL PATHOLOGY

## 2023-04-10 LAB — APTT: aPTT: 36 seconds (ref 24–36)

## 2023-04-10 SURGERY — CREATION, TRACHEOSTOMY
Anesthesia: General

## 2023-04-10 MED ORDER — PROPOFOL 10 MG/ML IV BOLUS
INTRAVENOUS | Status: AC
  Filled 2023-04-10: qty 20

## 2023-04-10 MED ORDER — MAGNESIUM SULFATE 2 GM/50ML IV SOLN
2.0000 g | Freq: Once | INTRAVENOUS | Status: AC
  Administered 2023-04-10: 2 g via INTRAVENOUS
  Filled 2023-04-10: qty 50

## 2023-04-10 MED ORDER — KETAMINE HCL 50 MG/5ML IJ SOSY
PREFILLED_SYRINGE | INTRAMUSCULAR | Status: AC
  Filled 2023-04-10: qty 5

## 2023-04-10 MED ORDER — ROCURONIUM BROMIDE 100 MG/10ML IV SOLN
INTRAVENOUS | Status: DC | PRN
  Administered 2023-04-10: 20 mg via INTRAVENOUS

## 2023-04-10 MED ORDER — LIDOCAINE-EPINEPHRINE 1 %-1:100000 IJ SOLN
INTRAMUSCULAR | Status: DC | PRN
  Administered 2023-04-10: 3 mL

## 2023-04-10 MED ORDER — KETAMINE HCL 10 MG/ML IJ SOLN
INTRAMUSCULAR | Status: DC | PRN
  Administered 2023-04-10: 30 mg via INTRAVENOUS
  Administered 2023-04-10: 20 mg via INTRAVENOUS

## 2023-04-10 MED ORDER — 0.9 % SODIUM CHLORIDE (POUR BTL) OPTIME
TOPICAL | Status: DC | PRN
  Administered 2023-04-10: 500 mL

## 2023-04-10 MED ORDER — LIDOCAINE-EPINEPHRINE 1 %-1:100000 IJ SOLN
INTRAMUSCULAR | Status: AC
  Filled 2023-04-10: qty 1

## 2023-04-10 SURGICAL SUPPLY — 36 items
BLADE SURG 15 STRL LF DISP TIS (BLADE) ×1 IMPLANT
BLADE SURG 15 STRL SS (BLADE) ×1
BLADE SURG SZ11 CARB STEEL (BLADE) ×1 IMPLANT
CORD BIP STRL DISP 12FT (MISCELLANEOUS) IMPLANT
ELECT CAUTERY BLADE TIP 2.5 (TIP) ×1
ELECT REM PT RETURN 9FT ADLT (ELECTROSURGICAL) ×1
ELECTRODE CAUTERY BLDE TIP 2.5 (TIP) ×1 IMPLANT
ELECTRODE REM PT RTRN 9FT ADLT (ELECTROSURGICAL) ×1 IMPLANT
FORCEPS JEWEL BIP 4-3/4 STR (INSTRUMENTS) IMPLANT
GAUZE 4X4 16PLY ~~LOC~~+RFID DBL (SPONGE) ×1 IMPLANT
GLOVE BIO SURGEON STRL SZ7.5 (GLOVE) ×1 IMPLANT
GOWN STRL REUS W/ TWL LRG LVL3 (GOWN DISPOSABLE) ×3 IMPLANT
GOWN STRL REUS W/TWL LRG LVL3 (GOWN DISPOSABLE) ×4
HLDR TRACH TUBE NECKBAND 18 (MISCELLANEOUS) ×1 IMPLANT
LABEL OR SOLS (LABEL) ×1 IMPLANT
MANIFOLD NEPTUNE II (INSTRUMENTS) ×1 IMPLANT
NS IRRIG 500ML POUR BTL (IV SOLUTION) ×1 IMPLANT
PACK HEAD/NECK (MISCELLANEOUS) ×1 IMPLANT
SHEARS HARMONIC 9CM CVD (BLADE) ×1 IMPLANT
SOL PREP POV-IOD 4OZ 10% (MISCELLANEOUS) ×1 IMPLANT
SPONGE DRAIN TRACH 4X4 STRL 2S (GAUZE/BANDAGES/DRESSINGS) ×1 IMPLANT
SPONGE KITTNER 5P (MISCELLANEOUS) ×1 IMPLANT
SPONGE VERSALON 4X4 4PLY (MISCELLANEOUS) ×1 IMPLANT
SUCTION FRAZIER HANDLE 10FR (MISCELLANEOUS) ×1
SUCTION TUBE FRAZIER 10FR DISP (MISCELLANEOUS) ×1 IMPLANT
SUT ETHILON 2 0 FS 18 (SUTURE) ×1 IMPLANT
SUT SILK 2 0 (SUTURE)
SUT SILK 2 0 SH (SUTURE) ×1 IMPLANT
SUT SILK 2-0 18XBRD TIE 12 (SUTURE) IMPLANT
SUT VIC AB 3-0 PS2 18 (SUTURE) ×1 IMPLANT
TRAP FLUID SMOKE EVACUATOR (MISCELLANEOUS) ×1 IMPLANT
TUBE TRACH  6.0 CUFF FLEX (MISCELLANEOUS) ×1
TUBE TRACH 6.0 CUFF FLEX (MISCELLANEOUS) IMPLANT
TUBE TRACH FLEX 8.0 CUFF (MISCELLANEOUS)
TUBE TRACH FLEX 8.5 CUFF (MISCELLANEOUS) IMPLANT
WATER STERILE IRR 500ML POUR (IV SOLUTION) ×1 IMPLANT

## 2023-04-10 NOTE — Transfer of Care (Signed)
Immediate Anesthesia Transfer of Care Note  Patient: Vanessa Oneal  Procedure(s) Performed: TRACHEOSTOMY  Patient Location: ICU  Anesthesia Type:General  Level of Consciousness: sedated and Patient remains intubated per anesthesia plan  Airway & Oxygen Therapy: Patient remains intubated per anesthesia plan and Patient placed on Ventilator (see vital sign flow sheet for setting)  Post-op Assessment: Report given to RN and Post -op Vital signs reviewed and stable  Post vital signs: Reviewed and stable  Last Vitals: see Flowsheets data Vitals Value Taken Time  BP    Temp    Pulse    Resp    SpO2      Last Pain:  Vitals:   04/10/23 0700  TempSrc: Bladder  PainSc:          Complications: No notable events documented.

## 2023-04-10 NOTE — Consult Note (Signed)
PHARMACY CONSULT NOTE  Pharmacy Consult for Electrolyte Monitoring and Replacement   Recent Labs: Potassium (mmol/L)  Date Value  04/10/2023 4.4  01/03/2014 3.6   Magnesium (mg/dL)  Date Value  16/09/9603 1.7  01/03/2014 1.9   Calcium (mg/dL)  Date Value  54/08/8118 7.8 (L)   Calcium, Total (mg/dL)  Date Value  14/78/2956 7.4 (L)   Albumin (g/dL)  Date Value  21/30/8657 2.5 (L)  07/31/2022 2.9 (L)  01/01/2014 3.2 (L)   Phosphorus (mg/dL)  Date Value  84/69/6295 3.9   Sodium (mmol/L)  Date Value  04/10/2023 127 (L)  07/31/2022 137  01/03/2014 139     Assessment: 72 y.o female w/ significant PMH of COPD, Emphysema, GERD, RA, Fibromyalgia, tobacco abuse, h/o drug abuse (cocaine). EtOH Abuse, Anxiety and Depression and recent diagnosis of chest wall mass concerning for Lymphadenopathy who presented to the ED with chief complaints of acute respiratory distress. Patient currently intubated and transferred to ICU. Pharmacy has been consulted to manage and replace electrolytes while under PCCM care.  Diet/Nutrition: Vital 1.5 at 40 mL/hr. Free water 30 ml q4H.   Goal of Therapy:  Electrolytes within normal limits  Plan:  --Na 127 / Cl 93. Continue to monitor --Mg 1.7, magnesium sulfate 2 g IV x 1 --Follow-up electrolytes with AM labs tomorrow  Bettey Costa 04/10/2023 7:38 AM

## 2023-04-10 NOTE — Anesthesia Preprocedure Evaluation (Addendum)
Anesthesia Evaluation  Patient identified by MRN, date of birth, ID band Patient awake    Reviewed: Allergy & Precautions, H&P , NPO status , Patient's Chart, lab work & pertinent test results, reviewed documented beta blocker date and time   Airway Mallampati: Intubated  TM Distance: >3 FB Neck ROM: full    Dental  (+) Teeth Intact   Pulmonary neg pulmonary ROS, pneumonia, COPD, Current Smoker   Pulmonary exam normal        Cardiovascular Exercise Tolerance: Poor + CAD  negative cardio ROS Normal cardiovascular exam Rhythm:regular Rate:Normal     Neuro/Psych  PSYCHIATRIC DISORDERS Anxiety Depression     Neuromuscular disease negative neurological ROS  negative psych ROS   GI/Hepatic negative GI ROS, Neg liver ROS,GERD  Medicated,,(+) Hepatitis -  Endo/Other  negative endocrine ROS    Renal/GU negative Renal ROS  negative genitourinary   Musculoskeletal  (+) Arthritis ,  Fibromyalgia -  Abdominal   Peds  Hematology negative hematology ROS (+)   Anesthesia Other Findings Past Medical History: No date: Arthritis     Comment:  RA No date: Depression No date: Fibromyalgia No date: GERD (gastroesophageal reflux disease) No date: H/O drug dependence (HCC)     Comment:  opiods No date: Wears dentures     Comment:  full upper Past Surgical History: No date: BREAST SURGERY     Comment:  Silicone implants, then removal 05/06/2019: CATARACT EXTRACTION W/PHACO; Left     Comment:  Procedure: CATARACT EXTRACTION PHACO AND INTRAOCULAR               LENS PLACEMENT (IOC) LEFT;  Surgeon: Galen Manila,               MD;  Location: [facility];  Service:               Ophthalmology;  Laterality: Left; 04/04/2023: IR GASTROSTOMY TUBE MOD SED 04/04/2023: IR US LIVER BIOPSY No date: TUBAL LIGATION BMI    Body Mass Index: 16.62 kg/m     Reproductive/Obstetrics negative OB ROS                              Anesthesia Physical Anesthesia Plan  ASA: 4  Anesthesia Plan: General ETT   Post-op Pain Management:    Induction:   PONV Risk Score and Plan: 3  Airway Management Planned:   Additional Equipment:   Intra-op Plan:   Post-operative Plan:   Informed Consent: I have reviewed the patients History and Physical, chart, labs and discussed the procedure including the risks, benefits and alternatives for the proposed anesthesia with the patient or authorized representative who has indicated his/her understanding and acceptance.     Dental Advisory Given  Plan Discussed with: CRNA  Anesthesia Plan Comments: (Left a message with [name], primary contact, for discussion. He has given consent for the procedure.  I have left my Hospital phone access should he have any questions. Dr. Pernell Dupre  Was able to speak with [name] at approx. [phone] and received additional consent for anesthesia. ja)       Anesthesia Quick Evaluation

## 2023-04-10 NOTE — Op Note (Signed)
04/10/2023 8:42 AM  [name] [id number]  Pre-Op Dx: Acute on chronic respiratory failure with hypoxia and hypercapnia Post-Op Dx:  Same  Proc:  Tracheostomy  Surg:  [name]  Asst: Vaught  Anes:  GOT  EBL:  <20ml  Comp:  none  Findings:  normal anatomy anterior neck  Procedure:  The patient was brought from the intensive care unit to the operating room and transferred to an operating table.  Anesthesia was administered per indwelling orotracheal tube.   Neck extension was achieved as possible anda shoulder rolke was placed.  The lower neck was palpated with the findings as described above.  1% Xylocaine with 1:100,000 epinephrine, 3 cc's, was infiltrated into the surgical field for intraoperative hemostasis.  Several minutes were allowed for this to take effect. The patient was prepped in a sterile fashion with a surgical prep from the chin down to the upper chest.  Sterile draping was accomplished in the standard fashion.  A  3 cm horizontal incision was made sharply a finger's breadth above the sternal notch, and extended through skin and subcutaneous fat.  Using cautery, the superficial layer of the deep cervical fascia was lysed.  Additional dissection revealed the strap muscles.  The midline raphe was divided in two layers and the muscles retracted laterally.  The pretracheal plane was visualized.  This was entered bluntly.  The thyroid isthmus was isolated and divided with the Harmonic scalpel.  The thyroid gland was retracted to either side.  The anterior face of the trachea was cleared.  In the  3rd interspace, a transverse incision was made between cartilage rings into the tracheal lumen.  A 6 mm wide inferiorly based flap was generated and secured to the lower wound with a 4-0 vicryl suture.   A previously tested  # 6 Shiley cuffed tracheostomy tube was brought into the field.  With the endotracheal tube under direct visualization through the tracheostomy, it was  gently backed up.  The tracheostomy tube was inserted into the tracheal lumen.  Hemostasis was observed. The cuff was inflated and observed to be intact and containing pressure. The inner cannula was placed and ventilation assumed per tracheostomy tube.  Good chest wall motion was observed, and CO2 was documented per anesthesia.  The trach tube was secured in the standard fashion with trach ties. A 2-0 silk suture was used to secure the trach tube to the skin on both sides.  Hemostasis was observed again.  When satisfactory ventilation was assured, the orotracheal tube was removed.  At this point the procedure was completed.  The patient was returned to anesthesia, awakened as possible, and transferred back to the intensive care unit in stable condition.  Comment: 72 y.o. female with prolonged ventilation was the indication for today's procedure.  Anticipate a routine postoperative recovery including standard tracheal hygiene.  The sutures should be removed in 5 days.  When the patient no longer requires ventilator or pressure support, the cuff should be deflated.  Changing to an uncuffed tube and downsizing will be according to the clinical condition of the patient.   [name]  8:42 AM 04/10/2023

## 2023-04-10 NOTE — Progress Notes (Addendum)
Nutrition Follow Up Note   DOCUMENTATION CODES:   Severe malnutrition in context of chronic illness  INTERVENTION:   Vital 1.5@40ml /hr continuous   Free water flushes 30ml q4 hours to maintain tube patency   Regimen provides 1440kcal/day, 65g/day protein and 944ml/day of free water   Liquid MVI daily via tube   NUTRITION DIAGNOSIS:   Severe Malnutrition related to chronic illness (COPD, new cancer) as evidenced by severe fat depletion, severe muscle depletion. -ongoing   GOAL:   Provide needs based on ASPEN/SCCM guidelines -met   MONITOR:   Vent status, Labs, Weight trends, TF tolerance, I & O's, Skin  ASSESSMENT:   72 y.o female w/ significant PMH of COPD, Emphysema, GERD, RA, Fibromyalgia, tobacco abuse, h/o drug abuse (cocaine, opiate), EtOH Abuse, hep C, CAD, anxiety, depression and recent diagnosis of chest wall mass concerning for lymphadenopathy who is admitted with new CHF, COPD exacerbation, PNA, sepsis and shock.  -Pt s/p tracheostomy today -Pt s/p IR G-tube 5/1 (20 [place])   Pt remains sedated and ventilated. Pt s/p trach today. Pt continues to tolerate tube feeds at goal rate. Pt started having BMs; flexi-seal placed today. Refeed labs stable. Per chart, pt appears weight stable since admission.   Medications reviewed and include: colace, pepcid, nicotine, MVI, oxycodone, miralax, NaCl @50ml /hr, propofol   Labs reviewed: Na 127(L), K 4.4 wnl, creat <0.30(L), P 3.9 wnl, Mg 1.7 wnl Hgb 9.0(L), Hct 27.1(L) Cbgs- 77, 102 x 24 hrs   Patient is currently intubated on ventilator support MV: 6.8 L/min Temp (24hrs), Avg:100.4 F (38 C), Min:98.2 F (36.8 C), Max:101.7 F (38.7 C)  Propofol: 10.47 ml/hr- provides 276kcal/day   MAP- >80mmHg   UOP- 1765ml/day   Diet Order:   Diet Order             Diet NPO time specified  Diet effective midnight                  EDUCATION NEEDS:   No education needs have been identified at this time  Skin:   Skin Assessment: Reviewed RN Assessment (ecchymosis, incision neck)  Last BM:  5/7- type 6  Height:   Ht Readings from Last 1 Encounters:  03/31/23 5\' 1"  (1.549 m)    Weight:   Wt Readings from Last 1 Encounters:  04/10/23 39.9 kg    Ideal Body Weight:  47.7 kg  BMI:  Body mass index is 16.62 kg/m.  Estimated Nutritional Needs:   Kcal:  1200-1400kcal/day  Protein:  65-75g/day  Fluid:  1.0-1.2L/day  Betsey Holiday MS, RD, LDN Please refer to [facility] for RD and/or RD on-call/weekend/after hours pager

## 2023-04-10 NOTE — Plan of Care (Signed)
  Problem: Health Behavior/Discharge Planning: Goal: Ability to manage health-related needs will improve Outcome: Progressing   Problem: Clinical Measurements: Goal: Ability to maintain clinical measurements within normal limits will improve Outcome: Progressing Goal: Respiratory complications will improve Outcome: Progressing Goal: Cardiovascular complication will be avoided Outcome: Progressing   Problem: Health Behavior/Discharge Planning: Goal: Ability to manage health-related needs will improve Outcome: Progressing   Problem: Clinical Measurements: Goal: Ability to maintain clinical measurements within normal limits will improve Outcome: Progressing Goal: Respiratory complications will improve Outcome: Progressing Goal: Cardiovascular complication will be avoided Outcome: Progressing

## 2023-04-10 NOTE — Progress Notes (Signed)
NAME:  Vanessa Oneal, MRN:  [id number], DOB:  1951-02-08, LOS: 10 ADMISSION DATE:  03/31/2023, CONSULTATION DATE: 03/31/2023 REFERRING MD: Chiquita Loth CHIEF COMPLAINT: Acute respiratory distress   HPI  72 y.o female w/ significant PMH of COPD, Emphysema, GERD, RA, Fibromyalgia, tobacco abuse, h/o drug abuse (cocaine). EtOH Abuse, Anxiety and Depression and recent diagnosis of chest wall mass concerning for Lymphadenopathy who presented to the ED with chief complaints of acute respiratory distress.  On review of chart, patient recently presented to [facility] emergency room on 03/27/2023 with complaints of chest pain.  CTA chest and CT abdomen pelvis with contrast was obtained which showed extensive mediastinal soft tissue mass and a large retroperitoneal soft tissue mass favored to represent lymphoma. Patient was referred to outpatient oncology for further evaluation.  Patient was evaluated by oncologist Dr. Cathie Hoops on 03/29/2023 who recommended a PET scan and ultrasound-guided biopsy of the right supraclavicular lymphadenopathy for further evaluation.  She also had myeloma panel and peripheral blood flow cytometric and LDH obtained.  Prior to presenting to the ED, patient had been complaining of progressive shortness of breath  all day and was found with oxygen saturation of 66% on room air.  She was placed on a nonrebreather and given Solu-Medrol 125 and 2 albuterol DuoNebs prior to transporting to the ED for further evaluation.   ED Course: Initial vital signs showed HR of 77 beats/minute, BP 90/60 mm Hg, the RR 22 breaths/minute, and the oxygen saturation 78% on NRB and a temperature of 98.35F (36.8C).  He was noted to be in acute respiratory distress with increased work of breathing.  Due to concerns for impending respiratory arrest, patient was intubated for airway protection.  Pertinent Labs/Diagnostics Findings: Na+/ K+:130/4.0 Glucose:150 BUN/Cr.:20/0.50   WBC:10 Hgb/Hct: 10.0/30.5  PCT: 0.32 Lactic  acid: 2.3 COVID PCR: Negative, Troponin:  BNP: 418.2  pO2 82; pCO2 56; pH 7.4;  HCO3 34.7, %O2 Sat 97.9.   Patient given 30 cc/kg of fluids and started on broad-spectrum antibiotics Vanco cefepime and Flagyl for sepsis. PCCM consulted for admission.  Past Medical History  COPD, Emphysema, GERD, RA, Fibromyalgia, tobacco abuse, h/o drug abuse (cocaine). EtOH Abuse, Anxiety and Depression and recent diagnosis of chest wall mass concerning for Lymphadenopathy  Significant Hospital Events   4/27: Admitted to ICU with acute on chronic hypoxic hypercapnic respiratory failure  due to multifocal pneumonia requiring mechanical ventilation.  Pt failed extubation requiring reintubation  4/28: Pt remains mechanically intubated vent settings: FiO2 50%/PEEP 5.  Pt with severe anxiety on precedex and fentanyl gtts will start antianxiety medications per tubes   Consults:  Oncology  Procedures:  4/27: Intubation  Significant Diagnostic Tests:  4/27: Chest Xray>> Endotracheal tube tip about 3.5 cm superior to carina. Esophageal tube tip below the diaphragm but incompletely visualized. Diffuse bilateral interstitial and hazy lung opacity, either representing edema or diffuse lung infection. More focal nodular opacity in the right mid lung may be infectious/ inflammatory or neoplastic in etiology. 4/27: CTA Chest/Abdomen/Pelvis>>Moderate left and small right pleural effusions. Emphysema. Diffuse bilateral septal thickening suggests underlying edema. Heterogeneous bilateral airspace consolidations throughout the lungs suspicious for multifocal pneumonia. Extensive cystic/low-density right supraclavicular, mediastinal, hilar, upper abdominal and retroperitoneal adenopathy. Differential considerations include necrotic metastatic disease, infectious etiology such as tuberculous or nontuberculous mycobacterial infection, and lymphoproliferative disease. Endotracheal tube tip about 1.2 cm superior to carina. Esophageal  tube tip just beyond GE junction, suggest further advancement for more optimal positioning, there is moderate fluid distension of  the stomach Small volume abdominopelvic ascites. Aortic Atherosclerosis 4/28: Pt remains mechanically intubated vent settings: FiO2 50%/PEEP 5.  Pt with severe anxiety on precedex and fentanyl gtts will start antianxiety medications per tubes  4/29: severe COPD, alert, unable to wean from vent 5/1: s/p PEG and LN biopsy 5/2: remains on vent, severe end stage COPD, failure to wean from vent 5/3: remains on vent, severe COPD, failure to wean from vent 5/4: failure to wean from vent 5/6: Pt remains mechanically intubated heavily sedated with propofol/versed/fentanyl gtts due to severe anxiety/agitation pending trach placement 05/7 5/7: Pt underwent tracheostomy placement size #6 shiley per ENT.  Will start weaning sedatives   Interim History / Subjective:  As outlined above under significant events   Micro Data:  4/27: SARS-CoV-2 PCR> negative 4/27: Influenza PCR>>negative 4/27: Respiratory panel>>negative 4/27: Blood culture x2>>negative  4/27: MRSA PCR>>negative  4/27: Strep pneumo urinary antigen>>negative  4/27: Legionella urinary antigen>> 4/27: Respiratory>>negative  4/27: Respiratory viral panel>>negative  4/27: Pneumocystis>> 4/27: AFB>>negative 4/27: Fungus>>negative    Anti-infectives (From admission, onward)    Start     Dose/Rate Route Frequency Ordered Stop   04/01/23 0500  vancomycin (VANCOREADY) IVPB 500 mg/100 mL  Status:  Discontinued        500 mg 100 mL/hr over 60 Minutes Intravenous Every 24 hours 03/31/23 0643 04/01/23 1021   03/31/23 1800  ceFEPIme (MAXIPIME) 2 g in sodium chloride 0.9 % 100 mL IVPB  Status:  Discontinued        2 g 200 mL/hr over 30 Minutes Intravenous Every 12 hours 03/31/23 0643 04/04/23 1057   03/31/23 0100  vancomycin (VANCOCIN) IVPB 1000 mg/200 mL premix        1,000 mg 200 mL/hr over 60 Minutes Intravenous   Once 03/31/23 0048 03/31/23 0220   03/31/23 0100  ceFEPIme (MAXIPIME) 2 g in sodium chloride 0.9 % 100 mL IVPB        2 g 200 mL/hr over 30 Minutes Intravenous  Once 03/31/23 0048 03/31/23 0201      OBJECTIVE  Blood pressure 92/67, pulse 98, temperature 99.7 F (37.6 C), temperature source Bladder, resp. rate 18, height 5\' 1"  (1.549 m), weight 39.9 kg, SpO2 94 %.    Vent Mode: PRVC FiO2 (%):  [28 %] 28 % Set Rate:  [18 bmp] 18 bmp Vt Set:  [380 mL] 380 mL PEEP:  [5 cmH20] 5 cmH20 Plateau Pressure:  [12 cmH20-13 cmH20] 13 cmH20   Intake/Output Summary (Last 24 hours) at 04/10/2023 0953 Last data filed at 04/10/2023 0600 Gross per 24 hour  Intake 2588.51 ml  Output 1780 ml  Net 808.51 ml   Filed Weights   04/08/23 0500 04/09/23 0500 04/10/23 0500  Weight: 39.2 kg 37.6 kg 39.9 kg   Physical Examination  GENERAL: Acute on chronically-ill cachetic appearing female, NAD mechanically ventilated via tracheostomy  HEENT: Head atraumatic, normocephalic. No JVD. Size #6 shiley tracheostomy midline dressing dry/intact  LUNGS: Faint rhonchi throughout, even, non labored  CARDIOVASCULAR: Sinus tachycardia, s1s2, rrr, no m/r/g, 2+ radial/1+distal, no edema  ABDOMEN: +BS x4, soft, non distended  EXTREMITIES: Moves all extremities  NEUROLOGIC: Sedated, not following commands, withdraws from painful stimulation, PERRL SKIN: No obvious rash, lesion, or ulcer. Warm to touch  Labs   CBC: Recent Labs  Lab 04/05/23 0302 04/06/23 0415 04/07/23 0410 04/09/23 1117 04/10/23 0222  WBC 8.2 5.8 5.5 9.5 10.0  NEUTROABS  --   --   --  7.1 7.9*  HGB 10.3* 10.1*  9.5* 9.7* 9.0*  HCT 31.8* 30.5* 29.1* 29.6* 27.1*  MCV 94.4 94.1 95.4 95.2 94.8  PLT 274 233 233 286 263    Basic Metabolic Panel: Recent Labs  Lab 04/04/23 0408 04/05/23 0302 04/06/23 0415 04/07/23 0410 04/08/23 0524 04/09/23 0527 04/10/23 0222  NA 134* 132* 133* 133* 130* 129* 127*  K 4.2 4.4 4.3 3.8 3.9 4.0 4.4  CL 97* 93*  95* 95* 96* 91* 93*  CO2 31 34* 31 30 30 29 28   GLUCOSE 116* 123* 148* 154* 113* 133* 86  BUN 17 15 20 22 19 22 20   CREATININE 0.40* 0.45 0.39* 0.36* <0.30* 0.31* <0.30*  CALCIUM 8.6* 8.6* 8.6* 8.6* 8.2* 8.4* 7.8*  MG 2.0 1.8 1.9  --  1.7 1.8 1.7  PHOS 3.7 4.6 2.9  --  3.3  --  3.9   GFR: CrCl cannot be calculated (This lab value cannot be used to calculate CrCl because it is not a number: <0.30). Recent Labs  Lab 04/06/23 0415 04/07/23 0410 04/09/23 1117 04/10/23 0222  WBC 5.8 5.5 9.5 10.0    Liver Function Tests: No results for input(s): "AST", "ALT", "ALKPHOS", "BILITOT", "PROT", "ALBUMIN" in the last 168 hours.  No results for input(s): "LIPASE", "AMYLASE" in the last 168 hours. No results for input(s): "AMMONIA" in the last 168 hours.  ABG    Component Value Date/Time   PHART 7.4 03/31/2023 0028   PCO2ART 56 (H) 03/31/2023 0028   PO2ART 82 (L) 03/31/2023 0028   HCO3 34.7 (H) 03/31/2023 0028   O2SAT 97.9 03/31/2023 0028     Coagulation Profile: Recent Labs  Lab 04/04/23 0408 04/10/23 0222  INR 1.0 1.0    Cardiac Enzymes: No results for input(s): "CKTOTAL", "CKMB", "CKMBINDEX", "TROPONINI" in the last 168 hours.  HbA1C: HB A1C (BAYER DCA - WAIVED)  Date/Time Value Ref Range Status  01/05/2020 03:31 PM 4.8 <7.0 % Final    Comment:                                          Diabetic Adult            <7.0                                       Healthy Adult        4.3 - 5.7                                                           (DCCT/NGSP) American Diabetes Association's Summary of Glycemic Recommendations for Adults with Diabetes: Hemoglobin A1c <7.0%. More stringent glycemic goals (A1c <6.0%) may further reduce complications at the cost of increased risk of hypoglycemia.   11/19/2017 01:41 PM 4.9 <7.0 % Final    Comment:                                          Diabetic Adult            <7.0  Healthy Adult         4.3 - 5.7                                                           (DCCT/NGSP) American Diabetes Association's Summary of Glycemic Recommendations for Adults with Diabetes: Hemoglobin A1c <7.0%. More stringent glycemic goals (A1c <6.0%) may further reduce complications at the cost of increased risk of hypoglycemia.    Hgb A1c MFr Bld  Date/Time Value Ref Range Status  04/01/2023 03:59 AM 6.2 (H) 4.8 - 5.6 % Final    Comment:    (NOTE) Pre diabetes:          5.7%-6.4%  Diabetes:              >6.4%  Glycemic control for   <7.0% adults with diabetes   07/18/2022 01:12 AM 5.1 4.8 - 5.6 % Final    Comment:    (NOTE) Pre diabetes:          5.7%-6.4%  Diabetes:              >6.4%  Glycemic control for   <7.0% adults with diabetes     CBG: Recent Labs  Lab 04/09/23 1634 04/09/23 1916 04/09/23 2326 04/10/23 0334 04/10/23 0736  GLUCAP 132* 89 166* 102* 77    Review of Systems:   Unable to assess pt mechanically intubated   Past Medical History  She,  has a past medical history of Arthritis, Depression, Fibromyalgia, GERD (gastroesophageal reflux disease), H/O drug dependence (HCC), and Wears dentures.   Surgical History    Past Surgical History:  Procedure Laterality Date   BREAST SURGERY     Silicone implants, then removal   CATARACT EXTRACTION W/PHACO Left 05/06/2019   Procedure: CATARACT EXTRACTION PHACO AND INTRAOCULAR LENS PLACEMENT (IOC) LEFT;  Surgeon: Galen Manila, MD;  Location: [facility];  Service: Ophthalmology;  Laterality: Left;   IR GASTROSTOMY TUBE MOD SED  04/04/2023   IR US LIVER BIOPSY  04/04/2023   TUBAL LIGATION       Social History   reports that she has been smoking cigarettes. She has a 14.00 pack-year smoking history. She has never used smokeless tobacco. She reports that she does not currently use alcohol. She reports that she does not use drugs.   Family History   Her family history includes COPD in her sister; Suicidality in  her father.   Allergies Allergies  Allergen Reactions   Gabapentin Anaphylaxis     Home Medications  Prior to Admission medications   Medication Sig Start Date End Date Taking? Authorizing Provider  albuterol (VENTOLIN HFA) 108 (90 Base) MCG/ACT inhaler Inhale 2 puffs into the lungs every 6 (six) hours as needed for wheezing or shortness of breath. Patient not taking: Reported on 03/29/2023 07/23/22   Kathrynn Running, MD  clonazePAM (KLONOPIN) 0.5 MG tablet Take by mouth.    [provider]  Fluticasone-Umeclidin-Vilant (TRELEGY ELLIPTA) 100-62.5-25 MCG/ACT AEPB Inhale 1 Inhalation into the lungs daily in the afternoon. Patient not taking: Reported on 03/29/2023 07/31/22   Olevia Perches P, DO  naloxone [facility]) nasal spray 4 mg/0.1 mL 1 spray into the nostril 1x, may repeat dose in alternate nostrils every 2-3 minutes until EMS arrives 03/29/23   Olevia Perches P, DO  oxyCODONE-acetaminophen (  PERCOCET) 10-325 MG tablet Take 1 tablet by mouth every 6 (six) hours as needed for up to 5 days for pain. 03/29/23 04/03/23  Laural Benes, Megan P, DO  promethazine (PHENERGAN) 25 MG tablet TAKE 1/2 TABLET BY MOUTH EVERY 8 HOURS AS NEEDED FOR NAUSEA AND VOMITING 07/31/22   Olevia Perches P, DO  Scheduled Meds:  [MAR Hold] budesonide (PULMICORT) nebulizer solution  0.5 mg Nebulization BID   [MAR Hold] Chlorhexidine Gluconate Cloth  6 each Topical Daily   [MAR Hold] clonazePAM  2 mg Per Tube BID   [MAR Hold] docusate  100 mg Per Tube BID   [MAR Hold] famotidine  20 mg Per Tube BID   [MAR Hold] feeding supplement (VITAL 1.5 CAL)  1,000 mL Per Tube Q24H   [MAR Hold] free water  30 mL Per Tube Q4H   [MAR Hold] insulin aspart  0-9 Units Subcutaneous Q4H   [MAR Hold] ipratropium-albuterol  3 mL Nebulization TID   [MAR Hold] multivitamin  15 mL Per Tube Daily   [MAR Hold] nicotine  21 mg Transdermal Daily   [MAR Hold] mouth rinse  15 mL Mouth Rinse Q2H   [MAR Hold] oxyCODONE  10 mg Per Tube Q6H   [MAR  Hold] polyethylene glycol  17 g Per Tube BID   [MAR Hold] QUEtiapine  25 mg Per Tube BID   [MAR Hold] sodium chloride flush  10-40 mL Intracatheter Q12H   Continuous Infusions:  sodium chloride Stopped (04/09/23 1115)   sodium chloride 50 mL/hr at 04/10/23 0950   fentaNYL infusion INTRAVENOUS 150 mcg/hr (04/10/23 0605)   [MAR Hold] magnesium sulfate bolus IVPB 2 g (04/10/23 0939)   midazolam 10 mg/hr (04/10/23 0604)   [MAR Hold] propofol (DIPRIVAN) infusion 50 mcg/kg/min (04/10/23 0731)   PRN Meds:.[MAR Hold] acetaminophen, [MAR Hold] bisacodyl, [MAR Hold] docusate sodium, [MAR Hold] fentaNYL, [MAR Hold] ipratropium-albuterol, [MAR Hold] lactulose, [MAR Hold] midazolam, [MAR Hold] ondansetron (ZOFRAN) IV, [MAR Hold] mouth rinse, [MAR Hold] simethicone, [MAR Hold] sodium chloride flush   Active Hospital Problem list   Hyperkalemia   Assessment & Plan:  #Acute on Chronic Hypoxic and Hypercapnic Respiratory Failure #Bilateral Pleural Effusion #Multifocal Pneumonia #Acute Exacerbation of COPD  Background emphysema and COPD, on triple therapy at home. Now with hypercapnic respiratory failure requiring intubation.  Repeat CT shows bilateral  dense consolidations concerning for multifocal pneumonia and pleural effusion and Extensive cystic/low-density adenopathy - Full vent support, implement lung protective strategies - Plateau pressures less than 30 cm H20 - Wean FiO2 & PEEP as tolerated to maintain O2 sats >92% - s/p bronchoscopy 03/31/23 - Follow intermittent Chest X-ray & ABG as needed - Spontaneous Breathing Trials when respiratory parameters met and mental status permits - Implement VAP Bundle - Bronchodilators and Pulmicort nebs - S/P tracheostomy placement size #6 shiley 04/10/23  #Sepsis secondary to multifocal pneumonia meets SIRS criteria: Heart Rate 112 beats/minute, Respiratory Rate 24 breaths/minute,Temperature 95.8 - Supplemental oxygen as needed, to maintain SpO2 > 90% -  Trend WBC and monitor fever curve  - Completed coarse of abx   #Elevated suspect secondary to demand ischemia  #Grade I diastolic dysfunction  No dynamic EKG changes Echo 04/01/23: EF 35 to 40%; grade I diastolic dysfunction; mild mitral valve regurgitation; mild to moderate aortic valve regurgitation  - Continuous telemetry monitoring  - Troponin's peaked at 871  #Hyponatremia  #Hypomagnesia   - Trend BMP - Replace electrolytes as indicated  - Monitor UOP  - Continue maintenance iv fluids   #Lymphadenopathy  #  Mediastinal Mass CT chest, Abd/Pelvis shows extensive cystic/low-density right supraclavicular, mediastinal, hilar, upper abdominal and retroperitoneal adenopathy concerning for necrotic metastatic disease, infectious etiology such as tuberculous or nontuberculous mycobacterial infection, and lymphoproliferative disease. -myeloma panel, peripheral blood flow cytometry,  pending from prior work up on 4/25 per oncology  - Lymph node biopsy results from 04/04/23 pending  - Will need PET scan in outpatient setting  #Hyperglycemia likely steroid induced  - CBG's q4hrs  - SSI   #Severe anxiety  #Mechanical ventilation pain/discomfort  - Maintain RASS goal 0 to -1 - PAD protocol to maintain RASS goal: Propofol/versed/fentanyl gtts  - Continue scheduled klonopin (dose increased to 2 mg bid 05/6), oxycodone, and seroquel  - WUA daily   Best practice:  Diet:  NPO; Dietitian consulted to start TF's  Pain/Anxiety/Delirium protocol (if indicated): Yes (RASS goal -1) VAP protocol (if indicated): Yes DVT prophylaxis: Subcutaneous Heparin GI prophylaxis: H2B Glucose control:  SSI Yes Central venous access:  N/A Arterial line:  N/A Foley:  Yes, and it is still needed Mobility:  bed rest  PT consulted: N/A Last date of multidisciplinary goals of care discussion [04/10/2023] Code Status:  full code Disposition: ICU  05/7: Will update pts son via telephone regarding pts condition  and current plan of care   Critical care time: 35 minutes    Zada Girt, AGNP  Pulmonary/Critical Care Pager [phone] (please enter 7 digits) PCCM Consult Pager [phone] (please enter 7 digits)

## 2023-04-11 ENCOUNTER — Encounter: Payer: Self-pay | Admitting: Unknown Physician Specialty

## 2023-04-11 DIAGNOSIS — J9621 Acute and chronic respiratory failure with hypoxia: Secondary | ICD-10-CM | POA: Diagnosis not present

## 2023-04-11 DIAGNOSIS — J189 Pneumonia, unspecified organism: Secondary | ICD-10-CM | POA: Diagnosis not present

## 2023-04-11 DIAGNOSIS — J9622 Acute and chronic respiratory failure with hypercapnia: Secondary | ICD-10-CM | POA: Diagnosis not present

## 2023-04-11 DIAGNOSIS — J441 Chronic obstructive pulmonary disease with (acute) exacerbation: Secondary | ICD-10-CM | POA: Diagnosis not present

## 2023-04-11 LAB — GLUCOSE, CAPILLARY
Glucose-Capillary: 151 mg/dL — ABNORMAL HIGH (ref 70–99)
Glucose-Capillary: 110 mg/dL — ABNORMAL HIGH (ref 70–99)
Glucose-Capillary: 174 mg/dL — ABNORMAL HIGH (ref 70–99)
Glucose-Capillary: 187 mg/dL — ABNORMAL HIGH (ref 70–99)
Glucose-Capillary: 116 mg/dL — ABNORMAL HIGH (ref 70–99)
Glucose-Capillary: 165 mg/dL — ABNORMAL HIGH (ref 70–99)

## 2023-04-11 LAB — MAGNESIUM: Magnesium: 1.7 mg/dL (ref 1.7–2.4)

## 2023-04-11 LAB — CBC
HCT: 27.4 % — ABNORMAL LOW (ref 36.0–46.0)
Hemoglobin: 9 g/dL — ABNORMAL LOW (ref 12.0–15.0)
MCH: 31.4 pg (ref 26.0–34.0)
MCHC: 32.8 g/dL (ref 30.0–36.0)
MCV: 95.5 fL (ref 80.0–100.0)
Platelets: 268 10*3/uL (ref 150–400)
RBC: 2.87 MIL/uL — ABNORMAL LOW (ref 3.87–5.11)
RDW: 13.6 % (ref 11.5–15.5)
WBC: 9.2 10*3/uL (ref 4.0–10.5)
nRBC: 0 % (ref 0.0–0.2)

## 2023-04-11 LAB — BASIC METABOLIC PANEL
Anion gap: 6 (ref 5–15)
BUN: 15 mg/dL (ref 8–23)
CO2: 29 mmol/L (ref 22–32)
Calcium: 7.8 mg/dL — ABNORMAL LOW (ref 8.9–10.3)
Chloride: 95 mmol/L — ABNORMAL LOW (ref 98–111)
Creatinine, Ser: <0.3 mg/dL — ABNORMAL LOW (ref 0.44–1.00)
Glucose, Bld: 128 mg/dL — ABNORMAL HIGH (ref 70–99)
Potassium: 3.7 mmol/L (ref 3.5–5.1)
Sodium: 130 mmol/L — ABNORMAL LOW (ref 135–145)

## 2023-04-11 LAB — PHOSPHORUS: Phosphorus: 2.8 mg/dL (ref 2.5–4.6)

## 2023-04-11 MED ORDER — DEXMEDETOMIDINE HCL IN NACL 400 MCG/100ML IV SOLN
0.0000 ug/kg/h | INTRAVENOUS | Status: DC
  Administered 2023-04-11: 0.6 ug/kg/h via INTRAVENOUS
  Administered 2023-04-11 – 2023-04-15 (×11): 1.2 ug/kg/h via INTRAVENOUS
  Administered 2023-04-16 – 2023-04-17 (×5): 1.3 ug/kg/h via INTRAVENOUS
  Administered 2023-04-17: 1.6 ug/kg/h via INTRAVENOUS
  Administered 2023-04-18 (×2): 1.2 ug/kg/h via INTRAVENOUS
  Administered 2023-04-18: 1.4 ug/kg/h via INTRAVENOUS
  Administered 2023-04-18: 1.2 ug/kg/h via INTRAVENOUS
  Administered 2023-04-19 – 2023-04-20 (×3): 1 ug/kg/h via INTRAVENOUS
  Administered 2023-04-20: 1.1 ug/kg/h via INTRAVENOUS
  Filled 2023-04-11 (×29): qty 100

## 2023-04-11 MED ORDER — CLONAZEPAM 0.5 MG PO TABS
2.0000 mg | ORAL_TABLET | Freq: Three times a day (TID) | ORAL | Status: DC
  Administered 2023-04-11 (×2): 2 mg
  Filled 2023-04-11 (×2): qty 4

## 2023-04-11 MED ORDER — HEPARIN SODIUM (PORCINE) 5000 UNIT/ML IJ SOLN
5000.0000 [IU] | Freq: Three times a day (TID) | INTRAMUSCULAR | Status: DC
  Administered 2023-04-11 – 2023-04-17 (×18): 5000 [IU] via SUBCUTANEOUS
  Filled 2023-04-11 (×18): qty 1

## 2023-04-11 MED ORDER — QUETIAPINE FUMARATE 25 MG PO TABS
75.0000 mg | ORAL_TABLET | Freq: Once | ORAL | Status: AC
  Administered 2023-04-11: 75 mg
  Filled 2023-04-11: qty 3

## 2023-04-11 MED ORDER — QUETIAPINE FUMARATE 100 MG PO TABS
100.0000 mg | ORAL_TABLET | Freq: Two times a day (BID) | ORAL | Status: DC
  Administered 2023-04-11 – 2023-04-12 (×2): 100 mg
  Filled 2023-04-11 (×2): qty 1

## 2023-04-11 MED ORDER — MAGNESIUM SULFATE 4 GM/100ML IV SOLN
4.0000 g | Freq: Once | INTRAVENOUS | Status: AC
  Administered 2023-04-11: 4 g via INTRAVENOUS
  Filled 2023-04-11: qty 100

## 2023-04-11 NOTE — Progress Notes (Signed)
Pt removed from Ventilator and placed on Trach Collar 5L 28%.

## 2023-04-11 NOTE — Anesthesia Postprocedure Evaluation (Signed)
Anesthesia Post Note  Patient: Vanessa Oneal  Procedure(s) Performed: TRACHEOSTOMY  Patient location during evaluation: ICU Anesthesia Type: General Level of consciousness: sedated Pain management: pain level controlled Vital Signs Assessment: post-procedure vital signs reviewed and stable Respiratory status: patient on ventilator - see flowsheet for VS Cardiovascular status: stable Postop Assessment: no apparent nausea or vomiting Anesthetic complications: no  No notable events documented.   Last Vitals:  Vitals:   04/11/23 0600 04/11/23 0715  BP: 97/61 94/61  Pulse: (!) 103 100  Resp: (!) 22 19  Temp: (!) 38.3 C (!) 38.2 C  SpO2: 99% 96%    Last Pain:  Vitals:   04/11/23 0715  TempSrc:   PainSc: 0-No pain                 Starling Manns

## 2023-04-11 NOTE — Progress Notes (Signed)
Placed patient back on ventilator with previous settings of 10/5 @ 28% for the night per MD orders.

## 2023-04-11 NOTE — Consult Note (Signed)
PHARMACY CONSULT NOTE  Pharmacy Consult for Electrolyte Monitoring and Replacement   Recent Labs: Potassium (mmol/L)  Date Value  04/11/2023 3.7  01/03/2014 3.6   Magnesium (mg/dL)  Date Value  78/29/5621 1.7  01/03/2014 1.9   Calcium (mg/dL)  Date Value  30/86/5784 7.8 (L)   Calcium, Total (mg/dL)  Date Value  69/62/9528 7.4 (L)   Albumin (g/dL)  Date Value  41/32/4401 2.5 (L)  07/31/2022 2.9 (L)  01/01/2014 3.2 (L)   Phosphorus (mg/dL)  Date Value  02/72/5366 2.8   Sodium (mmol/L)  Date Value  04/11/2023 130 (L)  07/31/2022 137  01/03/2014 139     Assessment: 72 y.o female w/ significant PMH of COPD, Emphysema, GERD, RA, Fibromyalgia, tobacco abuse, h/o drug abuse (cocaine). EtOH Abuse, Anxiety and Depression and recent diagnosis of chest wall mass concerning for Lymphadenopathy who presented to the ED with chief complaints of acute respiratory distress. Patient currently intubated and transferred to ICU. Pharmacy has been consulted to manage and replace electrolytes while under PCCM care.  Diet/Nutrition: Vital 1.5 at 40 mL/hr. Free water 30 ml q4H.   Goal of Therapy:  Electrolytes within normal limits  Plan:  --Na 130 / Cl 93. Continue to monitor --Mg 1.7, magnesium sulfate 4 g IV x 1 --Follow-up electrolytes with AM labs tomorrow  Bettey Costa 04/11/2023 8:12 AM

## 2023-04-11 NOTE — Progress Notes (Signed)
Pt failed Trach Collar wean, <<sats with >>agitation

## 2023-04-11 NOTE — Progress Notes (Signed)
NAME:  Vanessa Oneal, MRN:  [id number], DOB:  1951/03/20, LOS: 11 ADMISSION DATE:  03/31/2023, CHIEF COMPLAINT:  respiratory failure   History of Present Illness:  72 y.o female w/ significant PMH of COPD, Emphysema, GERD, RA, Fibromyalgia, tobacco abuse, h/o drug abuse (cocaine). EtOH Abuse, Anxiety and Depression and recent diagnosis of chest wall mass concerning for Lymphadenopathy who presented to the ED with chief complaints of acute respiratory distress.   On review of chart, patient recently presented to [facility] emergency room on 03/27/2023 with complaints of chest pain.  CTA chest and CT abdomen pelvis with contrast was obtained which showed extensive mediastinal soft tissue mass and a large retroperitoneal soft tissue mass favored to represent lymphoma. Patient was referred to outpatient oncology for further evaluation.  Patient was evaluated by oncologist Dr. Cathie Hoops on 03/29/2023 who recommended a PET scan and ultrasound-guided biopsy of the right supraclavicular lymphadenopathy for further evaluation.  She also had myeloma panel and peripheral blood flow cytometric and LDH obtained.  Prior to presenting to the ED, patient had been complaining of progressive shortness of breath  all day and was found with oxygen saturation of 66% on room air.  She was placed on a nonrebreather and given Solu-Medrol 125 and 2 albuterol DuoNebs prior to transporting to the ED for further evaluation.   ED Course: Initial vital signs showed HR of 77 beats/minute, BP 90/60 mm Hg, the RR 22 breaths/minute, and the oxygen saturation 78% on NRB and a temperature of 98.20F (36.8C).  He was noted to be in acute respiratory distress with increased work of breathing.  Due to concerns for impending respiratory arrest, patient was intubated for airway protection.   Pertinent Labs/Diagnostics Findings: Na+/ K+:130/4.0 Glucose:150 BUN/Cr.:20/0.50   WBC:10 Hgb/Hct: 10.0/30.5  PCT: 0.32 Lactic acid: 2.3 COVID PCR: Negative,  Troponin:  BNP: 418.2  pO2 82; pCO2 56; pH 7.4;  HCO3 34.7, %O2 Sat 97.9.    Patient given 30 cc/kg of fluids and started on broad-spectrum antibiotics Vanco cefepime and Flagyl for sepsis. PCCM consulted for admission.  Pertinent  Medical History  COPD, Emphysema, GERD, RA, Fibromyalgia, tobacco abuse, h/o drug abuse (cocaine). EtOH Abuse, Anxiety and Depression and recent diagnosis of chest wall mass concerning for Lymphadenopathy    Significant Hospital Events: Including procedures, antibiotic start and stop dates in addition to other pertinent events   4/27: Chest Xray>> Endotracheal tube tip about 3.5 cm superior to carina. Esophageal tube tip below the diaphragm but incompletely visualized. Diffuse bilateral interstitial and hazy lung opacity, either representing edema or diffuse lung infection. More focal nodular opacity in the right mid lung may be infectious/ inflammatory or neoplastic in etiology. 4/27: CTA Chest/Abdomen/Pelvis>>Moderate left and small right pleural effusions. Emphysema. Diffuse bilateral septal thickening suggests underlying edema. Heterogeneous bilateral airspace consolidations throughout the lungs suspicious for multifocal pneumonia. Extensive cystic/low-density right supraclavicular, mediastinal, hilar, upper abdominal and retroperitoneal adenopathy. Differential considerations include necrotic metastatic disease, infectious etiology such as tuberculous or nontuberculous mycobacterial infection, and lymphoproliferative disease. Endotracheal tube tip about 1.2 cm superior to carina. Esophageal tube tip just beyond GE junction, suggest further advancement for more optimal positioning, there is moderate fluid distension of the stomach Small volume abdominopelvic ascites. Aortic Atherosclerosis 4/28: Pt remains mechanically intubated vent settings: FiO2 50%/PEEP 5.  Pt with severe anxiety on precedex and fentanyl gtts will start antianxiety medications per tubes  4/29:  severe COPD, alert, unable to wean from vent 5/1: s/p PEG and LN biopsy 5/2: remains on vent,  severe end stage COPD, failure to wean from vent 5/3: remains on vent, severe COPD, failure to wean from vent 5/4: failure to wean from vent 5/6: Pt remains mechanically intubated heavily sedated with propofol/versed/fentanyl gtts due to severe anxiety/agitation pending trach placement 05/7 5/7: Pt underwent tracheostomy placement size #6 shiley per ENT.  Will start weaning sedatives   Interim History / Subjective:  Patient sedated, on the ventilator. Eyes open, tracks.  Objective   Blood pressure 102/63, pulse (!) 102, temperature (!) 101.1 F (38.4 C), resp. rate (!) 25, height 5\' 1"  (1.549 m), weight 40.7 kg, SpO2 94 %.    Vent Mode: PSV FiO2 (%):  [28 %] 28 % PEEP:  [5 cmH20] 5 cmH20 Pressure Support:  [10 cmH20] 10 cmH20 Plateau Pressure:  [14 cmH20-16 cmH20] 16 cmH20   Intake/Output Summary (Last 24 hours) at 04/11/2023 1144 Last data filed at 04/11/2023 0443 Gross per 24 hour  Intake 1961.71 ml  Output 1390 ml  Net 571.71 ml   Filed Weights   04/09/23 0500 04/10/23 0500 04/11/23 0511  Weight: 37.6 kg 39.9 kg 40.7 kg    Examination: Physical Exam Constitutional:      General: She is not in acute distress.    Appearance: She is ill-appearing.     Comments: Cachectic   HENT:     Mouth/Throat:     Comments: Tracheostomy tube in place Cardiovascular:     Rate and Rhythm: Normal rate and regular rhythm.     Heart sounds: Normal heart sounds.  Pulmonary:     Comments: Decreased breath sounds on anterior auscultation Abdominal:     Palpations: Abdomen is soft.  Neurological:     Mental Status: She is disoriented.      Assessment & Plan:   Patient is a 72 year old female with a past medical history of COPD/emphysema presenting to the hospital with acute on chronic hypoxic and hypercapnic respiratory failure requiring intubation and mechanical ventilation.  Course has been  complicated by failure to wean off the ventilator secondary to extremely elevated sedation requirements.      Neurology #Anxiety #Analgo-sedation  She is requiring propofol, midazolam, and fentanyl drips for sedation and has failed multiple attempts at spontaneous breathing trials. Plan to switch over her IV meds to orals, begin sedation wean, and attempt to wean off the ventilator.  -increased clonazepam today to 2 mg tid -increased quetiapine to 100 mg bid -continue standing oxycodone -wean IV infusions until all off  Cardiovascular #HFrEF (Systolic and Diastolic dysfunction) #Demand ischemia  TTE from 04/01/2023 with systolic and diastolic dysfunction. Troponin elevated on presentation (peak 871), attributed to demand. Hemodynamically stable. Continue to monitor.  Pulmonary #COPD #Acute on Chronic Hypoxic and Hypercapnic Respiratory Failure  Presented with respiratory failure secondary to pneumonia and COPD exacerbation. Treated with antibiotics, steroids, and nebulizer therapy. She's failed to wean off the ventilator, and previously refused extubation during goals of care discussions. She is s/p tracheostomy yesterday, and we will proceed with SBT and work towards getting her on trach collar.  -continue with standing nebulizer therapy -trach collar as tolerated -SLP for speaking valve evaluation  Gastrointestinal  Continue tube feeds. H2B for SUP  Renal Kidney function at baseline. Continue to monitor and replete electrolytes as needed  Endocrine  ICU glycemic protocol  Hem/Onc #Metastatic Malignancy #Extensive Stage Small Cell Lung Cancer (neuroendocrine)  She had a lymph node biopsies secondary to concern for malignancy (with result returning positive for metastatic malignancy). Neuroendocrine/small cell lung cancer, extensive stage.  Patient is not a current candidate for any chemotherapy, and oncology will hold off on evaluating her at the moment, thou she could  benefit from being evaluated by them in the future.  ID  Finished course of antibiotics, monitor   Best Practice (right click and "Reselect all SmartList Selections" daily)   Diet/type: tubefeeds DVT prophylaxis: prophylactic heparin  GI prophylaxis: H2B Lines: Central line and yes and it is still needed Foley:  Yes, and it is still needed Code Status:  full code Last date of multidisciplinary goals of care discussion [04/11/2023]  Labs   CBC: Recent Labs  Lab 04/06/23 0415 04/07/23 0410 04/09/23 1117 04/10/23 0222 04/11/23 0431  WBC 5.8 5.5 9.5 10.0 9.2  NEUTROABS  --   --  7.1 7.9*  --   HGB 10.1* 9.5* 9.7* 9.0* 9.0*  HCT 30.5* 29.1* 29.6* 27.1* 27.4*  MCV 94.1 95.4 95.2 94.8 95.5  PLT 233 233 286 263 268    Basic Metabolic Panel: Recent Labs  Lab 04/05/23 0302 04/06/23 0415 04/07/23 0410 04/08/23 0524 04/09/23 0527 04/10/23 0222 04/11/23 0431  NA 132* 133* 133* 130* 129* 127* 130*  K 4.4 4.3 3.8 3.9 4.0 4.4 3.7  CL 93* 95* 95* 96* 91* 93* 95*  CO2 34* 31 30 30 29 28 29   GLUCOSE 123* 148* 154* 113* 133* 86 128*  BUN 15 20 22 19 22 20 15   CREATININE 0.45 0.39* 0.36* <0.30* 0.31* <0.30* <0.30*  CALCIUM 8.6* 8.6* 8.6* 8.2* 8.4* 7.8* 7.8*  MG 1.8 1.9  --  1.7 1.8 1.7 1.7  PHOS 4.6 2.9  --  3.3  --  3.9 2.8   GFR: CrCl cannot be calculated (This lab value cannot be used to calculate CrCl because it is not a number: <0.30). Recent Labs  Lab 04/07/23 0410 04/09/23 1117 04/10/23 0222 04/11/23 0431  WBC 5.5 9.5 10.0 9.2    Liver Function Tests: No results for input(s): "AST", "ALT", "ALKPHOS", "BILITOT", "PROT", "ALBUMIN" in the last 168 hours. No results for input(s): "LIPASE", "AMYLASE" in the last 168 hours. No results for input(s): "AMMONIA" in the last 168 hours.  ABG    Component Value Date/Time   PHART 7.4 03/31/2023 0028   PCO2ART 56 (H) 03/31/2023 0028   PO2ART 82 (L) 03/31/2023 0028   HCO3 34.7 (H) 03/31/2023 0028   O2SAT 97.9 03/31/2023  0028     Coagulation Profile: Recent Labs  Lab 04/10/23 0222  INR 1.0    Cardiac Enzymes: No results for input(s): "CKTOTAL", "CKMB", "CKMBINDEX", "TROPONINI" in the last 168 hours.  HbA1C: HB A1C (BAYER DCA - WAIVED)  Date/Time Value Ref Range Status  01/05/2020 03:31 PM 4.8 <7.0 % Final    Comment:                                          Diabetic Adult            <7.0                                       Healthy Adult        4.3 - 5.7                                                           (  DCCT/NGSP) American Diabetes Association's Summary of Glycemic Recommendations for Adults with Diabetes: Hemoglobin A1c <7.0%. More stringent glycemic goals (A1c <6.0%) may further reduce complications at the cost of increased risk of hypoglycemia.   11/19/2017 01:41 PM 4.9 <7.0 % Final    Comment:                                          Diabetic Adult            <7.0                                       Healthy Adult        4.3 - 5.7                                                           (DCCT/NGSP) American Diabetes Association's Summary of Glycemic Recommendations for Adults with Diabetes: Hemoglobin A1c <7.0%. More stringent glycemic goals (A1c <6.0%) may further reduce complications at the cost of increased risk of hypoglycemia.    Hgb A1c MFr Bld  Date/Time Value Ref Range Status  04/01/2023 03:59 AM 6.2 (H) 4.8 - 5.6 % Final    Comment:    (NOTE) Pre diabetes:          5.7%-6.4%  Diabetes:              >6.4%  Glycemic control for   <7.0% adults with diabetes   07/18/2022 01:12 AM 5.1 4.8 - 5.6 % Final    Comment:    (NOTE) Pre diabetes:          5.7%-6.4%  Diabetes:              >6.4%  Glycemic control for   <7.0% adults with diabetes     CBG: Recent Labs  Lab 04/10/23 1949 04/10/23 2329 04/11/23 0339 04/11/23 0713 04/11/23 1138  GLUCAP 99 118* 151* 110* 174*    Review of Systems:   Unable to obtain  Past Medical History:  She,  has a  past medical history of Arthritis, Depression, Fibromyalgia, GERD (gastroesophageal reflux disease), H/O drug dependence (HCC), and Wears dentures.   Surgical History:   Past Surgical History:  Procedure Laterality Date   BREAST SURGERY     Silicone implants, then removal   CATARACT EXTRACTION W/PHACO Left 05/06/2019   Procedure: CATARACT EXTRACTION PHACO AND INTRAOCULAR LENS PLACEMENT (IOC) LEFT;  Surgeon: Galen Manila, MD;  Location: [facility];  Service: Ophthalmology;  Laterality: Left;   IR GASTROSTOMY TUBE MOD SED  04/04/2023   IR US LIVER BIOPSY  04/04/2023   TRACHEOSTOMY TUBE PLACEMENT N/A 04/10/2023   Procedure: TRACHEOSTOMY;  Surgeon: Linus Salmons, MD;  Location: [facility];  Service: ENT;  Laterality: N/A;   TUBAL LIGATION       Social History:   reports that she has been smoking cigarettes. She has a 14.00 pack-year smoking history. She has never used smokeless tobacco. She reports that she does not currently use alcohol. She reports that she does not use drugs.   Family History:  Her family history includes COPD in her  sister; Suicidality in her father.   Allergies Allergies  Allergen Reactions   Gabapentin Anaphylaxis     Home Medications  Prior to Admission medications   Medication Sig Start Date End Date Taking? Authorizing Provider  naloxone [facility]) nasal spray 4 mg/0.1 mL 1 spray into the nostril 1x, may repeat dose in alternate nostrils every 2-3 minutes until EMS arrives 03/29/23  Yes Johnson, Megan P, DO  promethazine (PHENERGAN) 25 MG tablet TAKE 1/2 TABLET BY MOUTH EVERY 8 HOURS AS NEEDED FOR NAUSEA AND VOMITING 07/31/22  Yes Johnson, Megan P, DO  albuterol (VENTOLIN HFA) 108 (90 Base) MCG/ACT inhaler Inhale 2 puffs into the lungs every 6 (six) hours as needed for wheezing or shortness of breath. Patient not taking: Reported on 03/29/2023 07/23/22   Kathrynn Running, MD  clonazePAM (KLONOPIN) 0.5 MG tablet Take by mouth.    [provider]   Fluticasone-Umeclidin-Vilant (TRELEGY ELLIPTA) 100-62.5-25 MCG/ACT AEPB Inhale 1 Inhalation into the lungs daily in the afternoon. Patient not taking: Reported on 03/29/2023 07/31/22   Dorcas Carrow, DO     Critical care time: 39 minutes     Raechel Chute, MD [facility] 04/11/2023 2:56 PM

## 2023-04-11 NOTE — TOC Initial Note (Signed)
Transition of Care [facility]) - Initial/Assessment Note    Patient Details  Name: Vanessa Oneal MRN: [id number] Date of Birth: [birth year]  Transition of Care [facility]) CM/SW Contact:    Margarito Liner, LCSW Phone Number: 04/11/2023, 3:44 PM  Clinical Narrative:  MD asked about LTACH during ICU rounds this morning. [facility] has reviewed referral and feel that patient qualifies. They requested documentation that patient is doing well weaning from sedatives as well as 3 failed weaning attempts from vent. [facility] is reviewing referral.                Expected Discharge Plan: Long Term Acute Care (LTAC) Barriers to Discharge: Continued Medical Work up   Patient Goals and CMS Choice            Expected Discharge Plan and Services       Living arrangements for the past 2 months: Apartment                                      Prior Living Arrangements/Services Living arrangements for the past 2 months: Apartment   Patient language and need for interpreter reviewed:: Yes        Need for Family Participation in Patient Care: Yes (Comment) Care giver support system in place?: Yes (comment)   Criminal Activity/Legal Involvement Pertinent to Current Situation/Hospitalization: No - Comment as needed  Activities of Daily Living Home Assistive Devices/Equipment: Eyeglasses, Oxygen ADL Screening (condition at time of admission) Patient's cognitive ability adequate to safely complete daily activities?: Yes Is the patient deaf or have difficulty hearing?: No Does the patient have difficulty seeing, even when wearing glasses/contacts?: Yes Does the patient have difficulty concentrating, remembering, or making decisions?: No Patient able to express need for assistance with ADLs?: Yes Does the patient have difficulty dressing or bathing?: No Independently performs ADLs?: Yes (appropriate for developmental age) Does the patient have difficulty walking or climbing stairs?: No Weakness  of Legs: None Weakness of Arms/Hands: None  Permission Sought/Granted                  Emotional Assessment         Alcohol / Substance Use: Not Applicable Psych Involvement: No (comment)  Admission diagnosis:  COPD exacerbation (HCC) [J44.1] Acute respiratory failure with hypoxia (HCC) [J96.01] Acute on chronic respiratory failure with hypoxia and hypercapnia (HCC) [J96.21, J96.22] Multifocal pneumonia [J18.9] Sepsis, due to unspecified organism, unspecified whether acute organ dysfunction present [facility]) [A41.9] Patient Active Problem List   Diagnosis Date Noted   Acute respiratory failure with hypoxia (HCC) 04/10/2023   Sepsis (HCC) 04/10/2023   COPD exacerbation (HCC) 04/09/2023   Acute on chronic respiratory failure with hypoxia and hypercapnia (HCC) 03/31/2023   Oxygen dependent 03/29/2023   Hypoxia 03/29/2023   Mediastinal mass 03/29/2023   Lymphadenopathy 03/29/2023   Hyponatremia 03/29/2023   Protein-calorie malnutrition, severe 07/19/2022   Multifocal pneumonia 07/18/2022   Compression fracture of third lumbar vertebra (HCC) 11/15/2021   Benzodiazepine abuse (HCC) 06/06/2021   Alcohol abuse 06/06/2021   Aortic atherosclerosis (HCC) 01/07/2021   CAD (coronary artery disease) 02/28/2018   Centrilobular emphysema (HCC) 02/28/2018   Chronic hepatitis C without hepatic coma (HCC) 11/22/2017   Opiate abuse, continuous (HCC) 11/19/2017   Anxiety disorder 11/19/2017   Osteoarthritis 11/19/2017   Vitamin D deficiency, unspecified 05/30/2016   Malnutrition (HCC) 05/22/2016   PCP:  Dorcas Carrow, DO Pharmacy:  [facility] - [place], [place] - [address] [place] [address] Phone: [phone] Fax: [phone]     Social Determinants of Health (SDOH) Social History: SDOH Screenings   Food Insecurity: No Food Insecurity (03/31/2023)  Housing: Low Risk  (03/31/2023)  Transportation Needs: No Transportation  Needs (03/31/2023)  Utilities: Not At Risk (03/31/2023)  Alcohol Screen: Low Risk  (07/31/2022)  Depression (PHQ2-9): Medium Risk (07/31/2022)  Financial Resource Strain: Low Risk  (07/31/2022)  Physical Activity: Insufficiently Active (07/31/2022)  Social Connections: Socially Isolated (07/31/2022)  Stress: Stress Concern Present (07/31/2022)  Tobacco Use: High Risk (04/11/2023)   SDOH Interventions:     Readmission Risk Interventions     No data to display

## 2023-04-12 ENCOUNTER — Inpatient Hospital Stay: Payer: 59

## 2023-04-12 DIAGNOSIS — J441 Chronic obstructive pulmonary disease with (acute) exacerbation: Secondary | ICD-10-CM | POA: Diagnosis not present

## 2023-04-12 DIAGNOSIS — J9621 Acute and chronic respiratory failure with hypoxia: Secondary | ICD-10-CM | POA: Diagnosis not present

## 2023-04-12 DIAGNOSIS — J9622 Acute and chronic respiratory failure with hypercapnia: Secondary | ICD-10-CM | POA: Diagnosis not present

## 2023-04-12 DIAGNOSIS — J189 Pneumonia, unspecified organism: Secondary | ICD-10-CM | POA: Diagnosis not present

## 2023-04-12 DIAGNOSIS — I471 Supraventricular tachycardia, unspecified: Secondary | ICD-10-CM

## 2023-04-12 LAB — RENAL FUNCTION PANEL
Albumin: 2.2 g/dL — ABNORMAL LOW (ref 3.5–5.0)
Anion gap: 7 (ref 5–15)
BUN: 18 mg/dL (ref 8–23)
CO2: 26 mmol/L (ref 22–32)
Calcium: 7.9 mg/dL — ABNORMAL LOW (ref 8.9–10.3)
Chloride: 103 mmol/L (ref 98–111)
Creatinine, Ser: <0.3 mg/dL — ABNORMAL LOW (ref 0.44–1.00)
Glucose, Bld: 171 mg/dL — ABNORMAL HIGH (ref 70–99)
Phosphorus: 2.1 mg/dL — ABNORMAL LOW (ref 2.5–4.6)
Potassium: 3.3 mmol/L — ABNORMAL LOW (ref 3.5–5.1)
Sodium: 136 mmol/L (ref 135–145)

## 2023-04-12 LAB — CBC
HCT: 26.5 % — ABNORMAL LOW (ref 36.0–46.0)
Hemoglobin: 8.6 g/dL — ABNORMAL LOW (ref 12.0–15.0)
MCH: 30.6 pg (ref 26.0–34.0)
MCHC: 32.5 g/dL (ref 30.0–36.0)
MCV: 94.3 fL (ref 80.0–100.0)
Platelets: 266 10*3/uL (ref 150–400)
RBC: 2.81 MIL/uL — ABNORMAL LOW (ref 3.87–5.11)
RDW: 13.6 % (ref 11.5–15.5)
WBC: 10 10*3/uL (ref 4.0–10.5)
nRBC: 0 % (ref 0.0–0.2)

## 2023-04-12 LAB — GLUCOSE, CAPILLARY
Glucose-Capillary: 134 mg/dL — ABNORMAL HIGH (ref 70–99)
Glucose-Capillary: 148 mg/dL — ABNORMAL HIGH (ref 70–99)
Glucose-Capillary: 168 mg/dL — ABNORMAL HIGH (ref 70–99)
Glucose-Capillary: 151 mg/dL — ABNORMAL HIGH (ref 70–99)
Glucose-Capillary: 141 mg/dL — ABNORMAL HIGH (ref 70–99)
Glucose-Capillary: 146 mg/dL — ABNORMAL HIGH (ref 70–99)

## 2023-04-12 LAB — MAGNESIUM: Magnesium: 1.7 mg/dL (ref 1.7–2.4)

## 2023-04-12 LAB — BRAIN NATRIURETIC PEPTIDE: B Natriuretic Peptide: 634.5 pg/mL — ABNORMAL HIGH (ref 0.0–100.0)

## 2023-04-12 LAB — TRIGLYCERIDES: Triglycerides: 56 mg/dL (ref <150)

## 2023-04-12 MED ORDER — FUROSEMIDE 10 MG/ML IJ SOLN
20.0000 mg | Freq: Once | INTRAMUSCULAR | Status: AC
  Administered 2023-04-12: 20 mg via INTRAVENOUS
  Filled 2023-04-12: qty 2

## 2023-04-12 MED ORDER — POTASSIUM PHOSPHATES 15 MMOLE/5ML IV SOLN
15.0000 mmol | Freq: Once | INTRAVENOUS | Status: AC
  Administered 2023-04-12: 15 mmol via INTRAVENOUS
  Filled 2023-04-12: qty 5

## 2023-04-12 MED ORDER — CLONAZEPAM 1 MG PO TABS
4.0000 mg | ORAL_TABLET | Freq: Three times a day (TID) | ORAL | Status: DC
  Administered 2023-04-12: 4 mg
  Filled 2023-04-12: qty 4

## 2023-04-12 MED ORDER — POTASSIUM CHLORIDE 20 MEQ PO PACK
40.0000 meq | PACK | Freq: Once | ORAL | Status: AC
  Administered 2023-04-12: 40 meq
  Filled 2023-04-12: qty 2

## 2023-04-12 MED ORDER — MAGNESIUM SULFATE 4 GM/100ML IV SOLN
4.0000 g | Freq: Once | INTRAVENOUS | Status: AC
  Administered 2023-04-12: 4 g via INTRAVENOUS
  Filled 2023-04-12: qty 100

## 2023-04-12 MED ORDER — PROPOFOL 1000 MG/100ML IV EMUL
INTRAVENOUS | Status: AC
  Administered 2023-04-12: 5 ug/kg/min via INTRAVENOUS
  Filled 2023-04-12: qty 100

## 2023-04-12 MED ORDER — MIDAZOLAM BOLUS VIA INFUSION
2.0000 mg | Freq: Once | INTRAVENOUS | Status: AC
  Administered 2023-04-12: 2 mg via INTRAVENOUS
  Filled 2023-04-12: qty 2

## 2023-04-12 MED ORDER — PROPOFOL 1000 MG/100ML IV EMUL: 5.0000 ug/kg/min | INTRAVENOUS | Status: DC

## 2023-04-12 MED ORDER — HALOPERIDOL LACTATE 5 MG/ML IJ SOLN
5.0000 mg | Freq: Once | INTRAMUSCULAR | Status: AC
  Administered 2023-04-12: 5 mg via INTRAVENOUS
  Filled 2023-04-12: qty 1

## 2023-04-12 MED ORDER — QUETIAPINE FUMARATE 100 MG PO TABS
100.0000 mg | ORAL_TABLET | Freq: Three times a day (TID) | ORAL | Status: DC
  Administered 2023-04-12 – 2023-04-13 (×3): 100 mg
  Filled 2023-04-12 (×3): qty 1

## 2023-04-12 MED ORDER — DIAZEPAM 5 MG PO TABS
20.0000 mg | ORAL_TABLET | Freq: Two times a day (BID) | ORAL | Status: DC
  Administered 2023-04-12 – 2023-04-13 (×3): 20 mg
  Filled 2023-04-12 (×3): qty 4

## 2023-04-12 MED ORDER — FENTANYL CITRATE PF 50 MCG/ML IJ SOSY
50.0000 ug | PREFILLED_SYRINGE | INTRAMUSCULAR | Status: DC | PRN
  Administered 2023-04-12 (×2): 100 ug via INTRAVENOUS
  Filled 2023-04-12 (×2): qty 2

## 2023-04-12 NOTE — Progress Notes (Signed)
NAME:  Vanessa Oneal, MRN:  [id number], DOB:  1951/06/05, LOS: 12 ADMISSION DATE:  03/31/2023, CHIEF COMPLAINT:  respiratory failure   History of Present Illness:  72 y.o female w/ significant PMH of COPD, Emphysema, GERD, RA, Fibromyalgia, tobacco abuse, h/o drug abuse (cocaine). EtOH Abuse, Anxiety and Depression and recent diagnosis of chest wall mass concerning for Lymphadenopathy who presented to the ED with chief complaints of acute respiratory distress.   On review of chart, patient recently presented to [facility] emergency room on 03/27/2023 with complaints of chest pain.  CTA chest and CT abdomen pelvis with contrast was obtained which showed extensive mediastinal soft tissue mass and a large retroperitoneal soft tissue mass favored to represent lymphoma. Patient was referred to outpatient oncology for further evaluation.  Patient was evaluated by oncologist Dr. Cathie Hoops on 03/29/2023 who recommended a PET scan and ultrasound-guided biopsy of the right supraclavicular lymphadenopathy for further evaluation.  She also had myeloma panel and peripheral blood flow cytometric and LDH obtained.  Prior to presenting to the ED, patient had been complaining of progressive shortness of breath  all day and was found with oxygen saturation of 66% on room air.  She was placed on a nonrebreather and given Solu-Medrol 125 and 2 albuterol DuoNebs prior to transporting to the ED for further evaluation.   ED Course: Initial vital signs showed HR of 77 beats/minute, BP 90/60 mm Hg, the RR 22 breaths/minute, and the oxygen saturation 78% on NRB and a temperature of 98.49F (36.8C).  He was noted to be in acute respiratory distress with increased work of breathing.  Due to concerns for impending respiratory arrest, patient was intubated for airway protection.   Pertinent Labs/Diagnostics Findings: Na+/ K+:130/4.0 Glucose:150 BUN/Cr.:20/0.50   WBC:10 Hgb/Hct: 10.0/30.5  PCT: 0.32 Lactic acid: 2.3 COVID PCR: Negative,  Troponin:  BNP: 418.2  pO2 82; pCO2 56; pH 7.4;  HCO3 34.7, %O2 Sat 97.9.    Patient given 30 cc/kg of fluids and started on broad-spectrum antibiotics Vanco cefepime and Flagyl for sepsis. PCCM consulted for admission.  Pertinent  Medical History  COPD, Emphysema, GERD, RA, Fibromyalgia, tobacco abuse, h/o drug abuse (cocaine). EtOH Abuse, Anxiety and Depression and recent diagnosis of chest wall mass concerning for Lymphadenopathy  Significant Hospital Events: Including procedures, antibiotic start and stop dates in addition to other pertinent events   4/27: Chest Xray>> Endotracheal tube tip about 3.5 cm superior to carina. Esophageal tube tip below the diaphragm but incompletely visualized. Diffuse bilateral interstitial and hazy lung opacity, either representing edema or diffuse lung infection. More focal nodular opacity in the right mid lung may be infectious/ inflammatory or neoplastic in etiology. 4/27: CTA Chest/Abdomen/Pelvis>>Moderate left and small right pleural effusions. Emphysema. Diffuse bilateral septal thickening suggests underlying edema. Heterogeneous bilateral airspace consolidations throughout the lungs suspicious for multifocal pneumonia. Extensive cystic/low-density right supraclavicular, mediastinal, hilar, upper abdominal and retroperitoneal adenopathy. Differential considerations include necrotic metastatic disease, infectious etiology such as tuberculous or nontuberculous mycobacterial infection, and lymphoproliferative disease. Endotracheal tube tip about 1.2 cm superior to carina. Esophageal tube tip just beyond GE junction, suggest further advancement for more optimal positioning, there is moderate fluid distension of the stomach Small volume abdominopelvic ascites. Aortic Atherosclerosis 4/28: Pt remains mechanically intubated vent settings: FiO2 50%/PEEP 5.  Pt with severe anxiety on precedex and fentanyl gtts will start antianxiety medications per tubes  4/29:  severe COPD, alert, unable to wean from vent 5/1: s/p PEG and LN biopsy 5/2: remains on vent, severe end  stage COPD, failure to wean from vent 5/3: remains on vent, severe COPD, failure to wean from vent 5/4: failure to wean from vent 5/6: Pt remains mechanically intubated heavily sedated with propofol/versed/fentanyl gtts due to severe anxiety/agitation pending trach placement 05/7 5/7: Pt underwent tracheostomy placement size #6 shiley per ENT.  Will start weaning sedatives   Interim History / Subjective:  Patient awake, follows commands, on trach collar  Objective   Blood pressure 129/77, pulse (!) 110, temperature 98.5 F (36.9 C), resp. rate (!) 26, height 5\' 1"  (1.549 m), weight 40.7 kg, SpO2 (!) 82 %.    Vent Mode: PSV FiO2 (%):  [28 %] 28 % PEEP:  [5 cmH20] 5 cmH20 Pressure Support:  [10 cmH20] 10 cmH20 Plateau Pressure:  [19 cmH20] 19 cmH20   Intake/Output Summary (Last 24 hours) at 04/12/2023 0836 Last data filed at 04/12/2023 0615 Gross per 24 hour  Intake 2863.41 ml  Output 3575 ml  Net -711.59 ml    Filed Weights   04/09/23 0500 04/10/23 0500 04/11/23 0511  Weight: 37.6 kg 39.9 kg 40.7 kg    Examination: Physical Exam Constitutional:      General: She is not in acute distress.    Appearance: She is ill-appearing.     Comments: Cachectic   HENT:     Mouth/Throat:     Comments: Tracheostomy tube in place Cardiovascular:     Rate and Rhythm: Normal rate and regular rhythm.     Heart sounds: Normal heart sounds.  Pulmonary:     Comments: Decreased breath sounds on anterior auscultation. Increased respiratory rate, on trach collar Abdominal:     Palpations: Abdomen is soft.  Neurological:     Mental Status: She is disoriented.      Assessment & Plan:   Patient is a 72 year old female with a past medical history of COPD/emphysema presenting to the hospital with acute on chronic hypoxic and hypercapnic respiratory failure requiring intubation and mechanical  ventilation.  Course has been complicated by failure to wean off the ventilator secondary to extremely elevated sedation requirements.      Neurology #Anxiety #Analgo-sedation  She is now requiring dexmedetomidine and midazolam infusions for sedation, which we are attempting to wean off by increasing the dose of parenteral medications.  -increased clonazepam today to 4 mg tid -increased quetiapine to 100 mg bid -continue standing oxycodone -wean IV infusions until all off  Cardiovascular #HFrEF (Systolic and Diastolic dysfunction) #Demand ischemia  TTE from 04/01/2023 with systolic and diastolic dysfunction. Troponin elevated on presentation (peak 871), attributed to demand. Hemodynamically stable. Continue to monitor.  Pulmonary #COPD #Acute on Chronic Hypoxic and Hypercapnic Respiratory Failure  Presented with respiratory failure secondary to pneumonia and COPD exacerbation. Treated with antibiotics, steroids, and nebulizer therapy. She's failed to wean off the ventilator, and previously refused extubation during goals of care discussions. She is s/p tracheostomy, and did tolerated SBT and then trach collar yesterday. Was vented overnight, and will resume trach collar today.   -continue with standing nebulizer therapy -trach collar as tolerated -SLP for speaking valve evaluation  Gastrointestinal  Continue tube feeds. H2B for SUP  Renal Kidney function at baseline. Continue to monitor and replete electrolytes as needed  Endocrine  ICU glycemic protocol  Hem/Onc #Metastatic Malignancy #Extensive Stage Small Cell Lung Cancer (neuroendocrine)  She had a lymph node biopsies secondary to concern for malignancy (with result returning positive for metastatic malignancy). Neuroendocrine/small cell lung cancer, extensive stage. Patient is not a current candidate for  any chemotherapy, and oncology will hold off on evaluating her at the moment, thou she could benefit from being  evaluated by them in the future.  ID  Finished course of antibiotics, monitor   Best Practice (right click and "Reselect all SmartList Selections" daily)   Diet/type: tubefeeds DVT prophylaxis: prophylactic heparin  GI prophylaxis: H2B Lines: Central line and yes and it is still needed Foley:  Yes, and it is still needed Code Status:  full code Last date of multidisciplinary goals of care discussion [04/12/2023]  Labs   CBC: Recent Labs  Lab 04/07/23 0410 04/09/23 1117 04/10/23 0222 04/11/23 0431 04/12/23 0458  WBC 5.5 9.5 10.0 9.2 10.0  NEUTROABS  --  7.1 7.9*  --   --   HGB 9.5* 9.7* 9.0* 9.0* 8.6*  HCT 29.1* 29.6* 27.1* 27.4* 26.5*  MCV 95.4 95.2 94.8 95.5 94.3  PLT 233 286 263 268 266     Basic Metabolic Panel: Recent Labs  Lab 04/06/23 0415 04/07/23 0410 04/08/23 0524 04/09/23 0527 04/10/23 0222 04/11/23 0431 04/12/23 0458  NA 133*   < > 130* 129* 127* 130* 136  K 4.3   < > 3.9 4.0 4.4 3.7 3.3*  CL 95*   < > 96* 91* 93* 95* 103  CO2 31   < > 30 29 28 29 26   GLUCOSE 148*   < > 113* 133* 86 128* 171*  BUN 20   < > 19 22 20 15 18   CREATININE 0.39*   < > <0.30* 0.31* <0.30* <0.30* <0.30*  CALCIUM 8.6*   < > 8.2* 8.4* 7.8* 7.8* 7.9*  MG 1.9  --  1.7 1.8 1.7 1.7 1.7  PHOS 2.9  --  3.3  --  3.9 2.8 2.1*   < > = values in this interval not displayed.    GFR: CrCl cannot be calculated (This lab value cannot be used to calculate CrCl because it is not a number: <0.30). Recent Labs  Lab 04/09/23 1117 04/10/23 0222 04/11/23 0431 04/12/23 0458  WBC 9.5 10.0 9.2 10.0     Liver Function Tests: Recent Labs  Lab 04/12/23 0458  ALBUMIN 2.2*   No results for input(s): "LIPASE", "AMYLASE" in the last 168 hours. No results for input(s): "AMMONIA" in the last 168 hours.  ABG    Component Value Date/Time   PHART 7.4 03/31/2023 0028   PCO2ART 56 (H) 03/31/2023 0028   PO2ART 82 (L) 03/31/2023 0028   HCO3 34.7 (H) 03/31/2023 0028   O2SAT 97.9 03/31/2023  0028     Coagulation Profile: Recent Labs  Lab 04/10/23 0222  INR 1.0     Cardiac Enzymes: No results for input(s): "CKTOTAL", "CKMB", "CKMBINDEX", "TROPONINI" in the last 168 hours.  HbA1C: HB A1C (BAYER DCA - WAIVED)  Date/Time Value Ref Range Status  01/05/2020 03:31 PM 4.8 <7.0 % Final    Comment:                                          Diabetic Adult            <7.0                                       Healthy Adult        4.3 -  5.7                                                           (DCCT/NGSP) American Diabetes Association's Summary of Glycemic Recommendations for Adults with Diabetes: Hemoglobin A1c <7.0%. More stringent glycemic goals (A1c <6.0%) may further reduce complications at the cost of increased risk of hypoglycemia.   11/19/2017 01:41 PM 4.9 <7.0 % Final    Comment:                                          Diabetic Adult            <7.0                                       Healthy Adult        4.3 - 5.7                                                           (DCCT/NGSP) American Diabetes Association's Summary of Glycemic Recommendations for Adults with Diabetes: Hemoglobin A1c <7.0%. More stringent glycemic goals (A1c <6.0%) may further reduce complications at the cost of increased risk of hypoglycemia.    Hgb A1c MFr Bld  Date/Time Value Ref Range Status  04/01/2023 03:59 AM 6.2 (H) 4.8 - 5.6 % Final    Comment:    (NOTE) Pre diabetes:          5.7%-6.4%  Diabetes:              >6.4%  Glycemic control for   <7.0% adults with diabetes   07/18/2022 01:12 AM 5.1 4.8 - 5.6 % Final    Comment:    (NOTE) Pre diabetes:          5.7%-6.4%  Diabetes:              >6.4%  Glycemic control for   <7.0% adults with diabetes     CBG: Recent Labs  Lab 04/11/23 1532 04/11/23 1929 04/11/23 2327 04/12/23 0401 04/12/23 0735  GLUCAP 187* 116* 165* 134* 148*     Review of Systems:   Unable to obtain  Past Medical History:  She,   has a past medical history of Arthritis, Depression, Fibromyalgia, GERD (gastroesophageal reflux disease), H/O drug dependence (HCC), and Wears dentures.   Surgical History:   Past Surgical History:  Procedure Laterality Date   BREAST SURGERY     Silicone implants, then removal   CATARACT EXTRACTION W/PHACO Left 05/06/2019   Procedure: CATARACT EXTRACTION PHACO AND INTRAOCULAR LENS PLACEMENT (IOC) LEFT;  Surgeon: Galen Manila, MD;  Location: [facility];  Service: Ophthalmology;  Laterality: Left;   IR GASTROSTOMY TUBE MOD SED  04/04/2023   IR US LIVER BIOPSY  04/04/2023   TRACHEOSTOMY TUBE PLACEMENT N/A 04/10/2023   Procedure: TRACHEOSTOMY;  Surgeon: Linus Salmons, MD;  Location: [facility];  Service: ENT;  Laterality: N/A;   TUBAL  LIGATION       Social History:   reports that she has been smoking cigarettes. She has a 14.00 pack-year smoking history. She has never used smokeless tobacco. She reports that she does not currently use alcohol. She reports that she does not use drugs.   Family History:  Her family history includes COPD in her sister; Suicidality in her father.   Allergies Allergies  Allergen Reactions   Gabapentin Anaphylaxis     Home Medications  Prior to Admission medications   Medication Sig Start Date End Date Taking? Authorizing Provider  naloxone [facility]) nasal spray 4 mg/0.1 mL 1 spray into the nostril 1x, may repeat dose in alternate nostrils every 2-3 minutes until EMS arrives 03/29/23  Yes Johnson, Megan P, DO  promethazine (PHENERGAN) 25 MG tablet TAKE 1/2 TABLET BY MOUTH EVERY 8 HOURS AS NEEDED FOR NAUSEA AND VOMITING 07/31/22  Yes Johnson, Megan P, DO  albuterol (VENTOLIN HFA) 108 (90 Base) MCG/ACT inhaler Inhale 2 puffs into the lungs every 6 (six) hours as needed for wheezing or shortness of breath. Patient not taking: Reported on 03/29/2023 07/23/22   Kathrynn Running, MD  clonazePAM (KLONOPIN) 0.5 MG tablet Take by mouth.    [provider]  Fluticasone-Umeclidin-Vilant (TRELEGY ELLIPTA) 100-62.5-25 MCG/ACT AEPB Inhale 1 Inhalation into the lungs daily in the afternoon. Patient not taking: Reported on 03/29/2023 07/31/22   Dorcas Carrow, DO     Critical care time: 36 minutes     Raechel Chute, MD [facility] 04/12/2023 8:36 AM

## 2023-04-12 NOTE — Consult Note (Signed)
PHARMACY CONSULT NOTE  Pharmacy Consult for Electrolyte Monitoring and Replacement   Recent Labs: Potassium (mmol/L)  Date Value  04/12/2023 3.3 (L)  01/03/2014 3.6   Magnesium (mg/dL)  Date Value  16/09/9603 1.7  01/03/2014 1.9   Calcium (mg/dL)  Date Value  54/08/8118 7.9 (L)   Calcium, Total (mg/dL)  Date Value  14/78/2956 7.4 (L)   Albumin (g/dL)  Date Value  21/30/8657 2.2 (L)  07/31/2022 2.9 (L)  01/01/2014 3.2 (L)   Phosphorus (mg/dL)  Date Value  84/69/6295 2.1 (L)   Sodium (mmol/L)  Date Value  04/12/2023 136  07/31/2022 137  01/03/2014 139     Assessment: 72 y.o female w/ significant PMH of COPD, Emphysema, GERD, RA, Fibromyalgia, tobacco abuse, h/o drug abuse (cocaine). EtOH Abuse, Anxiety and Depression and recent diagnosis of chest wall mass concerning for Lymphadenopathy who presented to the ED with chief complaints of acute respiratory distress. Patient currently intubated and transferred to ICU. Pharmacy has been consulted to manage and replace electrolytes while under PCCM care.  Diet/Nutrition: Vital 1.5 at 40 mL/hr. Free water 30 ml q4H.   Goal of Therapy:  Electrolytes within normal limits  Plan:  --K 3.3, Phos 2.1: Kphos x 1, KCL x 1 via tube --Mg 1.7: Magnesium sulfate 4 g IV x 1 --Follow-up electrolytes with AM labs tomorrow  Bettey Costa 04/12/2023 8:48 AM

## 2023-04-12 NOTE — Progress Notes (Signed)
RN to room for ventilator alarm. Pt noted to have disconnected ventilator tubing from tracheostomy, O2 sats in mid to low 80s. Pt continues to writhe in bed. Vent reconnected to tracheostomy tube, 100% O2 administered. Pt denies intentional disconnection. Pt educated on rolling in the bed and tangling herself in lines. Pt acknowledges teaching but behavior continues. Pt has previously pulled out rectal tube twice between the hours of 1900 and 2000. Rectal tube reinsertion on hold until behavior resolves. Pt has also removed Purewick.

## 2023-04-12 NOTE — Progress Notes (Signed)
SLP Cancellation Note  Patient Details Name: Vanessa Oneal MRN: [id number] DOB: 03-13-1951   Cancelled treatment:       Reason Eval/Treat Not Completed: Medical issues which prohibited therapy. Discussed pt in interdisciplinary team rounds, pt not tolerating trach collar. Will defer PMV evaluation at this time.  Clyde Canterbury, M.S., CCC-SLP Speech-Language Pathologist [facility] [phone] [name])  [name] 04/12/2023, 10:34 AM

## 2023-04-12 NOTE — TOC Progression Note (Addendum)
Transition of Care [facility]) - Progression Note    Patient Details  Name: Vanessa Oneal MRN: [id number] Date of Birth: 06-30-1951  Transition [facility] CM/SW Contact  Darolyn Rua, [place] Phone Number: 04/12/2023, 2:52 PM  Clinical Narrative:     CSW spoke with patient's son [name] regarding LTAC choice of [facility] vs [facility] has he reports choice has not been offered yet.   Options of both LTAC's reviewed in detail, all questions answered. [name] reports he wishes to speak with his brother before deciding on LTAC choice, pending decision at this time.   Expected Discharge Plan: Long Term Acute Care (LTAC) Barriers to Discharge: Continued Medical Work up  Expected Discharge Plan and Services       Living arrangements for the past 2 months: Apartment                                       Social Determinants of Health (SDOH) Interventions SDOH Screenings   Food Insecurity: No Food Insecurity (03/31/2023)  Housing: Low Risk  (03/31/2023)  Transportation Needs: No Transportation Needs (03/31/2023)  Utilities: Not At Risk (03/31/2023)  Alcohol Screen: Low Risk  (07/31/2022)  Depression (PHQ2-9): Medium Risk (07/31/2022)  Financial Resource Strain: Low Risk  (07/31/2022)  Physical Activity: Insufficiently Active (07/31/2022)  Social Connections: Socially Isolated (07/31/2022)  Stress: Stress Concern Present (07/31/2022)  Tobacco Use: High Risk (04/11/2023)    Readmission Risk Interventions     No data to display

## 2023-04-13 DIAGNOSIS — J189 Pneumonia, unspecified organism: Secondary | ICD-10-CM | POA: Diagnosis not present

## 2023-04-13 DIAGNOSIS — A419 Sepsis, unspecified organism: Secondary | ICD-10-CM | POA: Diagnosis not present

## 2023-04-13 DIAGNOSIS — C3490 Malignant neoplasm of unspecified part of unspecified bronchus or lung: Secondary | ICD-10-CM

## 2023-04-13 DIAGNOSIS — J9601 Acute respiratory failure with hypoxia: Secondary | ICD-10-CM | POA: Diagnosis not present

## 2023-04-13 DIAGNOSIS — J9621 Acute and chronic respiratory failure with hypoxia: Secondary | ICD-10-CM | POA: Diagnosis not present

## 2023-04-13 LAB — BLOOD GAS, ARTERIAL
Acid-Base Excess: 6.1 mmol/L — ABNORMAL HIGH (ref 0.0–2.0)
Bicarbonate: 29.6 mmol/L — ABNORMAL HIGH (ref 20.0–28.0)
FIO2: 40 %
O2 Saturation: 97 %
PEEP: 5 cmH2O
Patient temperature: 37
Pressure support: 10 cmH2O
pCO2 arterial: 38 mmHg (ref 32–48)
pH, Arterial: 7.5 — ABNORMAL HIGH (ref 7.35–7.45)
pO2, Arterial: 79 mmHg — ABNORMAL LOW (ref 83–108)

## 2023-04-13 LAB — RENAL FUNCTION PANEL
Albumin: 2.3 g/dL — ABNORMAL LOW (ref 3.5–5.0)
Anion gap: 8 (ref 5–15)
BUN: 17 mg/dL (ref 8–23)
CO2: 27 mmol/L (ref 22–32)
Calcium: 8.4 mg/dL — ABNORMAL LOW (ref 8.9–10.3)
Chloride: 99 mmol/L (ref 98–111)
Creatinine, Ser: 0.37 mg/dL — ABNORMAL LOW (ref 0.44–1.00)
GFR, Estimated: >60 mL/min (ref >60)
Glucose, Bld: 116 mg/dL — ABNORMAL HIGH (ref 70–99)
Phosphorus: 4 mg/dL (ref 2.5–4.6)
Potassium: 4.2 mmol/L (ref 3.5–5.1)
Sodium: 134 mmol/L — ABNORMAL LOW (ref 135–145)

## 2023-04-13 LAB — CBC
HCT: 27 % — ABNORMAL LOW (ref 36.0–46.0)
Hemoglobin: 8.9 g/dL — ABNORMAL LOW (ref 12.0–15.0)
MCH: 31.1 pg (ref 26.0–34.0)
MCHC: 33 g/dL (ref 30.0–36.0)
MCV: 94.4 fL (ref 80.0–100.0)
Platelets: 297 10*3/uL (ref 150–400)
RBC: 2.86 MIL/uL — ABNORMAL LOW (ref 3.87–5.11)
RDW: 14.2 % (ref 11.5–15.5)
WBC: 12.2 10*3/uL — ABNORMAL HIGH (ref 4.0–10.5)
nRBC: 0 % (ref 0.0–0.2)

## 2023-04-13 LAB — MAGNESIUM: Magnesium: 2 mg/dL (ref 1.7–2.4)

## 2023-04-13 LAB — PROCALCITONIN
Procalcitonin: <0.1 ng/mL
Procalcitonin: 0.18 ng/mL

## 2023-04-13 LAB — GLUCOSE, CAPILLARY
Glucose-Capillary: 89 mg/dL (ref 70–99)
Glucose-Capillary: 110 mg/dL — ABNORMAL HIGH (ref 70–99)
Glucose-Capillary: 146 mg/dL — ABNORMAL HIGH (ref 70–99)
Glucose-Capillary: 131 mg/dL — ABNORMAL HIGH (ref 70–99)
Glucose-Capillary: 128 mg/dL — ABNORMAL HIGH (ref 70–99)

## 2023-04-13 MED ORDER — POLYETHYLENE GLYCOL 3350 17 G PO PACK
17.0000 g | PACK | Freq: Every day | ORAL | Status: DC
  Administered 2023-04-14 – 2023-04-16 (×2): 17 g
  Filled 2023-04-13 (×2): qty 1

## 2023-04-13 MED ORDER — SODIUM CHLORIDE 0.9 % IV SOLN
3.0000 g | Freq: Four times a day (QID) | INTRAVENOUS | Status: DC
  Administered 2023-04-13 – 2023-04-15 (×9): 3 g via INTRAVENOUS
  Filled 2023-04-13: qty 3
  Filled 2023-04-13: qty 8
  Filled 2023-04-13: qty 3
  Filled 2023-04-13: qty 8
  Filled 2023-04-13: qty 3
  Filled 2023-04-13 (×3): qty 8
  Filled 2023-04-13 (×3): qty 3
  Filled 2023-04-13: qty 8

## 2023-04-13 MED ORDER — NOREPINEPHRINE 4 MG/250ML-% IV SOLN: 0.0000 ug/min | INTRAVENOUS | Status: DC

## 2023-04-13 MED ORDER — NOREPINEPHRINE 4 MG/250ML-% IV SOLN
INTRAVENOUS | Status: AC
  Administered 2023-04-13: 2 ug/min via INTRAVENOUS
  Filled 2023-04-13: qty 250

## 2023-04-13 MED ORDER — DIAZEPAM 5 MG PO TABS
20.0000 mg | ORAL_TABLET | Freq: Three times a day (TID) | ORAL | Status: DC
  Administered 2023-04-13 – 2023-04-14 (×3): 20 mg
  Filled 2023-04-13 (×3): qty 4

## 2023-04-13 MED ORDER — QUETIAPINE FUMARATE 100 MG PO TABS
200.0000 mg | ORAL_TABLET | Freq: Three times a day (TID) | ORAL | Status: DC
  Administered 2023-04-13 – 2023-04-14 (×3): 200 mg
  Filled 2023-04-13 (×3): qty 2

## 2023-04-13 NOTE — Progress Notes (Signed)
NAME:  Vanessa Oneal, MRN:  [id number], DOB:  [birth year], LOS: 13 ADMISSION DATE:  03/31/2023, CONSULTATION DATE:  03/31/2023 REFERRING MD:  ED Provider, CHIEF COMPLAINT:  Respiratory Failure   Brief Pt Description / Synopsis:  Patient is a 72 year old female with a past medical history of COPD/emphysema presenting to the hospital with acute on chronic hypoxic and hypercapnic respiratory due to Acute COPD Exacerbation requiring intubation and mechanical ventilation. Course has been complicated by failure to wean off the ventilator secondary to extremely elevated sedation requirements.   History of Present Illness:  72 y.o female w/ significant PMH of COPD, Emphysema, GERD, RA, Fibromyalgia, tobacco abuse, h/o drug abuse (cocaine). EtOH Abuse, Anxiety and Depression and recent diagnosis of chest wall mass concerning for Lymphadenopathy who presented to the ED with chief complaints of acute respiratory distress.   On review of chart, patient recently presented to [facility] emergency room on 03/27/2023 with complaints of chest pain.  CTA chest and CT abdomen pelvis with contrast was obtained which showed extensive mediastinal soft tissue mass and a large retroperitoneal soft tissue mass favored to represent lymphoma. Patient was referred to outpatient oncology for further evaluation.  Patient was evaluated by oncologist Dr. Cathie Hoops on 03/29/2023 who recommended a PET scan and ultrasound-guided biopsy of the right supraclavicular lymphadenopathy for further evaluation.  She also had myeloma panel and peripheral blood flow cytometric and LDH obtained.  Prior to presenting to the ED, patient had been complaining of progressive shortness of breath  all day and was found with oxygen saturation of 66% on room air.  She was placed on a nonrebreather and given Solu-Medrol 125 and 2 albuterol DuoNebs prior to transporting to the ED for further evaluation.   ED Course: Initial vital signs showed HR of 77 beats/minute, BP  90/60 mm Hg, the RR 22 breaths/minute, and the oxygen saturation 78% on NRB and a temperature of 98.56F (36.8C).  He was noted to be in acute respiratory distress with increased work of breathing.  Due to concerns for impending respiratory arrest, patient was intubated for airway protection.   Pertinent Labs/Diagnostics Findings: Na+/ K+:130/4.0 Glucose:150 BUN/Cr.:20/0.50   WBC:10 Hgb/Hct: 10.0/30.5  PCT: 0.32 Lactic acid: 2.3 COVID PCR: Negative, Troponin:  BNP: 418.2  pO2 82; pCO2 56; pH 7.4;  HCO3 34.7, %O2 Sat 97.9.    Patient given 30 cc/kg of fluids and started on broad-spectrum antibiotics Vanco cefepime and Flagyl for sepsis. PCCM consulted for admission.  Please see " Significant Hospital Events" section below for full detailed hospital course.  Pertinent  Medical History   Past Medical History:  Diagnosis Date   Arthritis    RA   Depression    Fibromyalgia    GERD (gastroesophageal reflux disease)    H/O drug dependence (HCC)    opiods   Wears dentures    full upper    Micro Data:  4/27: COVID-19/RSV/Flu PCR>>negative 4/27: Respiratory viral panel>>negative 4/27: Strep pneumo urinary antigen>>negative 4/27: Legionella urinary antigen>>negative 4/27: Blood culture x2>>negative 4/27: MRSA PCR>>negative 4/27: BAL>> no growth  Antimicrobials:   Anti-infectives (From admission, onward)    Start     Dose/Rate Route Frequency Ordered Stop   04/13/23 0345  Ampicillin-Sulbactam (UNASYN) 3 g in sodium chloride 0.9 % 100 mL IVPB        3 g 200 mL/hr over 30 Minutes Intravenous Every 6 hours 04/13/23 0248     04/01/23 0500  vancomycin (VANCOREADY) IVPB 500 mg/100 mL  Status:  Discontinued  500 mg 100 mL/hr over 60 Minutes Intravenous Every 24 hours 03/31/23 0643 04/01/23 1021   03/31/23 1800  ceFEPIme (MAXIPIME) 2 g in sodium chloride 0.9 % 100 mL IVPB  Status:  Discontinued        2 g 200 mL/hr over 30 Minutes Intravenous Every 12 hours 03/31/23 0643 04/04/23  1057   03/31/23 0100  vancomycin (VANCOCIN) IVPB 1000 mg/200 mL premix        1,000 mg 200 mL/hr over 60 Minutes Intravenous  Once 03/31/23 0048 03/31/23 0220   03/31/23 0100  ceFEPIme (MAXIPIME) 2 g in sodium chloride 0.9 % 100 mL IVPB        2 g 200 mL/hr over 30 Minutes Intravenous  Once 03/31/23 0048 03/31/23 0201        Significant Hospital Events: Including procedures, antibiotic start and stop dates in addition to other pertinent events   4/27: Chest Xray>> Endotracheal tube tip about 3.5 cm superior to carina. Esophageal tube tip below the diaphragm but incompletely visualized. Diffuse bilateral interstitial and hazy lung opacity, either representing edema or diffuse lung infection. More focal nodular opacity in the right mid lung may be infectious/ inflammatory or neoplastic in etiology. 4/27: CTA Chest/Abdomen/Pelvis>>Moderate left and small right pleural effusions. Emphysema. Diffuse bilateral septal thickening suggests underlying edema. Heterogeneous bilateral airspace consolidations throughout the lungs suspicious for multifocal pneumonia. Extensive cystic/low-density right supraclavicular, mediastinal, hilar, upper abdominal and retroperitoneal adenopathy. Differential considerations include necrotic metastatic disease, infectious etiology such as tuberculous or nontuberculous mycobacterial infection, and lymphoproliferative disease. Endotracheal tube tip about 1.2 cm superior to carina. Esophageal tube tip just beyond GE junction, suggest further advancement for more optimal positioning, there is moderate fluid distension of the stomach Small volume abdominopelvic ascites. Aortic Atherosclerosis 4/28: Pt remains mechanically intubated vent settings: FiO2 50%/PEEP 5.  Pt with severe anxiety on precedex and fentanyl gtts will start antianxiety medications per tubes  4/29: severe COPD, alert, unable to wean from vent 5/1: s/p PEG and LN biopsy 5/2: remains on vent, severe end stage  COPD, failure to wean from vent 5/3: remains on vent, severe COPD, failure to wean from vent 5/4: failure to wean from vent 5/6: Pt remains mechanically intubated heavily sedated with propofol/versed/fentanyl gtts due to severe anxiety/agitation pending trach placement 05/7 5/7: Pt underwent tracheostomy placement size #6 shiley per ENT.  Will start weaning sedatives  5/9: Patient awake, follows commands, on trach collar  5/10: Overnight pt severely agitated with increased WOB.  Required Propofol for a time, sedated this morning.  CXR with worsening of bilateral airspace opacities~ gently diuresed and placed on empiric Unasyn.  Interim History / Subjective:  -Overnight pt was extremely agitated ~ removed rectal tube numerous times and disconnected herself from vent multiple times ~ With this she required initiation of Propofol for a period due to increased WOB and brief SVT -CXR showed worsening of bilateral airspace disease (R>L) -BNP elevated at 634 ~ she was given 20 mg IV Lasix, Creatinine remains normal, UOP 1L past 24 yrs (net - 380 cc) -WBC increased to 12.2 from 10 and Procalcitonin marginally elevated at 0.18 ~ was placed on empiric Unasyn ~ if any change in character of secretions will obtain culture    Objective   Blood pressure (!) 92/56, pulse 95, temperature 99.2 F (37.3 C), temperature source Oral, resp. rate (!) 38, height 5\' 1"  (1.549 m), weight 40.5 kg, SpO2 99 %.    Vent Mode: PSV FiO2 (%):  [28 %-40 %] 40 %  PEEP:  [5 cmH20] 5 cmH20 Pressure Support:  [10 cmH20] 10 cmH20 Plateau Pressure:  [21 cmH20-31 cmH20] 26 cmH20   Intake/Output Summary (Last 24 hours) at 04/13/2023 0847 Last data filed at 04/13/2023 0600 Gross per 24 hour  Intake 2562.55 ml  Output 1835 ml  Net 727.55 ml   Filed Weights   04/10/23 0500 04/11/23 0511 04/13/23 0515  Weight: 39.9 kg 40.7 kg 40.5 kg    Examination: General: Acute on chronically ill appearing cachectic female, laying in bed,  on vent via tracheostomy, with mild tachypnea but NAD HENT: Atraumatic, normocephalic, neck supple, no JVD Lungs: Coarse breath sounds bilaterally, on PS mode with mild tachypnea, even Cardiovascular: RRR, s1s2, no M/R/G Abdomen: Soft, nontender, nondistended, no guarding or rebound tenderness, BS+, PEG clean dry and intact Extremities: No deformities, no edema Neuro: Sedated, withdraws from pain but not currently following, commands, pupils PERRL GU: Foley catheter in place draining yellow urine  Resolved Hospital Problem list     Assessment & Plan:   #Acute on Chronic Hypoxic & Hypercapnic Respiratory Failure due to AECOPD Presented with respiratory failure secondary to pneumonia and COPD exacerbation. Treated with antibiotics, steroids, and nebulizer therapy. She's failed to wean off the ventilator, and previously refused extubation during goals of care discussions. She is s/p tracheostomy, and did tolerated SBT and then trach collar yesterday. Was vented overnight, and will resume trach collar today. -Full vent support, implement lung protective strategies -Plateau pressures less than 30 cm H20 -Wean FiO2 & PEEP as tolerated to maintain O2 sats 88 to 92% -Follow intermittent Chest X-ray & ABG as needed -Spontaneous Breathing Trials / Trach collar trials as tolerated -Implement VAP Bundle -Bronchodilators -ABX as above -Diuresis as BP and renal function permits ~ received 20 mg IV Lasix x1 dose on 5/10  #HFrEF (Systolic and Diastolic dysfunction) #Demand ischemia TTE from 04/01/2023 with systolic and diastolic dysfunction. Troponin elevated on presentation (peak 871), attributed to demand. Hemodynamically stable. Continue to monitor. -Continuous cardiac monitoring -Maintain MAP >65 -Cautious IV fluids -Vasopressors as needed to maintain MAP goal ~ not requirijng -BNP was 634 -Diuresis as BP and renal function permits ~ received 20 mg IV Lasix x1 dose on 5/10  #Metastatic  Malignancy #Extensive Stage Small Cell Lung Cancer (neuroendocrine) She had a lymph node biopsies secondary to concern for malignancy (with result returning positive for metastatic malignancy). Neuroendocrine/small cell lung cancer, extensive stage. Patient is not a current candidate for any chemotherapy, and oncology will hold off on evaluating her at the moment, thou she could benefit from being evaluated by them in the future.  #New Leukocytosis, ? Aspiration -Monitor fever curve -Trend WBC's & Procalcitonin -Follow cultures as above -Continue empiric Unasyn pending cultures & sensitivities  #Anxiety #Analgo-sedation She is now requiring dexmedetomidine and midazolam infusions for sedation, which we are attempting to wean off by increasing the dose of parenteral medications. -Maintain a RASS goal of 0 -Precedex and versed as needed to maintain RASS goal -Avoid sedating medications as able -Daily wake up assessment -Start scheduled Valium -Increase quetiapine (follow qtc) -continue standing oxycodone   #Gastrointestinal Continue tube feeds. H2B for SUP   #Renal Kidney function at baseline. Continue to monitor and replete electrolytes as needed   #Endocrine ICU glycemic protocol     Pt with severe COPD, Metastatic Malignancy/Extensive Stage Small Cell Lung Cancer (neuroendocrine), overall long term prognosis is extremely poor.  Recommend DNR status.   Best Practice (right click and "Reselect all SmartList Selections" daily)  Diet/type: tubefeeds and NPO DVT prophylaxis: prophylactic heparin  GI prophylaxis: H2B Lines: N/A Foley:  Yes, and it is still needed (urinary retention) Code Status:  full code Last date of multidisciplinary goals of care discussion [5/10]  5/10: Will update pt's family when they arrive at bedside.  Labs   CBC: Recent Labs  Lab 04/09/23 1117 04/10/23 0222 04/11/23 0431 04/12/23 0458 04/13/23 0445  WBC 9.5 10.0 9.2 10.0 12.2*  NEUTROABS  7.1 7.9*  --   --   --   HGB 9.7* 9.0* 9.0* 8.6* 8.9*  HCT 29.6* 27.1* 27.4* 26.5* 27.0*  MCV 95.2 94.8 95.5 94.3 94.4  PLT 286 263 268 266 297    Basic Metabolic Panel: Recent Labs  Lab 04/08/23 0524 04/09/23 0527 04/10/23 0222 04/11/23 0431 04/12/23 0458 04/13/23 0445  NA 130* 129* 127* 130* 136 134*  K 3.9 4.0 4.4 3.7 3.3* 4.2  CL 96* 91* 93* 95* 103 99  CO2 30 29 28 29 26 27   GLUCOSE 113* 133* 86 128* 171* 116*  BUN 19 22 20 15 18 17   CREATININE <0.30* 0.31* <0.30* <0.30* <0.30* 0.37*  CALCIUM 8.2* 8.4* 7.8* 7.8* 7.9* 8.4*  MG 1.7 1.8 1.7 1.7 1.7 2.0  PHOS 3.3  --  3.9 2.8 2.1* 4.0   GFR: Estimated Creatinine Clearance: 41.2 mL/min (A) (by C-G formula based on SCr of 0.37 mg/dL (L)). Recent Labs  Lab 04/10/23 0222 04/11/23 0431 04/12/23 0458 04/12/23 2219 04/13/23 0445  PROCALCITON  --   --   --  <0.10 0.18  WBC 10.0 9.2 10.0  --  12.2*    Liver Function Tests: Recent Labs  Lab 04/12/23 0458 04/13/23 0445  ALBUMIN 2.2* 2.3*   No results for input(s): "LIPASE", "AMYLASE" in the last 168 hours. No results for input(s): "AMMONIA" in the last 168 hours.  ABG    Component Value Date/Time   PHART 7.5 (H) 04/13/2023 0242   PCO2ART 38 04/13/2023 0242   PO2ART 79 (L) 04/13/2023 0242   HCO3 29.6 (H) 04/13/2023 0242   O2SAT 97 04/13/2023 0242     Coagulation Profile: Recent Labs  Lab 04/10/23 0222  INR 1.0    Cardiac Enzymes: No results for input(s): "CKTOTAL", "CKMB", "CKMBINDEX", "TROPONINI" in the last 168 hours.  HbA1C: HB A1C (BAYER DCA - WAIVED)  Date/Time Value Ref Range Status  01/05/2020 03:31 PM 4.8 <7.0 % Final    Comment:                                          Diabetic Adult            <7.0                                       Healthy Adult        4.3 - 5.7                                                           (DCCT/NGSP) American Diabetes Association's Summary of Glycemic Recommendations for Adults with Diabetes: Hemoglobin  A1c <7.0%. More stringent glycemic goals (  A1c <6.0%) may further reduce complications at the cost of increased risk of hypoglycemia.   11/19/2017 01:41 PM 4.9 <7.0 % Final    Comment:                                          Diabetic Adult            <7.0                                       Healthy Adult        4.3 - 5.7                                                           (DCCT/NGSP) American Diabetes Association's Summary of Glycemic Recommendations for Adults with Diabetes: Hemoglobin A1c <7.0%. More stringent glycemic goals (A1c <6.0%) may further reduce complications at the cost of increased risk of hypoglycemia.    Hgb A1c MFr Bld  Date/Time Value Ref Range Status  04/01/2023 03:59 AM 6.2 (H) 4.8 - 5.6 % Final    Comment:    (NOTE) Pre diabetes:          5.7%-6.4%  Diabetes:              >6.4%  Glycemic control for   <7.0% adults with diabetes   07/18/2022 01:12 AM 5.1 4.8 - 5.6 % Final    Comment:    (NOTE) Pre diabetes:          5.7%-6.4%  Diabetes:              >6.4%  Glycemic control for   <7.0% adults with diabetes     CBG: Recent Labs  Lab 04/12/23 1522 04/12/23 1932 04/12/23 2325 04/13/23 0432 04/13/23 0713  GLUCAP 151* 141* 146* 89 110*    Review of Systems:   Unable to assess due to sedation  Past Medical History:  She,  has a past medical history of Arthritis, Depression, Fibromyalgia, GERD (gastroesophageal reflux disease), H/O drug dependence (HCC), and Wears dentures.   Surgical History:   Past Surgical History:  Procedure Laterality Date   BREAST SURGERY     Silicone implants, then removal   CATARACT EXTRACTION W/PHACO Left 05/06/2019   Procedure: CATARACT EXTRACTION PHACO AND INTRAOCULAR LENS PLACEMENT (IOC) LEFT;  Surgeon: Galen Manila, MD;  Location: [facility];  Service: Ophthalmology;  Laterality: Left;   IR GASTROSTOMY TUBE MOD SED  04/04/2023   IR US LIVER BIOPSY  04/04/2023   TRACHEOSTOMY TUBE PLACEMENT N/A  04/10/2023   Procedure: TRACHEOSTOMY;  Surgeon: Linus Salmons, MD;  Location: [facility];  Service: ENT;  Laterality: N/A;   TUBAL LIGATION       Social History:   reports that she has been smoking cigarettes. She has a 14.00 pack-year smoking history. She has never used smokeless tobacco. She reports that she does not currently use alcohol. She reports that she does not use drugs.   Family History:  Her family history includes COPD in her sister; Suicidality in her father.   Allergies Allergies  Allergen Reactions   Gabapentin Anaphylaxis  Home Medications  Prior to Admission medications   Medication Sig Start Date End Date Taking? Authorizing Provider  naloxone [facility]) nasal spray 4 mg/0.1 mL 1 spray into the nostril 1x, may repeat dose in alternate nostrils every 2-3 minutes until EMS arrives 03/29/23  Yes Johnson, Megan P, DO  promethazine (PHENERGAN) 25 MG tablet TAKE 1/2 TABLET BY MOUTH EVERY 8 HOURS AS NEEDED FOR NAUSEA AND VOMITING 07/31/22  Yes Johnson, Megan P, DO  albuterol (VENTOLIN HFA) 108 (90 Base) MCG/ACT inhaler Inhale 2 puffs into the lungs every 6 (six) hours as needed for wheezing or shortness of breath. Patient not taking: Reported on 03/29/2023 07/23/22   Kathrynn Running, MD  clonazePAM (KLONOPIN) 0.5 MG tablet Take by mouth.    [provider]  Fluticasone-Umeclidin-Vilant (TRELEGY ELLIPTA) 100-62.5-25 MCG/ACT AEPB Inhale 1 Inhalation into the lungs daily in the afternoon. Patient not taking: Reported on 03/29/2023 07/31/22   Dorcas Carrow, DO     Critical care time: 40 minutes     Harlon Ditty, AGACNP-BC [facility] Prefer epic messenger for cross cover needs If after hours, please call E-link

## 2023-04-13 NOTE — Consult Note (Signed)
PHARMACY CONSULT NOTE  Pharmacy Consult for Electrolyte Monitoring and Replacement   Recent Labs: Potassium (mmol/L)  Date Value  04/13/2023 4.2  01/03/2014 3.6   Magnesium (mg/dL)  Date Value  16/09/9603 2.0  01/03/2014 1.9   Calcium (mg/dL)  Date Value  54/08/8118 8.4 (L)   Calcium, Total (mg/dL)  Date Value  14/78/2956 7.4 (L)   Albumin (g/dL)  Date Value  21/30/8657 2.3 (L)  07/31/2022 2.9 (L)  01/01/2014 3.2 (L)   Phosphorus (mg/dL)  Date Value  84/69/6295 4.0   Sodium (mmol/L)  Date Value  04/13/2023 134 (L)  07/31/2022 137  01/03/2014 139   Corrected Calcium: 8.74  Assessment: 71 y.o female w/ significant PMH of COPD, Emphysema, GERD, RA, Fibromyalgia, tobacco abuse, h/o drug abuse (cocaine). EtOH Abuse, Anxiety and Depression and recent diagnosis of chest wall mass concerning for Lymphadenopathy who presented to the ED with chief complaints of acute respiratory distress. Patient currently intubated and transferred to ICU. Pharmacy has been consulted to manage and replace electrolytes while under PCCM care.  Diet/Nutrition: Vital 1.5 at 40 mL/hr. Free water 30 ml q4H.   Goal of Therapy:  Electrolytes within normal limits  Plan:  --No replacement currently indicated --Follow-up electrolytes with AM labs tomorrow  Bettey Costa 04/13/2023 9:21 AM

## 2023-04-13 NOTE — Progress Notes (Signed)
SLP Cancellation Note  Patient Details Name: MISTEE DIMARCO MRN: [id number] DOB: Dec 27, 1950   Cancelled treatment:       Reason Eval/Treat Not Completed: Medical issues which prohibited therapy. Pt continues to require ventilator support. Will defer PMV evaluation at this time.  Clyde Canterbury, M.S., CCC-SLP Speech-Language Pathologist [facility] [phone] [name])  [name] 04/13/2023, 8:08 AM

## 2023-04-13 NOTE — Progress Notes (Signed)
Pharmacy Antibiotic Note  Vanessa Oneal is a 72 y.o. female admitted on 03/31/2023 with aspiration PNA.  Pharmacy has been consulted for Unasyn dosing.  SrCr = < 0.3   Plan: Unasyn 3 gm IV Q6H ordered to start on 5/10 @ 0315.   Height: 5\' 1"  (154.9 cm) Weight: 40.7 kg (89 lb 11.6 oz) IBW/kg (Calculated) : 47.8  Temp (24hrs), Avg:99.3 F (37.4 C), Min:98.5 F (36.9 C), Max:100.4 F (38 C)  Recent Labs  Lab 04/07/23 0410 04/08/23 0524 04/09/23 0527 04/09/23 1117 04/10/23 0222 04/11/23 0431 04/12/23 0458  WBC 5.5  --   --  9.5 10.0 9.2 10.0  CREATININE 0.36* <0.30* 0.31*  --  <0.30* <0.30* <0.30*    CrCl cannot be calculated (This lab value cannot be used to calculate CrCl because it is not a number: <0.30).    Allergies  Allergen Reactions   Gabapentin Anaphylaxis    Antimicrobials this admission:   >>    >>   Dose adjustments this admission:   Microbiology results:  BCx:   UCx:    Sputum:    MRSA PCR:   Thank you for allowing pharmacy to be a part of this patient's care.  Benjamin Merrihew D 04/13/2023 3:10 AM

## 2023-04-13 NOTE — TOC Progression Note (Signed)
Transition of Care [facility]) - Progression Note    Patient Details  Name: Vanessa Oneal MRN: [id number] Date of Birth: 13-May-1951  [facility] of [facility]) CM/SW Contact  Darolyn Rua, [place] Phone Number: 04/13/2023, 11:56 AM  Clinical Narrative:     CSW lvm with patient's son [name] to determine if he spoke with family to decide [facility], pending call back at this time.    Expected Discharge Plan: Long Term Acute Care (LTAC) Barriers to Discharge: Continued Medical Work up  Expected Discharge Plan and Services       Living arrangements for the past 2 months: Apartment                                       Social Determinants of Health (SDOH) Interventions SDOH Screenings   Food Insecurity: No Food Insecurity (03/31/2023)  Housing: Low Risk  (03/31/2023)  Transportation Needs: No Transportation Needs (03/31/2023)  Utilities: Not At Risk (03/31/2023)  Alcohol Screen: Low Risk  (07/31/2022)  Depression (PHQ2-9): Medium Risk (07/31/2022)  Financial Resource Strain: Low Risk  (07/31/2022)  Physical Activity: Insufficiently Active (07/31/2022)  Social Connections: Socially Isolated (07/31/2022)  Stress: Stress Concern Present (07/31/2022)  Tobacco Use: High Risk (04/11/2023)    Readmission Risk Interventions     No data to display

## 2023-04-14 ENCOUNTER — Inpatient Hospital Stay: Payer: 59

## 2023-04-14 DIAGNOSIS — J9621 Acute and chronic respiratory failure with hypoxia: Secondary | ICD-10-CM | POA: Diagnosis not present

## 2023-04-14 DIAGNOSIS — A419 Sepsis, unspecified organism: Secondary | ICD-10-CM | POA: Diagnosis not present

## 2023-04-14 DIAGNOSIS — J189 Pneumonia, unspecified organism: Secondary | ICD-10-CM | POA: Diagnosis not present

## 2023-04-14 DIAGNOSIS — R000 Tachycardia, unspecified: Secondary | ICD-10-CM

## 2023-04-14 DIAGNOSIS — J441 Chronic obstructive pulmonary disease with (acute) exacerbation: Secondary | ICD-10-CM | POA: Diagnosis not present

## 2023-04-14 LAB — GLUCOSE, CAPILLARY
Glucose-Capillary: 126 mg/dL — ABNORMAL HIGH (ref 70–99)
Glucose-Capillary: 108 mg/dL — ABNORMAL HIGH (ref 70–99)
Glucose-Capillary: 140 mg/dL — ABNORMAL HIGH (ref 70–99)
Glucose-Capillary: 166 mg/dL — ABNORMAL HIGH (ref 70–99)
Glucose-Capillary: 99 mg/dL (ref 70–99)
Glucose-Capillary: 109 mg/dL — ABNORMAL HIGH (ref 70–99)
Glucose-Capillary: 154 mg/dL — ABNORMAL HIGH (ref 70–99)

## 2023-04-14 LAB — PROCALCITONIN: Procalcitonin: 0.12 ng/mL

## 2023-04-14 LAB — CBC
HCT: 24.4 % — ABNORMAL LOW (ref 36.0–46.0)
Hemoglobin: 7.9 g/dL — ABNORMAL LOW (ref 12.0–15.0)
MCH: 31 pg (ref 26.0–34.0)
MCHC: 32.4 g/dL (ref 30.0–36.0)
MCV: 95.7 fL (ref 80.0–100.0)
Platelets: 246 10*3/uL (ref 150–400)
RBC: 2.55 MIL/uL — ABNORMAL LOW (ref 3.87–5.11)
RDW: 14.5 % (ref 11.5–15.5)
WBC: 8.5 10*3/uL (ref 4.0–10.5)
nRBC: 0 % (ref 0.0–0.2)

## 2023-04-14 LAB — MAGNESIUM: Magnesium: 1.6 mg/dL — ABNORMAL LOW (ref 1.7–2.4)

## 2023-04-14 LAB — RENAL FUNCTION PANEL
Albumin: 2 g/dL — ABNORMAL LOW (ref 3.5–5.0)
Anion gap: 6 (ref 5–15)
BUN: 17 mg/dL (ref 8–23)
CO2: 27 mmol/L (ref 22–32)
Calcium: 8 mg/dL — ABNORMAL LOW (ref 8.9–10.3)
Chloride: 101 mmol/L (ref 98–111)
Creatinine, Ser: 0.39 mg/dL — ABNORMAL LOW (ref 0.44–1.00)
GFR, Estimated: >60 mL/min (ref >60)
Glucose, Bld: 156 mg/dL — ABNORMAL HIGH (ref 70–99)
Phosphorus: 3.7 mg/dL (ref 2.5–4.6)
Potassium: 4.1 mmol/L (ref 3.5–5.1)
Sodium: 134 mmol/L — ABNORMAL LOW (ref 135–145)

## 2023-04-14 MED ORDER — IPRATROPIUM-ALBUTEROL 0.5-2.5 (3) MG/3ML IN SOLN
3.0000 mL | Freq: Two times a day (BID) | RESPIRATORY_TRACT | Status: DC
  Administered 2023-04-14 – 2023-04-16 (×5): 3 mL via RESPIRATORY_TRACT
  Filled 2023-04-14 (×5): qty 3

## 2023-04-14 MED ORDER — HALOPERIDOL LACTATE 5 MG/ML IJ SOLN
10.0000 mg | Freq: Four times a day (QID) | INTRAMUSCULAR | Status: DC
  Administered 2023-04-14: 10 mg via INTRAVENOUS
  Filled 2023-04-14: qty 2

## 2023-04-14 MED ORDER — MAGNESIUM SULFATE 2 GM/50ML IV SOLN
2.0000 g | Freq: Once | INTRAVENOUS | Status: AC
  Administered 2023-04-14: 2 g via INTRAVENOUS
  Filled 2023-04-14: qty 50

## 2023-04-14 MED ORDER — FENTANYL CITRATE PF 50 MCG/ML IJ SOSY
25.0000 ug | PREFILLED_SYRINGE | Freq: Once | INTRAMUSCULAR | Status: AC
  Administered 2023-04-14: 25 ug via INTRAVENOUS
  Filled 2023-04-14: qty 1

## 2023-04-14 MED ORDER — FUROSEMIDE 10 MG/ML IJ SOLN
20.0000 mg | Freq: Once | INTRAMUSCULAR | Status: AC
  Administered 2023-04-14: 20 mg via INTRAVENOUS
  Filled 2023-04-14: qty 2

## 2023-04-14 MED ORDER — HALOPERIDOL LACTATE 5 MG/ML IJ SOLN
10.0000 mg | Freq: Four times a day (QID) | INTRAMUSCULAR | Status: DC | PRN
  Administered 2023-04-15: 10 mg via INTRAVENOUS
  Filled 2023-04-14: qty 2

## 2023-04-14 MED ORDER — DIAZEPAM 5 MG PO TABS
30.0000 mg | ORAL_TABLET | Freq: Three times a day (TID) | ORAL | Status: DC
  Administered 2023-04-14 – 2023-04-20 (×19): 30 mg
  Filled 2023-04-14 (×19): qty 6

## 2023-04-14 NOTE — Progress Notes (Signed)
SLP Cancellation Note  Patient Details Name: LELU FERTITTA MRN: [id number] DOB: 1951-04-22   Cancelled treatment:       Reason Eval/Treat Not Completed: Medical issues which prohibited therapy (Pt continues to require ventilator support. Will defer PMV evaluation at this time.)  Rondel Baton, MS, CCC-SLP Speech-Language Pathologist Office: [phone] [facility]: [phone]  Arlana Lindau 04/14/2023, 8:32 AM

## 2023-04-14 NOTE — Progress Notes (Signed)
Attempted to update pts son [name] via telephone, however he did not answer the phone. Left HIPAA appropriate voicemail message instructing him to return my phone call.  Zada Girt, AGNP  Pulmonary/Critical Care Pager [phone] (please enter 7 digits) PCCM Consult Pager [phone] (please enter 7 digits)

## 2023-04-14 NOTE — Plan of Care (Signed)
Discussed with patient plan of care for the evening, pain management and anxiety medications with some teach back displayed.  Patient's room is being warmed up take a bath and has scheduled Valium, Oxycodone and Haldol as needed for tonight.    Problem: Education: Goal: Knowledge of General Education information will improve Description: Including pain rating scale, medication(s)/side effects and non-pharmacologic comfort measures Outcome: Progressing   Problem: Health Behavior/Discharge Planning: Goal: Ability to manage health-related needs will improve Outcome: Progressing   Problem: Pain Managment: Goal: General experience of comfort will improve Outcome: Progressing

## 2023-04-14 NOTE — Progress Notes (Signed)
NAME:  Vanessa Oneal, MRN:  [id number], DOB:  [birth year], LOS: 14 ADMISSION DATE:  03/31/2023, CONSULTATION DATE:  03/31/2023 REFERRING MD:  ED Provider, CHIEF COMPLAINT:  Respiratory Failure   Brief Pt Description / Synopsis:  Patient is a 72 year old female with a past medical history of COPD/emphysema presenting to the hospital with acute on chronic hypoxic and hypercapnic respiratory due to Acute COPD Exacerbation requiring intubation and mechanical ventilation. Course has been complicated by failure to wean off the ventilator secondary to extremely elevated sedation requirements.   History of Present Illness:  72 y.o female w/ significant PMH of COPD, Emphysema, GERD, RA, Fibromyalgia, tobacco abuse, h/o drug abuse (cocaine). EtOH Abuse, Anxiety and Depression and recent diagnosis of chest wall mass concerning for Lymphadenopathy who presented to the ED with chief complaints of acute respiratory distress.   On review of chart, patient recently presented to [facility] emergency room on 03/27/2023 with complaints of chest pain.  CTA chest and CT abdomen pelvis with contrast was obtained which showed extensive mediastinal soft tissue mass and a large retroperitoneal soft tissue mass favored to represent lymphoma. Patient was referred to outpatient oncology for further evaluation.  Patient was evaluated by oncologist Dr. Cathie Hoops on 03/29/2023 who recommended a PET scan and ultrasound-guided biopsy of the right supraclavicular lymphadenopathy for further evaluation.  She also had myeloma panel and peripheral blood flow cytometric and LDH obtained.  Prior to presenting to the ED, patient had been complaining of progressive shortness of breath  all day and was found with oxygen saturation of 66% on room air.  She was placed on a nonrebreather and given Solu-Medrol 125 and 2 albuterol DuoNebs prior to transporting to the ED for further evaluation.   ED Course: Initial vital signs showed HR of 77 beats/minute, BP  90/60 mm Hg, the RR 22 breaths/minute, and the oxygen saturation 78% on NRB and a temperature of 98.44F (36.8C).  He was noted to be in acute respiratory distress with increased work of breathing.  Due to concerns for impending respiratory arrest, patient was intubated for airway protection.   Pertinent Labs/Diagnostics Findings: Na+/ K+:130/4.0 Glucose:150 BUN/Cr.:20/0.50   WBC:10 Hgb/Hct: 10.0/30.5  PCT: 0.32 Lactic acid: 2.3 COVID PCR: Negative, Troponin:  BNP: 418.2  pO2 82; pCO2 56; pH 7.4;  HCO3 34.7, %O2 Sat 97.9.    Patient given 30 cc/kg of fluids and started on broad-spectrum antibiotics Vanco cefepime and Flagyl for sepsis. PCCM consulted for admission.  Please see " Significant Hospital Events" section below for full detailed hospital course.  Pertinent  Medical History   Past Medical History:  Diagnosis Date   Arthritis    RA   Depression    Fibromyalgia    GERD (gastroesophageal reflux disease)    H/O drug dependence (HCC)    opiods   Wears dentures    full upper    Micro Data:  4/27: COVID-19/RSV/Flu PCR>>negative 4/27: Respiratory viral panel>>negative 4/27: Strep pneumo urinary antigen>>negative 4/27: Legionella urinary antigen>>negative 4/27: Blood culture x2>>negative 4/27: MRSA PCR>>negative 4/27: BAL>> no growth  Antimicrobials:   Anti-infectives (From admission, onward)    Start     Dose/Rate Route Frequency Ordered Stop   04/13/23 0345  Ampicillin-Sulbactam (UNASYN) 3 g in sodium chloride 0.9 % 100 mL IVPB        3 g 200 mL/hr over 30 Minutes Intravenous Every 6 hours 04/13/23 0248     04/01/23 0500  vancomycin (VANCOREADY) IVPB 500 mg/100 mL  Status:  Discontinued  500 mg 100 mL/hr over 60 Minutes Intravenous Every 24 hours 03/31/23 0643 04/01/23 1021   03/31/23 1800  ceFEPIme (MAXIPIME) 2 g in sodium chloride 0.9 % 100 mL IVPB  Status:  Discontinued        2 g 200 mL/hr over 30 Minutes Intravenous Every 12 hours 03/31/23 0643 04/04/23  1057   03/31/23 0100  vancomycin (VANCOCIN) IVPB 1000 mg/200 mL premix        1,000 mg 200 mL/hr over 60 Minutes Intravenous  Once 03/31/23 0048 03/31/23 0220   03/31/23 0100  ceFEPIme (MAXIPIME) 2 g in sodium chloride 0.9 % 100 mL IVPB        2 g 200 mL/hr over 30 Minutes Intravenous  Once 03/31/23 0048 03/31/23 0201        Significant Hospital Events: Including procedures, antibiotic start and stop dates in addition to other pertinent events   4/27: Chest Xray>> Endotracheal tube tip about 3.5 cm superior to carina. Esophageal tube tip below the diaphragm but incompletely visualized. Diffuse bilateral interstitial and hazy lung opacity, either representing edema or diffuse lung infection. More focal nodular opacity in the right mid lung may be infectious/ inflammatory or neoplastic in etiology. 4/27: CTA Chest/Abdomen/Pelvis>>Moderate left and small right pleural effusions. Emphysema. Diffuse bilateral septal thickening suggests underlying edema. Heterogeneous bilateral airspace consolidations throughout the lungs suspicious for multifocal pneumonia. Extensive cystic/low-density right supraclavicular, mediastinal, hilar, upper abdominal and retroperitoneal adenopathy. Differential considerations include necrotic metastatic disease, infectious etiology such as tuberculous or nontuberculous mycobacterial infection, and lymphoproliferative disease. Endotracheal tube tip about 1.2 cm superior to carina. Esophageal tube tip just beyond GE junction, suggest further advancement for more optimal positioning, there is moderate fluid distension of the stomach Small volume abdominopelvic ascites. Aortic Atherosclerosis 4/28: Pt remains mechanically intubated vent settings: FiO2 50%/PEEP 5.  Pt with severe anxiety on precedex and fentanyl gtts will start antianxiety medications per tubes  4/29: severe COPD, alert, unable to wean from vent 5/1: s/p PEG and LN biopsy 5/2: remains on vent, severe end stage  COPD, failure to wean from vent 5/3: remains on vent, severe COPD, failure to wean from vent 5/4: failure to wean from vent 5/6: Pt remains mechanically intubated heavily sedated with propofol/versed/fentanyl gtts due to severe anxiety/agitation pending trach placement 05/7 5/7: Pt underwent tracheostomy placement size #6 shiley per ENT.  Will start weaning sedatives  5/9: Patient awake, follows commands, on trach collar  5/10: Overnight pt severely agitated with increased WOB.  Required Propofol for a time, sedated this morning.  CXR with worsening of bilateral airspace opacities~ gently diuresed and placed on empiric Unasyn.  Interim History / Subjective:  Pt remains mechanically intubated FiO2 40%/PEEP 5 sedated with versed gtt @2  mg/hr and precedex gtt @1 .2 mcg/kg/hr due to severe agitation attempting to wean off.  Pt abdomen distended today and nods head yes when asked if she is having pain to palpation   Objective   Blood pressure (!) 87/61, pulse 90, temperature 98.7 F (37.1 C), temperature source Oral, resp. rate (!) 21, height 5\' 1"  (1.549 m), weight 42.9 kg, SpO2 99 %.    Vent Mode: PSV FiO2 (%):  [40 %] 40 % PEEP:  [5 cmH20] 5 cmH20 Pressure Support:  [10 cmH20] 10 cmH20 Plateau Pressure:  [26 cmH20] 26 cmH20   Intake/Output Summary (Last 24 hours) at 04/14/2023 0729 Last data filed at 04/14/2023 0600 Gross per 24 hour  Intake 2119.17 ml  Output 1900 ml  Net 219.17 ml  Filed Weights   04/11/23 0511 04/13/23 0515 04/14/23 0500  Weight: 40.7 kg 40.5 kg 42.9 kg    Examination: General: Acute on chronically ill appearing cachectic female, NAD mechanically ventilated via tracheostomy  HENT: Atraumatic, normocephalic, neck supple, no JVD, size #6 shiley tracheostomy midline clean/intact  Lungs: Diffuse rhonchi throughout, even, non labored  Cardiovascular: RRR, s1s2, no M/R/G, 2+ radial/1+ distal pulses, no edema  Abdomen: +BS x4, distended, taut, tenderness present, PEG  clean dry and intact Extremities: No deformities, no edema Neuro: Sedated, withdraws from pain but not currently following, commands, pupils PERRL GU: Foley catheter in place draining yellow urine  Resolved Hospital Problem list     Assessment & Plan:   #Acute on Chronic Hypoxic & Hypercapnic Respiratory Failure due to AECOPD Presented with respiratory failure secondary to pneumonia and COPD exacerbation. Treated with antibiotics, steroids, and nebulizer therapy. She's failed to wean off the ventilator, and previously refused extubation during goals of care discussions. She is s/p tracheostomy,  - Full vent support, implement lung protective strategies - Plateau pressures less than 30 cm H20 - Wean FiO2 & PEEP as tolerated to maintain O2 sats 88 to 92% - Follow intermittent Chest X-ray & ABG as needed - Spontaneous Breathing Trials / Trach collar trials as tolerated - Implement VAP Bundle - Bronchodilators - ABX as above - Diuresis as BP and renal function permits   #HFrEF (Systolic and Diastolic dysfunction) #Demand ischemia TTE from 04/01/2023 with systolic and diastolic dysfunction. Troponin elevated on presentation (peak 871), attributed to demand. Hemodynamically stable. Continue to monitor. - Continuous cardiac monitoring - Maintain MAP >65 - Vasopressors as needed to maintain MAP goal ~ not requirijng - BNP was 634 - Diuresis as BP and renal function permits   #Hypomagnesia  - Trend BMP  - Replace electrolytes as indicated  - Monitor UOP  #Metastatic Malignancy #Extensive Stage Small Cell Lung Cancer (neuroendocrine) She had a lymph node biopsies secondary to concern for malignancy (with result returning positive for metastatic malignancy). Neuroendocrine/small cell lung cancer, extensive stage. Patient is not a current candidate for any chemotherapy, and oncology will hold off on evaluating her at the moment, thou she could benefit from being evaluated by them in the  future.  #New leukocytosis concerning for possible aspiration pneumonia~improving   - Trend WBC and monitor fever curve  -Continue empiric Unasyn pending cultures & sensitivities  #Anxiety #Analgo-sedation She is now requiring dexmedetomidine and midazolam infusions for sedation, which we are attempting to wean off by increasing the dose of parenteral medications. -Maintain a RASS goal of 0 - Precedex gtt will attempt to wean versed gtt off first  - Continue scheduled valium, oxycodone, and seroquel  - Follow Qtc    #Gastrointestinal - Continue tube feeds. H2B for SUP - KUB pending due to mild abdominal distension    #Renal Kidney function at baseline. Continue to monitor and replete electrolytes as needed   #Endocrine - Continue SSI  - CBG's q4hrs      Pt with severe COPD, Metastatic Malignancy/Extensive Stage Small Cell Lung Cancer (neuroendocrine), overall long term prognosis is extremely poor.  Recommend DNR status.   Best Practice (right click and "Reselect all SmartList Selections" daily)   Diet/type: tubefeeds and NPO DVT prophylaxis: prophylactic heparin  GI prophylaxis: H2B Lines: Right upper extremity PICC line and still needed  Foley:  Yes, and it is still needed (urinary retention) Code Status:  full code Last date of multidisciplinary goals of care discussion [04/14/2023]  5/11:  Will update pt's family when they arrive at bedside. Labs   CBC: Recent Labs  Lab 04/09/23 1117 04/10/23 0222 04/11/23 0431 04/12/23 0458 04/13/23 0445 04/14/23 0356  WBC 9.5 10.0 9.2 10.0 12.2* 8.5  NEUTROABS 7.1 7.9*  --   --   --   --   HGB 9.7* 9.0* 9.0* 8.6* 8.9* 7.9*  HCT 29.6* 27.1* 27.4* 26.5* 27.0* 24.4*  MCV 95.2 94.8 95.5 94.3 94.4 95.7  PLT 286 263 268 266 297 246    Basic Metabolic Panel: Recent Labs  Lab 04/10/23 0222 04/11/23 0431 04/12/23 0458 04/13/23 0445 04/14/23 0356  NA 127* 130* 136 134* 134*  K 4.4 3.7 3.3* 4.2 4.1  CL 93* 95* 103 99 101   CO2 28 29 26 27 27   GLUCOSE 86 128* 171* 116* 156*  BUN 20 15 18 17 17   CREATININE <0.30* <0.30* <0.30* 0.37* 0.39*  CALCIUM 7.8* 7.8* 7.9* 8.4* 8.0*  MG 1.7 1.7 1.7 2.0 1.6*  PHOS 3.9 2.8 2.1* 4.0 3.7   GFR: Estimated Creatinine Clearance: 43.7 mL/min (A) (by C-G formula based on SCr of 0.39 mg/dL (L)). Recent Labs  Lab 04/11/23 0431 04/12/23 0458 04/12/23 2219 04/13/23 0445 04/14/23 0356  PROCALCITON  --   --  <0.10 0.18 0.12  WBC 9.2 10.0  --  12.2* 8.5    Liver Function Tests: Recent Labs  Lab 04/12/23 0458 04/13/23 0445 04/14/23 0356  ALBUMIN 2.2* 2.3* 2.0*   No results for input(s): "LIPASE", "AMYLASE" in the last 168 hours. No results for input(s): "AMMONIA" in the last 168 hours.  ABG    Component Value Date/Time   PHART 7.5 (H) 04/13/2023 0242   PCO2ART 38 04/13/2023 0242   PO2ART 79 (L) 04/13/2023 0242   HCO3 29.6 (H) 04/13/2023 0242   O2SAT 97 04/13/2023 0242     Coagulation Profile: Recent Labs  Lab 04/10/23 0222  INR 1.0    Cardiac Enzymes: No results for input(s): "CKTOTAL", "CKMB", "CKMBINDEX", "TROPONINI" in the last 168 hours.  HbA1C: HB A1C (BAYER DCA - WAIVED)  Date/Time Value Ref Range Status  01/05/2020 03:31 PM 4.8 <7.0 % Final    Comment:                                          Diabetic Adult            <7.0                                       Healthy Adult        4.3 - 5.7                                                           (DCCT/NGSP) American Diabetes Association's Summary of Glycemic Recommendations for Adults with Diabetes: Hemoglobin A1c <7.0%. More stringent glycemic goals (A1c <6.0%) may further reduce complications at the cost of increased risk of hypoglycemia.   11/19/2017 01:41 PM 4.9 <7.0 % Final    Comment:  Diabetic Adult            <7.0                                       Healthy Adult        4.3 - 5.7                                                            (DCCT/NGSP) American Diabetes Association's Summary of Glycemic Recommendations for Adults with Diabetes: Hemoglobin A1c <7.0%. More stringent glycemic goals (A1c <6.0%) may further reduce complications at the cost of increased risk of hypoglycemia.    Hgb A1c MFr Bld  Date/Time Value Ref Range Status  04/01/2023 03:59 AM 6.2 (H) 4.8 - 5.6 % Final    Comment:    (NOTE) Pre diabetes:          5.7%-6.4%  Diabetes:              >6.4%  Glycemic control for   <7.0% adults with diabetes   07/18/2022 01:12 AM 5.1 4.8 - 5.6 % Final    Comment:    (NOTE) Pre diabetes:          5.7%-6.4%  Diabetes:              >6.4%  Glycemic control for   <7.0% adults with diabetes     CBG: Recent Labs  Lab 04/13/23 0713 04/13/23 1119 04/13/23 1528 04/13/23 1940 04/14/23 0312  GLUCAP 110* 146* 131* 128* 126*    Review of Systems:   Unable to assess due to sedation  Past Medical History:  She,  has a past medical history of Arthritis, Depression, Fibromyalgia, GERD (gastroesophageal reflux disease), H/O drug dependence (HCC), and Wears dentures.   Surgical History:   Past Surgical History:  Procedure Laterality Date   BREAST SURGERY     Silicone implants, then removal   CATARACT EXTRACTION W/PHACO Left 05/06/2019   Procedure: CATARACT EXTRACTION PHACO AND INTRAOCULAR LENS PLACEMENT (IOC) LEFT;  Surgeon: Galen Manila, MD;  Location: [facility];  Service: Ophthalmology;  Laterality: Left;   IR GASTROSTOMY TUBE MOD SED  04/04/2023   IR US LIVER BIOPSY  04/04/2023   TRACHEOSTOMY TUBE PLACEMENT N/A 04/10/2023   Procedure: TRACHEOSTOMY;  Surgeon: Linus Salmons, MD;  Location: [facility];  Service: ENT;  Laterality: N/A;   TUBAL LIGATION       Social History:   reports that she has been smoking cigarettes. She has a 14.00 pack-year smoking history. She has never used smokeless tobacco. She reports that she does not currently use alcohol. She reports that she does not use drugs.    Family History:  Her family history includes COPD in her sister; Suicidality in her father.   Allergies Allergies  Allergen Reactions   Gabapentin Anaphylaxis     Home Medications  Prior to Admission medications   Medication Sig Start Date End Date Taking? Authorizing Provider  naloxone [facility]) nasal spray 4 mg/0.1 mL 1 spray into the nostril 1x, may repeat dose in alternate nostrils every 2-3 minutes until EMS arrives 03/29/23  Yes Johnson, Megan P, DO  promethazine (PHENERGAN) 25 MG tablet TAKE 1/2 TABLET BY MOUTH EVERY  8 HOURS AS NEEDED FOR NAUSEA AND VOMITING 07/31/22  Yes Johnson, Megan P, DO  albuterol (VENTOLIN HFA) 108 (90 Base) MCG/ACT inhaler Inhale 2 puffs into the lungs every 6 (six) hours as needed for wheezing or shortness of breath. Patient not taking: Reported on 03/29/2023 07/23/22   Kathrynn Running, MD  clonazePAM (KLONOPIN) 0.5 MG tablet Take by mouth.    [provider]  Fluticasone-Umeclidin-Vilant (TRELEGY ELLIPTA) 100-62.5-25 MCG/ACT AEPB Inhale 1 Inhalation into the lungs daily in the afternoon. Patient not taking: Reported on 03/29/2023 07/31/22   Dorcas Carrow, DO     Critical care time: 63 minutes     Zada Girt, [facility]  Pulmonary/Critical Care Pager [phone] (please enter 7 digits) PCCM Consult Pager [phone] (please enter 7 digits)

## 2023-04-14 NOTE — Consult Note (Signed)
PHARMACY CONSULT NOTE  Pharmacy Consult for Electrolyte Monitoring and Replacement   Recent Labs: Potassium (mmol/L)  Date Value  04/14/2023 4.1  01/03/2014 3.6   Magnesium (mg/dL)  Date Value  65/78/4696 1.6 (L)  01/03/2014 1.9   Calcium (mg/dL)  Date Value  29/52/8413 8.0 (L)   Calcium, Total (mg/dL)  Date Value  24/40/1027 7.4 (L)   Albumin (g/dL)  Date Value  25/36/6440 2.0 (L)  07/31/2022 2.9 (L)  01/01/2014 3.2 (L)   Phosphorus (mg/dL)  Date Value  34/74/2595 3.7   Sodium (mmol/L)  Date Value  04/14/2023 134 (L)  07/31/2022 137  01/03/2014 139   Corrected Calcium: 8.74  Assessment: 72 y.o female w/ significant PMH of COPD, Emphysema, GERD, RA, Fibromyalgia, tobacco abuse, h/o drug abuse (cocaine). EtOH Abuse, Anxiety and Depression and recent diagnosis of chest wall mass concerning for Lymphadenopathy who presented to the ED with chief complaints of acute respiratory distress. Patient currently intubated and transferred to ICU. Pharmacy has been consulted to manage and replace electrolytes while under PCCM care.  Diet/Nutrition: Vital 1.5 at 40 mL/hr. Free water 30 ml q4H.   Goal of Therapy:  Electrolytes within normal limits  Plan:  --2 grams IV magnesium sullfate x 1 --Follow-up electrolytes with AM labs tomorrow  Lowella Bandy 04/14/2023 7:21 AM

## 2023-04-15 DIAGNOSIS — J441 Chronic obstructive pulmonary disease with (acute) exacerbation: Secondary | ICD-10-CM | POA: Diagnosis not present

## 2023-04-15 DIAGNOSIS — Z7189 Other specified counseling: Secondary | ICD-10-CM | POA: Diagnosis not present

## 2023-04-15 DIAGNOSIS — J9621 Acute and chronic respiratory failure with hypoxia: Secondary | ICD-10-CM | POA: Diagnosis not present

## 2023-04-15 DIAGNOSIS — J9601 Acute respiratory failure with hypoxia: Secondary | ICD-10-CM | POA: Diagnosis not present

## 2023-04-15 DIAGNOSIS — R000 Tachycardia, unspecified: Secondary | ICD-10-CM

## 2023-04-15 DIAGNOSIS — J189 Pneumonia, unspecified organism: Secondary | ICD-10-CM | POA: Diagnosis not present

## 2023-04-15 DIAGNOSIS — Z515 Encounter for palliative care: Secondary | ICD-10-CM

## 2023-04-15 LAB — RENAL FUNCTION PANEL
Albumin: 2 g/dL — ABNORMAL LOW (ref 3.5–5.0)
Anion gap: 16 — ABNORMAL HIGH (ref 5–15)
BUN: 17 mg/dL (ref 8–23)
CO2: 25 mmol/L (ref 22–32)
Calcium: 7.2 mg/dL — ABNORMAL LOW (ref 8.9–10.3)
Chloride: 101 mmol/L (ref 98–111)
Creatinine, Ser: 0.58 mg/dL (ref 0.44–1.00)
GFR, Estimated: >60 mL/min (ref >60)
Glucose, Bld: 185 mg/dL — ABNORMAL HIGH (ref 70–99)
Phosphorus: 4.2 mg/dL (ref 2.5–4.6)
Potassium: 3.2 mmol/L — ABNORMAL LOW (ref 3.5–5.1)
Sodium: 140 mmol/L (ref 135–145)

## 2023-04-15 LAB — CBC
HCT: 22.8 % — ABNORMAL LOW (ref 36.0–46.0)
Hemoglobin: 7.4 g/dL — ABNORMAL LOW (ref 12.0–15.0)
MCH: 31 pg (ref 26.0–34.0)
MCHC: 32.5 g/dL (ref 30.0–36.0)
MCV: 95.4 fL (ref 80.0–100.0)
Platelets: 219 10*3/uL (ref 150–400)
RBC: 2.39 MIL/uL — ABNORMAL LOW (ref 3.87–5.11)
RDW: 14 % (ref 11.5–15.5)
WBC: 6.5 10*3/uL (ref 4.0–10.5)
nRBC: 0 % (ref 0.0–0.2)

## 2023-04-15 LAB — GLUCOSE, CAPILLARY
Glucose-Capillary: 175 mg/dL — ABNORMAL HIGH (ref 70–99)
Glucose-Capillary: 133 mg/dL — ABNORMAL HIGH (ref 70–99)
Glucose-Capillary: 89 mg/dL (ref 70–99)
Glucose-Capillary: 120 mg/dL — ABNORMAL HIGH (ref 70–99)
Glucose-Capillary: 131 mg/dL — ABNORMAL HIGH (ref 70–99)
Glucose-Capillary: 150 mg/dL — ABNORMAL HIGH (ref 70–99)

## 2023-04-15 LAB — MAGNESIUM: Magnesium: 1.6 mg/dL — ABNORMAL LOW (ref 1.7–2.4)

## 2023-04-15 MED ORDER — MAGNESIUM SULFATE 2 GM/50ML IV SOLN
2.0000 g | Freq: Once | INTRAVENOUS | Status: AC
  Administered 2023-04-15: 2 g via INTRAVENOUS
  Filled 2023-04-15: qty 50

## 2023-04-15 MED ORDER — HALOPERIDOL LACTATE 5 MG/ML IJ SOLN
5.0000 mg | Freq: Four times a day (QID) | INTRAMUSCULAR | Status: DC | PRN
  Administered 2023-04-15 – 2023-04-17 (×3): 5 mg via INTRAVENOUS
  Filled 2023-04-15 (×3): qty 1

## 2023-04-15 MED ORDER — FUROSEMIDE 10 MG/ML IJ SOLN
20.0000 mg | Freq: Once | INTRAMUSCULAR | Status: AC
  Administered 2023-04-15: 20 mg via INTRAVENOUS
  Filled 2023-04-15: qty 2

## 2023-04-15 MED ORDER — HALOPERIDOL LACTATE 5 MG/ML IJ SOLN
10.0000 mg | Freq: Four times a day (QID) | INTRAMUSCULAR | Status: DC
  Administered 2023-04-15: 10 mg via INTRAVENOUS
  Filled 2023-04-15: qty 2

## 2023-04-15 MED ORDER — HALOPERIDOL LACTATE 5 MG/ML IJ SOLN
10.0000 mg | Freq: Two times a day (BID) | INTRAMUSCULAR | Status: DC
  Administered 2023-04-15: 10 mg via INTRAVENOUS

## 2023-04-15 MED ORDER — SODIUM CHLORIDE 0.9 % IV SOLN
3.0000 g | Freq: Four times a day (QID) | INTRAVENOUS | Status: AC
  Administered 2023-04-15 – 2023-04-17 (×11): 3 g via INTRAVENOUS
  Filled 2023-04-15: qty 3
  Filled 2023-04-15 (×2): qty 8
  Filled 2023-04-15: qty 3
  Filled 2023-04-15 (×2): qty 8
  Filled 2023-04-15 (×3): qty 3
  Filled 2023-04-15: qty 8
  Filled 2023-04-15: qty 3

## 2023-04-15 MED ORDER — MIDAZOLAM-SODIUM CHLORIDE 100-0.9 MG/100ML-% IV SOLN
0.5000 mg/h | INTRAVENOUS | Status: DC
  Administered 2023-04-15: 0.5 mg/h via INTRAVENOUS
  Administered 2023-04-16: 11 mg/h via INTRAVENOUS
  Administered 2023-04-16: 6 mg/h via INTRAVENOUS
  Administered 2023-04-16: 5 mg/h via INTRAVENOUS
  Administered 2023-04-17: 10 mg/h via INTRAVENOUS
  Administered 2023-04-17: 8 mg/h via INTRAVENOUS
  Administered 2023-04-18: 10 mg/h via INTRAVENOUS
  Filled 2023-04-15 (×6): qty 100

## 2023-04-15 MED ORDER — HALOPERIDOL LACTATE 5 MG/ML IJ SOLN
10.0000 mg | Freq: Four times a day (QID) | INTRAMUSCULAR | Status: DC | PRN
  Administered 2023-04-15: 10 mg via INTRAVENOUS
  Filled 2023-04-15 (×3): qty 2

## 2023-04-15 MED ORDER — POTASSIUM CHLORIDE 10 MEQ/50ML IV SOLN
10.0000 meq | INTRAVENOUS | Status: AC
  Administered 2023-04-15 (×3): 10 meq via INTRAVENOUS
  Filled 2023-04-15 (×3): qty 50

## 2023-04-15 MED ORDER — DIAZEPAM 5 MG/ML IJ SOLN
20.0000 mg | Freq: Once | INTRAMUSCULAR | Status: AC
  Administered 2023-04-15: 20 mg via INTRAVENOUS
  Filled 2023-04-15: qty 4

## 2023-04-15 MED ORDER — DIAZEPAM 5 MG/ML IJ SOLN
10.0000 mg | INTRAMUSCULAR | Status: AC
  Administered 2023-04-15: 10 mg via INTRAVENOUS
  Filled 2023-04-15: qty 2

## 2023-04-15 NOTE — IPAL (Signed)
  Interdisciplinary Goals of Care Family Meeting   Date carried out: 04/15/2023  Location of the meeting: Conference room  Member's involved: Physician, Bedside Registered Nurse, Family Member or next of kin, and Palliative care team member  Durable Power of Attorney or acting medical decision maker: Pts son [name] and pts niece [name]     Discussion: We discussed goals of care for Vanessa Oneal . Discussed pt has end stage COPD and newly diagnosed neuroendocrine/small cell lung cancer, extensive staging.  Informed pts family she is not a current candidate for any chemotherapy at this time per oncology.  Pt severely deconditioned and cachetic due to advance lung disease.  Informed them both we are unable to liberate pt from mechanical ventilation, and pt will require LTACH placement  given disease burden.  Pt displaying severe anxiety despite high does of antianxiety medications and signs of suffering.  Recommended considering transitioning pt to Comfort Measures Only     Code status:   Code Status: Full Code   Disposition: Continue current acute care.  [name] and [name] stated they will arrive at pts bedside tomorrow 04/16/23 to continue goals of care discussions.  They both stated they do not want the pt to suffer and she would not want to go to a facility.   Time spent for the meeting: 30 minutes     Zada Girt, [facility]  Pulmonary/Critical Care Pager [phone] (please enter 7 digits) PCCM Consult Pager [phone] (please enter 7 digits)

## 2023-04-15 NOTE — Plan of Care (Signed)
Discussed with patient plan of care for the evening, pain management and anxiety medications with some teach back displayed.  Patient is supposed to be comfort care tomorrow.    Problem: Education: Goal: Knowledge of General Education information will improve Description: Including pain rating scale, medication(s)/side effects and non-pharmacologic comfort measures Outcome: Progressing   Problem: Health Behavior/Discharge Planning: Goal: Ability to manage health-related needs will improve Outcome: Not Progressing

## 2023-04-15 NOTE — Consult Note (Signed)
PHARMACY CONSULT NOTE  Pharmacy Consult for Electrolyte Monitoring and Replacement   Recent Labs: Potassium (mmol/L)  Date Value  04/15/2023 3.2 (L)  01/03/2014 3.6   Magnesium (mg/dL)  Date Value  40/98/1191 1.6 (L)  01/03/2014 1.9   Calcium (mg/dL)  Date Value  47/82/9562 7.2 (L)   Calcium, Total (mg/dL)  Date Value  13/07/6577 7.4 (L)   Albumin (g/dL)  Date Value  46/96/2952 2.0 (L)  07/31/2022 2.9 (L)  01/01/2014 3.2 (L)   Phosphorus (mg/dL)  Date Value  84/13/2440 4.2   Sodium (mmol/L)  Date Value  04/15/2023 140  07/31/2022 137  01/03/2014 139   Corrected Calcium: 8.74 mg/dL  Assessment: 72 y.o female w/ significant PMH of COPD, Emphysema, GERD, RA, Fibromyalgia, tobacco abuse, h/o drug abuse (cocaine). EtOH Abuse, Anxiety and Depression and recent diagnosis of chest wall mass concerning for Lymphadenopathy who presented to the ED with chief complaints of acute respiratory distress. Patient currently intubated and transferred to ICU. Pharmacy has been consulted to manage and replace electrolytes while under PCCM care.  Diet/Nutrition: Vital 1.5 at 40 mL/hr. Free water 30 ml q4H.   Goal of Therapy:  Electrolytes within normal limits  Plan:  --2 grams IV magnesium sullfate x 1 --10 meQ IV KCl x 3 --Follow-up electrolytes with AM labs tomorrow  Lowella Bandy 04/15/2023 7:31 AM

## 2023-04-15 NOTE — IPAL (Signed)
  Interdisciplinary Goals of Care Family Meeting   Date carried out: 04/15/2023  Location of the meeting: Conference room  Member's involved: Physician, Nurse Practitioner, and Family Member or next of kin  Durable Power of Attorney or acting medical decision maker: Pts son Lucio Edward    Discussion: We discussed goals of care for Vanessa Oneal .  Discussed pt has end stage COPD and newly diagnosed neuroendocrine/small cell lung cancer, extensive staging.  Informed [name] pt is not a current candidate for any chemotherapy at this time per oncology.  Pt severely deconditioned and cachetic due to advance lung disease.  Informed pts son we are unable to liberate pt from mechanical ventilation, and pt will require LTACH placement  given disease burden.  Pt displaying severe anxiety despite high does of antianxiety medications and signs of suffering.  Recommended considering transitioning pt to Comfort Measures Only   Code status:   Code Status: Full Code   Disposition: Continue current acute care for now [name] would like to discuss this with pts son and his brother [name] prior to making a decision.  However, he states he does not want pt to suffer and will do what's best for her   Time spent for the meeting: 30 minutes     Zada Girt, AGNP  Pulmonary/Critical Care Pager [phone] (please enter 7 digits) PCCM Consult Pager [phone] (please enter 7 digits)

## 2023-04-15 NOTE — Progress Notes (Signed)
SLP Cancellation Note  Patient Details Name: Vanessa Oneal MRN: [id number] DOB: 1951-02-05   Cancelled treatment:       Reason Eval/Treat Not Completed: Medical issues which prohibited therapy. Pt continues to require ventilator support. PMSV evaluation deferred at this time.  Clyde Canterbury, M.S., CCC-SLP Speech-Language Pathologist [facility] [phone] (ASCOM)  [name] 04/15/2023, 8:15 AM

## 2023-04-15 NOTE — Progress Notes (Signed)
Patient is awake on her maximum sedation of Precedex - wake-up assessment not necessary

## 2023-04-15 NOTE — IPAL (Signed)
  Interdisciplinary Goals of Care Family Meeting   Date carried out: 04/15/2023  Location of the meeting: Bedside  Member's involved: Bedside Registered Nurse and Family Member or next of kin  Durable Power of Attorney or acting medical decision maker: Pts son Vanessa Oneal and Vanessa Oneal   Discussion: We discussed goals of care for Vanessa Oneal .  Discussed code status with  pts son Vanessa Oneal and he has requested  to change pts code status to DO NOT RESUSCITATE.  He states immediate family will arrive at bedside tomorrow 04/16/2023 at 1500 to say their goodbyes and transition pt to Comfort Measures Only. Also, spoke with pts son Vanessa Oneal via telephone to inform him his brother would like to change pts code status to DNR, but we will continue current acute care until they are ready to transition pt to Comfort Measures Only.  [name] agreed to this.  Will change code status to DNR per family request.   Code status: DNR   Disposition: Continue current acute care  Time spent for the meeting: 10 minutes     Zada Girt, AGNP  Pulmonary/Critical Care Pager [phone] (please enter 7 digits) PCCM Consult Pager [phone] (please enter 7 digits)

## 2023-04-15 NOTE — Progress Notes (Signed)
NAME:  Vanessa Oneal, MRN:  [id number], DOB:  19-Jan-1951, LOS: 15 ADMISSION DATE:  03/31/2023, CONSULTATION DATE:  03/31/2023 REFERRING MD:  ED Provider, CHIEF COMPLAINT:  Respiratory Failure   Brief Pt Description / Synopsis:  Patient is a 72 year old female with a past medical history of COPD/emphysema presenting to the hospital with acute on chronic hypoxic and hypercapnic respiratory due to Acute COPD Exacerbation requiring intubation and mechanical ventilation. Course has been complicated by failure to wean off the ventilator secondary to extremely elevated sedation requirements.   History of Present Illness:  72 y.o female w/ significant PMH of COPD, Emphysema, GERD, RA, Fibromyalgia, tobacco abuse, h/o drug abuse (cocaine). EtOH Abuse, Anxiety and Depression and recent diagnosis of chest wall mass concerning for Lymphadenopathy who presented to the ED with chief complaints of acute respiratory distress.   On review of chart, patient recently presented to [facility] emergency room on 03/27/2023 with complaints of chest pain.  CTA chest and CT abdomen pelvis with contrast was obtained which showed extensive mediastinal soft tissue mass and a large retroperitoneal soft tissue mass favored to represent lymphoma. Patient was referred to outpatient oncology for further evaluation.  Patient was evaluated by oncologist Dr. Cathie Hoops on 03/29/2023 who recommended a PET scan and ultrasound-guided biopsy of the right supraclavicular lymphadenopathy for further evaluation.  She also had myeloma panel and peripheral blood flow cytometric and LDH obtained.  Prior to presenting to the ED, patient had been complaining of progressive shortness of breath  all day and was found with oxygen saturation of 66% on room air.  She was placed on a nonrebreather and given Solu-Medrol 125 and 2 albuterol DuoNebs prior to transporting to the ED for further evaluation.   ED Course: Initial vital signs showed HR of 77 beats/minute, BP  90/60 mm Hg, the RR 22 breaths/minute, and the oxygen saturation 78% on NRB and a temperature of 98.20F (36.8C).  He was noted to be in acute respiratory distress with increased work of breathing.  Due to concerns for impending respiratory arrest, patient was intubated for airway protection.   Pertinent Labs/Diagnostics Findings: Na+/ K+:130/4.0 Glucose:150 BUN/Cr.:20/0.50   WBC:10 Hgb/Hct: 10.0/30.5  PCT: 0.32 Lactic acid: 2.3 COVID PCR: Negative, Troponin:  BNP: 418.2  pO2 82; pCO2 56; pH 7.4;  HCO3 34.7, %O2 Sat 97.9.    Patient given 30 cc/kg of fluids and started on broad-spectrum antibiotics Vanco cefepime and Flagyl for sepsis. PCCM consulted for admission.  Please see " Significant Hospital Events" section below for full detailed hospital course.  Pertinent  Medical History   Past Medical History:  Diagnosis Date   Arthritis    RA   Depression    Fibromyalgia    GERD (gastroesophageal reflux disease)    H/O drug dependence (HCC)    opiods   Wears dentures    full upper    Micro Data:  4/27: COVID-19/RSV/Flu PCR>>negative 4/27: Respiratory viral panel>>negative 4/27: Strep pneumo urinary antigen>>negative 4/27: Legionella urinary antigen>>negative 4/27: Blood culture x2>>negative 4/27: MRSA PCR>>negative 4/27: BAL>> no growth  Antimicrobials:   Anti-infectives (From admission, onward)    Start     Dose/Rate Route Frequency Ordered Stop   04/13/23 0345  Ampicillin-Sulbactam (UNASYN) 3 g in sodium chloride 0.9 % 100 mL IVPB        3 g 200 mL/hr over 30 Minutes Intravenous Every 6 hours 04/13/23 0248     04/01/23 0500  vancomycin (VANCOREADY) IVPB 500 mg/100 mL  Status:  Discontinued  500 mg 100 mL/hr over 60 Minutes Intravenous Every 24 hours 03/31/23 0643 04/01/23 1021   03/31/23 1800  ceFEPIme (MAXIPIME) 2 g in sodium chloride 0.9 % 100 mL IVPB  Status:  Discontinued        2 g 200 mL/hr over 30 Minutes Intravenous Every 12 hours 03/31/23 0643 04/04/23  1057   03/31/23 0100  vancomycin (VANCOCIN) IVPB 1000 mg/200 mL premix        1,000 mg 200 mL/hr over 60 Minutes Intravenous  Once 03/31/23 0048 03/31/23 0220   03/31/23 0100  ceFEPIme (MAXIPIME) 2 g in sodium chloride 0.9 % 100 mL IVPB        2 g 200 mL/hr over 30 Minutes Intravenous  Once 03/31/23 0048 03/31/23 0201        Significant Hospital Events: Including procedures, antibiotic start and stop dates in addition to other pertinent events   4/27: Chest Xray>> Endotracheal tube tip about 3.5 cm superior to carina. Esophageal tube tip below the diaphragm but incompletely visualized. Diffuse bilateral interstitial and hazy lung opacity, either representing edema or diffuse lung infection. More focal nodular opacity in the right mid lung may be infectious/ inflammatory or neoplastic in etiology. 4/27: CTA Chest/Abdomen/Pelvis>>Moderate left and small right pleural effusions. Emphysema. Diffuse bilateral septal thickening suggests underlying edema. Heterogeneous bilateral airspace consolidations throughout the lungs suspicious for multifocal pneumonia. Extensive cystic/low-density right supraclavicular, mediastinal, hilar, upper abdominal and retroperitoneal adenopathy. Differential considerations include necrotic metastatic disease, infectious etiology such as tuberculous or nontuberculous mycobacterial infection, and lymphoproliferative disease. Endotracheal tube tip about 1.2 cm superior to carina. Esophageal tube tip just beyond GE junction, suggest further advancement for more optimal positioning, there is moderate fluid distension of the stomach Small volume abdominopelvic ascites. Aortic Atherosclerosis 4/28: Pt remains mechanically intubated vent settings: FiO2 50%/PEEP 5.  Pt with severe anxiety on precedex and fentanyl gtts will start antianxiety medications per tubes  4/29: severe COPD, alert, unable to wean from vent 5/1: s/p PEG and LN biopsy 5/2: remains on vent, severe end stage  COPD, failure to wean from vent 5/3: remains on vent, severe COPD, failure to wean from vent 5/4: failure to wean from vent 5/6: Pt remains mechanically intubated heavily sedated with propofol/versed/fentanyl gtts due to severe anxiety/agitation pending trach placement 05/7 5/7: Pt underwent tracheostomy placement size #6 shiley per ENT.  Will start weaning sedatives  5/9: Patient awake, follows commands, on trach collar  5/10: Overnight pt severely agitated with increased WOB.  Required Propofol for a time, sedated this morning.  CXR with worsening of bilateral airspace opacities~ gently diuresed and placed on empiric Unasyn. 5/11: Pt weaned off versed gtt and sedated with precedex gtt @1 .2 mcg/kg/hr tolerating PS 10/5  Interim History / Subjective:  As outlined above under significant events   Objective   Blood pressure (!) 85/61, pulse 78, temperature 98.5 F (36.9 C), temperature source Oral, resp. rate (!) 28, height 5\' 1"  (1.549 m), weight 42.5 kg, SpO2 97 %.    Vent Mode: PSV FiO2 (%):  [35 %] 35 % PEEP:  [5 cmH20] 5 cmH20 Pressure Support:  [10 cmH20] 10 cmH20   Intake/Output Summary (Last 24 hours) at 04/15/2023 0817 Last data filed at 04/15/2023 0610 Gross per 24 hour  Intake 2067.32 ml  Output 2250 ml  Net -182.68 ml   Filed Weights   04/13/23 0515 04/14/23 0500 04/15/23 0357  Weight: 40.5 kg 42.9 kg 42.5 kg    Examination: General: Acute on chronically ill appearing cachectic  female, NAD mechanically ventilated via tracheostomy  HENT: Atraumatic, normocephalic, neck supple, no JVD, size #6 shiley tracheostomy midline clean/intact  Lungs: Diffuse rhonchi throughout, even, non labored  Cardiovascular: RRR, s1s2, no M/R/G, 2+ radial/1+ distal pulses, no edema  Abdomen: +BS x4, soft, mildly distended, PEG clean dry and intact Extremities: No deformities, no edema Neuro: Lightly sedated, following commands, PERRL GU: Foley catheter in place draining yellow  urine  Resolved Hospital Problem list     Assessment & Plan:   #Acute on Chronic Hypoxic & Hypercapnic Respiratory Failure due to AECOPD Presented with respiratory failure secondary to pneumonia and COPD exacerbation. Treated with antibiotics, steroids, and nebulizer therapy. She's failed to wean off the ventilator, and previously refused extubation during goals of care discussions. She is s/p tracheostomy,  - Full vent support, implement lung protective strategies - Plateau pressures less than 30 cm H20 - Wean FiO2 & PEEP as tolerated to maintain O2 sats 88 to 92% - Follow intermittent Chest X-ray & ABG as needed - Spontaneous Breathing Trials / Trach collar trials as tolerated - Implement VAP Bundle - Bronchodilators - ABX as above - Diuresis as BP and renal function permits   #HFrEF (Systolic and Diastolic dysfunction) #Demand ischemia TTE from 04/01/2023 with systolic and diastolic dysfunction. Troponin elevated on presentation (peak 871), attributed to demand. Hemodynamically stable. Continue to monitor. - Continuous cardiac monitoring - Maintain MAP >65 - Vasopressors as needed to maintain MAP goal ~ not requirijng - BNP was 634 - Diuresis as BP and renal function permits: 20 mg iv lasix x1 dose 05/12   #Hypomagnesia  - Trend BMP  - Replace electrolytes as indicated  - Monitor UOP  #Metastatic Malignancy #Extensive Stage Small Cell Lung Cancer (neuroendocrine) She had a lymph node biopsies secondary to concern for malignancy (with result returning positive for metastatic malignancy). Neuroendocrine/small cell lung cancer, extensive stage. Patient is not a current candidate for any chemotherapy, and oncology will hold off on evaluating her at the moment, thou she could benefit from being evaluated by them in the future.  #New leukocytosis concerning for possible aspiration pneumonia~improving   - Trend WBC and monitor fever curve  - Continue empiric Unasyn stop date    #Anxiety #Analgo-sedation She is now requiring dexmedetomidine and midazolam infusions for sedation, which we are attempting to wean off by increasing the dose of parenteral medications. -Maintain a RASS goal of 0 - Precedex gtt will attempt to wean versed gtt off first  - Continue scheduled valium, oxycodone, and seroquel  - Follow Qtc    #Gastrointestinal - Continue tube feeds. H2B for SUP - KUB pending due to mild abdominal distension    #Renal Kidney function at baseline. Continue to monitor and replete electrolytes as needed   #Endocrine - Continue SSI  - CBG's q4hrs      Pt with severe COPD, Metastatic Malignancy/Extensive Stage Small Cell Lung Cancer (neuroendocrine), overall long term prognosis is extremely poor.  Recommend DNR status.   Best Practice (right click and "Reselect all SmartList Selections" daily)   Diet/type: tubefeeds and NPO DVT prophylaxis: prophylactic heparin  GI prophylaxis: H2B Lines: Right upper extremity PICC line and still needed  Foley:  Yes, and it is still needed (urinary retention) Code Status:  full code Last date of multidisciplinary goals of care discussion [04/14/2023]  5/11: Will update pt's family when they arrive at bedside. Labs   CBC: Recent Labs  Lab 04/09/23 1117 04/10/23 0222 04/11/23 0737 04/12/23 1062 04/13/23 0445 04/14/23 6948  04/15/23 0402  WBC 9.5 10.0 9.2 10.0 12.2* 8.5 6.5  NEUTROABS 7.1 7.9*  --   --   --   --   --   HGB 9.7* 9.0* 9.0* 8.6* 8.9* 7.9* 7.4*  HCT 29.6* 27.1* 27.4* 26.5* 27.0* 24.4* 22.8*  MCV 95.2 94.8 95.5 94.3 94.4 95.7 95.4  PLT 286 263 268 266 297 246 219    Basic Metabolic Panel: Recent Labs  Lab 04/11/23 0431 04/12/23 0458 04/13/23 0445 04/14/23 0356 04/15/23 0402  NA 130* 136 134* 134* 140  K 3.7 3.3* 4.2 4.1 3.2*  CL 95* 103 99 101 101  CO2 29 26 27 27 25   GLUCOSE 128* 171* 116* 156* 185*  BUN 15 18 17 17 17   CREATININE <0.30* <0.30* 0.37* 0.39* 0.58  CALCIUM  7.8* 7.9* 8.4* 8.0* 7.2*  MG 1.7 1.7 2.0 1.6* 1.6*  PHOS 2.8 2.1* 4.0 3.7 4.2   GFR: Estimated Creatinine Clearance: 43.3 mL/min (by C-G formula based on SCr of 0.58 mg/dL). Recent Labs  Lab 04/12/23 0458 04/12/23 2219 04/13/23 0445 04/14/23 0356 04/15/23 0402  PROCALCITON  --  <0.10 0.18 0.12  --   WBC 10.0  --  12.2* 8.5 6.5    Liver Function Tests: Recent Labs  Lab 04/12/23 0458 04/13/23 0445 04/14/23 0356 04/15/23 0402  ALBUMIN 2.2* 2.3* 2.0* 2.0*   No results for input(s): "LIPASE", "AMYLASE" in the last 168 hours. No results for input(s): "AMMONIA" in the last 168 hours.  ABG    Component Value Date/Time   PHART 7.5 (H) 04/13/2023 0242   PCO2ART 38 04/13/2023 0242   PO2ART 79 (L) 04/13/2023 0242   HCO3 29.6 (H) 04/13/2023 0242   O2SAT 97 04/13/2023 0242     Coagulation Profile: Recent Labs  Lab 04/10/23 0222  INR 1.0    Cardiac Enzymes: No results for input(s): "CKTOTAL", "CKMB", "CKMBINDEX", "TROPONINI" in the last 168 hours.  HbA1C: HB A1C (BAYER DCA - WAIVED)  Date/Time Value Ref Range Status  01/05/2020 03:31 PM 4.8 <7.0 % Final    Comment:                                          Diabetic Adult            <7.0                                       Healthy Adult        4.3 - 5.7                                                           (DCCT/NGSP) American Diabetes Association's Summary of Glycemic Recommendations for Adults with Diabetes: Hemoglobin A1c <7.0%. More stringent glycemic goals (A1c <6.0%) may further reduce complications at the cost of increased risk of hypoglycemia.   11/19/2017 01:41 PM 4.9 <7.0 % Final    Comment:  Diabetic Adult            <7.0                                       Healthy Adult        4.3 - 5.7                                                           (DCCT/NGSP) American Diabetes Association's Summary of Glycemic Recommendations for Adults with Diabetes:  Hemoglobin A1c <7.0%. More stringent glycemic goals (A1c <6.0%) may further reduce complications at the cost of increased risk of hypoglycemia.    Hgb A1c MFr Bld  Date/Time Value Ref Range Status  04/01/2023 03:59 AM 6.2 (H) 4.8 - 5.6 % Final    Comment:    (NOTE) Pre diabetes:          5.7%-6.4%  Diabetes:              >6.4%  Glycemic control for   <7.0% adults with diabetes   07/18/2022 01:12 AM 5.1 4.8 - 5.6 % Final    Comment:    (NOTE) Pre diabetes:          5.7%-6.4%  Diabetes:              >6.4%  Glycemic control for   <7.0% adults with diabetes     CBG: Recent Labs  Lab 04/14/23 1621 04/14/23 1914 04/14/23 2312 04/15/23 0409 04/15/23 0749  GLUCAP 99 109* 154* 175* 133*    Review of Systems:   Unable to assess due to sedation  Past Medical History:  She,  has a past medical history of Arthritis, Depression, Fibromyalgia, GERD (gastroesophageal reflux disease), H/O drug dependence (HCC), and Wears dentures.   Surgical History:   Past Surgical History:  Procedure Laterality Date   BREAST SURGERY     Silicone implants, then removal   CATARACT EXTRACTION W/PHACO Left 05/06/2019   Procedure: CATARACT EXTRACTION PHACO AND INTRAOCULAR LENS PLACEMENT (IOC) LEFT;  Surgeon: Galen Manila, MD;  Location: [facility];  Service: Ophthalmology;  Laterality: Left;   IR GASTROSTOMY TUBE MOD SED  04/04/2023   IR US LIVER BIOPSY  04/04/2023   TRACHEOSTOMY TUBE PLACEMENT N/A 04/10/2023   Procedure: TRACHEOSTOMY;  Surgeon: Linus Salmons, MD;  Location: [facility];  Service: ENT;  Laterality: N/A;   TUBAL LIGATION       Social History:   reports that she has been smoking cigarettes. She has a 14.00 pack-year smoking history. She has never used smokeless tobacco. She reports that she does not currently use alcohol. She reports that she does not use drugs.   Family History:  Her family history includes COPD in her sister; Suicidality in her father.    Allergies Allergies  Allergen Reactions   Gabapentin Anaphylaxis     Home Medications  Prior to Admission medications   Medication Sig Start Date End Date Taking? Authorizing Provider  naloxone [facility]) nasal spray 4 mg/0.1 mL 1 spray into the nostril 1x, may repeat dose in alternate nostrils every 2-3 minutes until EMS arrives 03/29/23  Yes Johnson, Megan P, DO  promethazine (PHENERGAN) 25 MG tablet TAKE 1/2 TABLET BY MOUTH EVERY  8 HOURS AS NEEDED FOR NAUSEA AND VOMITING 07/31/22  Yes Johnson, Megan P, DO  albuterol (VENTOLIN HFA) 108 (90 Base) MCG/ACT inhaler Inhale 2 puffs into the lungs every 6 (six) hours as needed for wheezing or shortness of breath. Patient not taking: Reported on 03/29/2023 07/23/22   Kathrynn Running, MD  clonazePAM (KLONOPIN) 0.5 MG tablet Take by mouth.    [provider]  Fluticasone-Umeclidin-Vilant (TRELEGY ELLIPTA) 100-62.5-25 MCG/ACT AEPB Inhale 1 Inhalation into the lungs daily in the afternoon. Patient not taking: Reported on 03/29/2023 07/31/22   Dorcas Carrow, DO     Critical care time: 32 minutes     Zada Girt, [facility]  Pulmonary/Critical Care Pager [phone] (please enter 7 digits) PCCM Consult Pager [phone] (please enter 7 digits)

## 2023-04-15 NOTE — TOC Progression Note (Signed)
Transition of Care [facility]) - Progression Note    Patient Details  Name: Vanessa Oneal MRN: [id number] Date of Birth: [birth year]  [facility] CM/SW Contact  Carmina Miller, [place] Phone Number: 04/15/2023, 2:46 PM  Clinical Narrative:     CSW spoke with pt's son [name] regarding LTACH placement, [name] is struggling with making a decision as his mother has previously told him she did not want to be in a nursing home. [name] states he is on the way to the hospital along with other family members and will speak with the doctor and anticipates a decision will be made today or tomorrow. TOC will continue to follow.   Expected Discharge Plan: Long Term Acute Care (LTAC) Barriers to Discharge: Continued Medical Work up  Expected Discharge Plan and Services       Living arrangements for the past 2 months: Apartment                                       Social Determinants of Health (SDOH) Interventions SDOH Screenings   Food Insecurity: No Food Insecurity (03/31/2023)  Housing: Low Risk  (03/31/2023)  Transportation Needs: No Transportation Needs (03/31/2023)  Utilities: Not At Risk (03/31/2023)  Alcohol Screen: Low Risk  (07/31/2022)  Depression (PHQ2-9): Medium Risk (07/31/2022)  Financial Resource Strain: Low Risk  (07/31/2022)  Physical Activity: Insufficiently Active (07/31/2022)  Social Connections: Socially Isolated (07/31/2022)  Stress: Stress Concern Present (07/31/2022)  Tobacco Use: High Risk (04/11/2023)    Readmission Risk Interventions     No data to display

## 2023-04-15 NOTE — Progress Notes (Signed)
Daily Progress Note   Patient Name: Vanessa Oneal       Date: 04/15/2023 DOB: May 08, 1951  Age: 72 y.o. MRN#: [id number] Attending Physician: Raechel Chute, MD Primary Care Physician: Dorcas Carrow, DO Admit Date: 03/31/2023  Reason for Consultation/Follow-up: Establishing goals of care  HPI/Brief Hospital Review: 72 y.o female w/ significant PMH of COPD, Emphysema, GERD, RA, Fibromyalgia, tobacco abuse, h/o drug abuse (cocaine). EtOH Abuse, Anxiety and Depression and recent diagnosis of chest wall mass concerning for Lymphadenopathy who presented to the ED with chief complaints of acute respiratory distress.   On review of chart, patient recently presented to [facility] emergency room on 03/27/2023 with complaints of chest pain.  CTA chest and CT abdomen pelvis with contrast was obtained which showed extensive mediastinal soft tissue mass and a large retroperitoneal soft tissue mass favored to represent lymphoma. Patient was referred to outpatient oncology for further evaluation.  Patient was evaluated by oncologist Dr. Cathie Hoops on 03/29/2023 who recommended a PET scan and ultrasound-guided biopsy of the right supraclavicular lymphadenopathy for further evaluation.  She also had myeloma panel and peripheral blood flow cytometric and LDH obtained.  Prior to presenting to the ED, patient had been complaining of progressive shortness of breath  all day and was found with oxygen saturation of 66% on room air.  She was placed on a nonrebreather and given Solu-Medrol 125 and 2 albuterol DuoNebs prior to transporting to the ED for further evaluation.  Palliative Medicine consulted for assisting with goals of care conversations.  Subjective: Extensive chart review has been completed prior to meeting patient  including labs, vital signs, imaging, progress notes, orders, and available advanced directive documents from current and previous encounters.    Requested to attempt to reengage with family by CCM team due to new diagnosis of neuroendocrine/small cell lung cancer, extensive staging. Called and spoke with [name]-niece who shared she would attempt to connect with both sons, [name] and [name] and plan to visit bedside later this afternoon.  Initially met with [name], NP and Dr. Aundria Rud with CCM team. Dr. Aundria Rud provided [name] with most recent medical updates including new diagnosis of advanced lung cancer. Shared that at this time she is not a candidate for treatment deemed by oncology team due to severe deconditioning and advanced lung disease. Reviewed her inability to  be liberalized from ventilator and her requirements for [facility] placement. Reviewed her need for extensive dosing of medications to treat anxiety and agitation and signs of ongoing suffering. Recommendations were made to transition to comfort care. [name] shares the disconnect in his relationship with he and his mother. He shares his struggling with making and being involved in decision making process. He hopes to be able to connect with his brother-[name].  Later called back by CCM team as son-[name] and niece-[name] had arrived. Again, updates provided as noted above from Dr. Aundria Rud. Recommendations again made to transition to comfort care. [name] shares he needs time for processing. Plans to revisit again tomorrow with anticipation to transition to comfort care at that time.  Emotional support provided to family. Answered and addressed all questions and concerns. Appreciate support from CCM team. PMT to continue to follow for ongoing needs and support.  Objective:  Vital Signs: BP 108/67   Pulse 90   Temp 98.8 F (37.1 C) (Oral)   Resp (!) 39   Ht 5\' 1"  (1.549 m)   Wt 42.5 kg   SpO2 94%   BMI 17.70 kg/m  SpO2: SpO2: 94 % O2  Device: O2 Device: Ventilator O2 Flow Rate: O2 Flow Rate (L/min): 5 L/min   Palliative Care Assessment & Plan   Assessment/Recommendation/Plan  DNR as agreed on by CCM team and family Anticipate further family discussions 5/13 with likely transition to comfort measures  Care plan was discussed with CCM team and nursing staff  Thank you for allowing the Palliative Medicine Team to assist in the care of this patient.  Total time:  50 minutes  Time spent includes: Detailed review of medical records (labs, imaging, vital signs), medically appropriate exam (mental status, respiratory, cardiac, skin), discussed with treatment team, counseling and educating patient, family and staff, documenting clinical information, medication management and coordination of care.  Leeanne Deed, DNP, AGNP-C Palliative Medicine   Please contact Palliative Medicine Team phone at [phone] for questions and concerns.

## 2023-04-16 ENCOUNTER — Inpatient Hospital Stay: Payer: 59

## 2023-04-16 DIAGNOSIS — J9601 Acute respiratory failure with hypoxia: Secondary | ICD-10-CM | POA: Diagnosis not present

## 2023-04-16 DIAGNOSIS — J441 Chronic obstructive pulmonary disease with (acute) exacerbation: Secondary | ICD-10-CM | POA: Diagnosis not present

## 2023-04-16 DIAGNOSIS — J9621 Acute and chronic respiratory failure with hypoxia: Secondary | ICD-10-CM | POA: Diagnosis not present

## 2023-04-16 DIAGNOSIS — J189 Pneumonia, unspecified organism: Secondary | ICD-10-CM | POA: Diagnosis not present

## 2023-04-16 LAB — GLUCOSE, CAPILLARY
Glucose-Capillary: 130 mg/dL — ABNORMAL HIGH (ref 70–99)
Glucose-Capillary: 133 mg/dL — ABNORMAL HIGH (ref 70–99)
Glucose-Capillary: 145 mg/dL — ABNORMAL HIGH (ref 70–99)
Glucose-Capillary: 121 mg/dL — ABNORMAL HIGH (ref 70–99)
Glucose-Capillary: 111 mg/dL — ABNORMAL HIGH (ref 70–99)
Glucose-Capillary: 155 mg/dL — ABNORMAL HIGH (ref 70–99)

## 2023-04-16 LAB — CBC
HCT: 22.1 % — ABNORMAL LOW (ref 36.0–46.0)
Hemoglobin: 7.3 g/dL — ABNORMAL LOW (ref 12.0–15.0)
MCH: 31.1 pg (ref 26.0–34.0)
MCHC: 33 g/dL (ref 30.0–36.0)
MCV: 94 fL (ref 80.0–100.0)
Platelets: 221 10*3/uL (ref 150–400)
RBC: 2.35 MIL/uL — ABNORMAL LOW (ref 3.87–5.11)
RDW: 13.5 % (ref 11.5–15.5)
WBC: 5.6 10*3/uL (ref 4.0–10.5)
nRBC: 0 % (ref 0.0–0.2)

## 2023-04-16 LAB — RENAL FUNCTION PANEL
Albumin: 1.9 g/dL — ABNORMAL LOW (ref 3.5–5.0)
Anion gap: 18 — ABNORMAL HIGH (ref 5–15)
BUN: 15 mg/dL (ref 8–23)
CO2: 25 mmol/L (ref 22–32)
Calcium: 7.2 mg/dL — ABNORMAL LOW (ref 8.9–10.3)
Chloride: 98 mmol/L (ref 98–111)
Creatinine, Ser: 0.59 mg/dL (ref 0.44–1.00)
GFR, Estimated: >60 mL/min (ref >60)
Glucose, Bld: 132 mg/dL — ABNORMAL HIGH (ref 70–99)
Phosphorus: 3.6 mg/dL (ref 2.5–4.6)
Potassium: 3.4 mmol/L — ABNORMAL LOW (ref 3.5–5.1)
Sodium: 141 mmol/L (ref 135–145)

## 2023-04-16 LAB — MAGNESIUM: Magnesium: 1.7 mg/dL (ref 1.7–2.4)

## 2023-04-16 MED ORDER — FENTANYL CITRATE (PF) 100 MCG/2ML IJ SOLN
INTRAMUSCULAR | Status: AC
  Filled 2023-04-16: qty 2

## 2023-04-16 MED ORDER — MAGNESIUM SULFATE 2 GM/50ML IV SOLN
2.0000 g | Freq: Once | INTRAVENOUS | Status: AC
  Administered 2023-04-16: 2 g via INTRAVENOUS
  Filled 2023-04-16: qty 50

## 2023-04-16 MED ORDER — IPRATROPIUM-ALBUTEROL 0.5-2.5 (3) MG/3ML IN SOLN
3.0000 mL | Freq: Four times a day (QID) | RESPIRATORY_TRACT | Status: DC
  Administered 2023-04-17 – 2023-04-20 (×15): 3 mL via RESPIRATORY_TRACT
  Filled 2023-04-16 (×14): qty 3

## 2023-04-16 MED ORDER — FENTANYL CITRATE PF 50 MCG/ML IJ SOSY
50.0000 ug | PREFILLED_SYRINGE | Freq: Once | INTRAMUSCULAR | Status: AC
  Administered 2023-04-16: 50 ug via INTRAVENOUS

## 2023-04-16 MED ORDER — POTASSIUM CHLORIDE 20 MEQ PO PACK
40.0000 meq | PACK | Freq: Once | ORAL | Status: AC
  Administered 2023-04-16: 40 meq
  Filled 2023-04-16: qty 2

## 2023-04-16 NOTE — Progress Notes (Signed)
NAME:  Vanessa Oneal, MRN:  [id number], DOB:  [birth year], LOS: 16 ADMISSION DATE:  03/31/2023, CONSULTATION DATE:  03/31/2023 REFERRING MD:  ED Provider, CHIEF COMPLAINT:  Respiratory Failure   Brief Pt Description / Synopsis:  Patient is a 72 year old female with a past medical history of COPD/emphysema presenting to the hospital with acute on chronic hypoxic and hypercapnic respiratory due to Acute COPD Exacerbation requiring intubation and mechanical ventilation. Course has been complicated by failure to wean off the ventilator secondary to extremely elevated sedation requirements.   History of Present Illness:  72 y.o female w/ significant PMH of COPD, Emphysema, GERD, RA, Fibromyalgia, tobacco abuse, h/o drug abuse (cocaine). EtOH Abuse, Anxiety and Depression and recent diagnosis of chest wall mass concerning for Lymphadenopathy who presented to the ED with chief complaints of acute respiratory distress.   On review of chart, patient recently presented to [facility] emergency room on 03/27/2023 with complaints of chest pain.  CTA chest and CT abdomen pelvis with contrast was obtained which showed extensive mediastinal soft tissue mass and a large retroperitoneal soft tissue mass favored to represent lymphoma. Patient was referred to outpatient oncology for further evaluation.  Patient was evaluated by oncologist Dr. Cathie Hoops on 03/29/2023 who recommended a PET scan and ultrasound-guided biopsy of the right supraclavicular lymphadenopathy for further evaluation.  She also had myeloma panel and peripheral blood flow cytometric and LDH obtained.  Prior to presenting to the ED, patient had been complaining of progressive shortness of breath  all day and was found with oxygen saturation of 66% on room air.  She was placed on a nonrebreather and given Solu-Medrol 125 and 2 albuterol DuoNebs prior to transporting to the ED for further evaluation.   ED Course: Initial vital signs showed HR of 77 beats/minute, BP  90/60 mm Hg, the RR 22 breaths/minute, and the oxygen saturation 78% on NRB and a temperature of 98.73F (36.8C).  He was noted to be in acute respiratory distress with increased work of breathing.  Due to concerns for impending respiratory arrest, patient was intubated for airway protection.   04/16/23- patient remains mildy aggitated this am despite versed and precedex drip infusing, she is receiving OG feeds.  Plan for family meeting and potentially de-escalation of therapy this afternoon.      Patient given 30 cc/kg of fluids and started on broad-spectrum antibiotics Vanco cefepime and Flagyl for sepsis. PCCM consulted for admission.  Please see " Significant Hospital Events" section below for full detailed hospital course.  Pertinent  Medical History   Past Medical History:  Diagnosis Date   Arthritis    RA   Depression    Fibromyalgia    GERD (gastroesophageal reflux disease)    H/O drug dependence (HCC)    opiods   Wears dentures    full upper    Micro Data:  4/27: COVID-19/RSV/Flu PCR>>negative 4/27: Respiratory viral panel>>negative 4/27: Strep pneumo urinary antigen>>negative 4/27: Legionella urinary antigen>>negative 4/27: Blood culture x2>>negative 4/27: MRSA PCR>>negative 4/27: BAL>> no growth  Antimicrobials:   Anti-infectives (From admission, onward)    Start     Dose/Rate Route Frequency Ordered Stop   04/15/23 1000  Ampicillin-Sulbactam (UNASYN) 3 g in sodium chloride 0.9 % 100 mL IVPB        3 g 200 mL/hr over 30 Minutes Intravenous Every 6 hours 04/15/23 0826  0959   04/13/23 0345  Ampicillin-Sulbactam (UNASYN) 3 g in sodium chloride 0.9 % 100 mL IVPB  Status:  Discontinued  3 g 200 mL/hr over 30 Minutes Intravenous Every 6 hours 04/13/23 0248 04/15/23 0826   04/01/23 0500  vancomycin (VANCOREADY) IVPB 500 mg/100 mL  Status:  Discontinued        500 mg 100 mL/hr over 60 Minutes Intravenous Every 24 hours 03/31/23 0643 04/01/23 1021    03/31/23 1800  ceFEPIme (MAXIPIME) 2 g in sodium chloride 0.9 % 100 mL IVPB  Status:  Discontinued        2 g 200 mL/hr over 30 Minutes Intravenous Every 12 hours 03/31/23 0643 04/04/23 1057   03/31/23 0100  vancomycin (VANCOCIN) IVPB 1000 mg/200 mL premix        1,000 mg 200 mL/hr over 60 Minutes Intravenous  Once 03/31/23 0048 03/31/23 0220   03/31/23 0100  ceFEPIme (MAXIPIME) 2 g in sodium chloride 0.9 % 100 mL IVPB        2 g 200 mL/hr over 30 Minutes Intravenous  Once 03/31/23 0048 03/31/23 0201        Significant Hospital Events: Including procedures, antibiotic start and stop dates in addition to other pertinent events   4/27: Chest Xray>> Endotracheal tube tip about 3.5 cm superior to carina. Esophageal tube tip below the diaphragm but incompletely visualized. Diffuse bilateral interstitial and hazy lung opacity, either representing edema or diffuse lung infection. More focal nodular opacity in the right mid lung may be infectious/ inflammatory or neoplastic in etiology. 4/27: CTA Chest/Abdomen/Pelvis>>Moderate left and small right pleural effusions. Emphysema. Diffuse bilateral septal thickening suggests underlying edema. Heterogeneous bilateral airspace consolidations throughout the lungs suspicious for multifocal pneumonia. Extensive cystic/low-density right supraclavicular, mediastinal, hilar, upper abdominal and retroperitoneal adenopathy. Differential considerations include necrotic metastatic disease, infectious etiology such as tuberculous or nontuberculous mycobacterial infection, and lymphoproliferative disease. Endotracheal tube tip about 1.2 cm superior to carina. Esophageal tube tip just beyond GE junction, suggest further advancement for more optimal positioning, there is moderate fluid distension of the stomach Small volume abdominopelvic ascites. Aortic Atherosclerosis 4/28: Pt remains mechanically intubated vent settings: FiO2 50%/PEEP 5.  Pt with severe anxiety on  precedex and fentanyl gtts will start antianxiety medications per tubes  4/29: severe COPD, alert, unable to wean from vent 5/1: s/p PEG and LN biopsy 5/2: remains on vent, severe end stage COPD, failure to wean from vent 5/3: remains on vent, severe COPD, failure to wean from vent 5/4: failure to wean from vent 5/6: Pt remains mechanically intubated heavily sedated with propofol/versed/fentanyl gtts due to severe anxiety/agitation pending trach placement 05/7 5/7: Pt underwent tracheostomy placement size #6 shiley per ENT.  Will start weaning sedatives  5/9: Patient awake, follows commands, on trach collar  5/10: Overnight pt severely agitated with increased WOB.  Required Propofol for a time, sedated this morning.  CXR with worsening of bilateral airspace opacities~ gently diuresed and placed on empiric Unasyn. 5/11: Pt weaned off versed gtt and sedated with precedex gtt @1 .2 mcg/kg/hr tolerating PS 10/5  Interim History / Subjective:  As outlined above under significant events   Objective   Blood pressure 113/78, pulse 94, temperature 99 F (37.2 C), resp. rate (!) 40, height 5\' 1"  (1.549 m), weight 41.7 kg, SpO2 94 %.    Vent Mode: PSV FiO2 (%):  [35 %] 35 % PEEP:  [5 cmH20] 5 cmH20 Pressure Support:  [10 cmH20] 10 cmH20 Plateau Pressure:  [17 cmH20-33 cmH20] 33 cmH20   Intake/Output Summary (Last 24 hours) at 04/16/2023 0857 Last data filed at 04/16/2023 0716 Gross per 24 hour  Intake  2361.09 ml  Output 3045 ml  Net -683.91 ml    Filed Weights   04/14/23 0500 04/15/23 0357 04/16/23 0425  Weight: 42.9 kg 42.5 kg 41.7 kg    Examination: General: Acute on chronically ill appearing cachectic female, NAD mechanically ventilated via tracheostomy  HENT: Atraumatic, normocephalic, neck supple, no JVD, size #6 shiley tracheostomy midline clean/intact  Lungs: Diffuse rhonchi throughout, even, non labored  Cardiovascular: RRR, s1s2, no M/R/G, 2+ radial/1+ distal pulses, no edema   Abdomen: +BS x4, soft, mildly distended, PEG clean dry and intact Extremities: No deformities, no edema Neuro: Lightly sedated, following commands, PERRL GU: Foley catheter in place draining yellow urine  Resolved Hospital Problem list     Assessment & Plan:   #Acute on Chronic Hypoxic & Hypercapnic Respiratory Failure due to AECOPD Presented with respiratory failure secondary to pneumonia and COPD exacerbation. Treated with antibiotics, steroids, and nebulizer therapy. She's failed to wean off the ventilator, and previously refused extubation during goals of care discussions. She is s/p tracheostomy,  - Full vent support, implement lung protective strategies - Plateau pressures less than 30 cm H20 - Wean FiO2 & PEEP as tolerated to maintain O2 sats 88 to 92% - Follow intermittent Chest X-ray & ABG as needed - Spontaneous Breathing Trials / Trach collar trials as tolerated - Implement VAP Bundle - Bronchodilators - ABX as above - Diuresis as BP and renal function permits   #HFrEF (Systolic and Diastolic dysfunction) #Demand ischemia TTE from 04/01/2023 with systolic and diastolic dysfunction. Troponin elevated on presentation (peak 871), attributed to demand. Hemodynamically stable. Continue to monitor. - Continuous cardiac monitoring - Maintain MAP >65 - Vasopressors as needed to maintain MAP goal ~ not requirijng - BNP was 634 - Diuresis as BP and renal function permits: 20 mg iv lasix x1 dose 05/12   #Hypomagnesia  - Trend BMP  - Replace electrolytes as indicated  - Monitor UOP  #Metastatic Malignancy #Extensive Stage Small Cell Lung Cancer (neuroendocrine) She had a lymph node biopsies secondary to concern for malignancy (with result returning positive for metastatic malignancy). Neuroendocrine/small cell lung cancer, extensive stage. Patient is not a current candidate for any chemotherapy, and oncology will hold off on evaluating her at the moment, thou she could benefit  from being evaluated by them in the future.  #New leukocytosis concerning for possible aspiration pneumonia~improving   - Trend WBC and monitor fever curve  - Continue empiric Unasyn stop date   #Anxiety #Analgo-sedation She is now requiring dexmedetomidine and midazolam infusions for sedation, which we are attempting to wean off by increasing the dose of parenteral medications. -Maintain a RASS goal of 0 - Precedex gtt will attempt to wean versed gtt off first  - Continue scheduled valium, oxycodone, and seroquel  - Follow Qtc    #Gastrointestinal - Continue tube feeds. H2B for SUP - KUB pending due to mild abdominal distension    #Renal Kidney function at baseline. Continue to monitor and replete electrolytes as needed   #Endocrine - Continue SSI  - CBG's q4hrs      Pt with severe COPD, Metastatic Malignancy/Extensive Stage Small Cell Lung Cancer (neuroendocrine), overall long term prognosis is extremely poor.  Recommend DNR status.   Best Practice (right click and "Reselect all SmartList Selections" daily)   Diet/type: tubefeeds and NPO DVT prophylaxis: prophylactic heparin  GI prophylaxis: H2B Lines: Right upper extremity PICC line and still needed  Foley:  Yes, and it is still needed (urinary retention) Code Status:  full code Last date of multidisciplinary goals of care discussion [04/14/2023]  5/11: Will update pt's family when they arrive at bedside. Labs   CBC: Recent Labs  Lab 04/09/23 1117 04/10/23 0222 04/11/23 0431 04/12/23 0458 04/13/23 0445 04/14/23 0356 04/15/23 0402 04/16/23 0419  WBC 9.5 10.0   < > 10.0 12.2* 8.5 6.5 5.6  NEUTROABS 7.1 7.9*  --   --   --   --   --   --   HGB 9.7* 9.0*   < > 8.6* 8.9* 7.9* 7.4* 7.3*  HCT 29.6* 27.1*   < > 26.5* 27.0* 24.4* 22.8* 22.1*  MCV 95.2 94.8   < > 94.3 94.4 95.7 95.4 94.0  PLT 286 263   < > 266 297 246 219 221   < > = values in this interval not displayed.     Basic Metabolic Panel: Recent  Labs  Lab 04/12/23 0458 04/13/23 0445 04/14/23 0356 04/15/23 0402 04/16/23 0419  NA 136 134* 134* 140 141  K 3.3* 4.2 4.1 3.2* 3.4*  CL 103 99 101 101 98  CO2 26 27 27 25 25   GLUCOSE 171* 116* 156* 185* 132*  BUN 18 17 17 17 15   CREATININE <0.30* 0.37* 0.39* 0.58 0.59  CALCIUM 7.9* 8.4* 8.0* 7.2* 7.2*  MG 1.7 2.0 1.6* 1.6* 1.7  PHOS 2.1* 4.0 3.7 4.2 3.6    GFR: Estimated Creatinine Clearance: 42.5 mL/min (by C-G formula based on SCr of 0.59 mg/dL). Recent Labs  Lab 04/12/23 2219 04/13/23 0445 04/14/23 0356 04/15/23 0402 04/16/23 0419  PROCALCITON <0.10 0.18 0.12  --   --   WBC  --  12.2* 8.5 6.5 5.6     Liver Function Tests: Recent Labs  Lab 04/12/23 0458 04/13/23 0445 04/14/23 0356 04/15/23 0402 04/16/23 0419  ALBUMIN 2.2* 2.3* 2.0* 2.0* 1.9*    No results for input(s): "LIPASE", "AMYLASE" in the last 168 hours. No results for input(s): "AMMONIA" in the last 168 hours.  ABG    Component Value Date/Time   PHART 7.5 (H) 04/13/2023 0242   PCO2ART 38 04/13/2023 0242   PO2ART 79 (L) 04/13/2023 0242   HCO3 29.6 (H) 04/13/2023 0242   O2SAT 97 04/13/2023 0242     Coagulation Profile: Recent Labs  Lab 04/10/23 0222  INR 1.0     Cardiac Enzymes: No results for input(s): "CKTOTAL", "CKMB", "CKMBINDEX", "TROPONINI" in the last 168 hours.  HbA1C: HB A1C (BAYER DCA - WAIVED)  Date/Time Value Ref Range Status  01/05/2020 03:31 PM 4.8 <7.0 % Final    Comment:                                          Diabetic Adult            <7.0                                       Healthy Adult        4.3 - 5.7                                                           (  DCCT/NGSP) American Diabetes Association's Summary of Glycemic Recommendations for Adults with Diabetes: Hemoglobin A1c <7.0%. More stringent glycemic goals (A1c <6.0%) may further reduce complications at the cost of increased risk of hypoglycemia.   11/19/2017 01:41 PM 4.9 <7.0 % Final    Comment:                                           Diabetic Adult            <7.0                                       Healthy Adult        4.3 - 5.7                                                           (DCCT/NGSP) American Diabetes Association's Summary of Glycemic Recommendations for Adults with Diabetes: Hemoglobin A1c <7.0%. More stringent glycemic goals (A1c <6.0%) may further reduce complications at the cost of increased risk of hypoglycemia.    Hgb A1c MFr Bld  Date/Time Value Ref Range Status  04/01/2023 03:59 AM 6.2 (H) 4.8 - 5.6 % Final    Comment:    (NOTE) Pre diabetes:          5.7%-6.4%  Diabetes:              >6.4%  Glycemic control for   <7.0% adults with diabetes   07/18/2022 01:12 AM 5.1 4.8 - 5.6 % Final    Comment:    (NOTE) Pre diabetes:          5.7%-6.4%  Diabetes:              >6.4%  Glycemic control for   <7.0% adults with diabetes     CBG: Recent Labs  Lab 04/15/23 1547 04/15/23 1915 04/15/23 2313 04/16/23 0423 04/16/23 0719  GLUCAP 120* 131* 150* 130* 133*     Review of Systems:   Unable to assess due to sedation  Past Medical History:  She,  has a past medical history of Arthritis, Depression, Fibromyalgia, GERD (gastroesophageal reflux disease), H/O drug dependence (HCC), and Wears dentures.   Surgical History:   Past Surgical History:  Procedure Laterality Date   BREAST SURGERY     Silicone implants, then removal   CATARACT EXTRACTION W/PHACO Left 05/06/2019   Procedure: CATARACT EXTRACTION PHACO AND INTRAOCULAR LENS PLACEMENT (IOC) LEFT;  Surgeon: Galen Manila, MD;  Location: [facility];  Service: Ophthalmology;  Laterality: Left;   IR GASTROSTOMY TUBE MOD SED  04/04/2023   IR US LIVER BIOPSY  04/04/2023   TRACHEOSTOMY TUBE PLACEMENT N/A 04/10/2023   Procedure: TRACHEOSTOMY;  Surgeon: Linus Salmons, MD;  Location: [facility];  Service: ENT;  Laterality: N/A;   TUBAL LIGATION       Social History:   reports that she  has been smoking cigarettes. She has a 14.00 pack-year smoking history. She has never used smokeless tobacco. She reports that she does not currently use alcohol. She reports that she does not use drugs.   Family History:  Her family  history includes COPD in her sister; Suicidality in her father.   Allergies Allergies  Allergen Reactions   Gabapentin Anaphylaxis     Home Medications  Prior to Admission medications   Medication Sig Start Date End Date Taking? Authorizing Provider  naloxone [facility]) nasal spray 4 mg/0.1 mL 1 spray into the nostril 1x, may repeat dose in alternate nostrils every 2-3 minutes until EMS arrives 03/29/23  Yes Johnson, Megan P, DO  promethazine (PHENERGAN) 25 MG tablet TAKE 1/2 TABLET BY MOUTH EVERY 8 HOURS AS NEEDED FOR NAUSEA AND VOMITING 07/31/22  Yes Johnson, Megan P, DO  albuterol (VENTOLIN HFA) 108 (90 Base) MCG/ACT inhaler Inhale 2 puffs into the lungs every 6 (six) hours as needed for wheezing or shortness of breath. Patient not taking: Reported on 03/29/2023 07/23/22   Kathrynn Running, MD  clonazePAM (KLONOPIN) 0.5 MG tablet Take by mouth.    [provider]  Fluticasone-Umeclidin-Vilant (TRELEGY ELLIPTA) 100-62.5-25 MCG/ACT AEPB Inhale 1 Inhalation into the lungs daily in the afternoon. Patient not taking: Reported on 03/29/2023 07/31/22   Dorcas Carrow, DO     Critical care time: 32 minutes     Zada Girt, [facility]  Pulmonary/Critical Care Pager [phone] (please enter 7 digits) PCCM Consult Pager [phone] (please enter 7 digits)

## 2023-04-16 NOTE — IPAL (Addendum)
  Interdisciplinary Goals of Care Family Meeting   Date carried out: 04/16/2023  Location of the meeting: Bedside  Member's involved: Nurse Practitioner and Family Member or next of kin  Durable Power of Attorney or acting medical decision maker: Pt's son Vanessa Oneal and pt's niece Vanessa Oneal  Discussion: We discussed goals of care for Vanessa Oneal .  Followed up on goals of care conversations from yesterday.  We discussed very poor overall long-term prognosis given end-stage COPD and new diagnosis of neuroendocrine/small cell lung cancer which is extensive staging (currently not a candidate for chemo).  We discussed that she remains ventilator dependent, requiring high-dose of sedatives to ensure that she is comfortable and able to ventilate.  Expressed that when not sedated she is suffocating with increased work of breathing.  They note that she appears more comfortable today which is making it difficult for them to "pull the plug and withdraw care".  I reiterated to them that she currently looks comfortable due to sedation, and that when sedation is lightened even so slightly, she begins to suffocate and is suffering.  Discussed that quality of life for her is going to be extremely poor and that she will be ventilator and facility dependent.  They both voiced that they understand that she does not have long left to live, and do not want her to suffer, however they would like to spend more time with her.  They request that we continue current measures for now, remain DNR status, and they will let us know when they are ready to consider transitioning to comfort measures.  Code status:   Code Status: DNR   Disposition: Continue current acute care  Time spent for the meeting: 15 minutes     Harlon Ditty, AGACNP-BC [facility] Prefer epic messenger for cross cover needs If after hours, please call E-link   Judithe Modest, NP  04/16/2023, 4:53 PM

## 2023-04-16 NOTE — Progress Notes (Signed)
Palliative Care Progress Note, Assessment & Plan   Patient Name: Vanessa Oneal       Date: 04/16/2023 DOB: 09/13/1951  Age: 72 y.o. MRN#: [id number] Attending Physician: Vida Rigger, MD Primary Care Physician: Dorcas Carrow, DO Admit Date: 03/31/2023  Subjective: Patient is lying in bed in no apparent distress.  She acknowledges my presence.  She is unable to speak and verbally make her wishes known.  She is able to appropriately inconsistently answer yes/no questions by blinking.  No family or friends present during my visit.  HPI: 72 y.o female w/ significant PMH of COPD, Emphysema, GERD, RA, Fibromyalgia, tobacco abuse, h/o drug abuse (cocaine). EtOH Abuse, Anxiety and Depression and recent diagnosis of chest wall mass concerning for Lymphadenopathy who presented to the ED with chief complaints of acute respiratory distress.   On review of chart, patient recently presented to [facility] emergency room on 03/27/2023 with complaints of chest pain.  CTA chest and CT abdomen pelvis with contrast was obtained which showed extensive mediastinal soft tissue mass and a large retroperitoneal soft tissue mass favored to represent lymphoma. Patient was referred to outpatient oncology for further evaluation.  Patient was evaluated by oncologist Dr. Cathie Hoops on 03/29/2023 who recommended a PET scan and ultrasound-guided biopsy of the right supraclavicular lymphadenopathy for further evaluation - never completed PTA.  Prior to presenting to the ED, patient had been complaining of progressive shortness of breath  all day and was found with oxygen saturation of 66% on room air.  She was placed on a nonrebreather and given Solu-Medrol 125 and 2 albuterol DuoNebs prior to transporting to the ED for further evaluation.  5/7 - trach  placed  5/12 - GOC discussions with patient's sons and niece. Diagnosis of neuroendocrine/SCLC confirmed.    Palliative Medicine consulted for assisting with goals of care conversations.  Summary of counseling/coordination of care: After reviewing the patient's chart and assessing the patient at bedside, I attempted to assess patient's symptom burden.  Using 1 blink for yes and 2 blinks for no, patient was able deny pain or discomfort at this time.  We discussed goals of care though discussion was limited to yes/no answers. We reviewed keeping patient comfortable and minimizing her anxiety and agitation.  Patient blinked once for yes.   I outlined that family plans to arrive today to further discuss next steps with her. She smiled.   As per chart review and discussion with RN, plan remains for family to arrive bedside to discuss comfort measures.  PMT will remain available to patient and family when they arrive and/or at their request.  Physical Exam Vitals reviewed.  Constitutional:      General: She is not in acute distress.    Appearance: She is ill-appearing.  HENT:     Head: Normocephalic.     Comments: Temporal wasting, frail, thin    Mouth/Throat:     Mouth: Mucous membranes are moist.  Eyes:     Pupils: Pupils are equal, round, and reactive to light.  Pulmonary:     Comments: Trach with full vent support Abdominal:     Palpations: Abdomen is soft.  Skin:    Coloration: Skin is pale.  Psychiatric:        Behavior: Behavior normal.             Total Time 25 minutes   Vanessa Mcguiness L. Manon Hilding, FNP-BC Palliative Medicine Team Team Phone # [phone]

## 2023-04-16 NOTE — Progress Notes (Signed)
   04/16/23 1600  Spiritual Encounters  Type of Visit Initial  Care provided to: Pt and family  Referral source Chaplain team  Reason for visit Urgent spiritual support  OnCall Visit No  Spiritual Framework  Presenting Themes Meaning/purpose/sources of inspiration;Significant life change  Patient Stress Factors Major life changes  Family Stress Factors Major life changes  Interventions  Spiritual Care Interventions Made Established relationship of care and support;Compassionate presence;Prayer  Spiritual Care Plan  Spiritual Care Issues Still Outstanding Referring to oncoming chaplain for further support   Prayer for patient and family. Patient is moving to comfort care. Son and patient niece was end the room during the prayer. Advise them that a chaplain is here 24/7 if they needed [place] for Pastoral Care. Will let oncall chaplain know about situation.

## 2023-04-16 NOTE — Consult Note (Signed)
PHARMACY CONSULT NOTE  Pharmacy Consult for Electrolyte Monitoring and Replacement   Recent Labs: Potassium (mmol/L)  Date Value  04/16/2023 3.4 (L)  01/03/2014 3.6   Magnesium (mg/dL)  Date Value  16/09/9603 1.7  01/03/2014 1.9   Calcium (mg/dL)  Date Value  54/08/8118 7.2 (L)   Calcium, Total (mg/dL)  Date Value  14/78/2956 7.4 (L)   Albumin (g/dL)  Date Value  21/30/8657 1.9 (L)  07/31/2022 2.9 (L)  01/01/2014 3.2 (L)   Phosphorus (mg/dL)  Date Value  84/69/6295 3.6   Sodium (mmol/L)  Date Value  04/16/2023 141  07/31/2022 137  01/03/2014 139    Assessment: 72 y.o female w/ significant PMH of COPD, Emphysema, GERD, RA, Fibromyalgia, tobacco abuse, h/o drug abuse (cocaine). EtOH Abuse, Anxiety and Depression and recent diagnosis of chest wall mass concerning for Lymphadenopathy who presented to the ED with chief complaints of acute respiratory distress. Patient currently intubated and transferred to ICU. Pharmacy has been consulted to manage and replace electrolytes while under PCCM care.  Diet/Nutrition: Vital 1.5 at 40 mL/hr. Free water 30 ml q4H.   Goal of Therapy:  Electrolytes within normal limits  Plan:  --K 3.4, Kcl 40 mEq per tube x 1 --Mg 1.7, magnesium sulfate 2 g IV x 1 --Follow-up electrolytes with AM labs tomorrow  Tressie Ellis 04/16/2023 8:03 AM

## 2023-04-16 NOTE — Progress Notes (Signed)
SLP Cancellation Note  Patient Details Name: Vanessa Oneal MRN: [id number] DOB: [birth year]   Cancelled treatment:       Reason Eval/Treat Not Completed: Medical issues which prohibited therapy;Patient's level of consciousness;Patient not medically ready (chart reviewed; consulted NSG re: pt's status today.)   Per chart notes/Team report, pt remains mildy aggitated despite versed and precedex drip infusing, She required return to FULL vent support d/t decline in respiratory status again. Per MD note, plan is for family meeting and potentially de-escalation of therapy this afternoon. ST services will sign off at this time as pt's Pulmonary status is not improving sufficiently for consideration for trach collar wean and PMV use. MD to reconsult when pt's status has improved for such. NP and NSG agreed.       Jerilynn Som, MS, CCC-SLP Speech Language Pathologist Rehab Services; [facility] [phone] (ascom) Fredis Malkiewicz 04/16/2023, 1:27 PM

## 2023-04-17 DIAGNOSIS — J441 Chronic obstructive pulmonary disease with (acute) exacerbation: Secondary | ICD-10-CM | POA: Diagnosis not present

## 2023-04-17 DIAGNOSIS — J9621 Acute and chronic respiratory failure with hypoxia: Secondary | ICD-10-CM | POA: Diagnosis not present

## 2023-04-17 DIAGNOSIS — J189 Pneumonia, unspecified organism: Secondary | ICD-10-CM | POA: Diagnosis not present

## 2023-04-17 DIAGNOSIS — J9601 Acute respiratory failure with hypoxia: Secondary | ICD-10-CM | POA: Diagnosis not present

## 2023-04-17 LAB — GLUCOSE, CAPILLARY
Glucose-Capillary: 108 mg/dL — ABNORMAL HIGH (ref 70–99)
Glucose-Capillary: 111 mg/dL — ABNORMAL HIGH (ref 70–99)
Glucose-Capillary: 118 mg/dL — ABNORMAL HIGH (ref 70–99)
Glucose-Capillary: 121 mg/dL — ABNORMAL HIGH (ref 70–99)
Glucose-Capillary: 125 mg/dL — ABNORMAL HIGH (ref 70–99)
Glucose-Capillary: 126 mg/dL — ABNORMAL HIGH (ref 70–99)
Glucose-Capillary: 128 mg/dL — ABNORMAL HIGH (ref 70–99)
Glucose-Capillary: 153 mg/dL — ABNORMAL HIGH (ref 70–99)

## 2023-04-17 LAB — CBC
HCT: 24.9 % — ABNORMAL LOW (ref 36.0–46.0)
Hemoglobin: 8 g/dL — ABNORMAL LOW (ref 12.0–15.0)
MCH: 30.5 pg (ref 26.0–34.0)
MCHC: 32.1 g/dL (ref 30.0–36.0)
MCV: 95 fL (ref 80.0–100.0)
Platelets: 268 10*3/uL (ref 150–400)
RBC: 2.62 MIL/uL — ABNORMAL LOW (ref 3.87–5.11)
RDW: 13.7 % (ref 11.5–15.5)
WBC: 6.5 10*3/uL (ref 4.0–10.5)
nRBC: 0 % (ref 0.0–0.2)

## 2023-04-17 LAB — RENAL FUNCTION PANEL
Albumin: 2 g/dL — ABNORMAL LOW (ref 3.5–5.0)
Anion gap: 7 (ref 5–15)
BUN: 18 mg/dL (ref 8–23)
CO2: 27 mmol/L (ref 22–32)
Calcium: 8.4 mg/dL — ABNORMAL LOW (ref 8.9–10.3)
Chloride: 98 mmol/L (ref 98–111)
Creatinine, Ser: 0.43 mg/dL — ABNORMAL LOW (ref 0.44–1.00)
GFR, Estimated: >60 mL/min (ref >60)
Glucose, Bld: 165 mg/dL — ABNORMAL HIGH (ref 70–99)
Phosphorus: 4.7 mg/dL — ABNORMAL HIGH (ref 2.5–4.6)
Potassium: 4.1 mmol/L (ref 3.5–5.1)
Sodium: 132 mmol/L — ABNORMAL LOW (ref 135–145)

## 2023-04-17 LAB — MAGNESIUM: Magnesium: 1.8 mg/dL (ref 1.7–2.4)

## 2023-04-17 MED ORDER — ENOXAPARIN SODIUM 30 MG/0.3ML IJ SOSY
30.0000 mg | PREFILLED_SYRINGE | INTRAMUSCULAR | Status: DC
  Administered 2023-04-17 – 2023-04-19 (×3): 30 mg via SUBCUTANEOUS
  Filled 2023-04-17 (×3): qty 0.3

## 2023-04-17 MED ORDER — POLYETHYLENE GLYCOL 3350 17 G PO PACK: 17.0000 g | PACK | Freq: Every day | ORAL | Status: DC | PRN

## 2023-04-17 MED ORDER — JUVEN PO PACK
1.0000 | PACK | Freq: Two times a day (BID) | ORAL | Status: DC
  Administered 2023-04-18 – 2023-04-20 (×6): 1

## 2023-04-17 MED ORDER — DOCUSATE SODIUM 50 MG/5ML PO LIQD: 100.0000 mg | Freq: Two times a day (BID) | ORAL | Status: DC | PRN

## 2023-04-17 NOTE — Progress Notes (Signed)
NAME:  Vanessa Oneal, MRN:  [id number], DOB:  02-16-1951, LOS: 17 ADMISSION DATE:  03/31/2023, CONSULTATION DATE:  03/31/2023 REFERRING MD:  ED Provider, CHIEF COMPLAINT:  Respiratory Failure   Brief Pt Description / Synopsis:  Patient is a 72 year old female with a past medical history of COPD/emphysema presenting to the hospital with acute on chronic hypoxic and hypercapnic respiratory due to Acute COPD Exacerbation requiring intubation and mechanical ventilation. Course has been complicated by failure to wean off the ventilator secondary to extremely elevated sedation requirements.   History of Present Illness:  72 y.o female w/ significant PMH of COPD, Emphysema, GERD, RA, Fibromyalgia, tobacco abuse, h/o drug abuse (cocaine). EtOH Abuse, Anxiety and Depression and recent diagnosis of chest wall mass concerning for Lymphadenopathy who presented to the ED with chief complaints of acute respiratory distress.   On review of chart, patient recently presented to [facility] emergency room on 03/27/2023 with complaints of chest pain.  CTA chest and CT abdomen pelvis with contrast was obtained which showed extensive mediastinal soft tissue mass and a large retroperitoneal soft tissue mass favored to represent lymphoma. Patient was referred to outpatient oncology for further evaluation.  Patient was evaluated by oncologist Dr. Cathie Hoops on 03/29/2023 who recommended a PET scan and ultrasound-guided biopsy of the right supraclavicular lymphadenopathy for further evaluation.  She also had myeloma panel and peripheral blood flow cytometric and LDH obtained.  Prior to presenting to the ED, patient had been complaining of progressive shortness of breath  all day and was found with oxygen saturation of 66% on room air.  She was placed on a nonrebreather and given Solu-Medrol 125 and 2 albuterol DuoNebs prior to transporting to the ED for further evaluation.   ED Course: Initial vital signs showed HR of 77 beats/minute, BP  90/60 mm Hg, the RR 22 breaths/minute, and the oxygen saturation 78% on NRB and a temperature of 98.25F (36.8C).  He was noted to be in acute respiratory distress with increased work of breathing.  Due to concerns for impending respiratory arrest, patient was intubated for airway protection.   04/16/23- patient remains mildy aggitated this am despite versed and precedex drip infusing, she is receiving OG feeds.  Plan for family meeting and potentially de-escalation of therapy this afternoon.   04/17/23- family came in yesterday but did not agree to comfort measures.  They wish for patient to remain on sedation via IV infusion for now so they can meet with her.  Data shows electrolyte derangements with severe protein calorie malnutrition and chronic anemia.  She does shows signs of severe discomfort on artifical life support with mechanical ventilation but with infusion of anlagosedation she seems to have less suffering.  I anticipate in hospital death due to very poor prognosis with multiple comorbidities.      Pertinent  Medical History   Past Medical History:  Diagnosis Date   Arthritis    RA   Depression    Fibromyalgia    GERD (gastroesophageal reflux disease)    H/O drug dependence (HCC)    opiods   Wears dentures    full upper    Micro Data:  4/27: COVID-19/RSV/Flu PCR>>negative 4/27: Respiratory viral panel>>negative 4/27: Strep pneumo urinary antigen>>negative 4/27: Legionella urinary antigen>>negative 4/27: Blood culture x2>>negative 4/27: MRSA PCR>>negative 4/27: BAL>> no growth  Antimicrobials:   Anti-infectives (From admission, onward)    Start     Dose/Rate Route Frequency Ordered Stop   04/15/23 1000  Ampicillin-Sulbactam (UNASYN) 3 g in sodium  chloride 0.9 % 100 mL IVPB        3 g 200 mL/hr over 30 Minutes Intravenous Every 6 hours 04/15/23 0826  0959   04/13/23 0345  Ampicillin-Sulbactam (UNASYN) 3 g in sodium chloride 0.9 % 100 mL IVPB  Status:   Discontinued        3 g 200 mL/hr over 30 Minutes Intravenous Every 6 hours 04/13/23 0248 04/15/23 0826   04/01/23 0500  vancomycin (VANCOREADY) IVPB 500 mg/100 mL  Status:  Discontinued        500 mg 100 mL/hr over 60 Minutes Intravenous Every 24 hours 03/31/23 0643 04/01/23 1021   03/31/23 1800  ceFEPIme (MAXIPIME) 2 g in sodium chloride 0.9 % 100 mL IVPB  Status:  Discontinued        2 g 200 mL/hr over 30 Minutes Intravenous Every 12 hours 03/31/23 0643 04/04/23 1057   03/31/23 0100  vancomycin (VANCOCIN) IVPB 1000 mg/200 mL premix        1,000 mg 200 mL/hr over 60 Minutes Intravenous  Once 03/31/23 0048 03/31/23 0220   03/31/23 0100  ceFEPIme (MAXIPIME) 2 g in sodium chloride 0.9 % 100 mL IVPB        2 g 200 mL/hr over 30 Minutes Intravenous  Once 03/31/23 0048 03/31/23 0201        Significant Hospital Events: Including procedures, antibiotic start and stop dates in addition to other pertinent events   4/27: Chest Xray>> Endotracheal tube tip about 3.5 cm superior to carina. Esophageal tube tip below the diaphragm but incompletely visualized. Diffuse bilateral interstitial and hazy lung opacity, either representing edema or diffuse lung infection. More focal nodular opacity in the right mid lung may be infectious/ inflammatory or neoplastic in etiology. 4/27: CTA Chest/Abdomen/Pelvis>>Moderate left and small right pleural effusions. Emphysema. Diffuse bilateral septal thickening suggests underlying edema. Heterogeneous bilateral airspace consolidations throughout the lungs suspicious for multifocal pneumonia. Extensive cystic/low-density right supraclavicular, mediastinal, hilar, upper abdominal and retroperitoneal adenopathy. Differential considerations include necrotic metastatic disease, infectious etiology such as tuberculous or nontuberculous mycobacterial infection, and lymphoproliferative disease. Endotracheal tube tip about 1.2 cm superior to carina. Esophageal tube tip just  beyond GE junction, suggest further advancement for more optimal positioning, there is moderate fluid distension of the stomach Small volume abdominopelvic ascites. Aortic Atherosclerosis 4/28: Pt remains mechanically intubated vent settings: FiO2 50%/PEEP 5.  Pt with severe anxiety on precedex and fentanyl gtts will start antianxiety medications per tubes  4/29: severe COPD, alert, unable to wean from vent 5/1: s/p PEG and LN biopsy 5/2: remains on vent, severe end stage COPD, failure to wean from vent 5/3: remains on vent, severe COPD, failure to wean from vent 5/4: failure to wean from vent 5/6: Pt remains mechanically intubated heavily sedated with propofol/versed/fentanyl gtts due to severe anxiety/agitation pending trach placement 05/7 5/7: Pt underwent tracheostomy placement size #6 shiley per ENT.  Will start weaning sedatives  5/9: Patient awake, follows commands, on trach collar  5/10: Overnight pt severely agitated with increased WOB.  Required Propofol for a time, sedated this morning.  CXR with worsening of bilateral airspace opacities~ gently diuresed and placed on empiric Unasyn. 5/11: Pt weaned off versed gtt and sedated with precedex gtt @1 .2 mcg/kg/hr tolerating PS 10/5  Interim History / Subjective:  As outlined above under significant events   Objective   Blood pressure 107/73, pulse 86, temperature 98.3 F (36.8 C), resp. rate (!) 25, height 5\' 1"  (1.549 m), weight 42.5 kg, SpO2 99 %.  Vent Mode: PRVC FiO2 (%):  [35 %] 35 % Set Rate:  [18 bmp] 18 bmp Vt Set:  [380 mL] 380 mL PEEP:  [5 cmH20] 5 cmH20 Plateau Pressure:  [17 cmH20-26 cmH20] 18 cmH20   Intake/Output Summary (Last 24 hours) at 04/17/2023 0845 Last data filed at 04/17/2023 0744 Gross per 24 hour  Intake 1945.5 ml  Output 2300 ml  Net -354.5 ml    Filed Weights   04/15/23 0357 04/16/23 0425 04/17/23 0500  Weight: 42.5 kg 41.7 kg 42.5 kg    Examination: General: Acute on chronically ill  appearing cachectic female, NAD mechanically ventilated via tracheostomy  HENT: Atraumatic, normocephalic, neck supple, no JVD, size #6 shiley tracheostomy midline clean/intact  Lungs: Diffuse rhonchi throughout, even, non labored  Cardiovascular: RRR, s1s2, no M/R/G, 2+ radial/1+ distal pulses, no edema  Abdomen: +BS x4, soft, mildly distended, PEG clean dry and intact Extremities: No deformities, no edema Neuro: Lightly sedated, following commands, PERRL GU: Foley catheter in place draining yellow urine  Resolved Hospital Problem list     Assessment & Plan:   #Acute on Chronic Hypoxic & Hypercapnic Respiratory Failure due to AECOPD Presented with respiratory failure secondary to pneumonia and COPD exacerbation. Treated with antibiotics, steroids, and nebulizer therapy. She's failed to wean off the ventilator, and previously refused extubation during goals of care discussions. She is s/p tracheostomy,  - Full vent support, implement lung protective strategies - Plateau pressures less than 30 cm H20 - Wean FiO2 & PEEP as tolerated to maintain O2 sats 88 to 92% - Follow intermittent Chest X-ray & ABG as needed - Spontaneous Breathing Trials / Trach collar trials as tolerated - Implement VAP Bundle - Bronchodilators - ABX as above - Diuresis as BP and renal function permits   #HFrEF (Systolic and Diastolic dysfunction) #Demand ischemia TTE from 04/01/2023 with systolic and diastolic dysfunction. Troponin elevated on presentation (peak 871), attributed to demand. Hemodynamically stable. Continue to monitor. - Continuous cardiac monitoring - Maintain MAP >65 - Vasopressors as needed to maintain MAP goal ~ not requirijng - BNP was 634 - Diuresis as BP and renal function permits: 20 mg iv lasix x1 dose 05/12   #Hypomagnesia  - Trend BMP  - Replace electrolytes as indicated  - Monitor UOP  #Metastatic Malignancy #Extensive Stage Small Cell Lung Cancer (neuroendocrine) She had a  lymph node biopsies secondary to concern for malignancy (with result returning positive for metastatic malignancy). Neuroendocrine/small cell lung cancer, extensive stage. Patient is not a current candidate for any chemotherapy, and oncology will hold off on evaluating her at the moment, thou she could benefit from being evaluated by them in the future.  #New leukocytosis concerning for possible aspiration pneumonia~improving   - Trend WBC and monitor fever curve  - Continue empiric Unasyn stop date   #Anxiety #Analgo-sedation She is now requiring dexmedetomidine and midazolam infusions for sedation, which we are attempting to wean off by increasing the dose of parenteral medications. -Maintain a RASS goal of 0 - Precedex gtt will attempt to wean versed gtt off first  - Continue scheduled valium, oxycodone, and seroquel  - Follow Qtc    #Gastrointestinal - Continue tube feeds. H2B for SUP - KUB pending due to mild abdominal distension    #Renal Kidney function at baseline. Continue to monitor and replete electrolytes as needed   #Endocrine - Continue SSI  - CBG's q4hrs      Pt with severe COPD, Metastatic Malignancy/Extensive Stage Small Cell Lung Cancer (neuroendocrine),  overall long term prognosis is extremely poor.  Recommend DNR status.   Best Practice (right click and "Reselect all SmartList Selections" daily)   Diet/type: tubefeeds and NPO DVT prophylaxis: prophylactic heparin  GI prophylaxis: H2B Lines: Right upper extremity PICC line and still needed  Foley:  Yes, and it is still needed (urinary retention) Code Status:  full code Last date of multidisciplinary goals of care discussion [04/14/2023]  5/11: Will update pt's family when they arrive at bedside. Labs   CBC: Recent Labs  Lab 04/13/23 0445 04/14/23 0356 04/15/23 0402 04/16/23 0419 04/17/23 0554  WBC 12.2* 8.5 6.5 5.6 6.5  HGB 8.9* 7.9* 7.4* 7.3* 8.0*  HCT 27.0* 24.4* 22.8* 22.1* 24.9*  MCV  94.4 95.7 95.4 94.0 95.0  PLT 297 246 219 221 268     Basic Metabolic Panel: Recent Labs  Lab 04/13/23 0445 04/14/23 0356 04/15/23 0402 04/16/23 0419 04/17/23 0554  NA 134* 134* 140 141 132*  K 4.2 4.1 3.2* 3.4* 4.1  CL 99 101 101 98 98  CO2 27 27 25 25 27   GLUCOSE 116* 156* 185* 132* 165*  BUN 17 17 17 15 18   CREATININE 0.37* 0.39* 0.58 0.59 0.43*  CALCIUM 8.4* 8.0* 7.2* 7.2* 8.4*  MG 2.0 1.6* 1.6* 1.7 1.8  PHOS 4.0 3.7 4.2 3.6 4.7*    GFR: Estimated Creatinine Clearance: 43.3 mL/min (A) (by C-G formula based on SCr of 0.43 mg/dL (L)). Recent Labs  Lab 04/12/23 2219 04/13/23 0445 04/14/23 0356 04/15/23 0402 04/16/23 0419 04/17/23 0554  PROCALCITON <0.10 0.18 0.12  --   --   --   WBC  --  12.2* 8.5 6.5 5.6 6.5     Liver Function Tests: Recent Labs  Lab 04/13/23 0445 04/14/23 0356 04/15/23 0402 04/16/23 0419 04/17/23 0554  ALBUMIN 2.3* 2.0* 2.0* 1.9* 2.0*    No results for input(s): "LIPASE", "AMYLASE" in the last 168 hours. No results for input(s): "AMMONIA" in the last 168 hours.  ABG    Component Value Date/Time   PHART 7.5 (H) 04/13/2023 0242   PCO2ART 38 04/13/2023 0242   PO2ART 79 (L) 04/13/2023 0242   HCO3 29.6 (H) 04/13/2023 0242   O2SAT 97 04/13/2023 0242     Coagulation Profile: No results for input(s): "INR", "PROTIME" in the last 168 hours.   Cardiac Enzymes: No results for input(s): "CKTOTAL", "CKMB", "CKMBINDEX", "TROPONINI" in the last 168 hours.  HbA1C: HB A1C (BAYER DCA - WAIVED)  Date/Time Value Ref Range Status  01/05/2020 03:31 PM 4.8 <7.0 % Final    Comment:                                          Diabetic Adult            <7.0                                       Healthy Adult        4.3 - 5.7                                                           (  DCCT/NGSP) American Diabetes Association's Summary of Glycemic Recommendations for Adults with Diabetes: Hemoglobin A1c <7.0%. More stringent glycemic goals (A1c  <6.0%) may further reduce complications at the cost of increased risk of hypoglycemia.   11/19/2017 01:41 PM 4.9 <7.0 % Final    Comment:                                          Diabetic Adult            <7.0                                       Healthy Adult        4.3 - 5.7                                                           (DCCT/NGSP) American Diabetes Association's Summary of Glycemic Recommendations for Adults with Diabetes: Hemoglobin A1c <7.0%. More stringent glycemic goals (A1c <6.0%) may further reduce complications at the cost of increased risk of hypoglycemia.    Hgb A1c MFr Bld  Date/Time Value Ref Range Status  04/01/2023 03:59 AM 6.2 (H) 4.8 - 5.6 % Final    Comment:    (NOTE) Pre diabetes:          5.7%-6.4%  Diabetes:              >6.4%  Glycemic control for   <7.0% adults with diabetes   07/18/2022 01:12 AM 5.1 4.8 - 5.6 % Final    Comment:    (NOTE) Pre diabetes:          5.7%-6.4%  Diabetes:              >6.4%  Glycemic control for   <7.0% adults with diabetes     CBG: Recent Labs  Lab 04/16/23 1911 04/17/23 0044 04/17/23 0311 04/17/23 0403 04/17/23 0722  GLUCAP 155* 126* 118* 108* 153*     Review of Systems:   Unable to assess due to sedation  Past Medical History:  She,  has a past medical history of Arthritis, Depression, Fibromyalgia, GERD (gastroesophageal reflux disease), H/O drug dependence (HCC), and Wears dentures.   Surgical History:   Past Surgical History:  Procedure Laterality Date   BREAST SURGERY     Silicone implants, then removal   CATARACT EXTRACTION W/PHACO Left 05/06/2019   Procedure: CATARACT EXTRACTION PHACO AND INTRAOCULAR LENS PLACEMENT (IOC) LEFT;  Surgeon: Galen Manila, MD;  Location: [facility];  Service: Ophthalmology;  Laterality: Left;   IR GASTROSTOMY TUBE MOD SED  04/04/2023   IR US LIVER BIOPSY  04/04/2023   TRACHEOSTOMY TUBE PLACEMENT N/A 04/10/2023   Procedure: TRACHEOSTOMY;   Surgeon: Linus Salmons, MD;  Location: [facility];  Service: ENT;  Laterality: N/A;   TUBAL LIGATION       Social History:   reports that she has been smoking cigarettes. She has a 14.00 pack-year smoking history. She has never used smokeless tobacco. She reports that she does not currently use alcohol. She reports that she does not use drugs.   Family History:  Her family  history includes COPD in her sister; Suicidality in her father.   Allergies Allergies  Allergen Reactions   Gabapentin Anaphylaxis     Home Medications  Prior to Admission medications   Medication Sig Start Date End Date Taking? Authorizing Provider  naloxone [facility]) nasal spray 4 mg/0.1 mL 1 spray into the nostril 1x, may repeat dose in alternate nostrils every 2-3 minutes until EMS arrives 03/29/23  Yes Johnson, Megan P, DO  promethazine (PHENERGAN) 25 MG tablet TAKE 1/2 TABLET BY MOUTH EVERY 8 HOURS AS NEEDED FOR NAUSEA AND VOMITING 07/31/22  Yes Johnson, Megan P, DO  albuterol (VENTOLIN HFA) 108 (90 Base) MCG/ACT inhaler Inhale 2 puffs into the lungs every 6 (six) hours as needed for wheezing or shortness of breath. Patient not taking: Reported on 03/29/2023 07/23/22   Kathrynn Running, MD  clonazePAM (KLONOPIN) 0.5 MG tablet Take by mouth.    [provider]  Fluticasone-Umeclidin-Vilant (TRELEGY ELLIPTA) 100-62.5-25 MCG/ACT AEPB Inhale 1 Inhalation into the lungs daily in the afternoon. Patient not taking: Reported on 03/29/2023 07/31/22   Dorcas Carrow, DO     Critical care provider statement:   Total critical care time: 33 minutes   Performed by: Karna Christmas MD   Critical care time was exclusive of separately billable procedures and treating other patients.   Critical care was necessary to treat or prevent imminent or life-threatening deterioration.   Critical care was time spent personally by me on the following activities: development of treatment plan with patient and/or surrogate as well as  nursing, discussions with consultants, evaluation of patient's response to treatment, examination of patient, obtaining history from patient or surrogate, ordering and performing treatments and interventions, ordering and review of laboratory studies, ordering and review of radiographic studies, pulse oximetry and re-evaluation of patient's condition.    Vida Rigger, M.D.  Pulmonary & Critical Care Medicine

## 2023-04-17 NOTE — Progress Notes (Addendum)
Nutrition Follow Up Note   DOCUMENTATION CODES:   Severe malnutrition in context of chronic illness  INTERVENTION:   Vital 1.5@40ml /hr continuous   Free water flushes 30ml q4 hours to maintain tube patency   Regimen provides 1440kcal/day, 65g/day protein and 966ml/day of free water   Liquid MVI daily via tube   Add Juven Fruit Punch BID via tube, each serving provides 95kcal and 2.5g of protein (amino acids glutamine and arginine)  NUTRITION DIAGNOSIS:   Severe Malnutrition related to chronic illness (COPD, new cancer) as evidenced by severe fat depletion, severe muscle depletion. -ongoing   GOAL:   Provide needs based on ASPEN/SCCM guidelines -met   MONITOR:   Vent status, Labs, Weight trends, TF tolerance, I & O's, Skin  ASSESSMENT:   72 y.o female w/ significant PMH of COPD, Emphysema, GERD, RA, Fibromyalgia, tobacco abuse, h/o drug abuse (cocaine, opiate), EtOH Abuse, hep C, CAD, anxiety, depression and recent diagnosis of chest wall mass concerning for lymphadenopathy who is admitted with new CHF, COPD exacerbation, PNA, sepsis and shock.  -Pt s/p tracheostomy 5/7 -Pt s/p IR G-tube 5/1 (20 [place])   Pt remains sedated and ventilated via trach. Pt continues to tolerate tube feeds at goal rate. Pt having liquid BMs; bowel regimen adjusted. Pt noted to have some MASD; will add juven. Per chart, pt is up ~14lbs from her UBW. Family deciding about GOC; palliative care following.   Medications reviewed and include: colace, lovenox, pepcid, insulin, nicotine, MVI, oxycodone, miralax, unasyn  Labs reviewed: Na 132(L), K 4.1 wnl, creat 0.43(L), P 4.7(H), Mg 1.8 wnl Hgb 8.0(L), Hct 24.9(L) Cbgs- 121, 153, 108, 118, 126 x 24 hrs   Patient is currently intubated on ventilator support MV: 11.9 L/min Temp (24hrs), Avg:98.6 F (37 C), Min:98.3 F (36.8 C), Max:99.1 F (37.3 C)  Propofol: none   MAP- >37mmHg   UOP- 2329ml/day   Diet Order:   Diet Order              Diet NPO time specified  Diet effective midnight                  EDUCATION NEEDS:   No education needs have been identified at this time  Skin:  Skin Assessment: Reviewed RN Assessment (ecchymosis, incision neck)  Last BM:  5/14- type 7  Height:   Ht Readings from Last 1 Encounters:  03/31/23 5\' 1"  (1.549 m)    Weight:   Wt Readings from Last 1 Encounters:  04/17/23 42.5 kg    Ideal Body Weight:  47.7 kg  BMI:  Body mass index is 17.7 kg/m.  Estimated Nutritional Needs:   Kcal:  1200-1400kcal/day  Protein:  65-75g/day  Fluid:  1.0-1.2L/day  Betsey Holiday MS, RD, LDN Please refer to [facility] for RD and/or RD on-call/weekend/after hours pager

## 2023-04-17 NOTE — Consult Note (Signed)
PHARMACY CONSULT NOTE  Pharmacy Consult for Electrolyte Monitoring and Replacement   Recent Labs: Potassium (mmol/L)  Date Value  04/17/2023 4.1  01/03/2014 3.6   Magnesium (mg/dL)  Date Value  16/09/9603 1.8  01/03/2014 1.9   Calcium (mg/dL)  Date Value  54/08/8118 8.4 (L)   Calcium, Total (mg/dL)  Date Value  14/78/2956 7.4 (L)   Albumin (g/dL)  Date Value  21/30/8657 2.0 (L)  07/31/2022 2.9 (L)  01/01/2014 3.2 (L)   Phosphorus (mg/dL)  Date Value  84/69/6295 4.7 (H)   Sodium (mmol/L)  Date Value  04/17/2023 132 (L)  07/31/2022 137  01/03/2014 139    Assessment: 72 y.o female w/ significant PMH of COPD, Emphysema, GERD, RA, Fibromyalgia, tobacco abuse, h/o drug abuse (cocaine). EtOH Abuse, Anxiety and Depression and recent diagnosis of chest wall mass concerning for Lymphadenopathy who presented to the ED with chief complaints of acute respiratory distress. Patient currently intubated and transferred to ICU. Pharmacy has been consulted to manage and replace electrolytes while under PCCM care.  Diet/Nutrition: Vital 1.5 at 40 mL/hr. Free water 30 ml q4H.   Goal of Therapy:  Electrolytes within normal limits  Plan:  --No electrolyte replacement indicated at this time --Follow-up electrolytes with AM labs tomorrow  Tressie Ellis 04/17/2023 8:26 AM

## 2023-04-18 ENCOUNTER — Inpatient Hospital Stay: Payer: 59 | Attending: Oncology

## 2023-04-18 DIAGNOSIS — J441 Chronic obstructive pulmonary disease with (acute) exacerbation: Secondary | ICD-10-CM | POA: Diagnosis not present

## 2023-04-18 DIAGNOSIS — J189 Pneumonia, unspecified organism: Secondary | ICD-10-CM | POA: Diagnosis not present

## 2023-04-18 DIAGNOSIS — J9621 Acute and chronic respiratory failure with hypoxia: Secondary | ICD-10-CM | POA: Diagnosis not present

## 2023-04-18 DIAGNOSIS — J9601 Acute respiratory failure with hypoxia: Secondary | ICD-10-CM | POA: Diagnosis not present

## 2023-04-18 LAB — GLUCOSE, CAPILLARY
Glucose-Capillary: 101 mg/dL — ABNORMAL HIGH (ref 70–99)
Glucose-Capillary: 110 mg/dL — ABNORMAL HIGH (ref 70–99)
Glucose-Capillary: 131 mg/dL — ABNORMAL HIGH (ref 70–99)
Glucose-Capillary: 137 mg/dL — ABNORMAL HIGH (ref 70–99)
Glucose-Capillary: 142 mg/dL — ABNORMAL HIGH (ref 70–99)
Glucose-Capillary: 143 mg/dL — ABNORMAL HIGH (ref 70–99)
Glucose-Capillary: 149 mg/dL — ABNORMAL HIGH (ref 70–99)

## 2023-04-18 LAB — RENAL FUNCTION PANEL
Albumin: 2.1 g/dL — ABNORMAL LOW (ref 3.5–5.0)
Anion gap: 8 (ref 5–15)
BUN: 17 mg/dL (ref 8–23)
CO2: 28 mmol/L (ref 22–32)
Calcium: 8.7 mg/dL — ABNORMAL LOW (ref 8.9–10.3)
Chloride: 96 mmol/L — ABNORMAL LOW (ref 98–111)
Creatinine, Ser: 0.39 mg/dL — ABNORMAL LOW (ref 0.44–1.00)
GFR, Estimated: >60 mL/min (ref >60)
Glucose, Bld: 167 mg/dL — ABNORMAL HIGH (ref 70–99)
Phosphorus: 4.2 mg/dL (ref 2.5–4.6)
Potassium: 4.1 mmol/L (ref 3.5–5.1)
Sodium: 132 mmol/L — ABNORMAL LOW (ref 135–145)

## 2023-04-18 LAB — CBC
HCT: 25.2 % — ABNORMAL LOW (ref 36.0–46.0)
Hemoglobin: 8.3 g/dL — ABNORMAL LOW (ref 12.0–15.0)
MCH: 31.4 pg (ref 26.0–34.0)
MCHC: 32.9 g/dL (ref 30.0–36.0)
MCV: 95.5 fL (ref 80.0–100.0)
Platelets: 274 10*3/uL (ref 150–400)
RBC: 2.64 MIL/uL — ABNORMAL LOW (ref 3.87–5.11)
RDW: 13.7 % (ref 11.5–15.5)
WBC: 6.1 10*3/uL (ref 4.0–10.5)
nRBC: 0 % (ref 0.0–0.2)

## 2023-04-18 LAB — MAGNESIUM: Magnesium: 1.7 mg/dL (ref 1.7–2.4)

## 2023-04-18 MED ORDER — HYDROMORPHONE HCL 1 MG/ML IJ SOLN
1.0000 mg | Freq: Once | INTRAMUSCULAR | Status: AC
  Administered 2023-04-18: 1 mg via INTRAVENOUS
  Filled 2023-04-18: qty 1

## 2023-04-18 MED ORDER — PROPOFOL 1000 MG/100ML IV EMUL
5.0000 ug/kg/min | INTRAVENOUS | Status: DC
  Administered 2023-04-18: 10 ug/kg/min via INTRAVENOUS
  Administered 2023-04-18 – 2023-04-19 (×2): 25 ug/kg/min via INTRAVENOUS
  Administered 2023-04-20: 20 ug/kg/min via INTRAVENOUS
  Filled 2023-04-18 (×4): qty 100

## 2023-04-18 NOTE — Progress Notes (Signed)
                                                     Palliative Care Progress Note   Patient Name: Vanessa Oneal       Date: 04/18/2023 DOB: 1951-01-10  Age: 72 y.o. MRN#: [id number] Attending Physician: Vida Rigger, MD Primary Care Physician: Dorcas Carrow, DO Admit Date: 03/31/2023  HPI: 72 y.o female w/ significant PMH of COPD, Emphysema, GERD, RA, Fibromyalgia, tobacco abuse, h/o drug abuse (cocaine). EtOH Abuse, Anxiety and Depression and recent diagnosis of chest wall mass concerning for Lymphadenopathy who presented to the ED with chief complaints of acute respiratory distress.   On review of chart, patient recently presented to [facility] emergency room on 03/27/2023 with complaints of chest pain.  CTA chest and CT abdomen pelvis with contrast was obtained which showed extensive mediastinal soft tissue mass and a large retroperitoneal soft tissue mass favored to represent lymphoma. Patient was referred to outpatient oncology for further evaluation.  Patient was evaluated by oncologist Dr. Cathie Hoops on 03/29/2023 who recommended a PET scan and ultrasound-guided biopsy of the right supraclavicular lymphadenopathy for further evaluation - never completed PTA.   Prior to presenting to the ED, patient had been complaining of progressive shortness of breath  all day and was found with oxygen saturation of 66% on room air.  She was placed on a nonrebreather and given Solu-Medrol 125 and 2 albuterol DuoNebs prior to transporting to the ED for further evaluation.   5/7 - trach placed   5/12 - GOC discussions with patient's sons and niece. Diagnosis of neuroendocrine/SCLC confirmed.    Palliative Medicine consulted for assisting with goals of care conversations.  Plan of care: Chart reviewed. Patient assessed. Trach and full vent support with sedation in  place.  Plan is to shift to comfort measures this afternoon once family arrives.   I attempted to speak with patient's two sons over the phone. No answer from either son. HIPAA compliant voicemail left for each.   Counseled with day shift RN Isaiah Serge and CCM NP Elvina Sidle. Sons plant o be bedside at 330pm this afternoon. PMT will be available to meet with family at this time.   Addendum:   RN notified this provider that family does not plan to bedside today. Continue with current plan of care and attempts will be made to meet with family bedside tomorrow.    Thank you for allowing the Palliative Medicine Team to assist in the care of Vanessa Oneal.  PE: Frail, thin, pale, temporal wasting Trach with vent in place  NAD noted Extremities warm to touch Nonverbal, unable to open eyes/blink as she did with me yesterday  Total Time 25 minutes  Greater than 50%  of this time was spent counseling and coordinating care related to the above assessment and plan.  Samara Deist L. Manon Hilding, FNP-BC Palliative Medicine Team Team Phone # [phone]

## 2023-04-18 NOTE — Progress Notes (Signed)
NAME:  JUANNA FEILEN, MRN:  [id number], DOB:  03/10/1951, LOS: 18 ADMISSION DATE:  03/31/2023, CONSULTATION DATE:  03/31/2023 REFERRING MD:  ED Provider, CHIEF COMPLAINT:  Respiratory Failure   Brief Pt Description / Synopsis:  Patient is a 72 year old female with a past medical history of COPD/emphysema presenting to the hospital with acute on chronic hypoxic and hypercapnic respiratory due to Acute COPD Exacerbation requiring intubation and mechanical ventilation. Course has been complicated by failure to wean off the ventilator secondary to extremely elevated sedation requirements.   History of Present Illness:  72 y.o female w/ significant PMH of COPD, Emphysema, GERD, RA, Fibromyalgia, tobacco abuse, h/o drug abuse (cocaine). EtOH Abuse, Anxiety and Depression and recent diagnosis of chest wall mass concerning for Lymphadenopathy who presented to the ED with chief complaints of acute respiratory distress.   On review of chart, patient recently presented to [facility] emergency room on 03/27/2023 with complaints of chest pain.  CTA chest and CT abdomen pelvis with contrast was obtained which showed extensive mediastinal soft tissue mass and a large retroperitoneal soft tissue mass favored to represent lymphoma. Patient was referred to outpatient oncology for further evaluation.  Patient was evaluated by oncologist Dr. Cathie Hoops on 03/29/2023 who recommended a PET scan and ultrasound-guided biopsy of the right supraclavicular lymphadenopathy for further evaluation.  She also had myeloma panel and peripheral blood flow cytometric and LDH obtained.  Prior to presenting to the ED, patient had been complaining of progressive shortness of breath  all day and was found with oxygen saturation of 66% on room air.  She was placed on a nonrebreather and given Solu-Medrol 125 and 2 albuterol DuoNebs prior to transporting to the ED for further evaluation.   ED Course: Initial vital signs showed HR of 77 beats/minute, BP  90/60 mm Hg, the RR 22 breaths/minute, and the oxygen saturation 78% on NRB and a temperature of 98.69F (36.8C).  He was noted to be in acute respiratory distress with increased work of breathing.  Due to concerns for impending respiratory arrest, patient was intubated for airway protection.   04/16/23- patient remains mildy aggitated this am despite versed and precedex drip infusing, she is receiving OG feeds.  Plan for family meeting and potentially de-escalation of therapy this afternoon.   04/17/23- family came in yesterday but did not agree to comfort measures.  They wish for patient to remain on sedation via IV infusion for now so they can meet with her.  Data shows electrolyte derangements with severe protein calorie malnutrition and chronic anemia.  She does shows signs of severe discomfort on artifical life support with mechanical ventilation but with infusion of anlagosedation she seems to have less suffering.  I anticipate in hospital death due to very poor prognosis with multiple comorbidities.   04/18/23 - patients family is here today for additional discussion regarding comfort measures.  Palliative care personnel is with [place] during comfort care discussion.      Pertinent  Medical History   Past Medical History:  Diagnosis Date   Arthritis    RA   Depression    Fibromyalgia    GERD (gastroesophageal reflux disease)    H/O drug dependence (HCC)    opiods   Wears dentures    full upper    Micro Data:  4/27: COVID-19/RSV/Flu PCR>>negative 4/27: Respiratory viral panel>>negative 4/27: Strep pneumo urinary antigen>>negative 4/27: Legionella urinary antigen>>negative 4/27: Blood culture x2>>negative 4/27: MRSA PCR>>negative 4/27: BAL>> no growth  Antimicrobials:   Anti-infectives (From  admission, onward)    Start     Dose/Rate Route Frequency Ordered Stop   04/15/23 1000  Ampicillin-Sulbactam (UNASYN) 3 g in sodium chloride 0.9 % 100 mL IVPB        3 g 200 mL/hr over 30  Minutes Intravenous Every 6 hours 04/15/23 0826 04/17/23 2134   04/13/23 0345  Ampicillin-Sulbactam (UNASYN) 3 g in sodium chloride 0.9 % 100 mL IVPB  Status:  Discontinued        3 g 200 mL/hr over 30 Minutes Intravenous Every 6 hours 04/13/23 0248 04/15/23 0826   04/01/23 0500  vancomycin (VANCOREADY) IVPB 500 mg/100 mL  Status:  Discontinued        500 mg 100 mL/hr over 60 Minutes Intravenous Every 24 hours 03/31/23 0643 04/01/23 1021   03/31/23 1800  ceFEPIme (MAXIPIME) 2 g in sodium chloride 0.9 % 100 mL IVPB  Status:  Discontinued        2 g 200 mL/hr over 30 Minutes Intravenous Every 12 hours 03/31/23 0643 04/04/23 1057   03/31/23 0100  vancomycin (VANCOCIN) IVPB 1000 mg/200 mL premix        1,000 mg 200 mL/hr over 60 Minutes Intravenous  Once 03/31/23 0048 03/31/23 0220   03/31/23 0100  ceFEPIme (MAXIPIME) 2 g in sodium chloride 0.9 % 100 mL IVPB        2 g 200 mL/hr over 30 Minutes Intravenous  Once 03/31/23 0048 03/31/23 0201        Significant Hospital Events: Including procedures, antibiotic start and stop dates in addition to other pertinent events   4/27: Chest Xray>> Endotracheal tube tip about 3.5 cm superior to carina. Esophageal tube tip below the diaphragm but incompletely visualized. Diffuse bilateral interstitial and hazy lung opacity, either representing edema or diffuse lung infection. More focal nodular opacity in the right mid lung may be infectious/ inflammatory or neoplastic in etiology. 4/27: CTA Chest/Abdomen/Pelvis>>Moderate left and small right pleural effusions. Emphysema. Diffuse bilateral septal thickening suggests underlying edema. Heterogeneous bilateral airspace consolidations throughout the lungs suspicious for multifocal pneumonia. Extensive cystic/low-density right supraclavicular, mediastinal, hilar, upper abdominal and retroperitoneal adenopathy. Differential considerations include necrotic metastatic disease, infectious etiology such as  tuberculous or nontuberculous mycobacterial infection, and lymphoproliferative disease. Endotracheal tube tip about 1.2 cm superior to carina. Esophageal tube tip just beyond GE junction, suggest further advancement for more optimal positioning, there is moderate fluid distension of the stomach Small volume abdominopelvic ascites. Aortic Atherosclerosis 4/28: Pt remains mechanically intubated vent settings: FiO2 50%/PEEP 5.  Pt with severe anxiety on precedex and fentanyl gtts will start antianxiety medications per tubes  4/29: severe COPD, alert, unable to wean from vent 5/1: s/p PEG and LN biopsy 5/2: remains on vent, severe end stage COPD, failure to wean from vent 5/3: remains on vent, severe COPD, failure to wean from vent 5/4: failure to wean from vent 5/6: Pt remains mechanically intubated heavily sedated with propofol/versed/fentanyl gtts due to severe anxiety/agitation pending trach placement 05/7 5/7: Pt underwent tracheostomy placement size #6 shiley per ENT.  Will start weaning sedatives  5/9: Patient awake, follows commands, on trach collar  5/10: Overnight pt severely agitated with increased WOB.  Required Propofol for a time, sedated this morning.  CXR with worsening of bilateral airspace opacities~ gently diuresed and placed on empiric Unasyn. 5/11: Pt weaned off versed gtt and sedated with precedex gtt @1 .2 mcg/kg/hr tolerating PS 10/5  Interim History / Subjective:  As outlined above under significant events   Objective  Blood pressure 102/67, pulse 78, temperature 99 F (37.2 C), temperature source Oral, resp. rate (!) 29, height 5\' 1"  (1.549 m), weight 40.9 kg, SpO2 99 %.    Vent Mode: PRVC FiO2 (%):  [35 %] 35 % Set Rate:  [18 bmp] 18 bmp Vt Set:  [350 mL-380 mL] 350 mL PEEP:  [5 cmH20] 5 cmH20 Plateau Pressure:  [15 cmH20-24 cmH20] 23 cmH20   Intake/Output Summary (Last 24 hours) at 04/18/2023 1610 Last data filed at 04/18/2023 0400 Gross per 24 hour  Intake  1346.29 ml  Output 1575 ml  Net -228.71 ml    Filed Weights   04/16/23 0425 04/17/23 0500 04/18/23 0300  Weight: 41.7 kg 42.5 kg 40.9 kg    Examination: General: Acute on chronically ill appearing cachectic female, NAD mechanically ventilated via tracheostomy  HENT: Atraumatic, normocephalic, neck supple, no JVD, size #6 shiley tracheostomy midline clean/intact  Lungs: Diffuse rhonchi throughout, even, non labored  Cardiovascular: RRR, s1s2, no M/R/G, 2+ radial/1+ distal pulses, no edema  Abdomen: +BS x4, soft, mildly distended, PEG clean dry and intact Extremities: No deformities, no edema Neuro: Lightly sedated, following commands, PERRL GU: Foley catheter in place draining yellow urine  Resolved Hospital Problem list     Assessment & Plan:   #Acute on Chronic Hypoxic & Hypercapnic Respiratory Failure due to AECOPD Presented with respiratory failure secondary to pneumonia and COPD exacerbation. Treated with antibiotics, steroids, and nebulizer therapy. She's failed to wean off the ventilator, and previously refused extubation during goals of care discussions. She is s/p tracheostomy,  - Full vent support, implement lung protective strategies - Plateau pressures less than 30 cm H20 - Wean FiO2 & PEEP as tolerated to maintain O2 sats 88 to 92% - Follow intermittent Chest X-ray & ABG as needed - Spontaneous Breathing Trials / Trach collar trials as tolerated - Implement VAP Bundle - Bronchodilators - ABX as above - Diuresis as BP and renal function permits   #HFrEF (Systolic and Diastolic dysfunction) #Demand ischemia TTE from 04/01/2023 with systolic and diastolic dysfunction. Troponin elevated on presentation (peak 871), attributed to demand. Hemodynamically stable. Continue to monitor. - Continuous cardiac monitoring - Maintain MAP >65 - Vasopressors as needed to maintain MAP goal ~ not requirijng - BNP was 634 - Diuresis as BP and renal function permits: 20 mg iv lasix  x1 dose 05/12   #Hypomagnesia  - Trend BMP  - Replace electrolytes as indicated  - Monitor UOP  #Metastatic Malignancy #Extensive Stage Small Cell Lung Cancer (neuroendocrine) She had a lymph node biopsies secondary to concern for malignancy (with result returning positive for metastatic malignancy). Neuroendocrine/small cell lung cancer, extensive stage. Patient is not a current candidate for any chemotherapy, and oncology will hold off on evaluating her at the moment, thou she could benefit from being evaluated by them in the future.  #New leukocytosis concerning for possible aspiration pneumonia~improving   - Trend WBC and monitor fever curve  - Continue empiric Unasyn stop date   #Anxiety #Analgo-sedation She is now requiring dexmedetomidine and midazolam infusions for sedation, which we are attempting to wean off by increasing the dose of parenteral medications. -Maintain a RASS goal of 0 - Precedex gtt will attempt to wean versed gtt off first  - Continue scheduled valium, oxycodone, and seroquel  - Follow Qtc    #Gastrointestinal - Continue tube feeds. H2B for SUP - KUB pending due to mild abdominal distension    #Renal Kidney function at baseline. Continue to monitor  and replete electrolytes as needed   #Endocrine - Continue SSI  - CBG's q4hrs      Pt with severe COPD, Metastatic Malignancy/Extensive Stage Small Cell Lung Cancer (neuroendocrine), overall long term prognosis is extremely poor.  Recommend DNR status.   Best Practice (right click and "Reselect all SmartList Selections" daily)   Diet/type: tubefeeds and NPO DVT prophylaxis: prophylactic heparin  GI prophylaxis: H2B Lines: Right upper extremity PICC line and still needed  Foley:  Yes, and it is still needed (urinary retention) Code Status:  full code Last date of multidisciplinary goals of care discussion [04/14/2023]  5/11: Will update pt's family when they arrive at bedside. Labs    CBC: Recent Labs  Lab 04/14/23 0356 04/15/23 0402 04/16/23 0419 04/17/23 0554 04/18/23 0350  WBC 8.5 6.5 5.6 6.5 6.1  HGB 7.9* 7.4* 7.3* 8.0* 8.3*  HCT 24.4* 22.8* 22.1* 24.9* 25.2*  MCV 95.7 95.4 94.0 95.0 95.5  PLT 246 219 221 268 274     Basic Metabolic Panel: Recent Labs  Lab 04/14/23 0356 04/15/23 0402 04/16/23 0419 04/17/23 0554 04/18/23 0350  NA 134* 140 141 132* 132*  K 4.1 3.2* 3.4* 4.1 4.1  CL 101 101 98 98 96*  CO2 27 25 25 27 28   GLUCOSE 156* 185* 132* 165* 167*  BUN 17 17 15 18 17   CREATININE 0.39* 0.58 0.59 0.43* 0.39*  CALCIUM 8.0* 7.2* 7.2* 8.4* 8.7*  MG 1.6* 1.6* 1.7 1.8 1.7  PHOS 3.7 4.2 3.6 4.7* 4.2    GFR: Estimated Creatinine Clearance: 41.6 mL/min (A) (by C-G formula based on SCr of 0.39 mg/dL (L)). Recent Labs  Lab 04/12/23 2219 04/13/23 0445 04/14/23 0356 04/15/23 0402 04/16/23 0419 04/17/23 0554 04/18/23 0350  PROCALCITON <0.10 0.18 0.12  --   --   --   --   WBC  --  12.2* 8.5 6.5 5.6 6.5 6.1     Liver Function Tests: Recent Labs  Lab 04/14/23 0356 04/15/23 0402 04/16/23 0419 04/17/23 0554 04/18/23 0350  ALBUMIN 2.0* 2.0* 1.9* 2.0* 2.1*    No results for input(s): "LIPASE", "AMYLASE" in the last 168 hours. No results for input(s): "AMMONIA" in the last 168 hours.  ABG    Component Value Date/Time   PHART 7.5 (H) 04/13/2023 0242   PCO2ART 38 04/13/2023 0242   PO2ART 79 (L) 04/13/2023 0242   HCO3 29.6 (H) 04/13/2023 0242   O2SAT 97 04/13/2023 0242     Coagulation Profile: No results for input(s): "INR", "PROTIME" in the last 168 hours.   Cardiac Enzymes: No results for input(s): "CKTOTAL", "CKMB", "CKMBINDEX", "TROPONINI" in the last 168 hours.  HbA1C: HB A1C (BAYER DCA - WAIVED)  Date/Time Value Ref Range Status  01/05/2020 03:31 PM 4.8 <7.0 % Final    Comment:                                          Diabetic Adult            <7.0                                       Healthy Adult        4.3 - 5.7                                                            (  DCCT/NGSP) American Diabetes Association's Summary of Glycemic Recommendations for Adults with Diabetes: Hemoglobin A1c <7.0%. More stringent glycemic goals (A1c <6.0%) may further reduce complications at the cost of increased risk of hypoglycemia.   11/19/2017 01:41 PM 4.9 <7.0 % Final    Comment:                                          Diabetic Adult            <7.0                                       Healthy Adult        4.3 - 5.7                                                           (DCCT/NGSP) American Diabetes Association's Summary of Glycemic Recommendations for Adults with Diabetes: Hemoglobin A1c <7.0%. More stringent glycemic goals (A1c <6.0%) may further reduce complications at the cost of increased risk of hypoglycemia.    Hgb A1c MFr Bld  Date/Time Value Ref Range Status  04/01/2023 03:59 AM 6.2 (H) 4.8 - 5.6 % Final    Comment:    (NOTE) Pre diabetes:          5.7%-6.4%  Diabetes:              >6.4%  Glycemic control for   <7.0% adults with diabetes   07/18/2022 01:12 AM 5.1 4.8 - 5.6 % Final    Comment:    (NOTE) Pre diabetes:          5.7%-6.4%  Diabetes:              >6.4%  Glycemic control for   <7.0% adults with diabetes     CBG: Recent Labs  Lab 04/17/23 1530 04/17/23 1923 04/17/23 2312 04/18/23 0320 04/18/23 0745  GLUCAP 128* 125* 111* 137* 110*     Review of Systems:   Unable to assess due to sedation  Past Medical History:  She,  has a past medical history of Arthritis, Depression, Fibromyalgia, GERD (gastroesophageal reflux disease), H/O drug dependence (HCC), and Wears dentures.   Surgical History:   Past Surgical History:  Procedure Laterality Date   BREAST SURGERY     Silicone implants, then removal   CATARACT EXTRACTION W/PHACO Left 05/06/2019   Procedure: CATARACT EXTRACTION PHACO AND INTRAOCULAR LENS PLACEMENT (IOC) LEFT;  Surgeon: Galen Manila, MD;   Location: [facility];  Service: Ophthalmology;  Laterality: Left;   IR GASTROSTOMY TUBE MOD SED  04/04/2023   IR US LIVER BIOPSY  04/04/2023   TRACHEOSTOMY TUBE PLACEMENT N/A 04/10/2023   Procedure: TRACHEOSTOMY;  Surgeon: Linus Salmons, MD;  Location: [facility];  Service: ENT;  Laterality: N/A;   TUBAL LIGATION       Social History:   reports that she has been smoking cigarettes. She has a 14.00 pack-year smoking history. She has never used smokeless tobacco. She reports that she does not currently use alcohol. She reports that she does not use drugs.   Family History:  Her family history  includes COPD in her sister; Suicidality in her father.   Allergies Allergies  Allergen Reactions   Gabapentin Anaphylaxis     Home Medications  Prior to Admission medications   Medication Sig Start Date End Date Taking? Authorizing Provider  naloxone [facility]) nasal spray 4 mg/0.1 mL 1 spray into the nostril 1x, may repeat dose in alternate nostrils every 2-3 minutes until EMS arrives 03/29/23  Yes Johnson, Megan P, DO  promethazine (PHENERGAN) 25 MG tablet TAKE 1/2 TABLET BY MOUTH EVERY 8 HOURS AS NEEDED FOR NAUSEA AND VOMITING 07/31/22  Yes Johnson, Megan P, DO  albuterol (VENTOLIN HFA) 108 (90 Base) MCG/ACT inhaler Inhale 2 puffs into the lungs every 6 (six) hours as needed for wheezing or shortness of breath. Patient not taking: Reported on 03/29/2023 07/23/22   Kathrynn Running, MD  clonazePAM (KLONOPIN) 0.5 MG tablet Take by mouth.    [provider]  Fluticasone-Umeclidin-Vilant (TRELEGY ELLIPTA) 100-62.5-25 MCG/ACT AEPB Inhale 1 Inhalation into the lungs daily in the afternoon. Patient not taking: Reported on 03/29/2023 07/31/22   Dorcas Carrow, DO     Critical care provider statement:   Total critical care time: 33 minutes   Performed by: Karna Christmas MD   Critical care time was exclusive of separately billable procedures and treating other patients.   Critical care was  necessary to treat or prevent imminent or life-threatening deterioration.   Critical care was time spent personally by me on the following activities: development of treatment plan with patient and/or surrogate as well as nursing, discussions with consultants, evaluation of patient's response to treatment, examination of patient, obtaining history from patient or surrogate, ordering and performing treatments and interventions, ordering and review of laboratory studies, ordering and review of radiographic studies, pulse oximetry and re-evaluation of patient's condition.    Vida Rigger, M.D.  Pulmonary & Critical Care Medicine

## 2023-04-18 NOTE — Consult Note (Signed)
PHARMACY CONSULT NOTE  Pharmacy Consult for Electrolyte Monitoring and Replacement   Recent Labs: Potassium (mmol/L)  Date Value  04/18/2023 4.1  01/03/2014 3.6   Magnesium (mg/dL)  Date Value  14/78/2956 1.7  01/03/2014 1.9   Calcium (mg/dL)  Date Value  21/30/8657 8.7 (L)   Calcium, Total (mg/dL)  Date Value  84/69/6295 7.4 (L)   Albumin (g/dL)  Date Value  28/41/3244 2.1 (L)  07/31/2022 2.9 (L)  01/01/2014 3.2 (L)   Phosphorus (mg/dL)  Date Value  12/06/7251 4.2   Sodium (mmol/L)  Date Value  04/18/2023 132 (L)  07/31/2022 137  01/03/2014 139    Assessment: 72 y.o female w/ significant PMH of COPD, Emphysema, GERD, RA, Fibromyalgia, tobacco abuse, h/o drug abuse (cocaine). EtOH Abuse, Anxiety and Depression and recent diagnosis of chest wall mass concerning for Lymphadenopathy who presented to the ED with chief complaints of acute respiratory distress. Patient currently intubated and transferred to ICU. Pharmacy has been consulted to manage and replace electrolytes while under PCCM care.  Diet/Nutrition: Vital 1.5 at 40 mL/hr. Free water 30 ml q4H.   Goal of Therapy:  Electrolytes within normal limits  Plan:  --No electrolyte replacement indicated at this time --Follow-up electrolytes with AM labs tomorrow  Tressie Ellis 04/18/2023 8:08 AM

## 2023-04-18 NOTE — Progress Notes (Signed)
Palliative Care Progress Note, Assessment & Plan   Patient Name: Vanessa Oneal       Date: 04/18/2023 DOB: Jan 05, 1951  Age: 72 y.o. MRN#: [id number] Attending Physician: Vida Rigger, MD Primary Care Physician: Dorcas Carrow, DO Admit Date: 03/31/2023  Subjective: Patient is lying in bed on MV with sedation. She acknowledges my presence but is unable to make her wishes known. No family present during my visits.   HPI: 72 y.o female w/ significant PMH of COPD, Emphysema, GERD, RA, Fibromyalgia, tobacco abuse, h/o drug abuse (cocaine). EtOH Abuse, Anxiety and Depression and recent diagnosis of chest wall mass concerning for Lymphadenopathy who presented to the ED with chief complaints of acute respiratory distress.   On review of chart, patient recently presented to [facility] emergency room on 03/27/2023 with complaints of chest pain.  CTA chest and CT abdomen pelvis with contrast was obtained which showed extensive mediastinal soft tissue mass and a large retroperitoneal soft tissue mass favored to represent lymphoma. Patient was referred to outpatient oncology for further evaluation.  Patient was evaluated by oncologist Dr. Cathie Hoops on 03/29/2023 who recommended a PET scan and ultrasound-guided biopsy of the right supraclavicular lymphadenopathy for further evaluation - never completed PTA.   Prior to presenting to the ED, patient had been complaining of progressive shortness of breath  all day and was found with oxygen saturation of 66% on room air.  She was placed on a nonrebreather and given Solu-Medrol 125 and 2 albuterol DuoNebs prior to transporting to the ED for further evaluation.   5/7 - trach placed   5/12 - GOC discussions with patient's sons and niece. Diagnosis of neuroendocrine/SCLC confirmed.     Palliative Medicine consulted for assisting with goals of care conversations.  Summary of counseling/coordination of care: After reviewing the patient's chart and assessing the patient at bedside, I spoke with patient's son [name] over the phone. I expressed concern that patient is dying and suffering. She is unable to breath or rremain comfortable without use of ventilator and high doses of sedatives/anti-anxiolytics. Reviewed end stage COPD with aggressive, extensive small cell lung cancer.   Discussed quality of life vs quantity as well as relieving patient from suffering.  We discuss goals and values important to the patient. I highlighted that patient has expressed in the past that she would never want her life prolonged on machines or artificially kept alive. [name] shared he does not want his mother to suffer. He says he does not know what to think right now but wants to meet with his brother to decide next steps. He plans to be at the hospital when he is off of work today.   I attempted to speak with patient's son [name]. No answer. HIPAA compliant VM left for [name] to return my call. [name] shares he is going to attempt to get in touch with [name] as well.  I spoke with patient's niece [name] over the phone. She plans to be at the hospital with [name] today at 3pm.   At 4 PM, I met with patient, her son [name], her niece [name], and her son at bedside with CCM NP Delton See.  We discussed patient's decline in respiratory status and  inability to ventilate and remain calm when sedation is cut back.  Mites shares he has noted shift in his mother as well.  We reviewed quality versus quantity of life.  Discussed relieving her from suffering and liberating her from machines.  [name] was in agreement to move forward with comfort measures tomorrow once family is unable to visit.  He and [name] plan to be bedside at 1 PM.  He is going to relay information to [place] who will hopefully be bedside tomorrow as  well.  Plan for comfort measures at 1 PM when or when family arrives and is ready tomorrow.  Physical Exam Vitals reviewed.  Constitutional:      General: She is not in acute distress.    Appearance: She is ill-appearing.  HENT:     Head:     Comments: Temporal wasting, drooping of nasolabial folds    Mouth/Throat:     Mouth: Mucous membranes are moist.  Cardiovascular:     Rate and Rhythm: Normal rate.     Pulses: Normal pulses.  Pulmonary:     Comments: Trach with MV Abdominal:     Palpations: Abdomen is soft.  Skin:    General: Skin is warm.     Coloration: Skin is pale.  Neurological:     Comments: nonverbal             Total Time 50 minutes   Annelies Coyt L. Manon Hilding, FNP-BC Palliative Medicine Team Team Phone # [phone]

## 2023-04-19 DIAGNOSIS — J9621 Acute and chronic respiratory failure with hypoxia: Secondary | ICD-10-CM | POA: Diagnosis not present

## 2023-04-19 DIAGNOSIS — J441 Chronic obstructive pulmonary disease with (acute) exacerbation: Secondary | ICD-10-CM | POA: Diagnosis not present

## 2023-04-19 DIAGNOSIS — J189 Pneumonia, unspecified organism: Secondary | ICD-10-CM | POA: Diagnosis not present

## 2023-04-19 DIAGNOSIS — J9601 Acute respiratory failure with hypoxia: Secondary | ICD-10-CM | POA: Diagnosis not present

## 2023-04-19 LAB — CBC
HCT: 26.2 % — ABNORMAL LOW (ref 36.0–46.0)
Hemoglobin: 8.5 g/dL — ABNORMAL LOW (ref 12.0–15.0)
MCH: 30.9 pg (ref 26.0–34.0)
MCHC: 32.4 g/dL (ref 30.0–36.0)
MCV: 95.3 fL (ref 80.0–100.0)
Platelets: 336 10*3/uL (ref 150–400)
RBC: 2.75 MIL/uL — ABNORMAL LOW (ref 3.87–5.11)
RDW: 13.8 % (ref 11.5–15.5)
WBC: 8 10*3/uL (ref 4.0–10.5)
nRBC: 0 % (ref 0.0–0.2)

## 2023-04-19 LAB — TRIGLYCERIDES: Triglycerides: 48 mg/dL (ref <150)

## 2023-04-19 LAB — RENAL FUNCTION PANEL
Albumin: 2.2 g/dL — ABNORMAL LOW (ref 3.5–5.0)
Anion gap: 9 (ref 5–15)
BUN: 27 mg/dL — ABNORMAL HIGH (ref 8–23)
CO2: 29 mmol/L (ref 22–32)
Calcium: 8.7 mg/dL — ABNORMAL LOW (ref 8.9–10.3)
Chloride: 93 mmol/L — ABNORMAL LOW (ref 98–111)
Creatinine, Ser: 0.4 mg/dL — ABNORMAL LOW (ref 0.44–1.00)
GFR, Estimated: >60 mL/min (ref >60)
Glucose, Bld: 117 mg/dL — ABNORMAL HIGH (ref 70–99)
Phosphorus: 3.5 mg/dL (ref 2.5–4.6)
Potassium: 4.3 mmol/L (ref 3.5–5.1)
Sodium: 131 mmol/L — ABNORMAL LOW (ref 135–145)

## 2023-04-19 LAB — GLUCOSE, CAPILLARY
Glucose-Capillary: 143 mg/dL — ABNORMAL HIGH (ref 70–99)
Glucose-Capillary: 134 mg/dL — ABNORMAL HIGH (ref 70–99)
Glucose-Capillary: 147 mg/dL — ABNORMAL HIGH (ref 70–99)
Glucose-Capillary: 148 mg/dL — ABNORMAL HIGH (ref 70–99)
Glucose-Capillary: 130 mg/dL — ABNORMAL HIGH (ref 70–99)
Glucose-Capillary: 147 mg/dL — ABNORMAL HIGH (ref 70–99)

## 2023-04-19 LAB — MAGNESIUM: Magnesium: 1.8 mg/dL (ref 1.7–2.4)

## 2023-04-19 NOTE — Progress Notes (Signed)
NAME:  Vanessa Oneal, MRN:  [id number], DOB:  1951/07/20, LOS: 19 ADMISSION DATE:  03/31/2023, CONSULTATION DATE:  03/31/2023 REFERRING MD:  ED Provider, CHIEF COMPLAINT:  Respiratory Failure   Brief Pt Description / Synopsis:  Patient is a 72 year old female with a past medical history of COPD/emphysema presenting to the hospital with acute on chronic hypoxic and hypercapnic respiratory due to Acute COPD Exacerbation requiring intubation and mechanical ventilation. Course has been complicated by failure to wean off the ventilator secondary to extremely elevated sedation requirements.   History of Present Illness:  72 y.o female w/ significant PMH of COPD, Emphysema, GERD, RA, Fibromyalgia, tobacco abuse, h/o drug abuse (cocaine). EtOH Abuse, Anxiety and Depression and recent diagnosis of chest wall mass concerning for Lymphadenopathy who presented to the ED with chief complaints of acute respiratory distress.   On review of chart, patient recently presented to [facility] emergency room on 03/27/2023 with complaints of chest pain.  CTA chest and CT abdomen pelvis with contrast was obtained which showed extensive mediastinal soft tissue mass and a large retroperitoneal soft tissue mass favored to represent lymphoma. Patient was referred to outpatient oncology for further evaluation.  Patient was evaluated by oncologist Dr. Cathie Hoops on 03/29/2023 who recommended a PET scan and ultrasound-guided biopsy of the right supraclavicular lymphadenopathy for further evaluation.  She also had myeloma panel and peripheral blood flow cytometric and LDH obtained.  Prior to presenting to the ED, patient had been complaining of progressive shortness of breath  all day and was found with oxygen saturation of 66% on room air.  She was placed on a nonrebreather and given Solu-Medrol 125 and 2 albuterol DuoNebs prior to transporting to the ED for further evaluation.   ED Course: Initial vital signs showed HR of 77 beats/minute, BP  90/60 mm Hg, the RR 22 breaths/minute, and the oxygen saturation 78% on NRB and a temperature of 98.69F (36.8C).  He was noted to be in acute respiratory distress with increased work of breathing.  Due to concerns for impending respiratory arrest, patient was intubated for airway protection.   04/16/23- patient remains mildy aggitated this am despite versed and precedex drip infusing, she is receiving OG feeds.  Plan for family meeting and potentially de-escalation of therapy this afternoon.   04/17/23- family came in yesterday but did not agree to comfort measures.  They wish for patient to remain on sedation via IV infusion for now so they can meet with her.  Data shows electrolyte derangements with severe protein calorie malnutrition and chronic anemia.  She does shows signs of severe discomfort on artifical life support with mechanical ventilation but with infusion of anlagosedation she seems to have less suffering.  I anticipate in hospital death due to very poor prognosis with multiple comorbidities.   04/18/23 - patients family is here today for additional discussion regarding comfort measures.  Palliative care personnel is with [place] during comfort care discussion.   04/19/23- -family was not ready to transition to comfort care and wish to keep patient on full support at this time.  Ventilator management performed , patient on PRVC, resistance and compliance is within acceptable parameters.    Pertinent  Medical History   Past Medical History:  Diagnosis Date   Arthritis    RA   Depression    Fibromyalgia    GERD (gastroesophageal reflux disease)    H/O drug dependence (HCC)    opiods   Wears dentures    full upper  Micro Data:  4/27: COVID-19/RSV/Flu PCR>>negative 4/27: Respiratory viral panel>>negative 4/27: Strep pneumo urinary antigen>>negative 4/27: Legionella urinary antigen>>negative 4/27: Blood culture x2>>negative 4/27: MRSA PCR>>negative 4/27: BAL>> no  growth  Antimicrobials:   Anti-infectives (From admission, onward)    Start     Dose/Rate Route Frequency Ordered Stop   04/15/23 1000  Ampicillin-Sulbactam (UNASYN) 3 g in sodium chloride 0.9 % 100 mL IVPB        3 g 200 mL/hr over 30 Minutes Intravenous Every 6 hours 04/15/23 0826 04/17/23 2134   04/13/23 0345  Ampicillin-Sulbactam (UNASYN) 3 g in sodium chloride 0.9 % 100 mL IVPB  Status:  Discontinued        3 g 200 mL/hr over 30 Minutes Intravenous Every 6 hours 04/13/23 0248 04/15/23 0826   04/01/23 0500  vancomycin (VANCOREADY) IVPB 500 mg/100 mL  Status:  Discontinued        500 mg 100 mL/hr over 60 Minutes Intravenous Every 24 hours 03/31/23 0643 04/01/23 1021   03/31/23 1800  ceFEPIme (MAXIPIME) 2 g in sodium chloride 0.9 % 100 mL IVPB  Status:  Discontinued        2 g 200 mL/hr over 30 Minutes Intravenous Every 12 hours 03/31/23 0643 04/04/23 1057   03/31/23 0100  vancomycin (VANCOCIN) IVPB 1000 mg/200 mL premix        1,000 mg 200 mL/hr over 60 Minutes Intravenous  Once 03/31/23 0048 03/31/23 0220   03/31/23 0100  ceFEPIme (MAXIPIME) 2 g in sodium chloride 0.9 % 100 mL IVPB        2 g 200 mL/hr over 30 Minutes Intravenous  Once 03/31/23 0048 03/31/23 0201        Significant Hospital Events: Including procedures, antibiotic start and stop dates in addition to other pertinent events   4/27: Chest Xray>> Endotracheal tube tip about 3.5 cm superior to carina. Esophageal tube tip below the diaphragm but incompletely visualized. Diffuse bilateral interstitial and hazy lung opacity, either representing edema or diffuse lung infection. More focal nodular opacity in the right mid lung may be infectious/ inflammatory or neoplastic in etiology. 4/27: CTA Chest/Abdomen/Pelvis>>Moderate left and small right pleural effusions. Emphysema. Diffuse bilateral septal thickening suggests underlying edema. Heterogeneous bilateral airspace consolidations throughout the lungs suspicious for  multifocal pneumonia. Extensive cystic/low-density right supraclavicular, mediastinal, hilar, upper abdominal and retroperitoneal adenopathy. Differential considerations include necrotic metastatic disease, infectious etiology such as tuberculous or nontuberculous mycobacterial infection, and lymphoproliferative disease. Endotracheal tube tip about 1.2 cm superior to carina. Esophageal tube tip just beyond GE junction, suggest further advancement for more optimal positioning, there is moderate fluid distension of the stomach Small volume abdominopelvic ascites. Aortic Atherosclerosis 4/28: Pt remains mechanically intubated vent settings: FiO2 50%/PEEP 5.  Pt with severe anxiety on precedex and fentanyl gtts will start antianxiety medications per tubes  4/29: severe COPD, alert, unable to wean from vent 5/1: s/p PEG and LN biopsy 5/2: remains on vent, severe end stage COPD, failure to wean from vent 5/3: remains on vent, severe COPD, failure to wean from vent 5/4: failure to wean from vent 5/6: Pt remains mechanically intubated heavily sedated with propofol/versed/fentanyl gtts due to severe anxiety/agitation pending trach placement 05/7 5/7: Pt underwent tracheostomy placement size #6 shiley per ENT.  Will start weaning sedatives  5/9: Patient awake, follows commands, on trach collar  5/10: Overnight pt severely agitated with increased WOB.  Required Propofol for a time, sedated this morning.  CXR with worsening of bilateral airspace opacities~ gently diuresed and  placed on empiric Unasyn. 5/11: Pt weaned off versed gtt and sedated with precedex gtt @1 .2 mcg/kg/hr tolerating PS 10/5  Interim History / Subjective:  As outlined above under significant events   Objective   Blood pressure 90/63, pulse 92, temperature 98 F (36.7 C), temperature source Oral, resp. rate (!) 23, height 5\' 1"  (1.549 m), weight 40.7 kg, SpO2 100 %.    Vent Mode: PRVC FiO2 (%):  [35 %] 35 % Set Rate:  [18 bmp] 18  bmp Vt Set:  [380 mL] 380 mL PEEP:  [5 cmH20] 5 cmH20 Plateau Pressure:  [19 cmH20-20 cmH20] 20 cmH20   Intake/Output Summary (Last 24 hours) at 04/19/2023 1024 Last data filed at 04/19/2023 0700 Gross per 24 hour  Intake 1207.78 ml  Output 1775 ml  Net -567.22 ml    Filed Weights   04/17/23 0500 04/18/23 0300 04/19/23 0300  Weight: 42.5 kg 40.9 kg 40.7 kg    Examination: General: Acute on chronically ill appearing cachectic female, NAD mechanically ventilated via tracheostomy  HENT: Atraumatic, normocephalic, neck supple, no JVD, size #6 shiley tracheostomy midline clean/intact  Lungs: Diffuse rhonchi throughout, even, non labored  Cardiovascular: RRR, s1s2, no M/R/G, 2+ radial/1+ distal pulses, no edema  Abdomen: +BS x4, soft, mildly distended, PEG clean dry and intact Extremities: No deformities, no edema Neuro: Lightly sedated, following commands, PERRL GU: Foley catheter in place draining yellow urine  Resolved Hospital Problem list     Assessment & Plan:   #Acute on Chronic Hypoxic & Hypercapnic Respiratory Failure due to AECOPD Presented with respiratory failure secondary to pneumonia and COPD exacerbation. Treated with antibiotics, steroids, and nebulizer therapy. She's failed to wean off the ventilator, and previously refused extubation during goals of care discussions. She is s/p tracheostomy,  - Full vent support, implement lung protective strategies - Plateau pressures less than 30 cm H20 - Wean FiO2 & PEEP as tolerated to maintain O2 sats 88 to 92% - Follow intermittent Chest X-ray & ABG as needed - Spontaneous Breathing Trials / Trach collar trials as tolerated - Implement VAP Bundle - Bronchodilators - ABX as above - Diuresis as BP and renal function permits   #HFrEF (Systolic and Diastolic dysfunction) #Demand ischemia TTE from 04/01/2023 with systolic and diastolic dysfunction. Troponin elevated on presentation (peak 871), attributed to demand.  Hemodynamically stable. Continue to monitor. - Continuous cardiac monitoring - Maintain MAP >65 - Vasopressors as needed to maintain MAP goal ~ not requirijng - BNP was 634 - Diuresis as BP and renal function permits: 20 mg iv lasix x1 dose 05/12   #Hypomagnesia  - Trend BMP  - Replace electrolytes as indicated  - Monitor UOP  #Metastatic Malignancy #Extensive Stage Small Cell Lung Cancer (neuroendocrine) She had a lymph node biopsies secondary to concern for malignancy (with result returning positive for metastatic malignancy). Neuroendocrine/small cell lung cancer, extensive stage. Patient is not a current candidate for any chemotherapy, and oncology will hold off on evaluating her at the moment, thou she could benefit from being evaluated by them in the future.  #New leukocytosis concerning for possible aspiration pneumonia~improving   - Trend WBC and monitor fever curve  - Continue empiric Unasyn stop date   #Anxiety #Analgo-sedation She is now requiring dexmedetomidine and midazolam infusions for sedation, which we are attempting to wean off by increasing the dose of parenteral medications. -Maintain a RASS goal of 0 - Precedex gtt will attempt to wean versed gtt off first  - Continue scheduled valium, oxycodone,  and seroquel  - Follow Qtc    #Gastrointestinal - Continue tube feeds. H2B for SUP - KUB pending due to mild abdominal distension    #Renal Kidney function at baseline. Continue to monitor and replete electrolytes as needed   #Endocrine - Continue SSI  - CBG's q4hrs      Pt with severe COPD, Metastatic Malignancy/Extensive Stage Small Cell Lung Cancer (neuroendocrine), overall long term prognosis is extremely poor.  Recommend DNR status.   Best Practice (right click and "Reselect all SmartList Selections" daily)   Diet/type: tubefeeds and NPO DVT prophylaxis: prophylactic heparin  GI prophylaxis: H2B Lines: Right upper extremity PICC line and  still needed  Foley:  Yes, and it is still needed (urinary retention) Code Status:  full code Last date of multidisciplinary goals of care discussion [04/14/2023]  5/11: Will update pt's family when they arrive at bedside. Labs   CBC: Recent Labs  Lab 04/15/23 0402 04/16/23 0419 04/17/23 0554 04/18/23 0350 04/19/23 0401  WBC 6.5 5.6 6.5 6.1 8.0  HGB 7.4* 7.3* 8.0* 8.3* 8.5*  HCT 22.8* 22.1* 24.9* 25.2* 26.2*  MCV 95.4 94.0 95.0 95.5 95.3  PLT 219 221 268 274 336     Basic Metabolic Panel: Recent Labs  Lab 04/15/23 0402 04/16/23 0419 04/17/23 0554 04/18/23 0350 04/19/23 0401  NA 140 141 132* 132* 131*  K 3.2* 3.4* 4.1 4.1 4.3  CL 101 98 98 96* 93*  CO2 25 25 27 28 29   GLUCOSE 185* 132* 165* 167* 117*  BUN 17 15 18 17  27*  CREATININE 0.58 0.59 0.43* 0.39* 0.40*  CALCIUM 7.2* 7.2* 8.4* 8.7* 8.7*  MG 1.6* 1.7 1.8 1.7 1.8  PHOS 4.2 3.6 4.7* 4.2 3.5    GFR: Estimated Creatinine Clearance: 41.4 mL/min (A) (by C-G formula based on SCr of 0.4 mg/dL (L)). Recent Labs  Lab 04/12/23 2219 04/13/23 0445 04/13/23 0445 04/14/23 0356 04/15/23 0402 04/16/23 0419 04/17/23 0554 04/18/23 0350 04/19/23 0401  PROCALCITON <0.10 0.18  --  0.12  --   --   --   --   --   WBC  --  12.2*   < > 8.5   < > 5.6 6.5 6.1 8.0   < > = values in this interval not displayed.     Liver Function Tests: Recent Labs  Lab 04/15/23 0402 04/16/23 0419 04/17/23 0554 04/18/23 0350 04/19/23 0401  ALBUMIN 2.0* 1.9* 2.0* 2.1* 2.2*    No results for input(s): "LIPASE", "AMYLASE" in the last 168 hours. No results for input(s): "AMMONIA" in the last 168 hours.  ABG    Component Value Date/Time   PHART 7.5 (H) 04/13/2023 0242   PCO2ART 38 04/13/2023 0242   PO2ART 79 (L) 04/13/2023 0242   HCO3 29.6 (H) 04/13/2023 0242   O2SAT 97 04/13/2023 0242     Coagulation Profile: No results for input(s): "INR", "PROTIME" in the last 168 hours.   Cardiac Enzymes: No results for input(s):  "CKTOTAL", "CKMB", "CKMBINDEX", "TROPONINI" in the last 168 hours.  HbA1C: HB A1C (BAYER DCA - WAIVED)  Date/Time Value Ref Range Status  01/05/2020 03:31 PM 4.8 <7.0 % Final    Comment:                                          Diabetic Adult            <7.0  Healthy Adult        4.3 - 5.7                                                           (DCCT/NGSP) American Diabetes Association's Summary of Glycemic Recommendations for Adults with Diabetes: Hemoglobin A1c <7.0%. More stringent glycemic goals (A1c <6.0%) may further reduce complications at the cost of increased risk of hypoglycemia.   11/19/2017 01:41 PM 4.9 <7.0 % Final    Comment:                                          Diabetic Adult            <7.0                                       Healthy Adult        4.3 - 5.7                                                           (DCCT/NGSP) American Diabetes Association's Summary of Glycemic Recommendations for Adults with Diabetes: Hemoglobin A1c <7.0%. More stringent glycemic goals (A1c <6.0%) may further reduce complications at the cost of increased risk of hypoglycemia.    Hgb A1c MFr Bld  Date/Time Value Ref Range Status  04/01/2023 03:59 AM 6.2 (H) 4.8 - 5.6 % Final    Comment:    (NOTE) Pre diabetes:          5.7%-6.4%  Diabetes:              >6.4%  Glycemic control for   <7.0% adults with diabetes   07/18/2022 01:12 AM 5.1 4.8 - 5.6 % Final    Comment:    (NOTE) Pre diabetes:          5.7%-6.4%  Diabetes:              >6.4%  Glycemic control for   <7.0% adults with diabetes     CBG: Recent Labs  Lab 04/18/23 1631 04/18/23 1904 04/18/23 2314 04/18/23 2341 04/19/23 0729  GLUCAP 149* 143* 131* 142* 134*     Review of Systems:   Unable to assess due to sedation  Past Medical History:  She,  has a past medical history of Arthritis, Depression, Fibromyalgia, GERD (gastroesophageal reflux disease), H/O  drug dependence (HCC), and Wears dentures.   Surgical History:   Past Surgical History:  Procedure Laterality Date   BREAST SURGERY     Silicone implants, then removal   CATARACT EXTRACTION W/PHACO Left 05/06/2019   Procedure: CATARACT EXTRACTION PHACO AND INTRAOCULAR LENS PLACEMENT (IOC) LEFT;  Surgeon: Galen Manila, MD;  Location: [facility];  Service: Ophthalmology;  Laterality: Left;   IR GASTROSTOMY TUBE MOD SED  04/04/2023   IR US LIVER BIOPSY  04/04/2023   TRACHEOSTOMY TUBE PLACEMENT N/A 04/10/2023   Procedure: TRACHEOSTOMY;  Surgeon: Linus Salmons,  MD;  Location: [facility];  Service: ENT;  Laterality: N/A;   TUBAL LIGATION       Social History:   reports that she has been smoking cigarettes. She has a 14.00 pack-year smoking history. She has never used smokeless tobacco. She reports that she does not currently use alcohol. She reports that she does not use drugs.   Family History:  Her family history includes COPD in her sister; Suicidality in her father.   Allergies Allergies  Allergen Reactions   Gabapentin Anaphylaxis     Home Medications  Prior to Admission medications   Medication Sig Start Date End Date Taking? Authorizing Provider  naloxone [facility]) nasal spray 4 mg/0.1 mL 1 spray into the nostril 1x, may repeat dose in alternate nostrils every 2-3 minutes until EMS arrives 03/29/23  Yes Johnson, Megan P, DO  promethazine (PHENERGAN) 25 MG tablet TAKE 1/2 TABLET BY MOUTH EVERY 8 HOURS AS NEEDED FOR NAUSEA AND VOMITING 07/31/22  Yes Johnson, Megan P, DO  albuterol (VENTOLIN HFA) 108 (90 Base) MCG/ACT inhaler Inhale 2 puffs into the lungs every 6 (six) hours as needed for wheezing or shortness of breath. Patient not taking: Reported on 03/29/2023 07/23/22   Kathrynn Running, MD  clonazePAM (KLONOPIN) 0.5 MG tablet Take by mouth.    [provider]  Fluticasone-Umeclidin-Vilant (TRELEGY ELLIPTA) 100-62.5-25 MCG/ACT AEPB Inhale 1 Inhalation into the  lungs daily in the afternoon. Patient not taking: Reported on 03/29/2023 07/31/22   Dorcas Carrow, DO     Critical care provider statement:   Total critical care time: 33 minutes   Performed by: Karna Christmas MD   Critical care time was exclusive of separately billable procedures and treating other patients.   Critical care was necessary to treat or prevent imminent or life-threatening deterioration.   Critical care was time spent personally by me on the following activities: development of treatment plan with patient and/or surrogate as well as nursing, discussions with consultants, evaluation of patient's response to treatment, examination of patient, obtaining history from patient or surrogate, ordering and performing treatments and interventions, ordering and review of laboratory studies, ordering and review of radiographic studies, pulse oximetry and re-evaluation of patient's condition.    Vida Rigger, M.D.  Pulmonary & Critical Care Medicine

## 2023-04-19 NOTE — Consult Note (Signed)
PHARMACY CONSULT NOTE  Pharmacy Consult for Electrolyte Monitoring and Replacement   Recent Labs: Potassium (mmol/L)  Date Value  04/19/2023 4.3  01/03/2014 3.6   Magnesium (mg/dL)  Date Value  16/09/9603 1.8  01/03/2014 1.9   Calcium (mg/dL)  Date Value  54/08/8118 8.7 (L)   Calcium, Total (mg/dL)  Date Value  14/78/2956 7.4 (L)   Albumin (g/dL)  Date Value  21/30/8657 2.2 (L)  07/31/2022 2.9 (L)  01/01/2014 3.2 (L)   Phosphorus (mg/dL)  Date Value  84/69/6295 3.5   Sodium (mmol/L)  Date Value  04/19/2023 131 (L)  07/31/2022 137  01/03/2014 139    Assessment: 72 y.o female w/ significant PMH of COPD, Emphysema, GERD, RA, Fibromyalgia, tobacco abuse, h/o drug abuse (cocaine). EtOH Abuse, Anxiety and Depression and recent diagnosis of chest wall mass concerning for Lymphadenopathy who presented to the ED with chief complaints of acute respiratory distress. Patient currently intubated and transferred to ICU. Pharmacy has been consulted to manage and replace electrolytes while under PCCM care.  Diet/Nutrition: Vital 1.5 at 40 mL/hr. Free water 30 ml q4H.   Goal of Therapy:  Electrolytes within normal limits  Plan:  --Mild stable hyponatremia --No electrolyte replacement indicated at this time --Given stability of labs will discontinue daily monitoring and switch to q72h monitoring  Tressie Ellis 04/19/2023 8:12 AM

## 2023-04-19 NOTE — Progress Notes (Addendum)
Palliative Care Progress Note, Assessment & Plan   Patient Name: Vanessa Oneal       Date: 04/19/2023 DOB: 03-Apr-1951  Age: 72 y.o. MRN#: [id number] Attending Physician: Vida Rigger, MD Primary Care Physician: Dorcas Carrow, DO Admit Date: 03/31/2023  Subjective: Patient is lying in bed with trach in place.  She is on full ventilatory support.  Her eyes are open but she is unable to track me.  She is unable to make her wishes known.  No family or friends present at bedside during my visit.  HPI: 72 y.o female w/ significant PMH of COPD, Emphysema, GERD, RA, Fibromyalgia, tobacco abuse, h/o drug abuse (cocaine). EtOH Abuse, Anxiety and Depression and recent diagnosis of chest wall mass concerning for Lymphadenopathy who presented to the ED with chief complaints of acute respiratory distress.   On review of chart, patient recently presented to [facility] emergency room on 03/27/2023 with complaints of chest pain.  CTA chest and CT abdomen pelvis with contrast was obtained which showed extensive mediastinal soft tissue mass and a large retroperitoneal soft tissue mass favored to represent lymphoma. Patient was referred to outpatient oncology for further evaluation.  Patient was evaluated by oncologist Dr. Cathie Hoops on 03/29/2023 who recommended a PET scan and ultrasound-guided biopsy of the right supraclavicular lymphadenopathy for further evaluation - never completed PTA.   Prior to presenting to the ED, patient had been complaining of progressive shortness of breath  all day and was found with oxygen saturation of 66% on room air.  She was placed on a nonrebreather and given Solu-Medrol 125 and 2 albuterol DuoNebs prior to transporting to the ED for further evaluation.   5/7 - trach placed   5/12 - GOC discussions  with patient's sons and niece. Diagnosis of neuroendocrine/SCLC confirmed.    Palliative Medicine consulted for assisting with goals of care conversations.  Summary of counseling/coordination of care: After reviewing the patient's chart and assessing the patient at bedside, I counseled with dayshift RN Harrold Donath, CCM MD Karna Christmas and NP Delton See. Plan remains to proceed with comfort measures when family arrives today.   Patient's son Vanessa Oneal and Vanessa Oneal plan to be bedside at 1pm and they are coordinating for son [name] to be bedside at that time as well.   Update: No family came bedside at 1pm as planned. I have requested that RN contact PMT if family arrives bedside today.   4pm - I spoke with patient's son Vanessa Oneal over the phone who shares he is on the way to the hospital now. CCM made aware. I advise Vanessa Oneal to spend time with his mother and notify the medical team when he is prepared to shift to full comfort measures.   PMT will remain available to patient/family throughout her hospitalization.   Physical Exam Constitutional:      Appearance: She is ill-appearing.  HENT:     Head:     Comments: Temporal wasting    Nose:     Comments: Drooping of nasolabial folds    Mouth/Throat:     Comments: Hyperextension of neck Cardiovascular:     Pulses: Normal pulses.  Pulmonary:     Comments: Trach with MV support Abdominal:  Palpations: Abdomen is soft.  Skin:    General: Skin is warm.     Coloration: Skin is pale.  Neurological:     Comments: nonverbal             Total Time 25 minutes   Jeydan Barner L. Manon Hilding, FNP-BC Palliative Medicine Team Team Phone # [phone]

## 2023-04-20 DIAGNOSIS — C3490 Malignant neoplasm of unspecified part of unspecified bronchus or lung: Secondary | ICD-10-CM | POA: Diagnosis not present

## 2023-04-20 DIAGNOSIS — J441 Chronic obstructive pulmonary disease with (acute) exacerbation: Secondary | ICD-10-CM | POA: Diagnosis not present

## 2023-04-20 DIAGNOSIS — J9621 Acute and chronic respiratory failure with hypoxia: Secondary | ICD-10-CM | POA: Diagnosis not present

## 2023-04-20 DIAGNOSIS — J9601 Acute respiratory failure with hypoxia: Secondary | ICD-10-CM | POA: Diagnosis not present

## 2023-04-20 LAB — CBC
HCT: 28.3 % — ABNORMAL LOW (ref 36.0–46.0)
Hemoglobin: 9.2 g/dL — ABNORMAL LOW (ref 12.0–15.0)
MCH: 30.8 pg (ref 26.0–34.0)
MCHC: 32.5 g/dL (ref 30.0–36.0)
MCV: 94.6 fL (ref 80.0–100.0)
Platelets: 357 10*3/uL (ref 150–400)
RBC: 2.99 MIL/uL — ABNORMAL LOW (ref 3.87–5.11)
RDW: 13.7 % (ref 11.5–15.5)
WBC: 8.6 10*3/uL (ref 4.0–10.5)
nRBC: 0 % (ref 0.0–0.2)

## 2023-04-20 LAB — GLUCOSE, CAPILLARY
Glucose-Capillary: 137 mg/dL — ABNORMAL HIGH (ref 70–99)
Glucose-Capillary: 125 mg/dL — ABNORMAL HIGH (ref 70–99)
Glucose-Capillary: 157 mg/dL — ABNORMAL HIGH (ref 70–99)

## 2023-04-20 MED ORDER — LORAZEPAM 2 MG/ML IJ SOLN
1.0000 mg | INTRAMUSCULAR | Status: DC | PRN
  Filled 2023-04-20: qty 1

## 2023-04-20 MED ORDER — SODIUM CHLORIDE 0.9 % IV SOLN: INTRAVENOUS | Status: DC

## 2023-04-20 MED ORDER — POLYVINYL ALCOHOL 1.4 % OP SOLN: 1.0000 [drp] | Freq: Four times a day (QID) | OPHTHALMIC | Status: DC | PRN

## 2023-04-20 MED ORDER — GLYCOPYRROLATE 0.2 MG/ML IJ SOLN: 0.2000 mg | INTRAMUSCULAR | Status: DC | PRN

## 2023-04-20 MED ORDER — INSULIN ASPART 100 UNIT/ML IJ SOLN: 0.0000 [IU] | Freq: Four times a day (QID) | INTRAMUSCULAR | Status: DC

## 2023-04-20 MED ORDER — HYDROMORPHONE HCL-NACL 50-0.9 MG/50ML-% IV SOLN
1.0000 mg/h | INTRAVENOUS | Status: DC
  Administered 2023-04-20: 1 mg/h via INTRAVENOUS
  Filled 2023-04-20: qty 50

## 2023-04-20 MED ORDER — GLYCOPYRROLATE 1 MG PO TABS: 1.0000 mg | ORAL_TABLET | ORAL | Status: DC | PRN

## 2023-04-20 MED ORDER — HYDROMORPHONE BOLUS VIA INFUSION
1.0000 mg | INTRAVENOUS | Status: DC | PRN
  Administered 2023-04-20: 1 mg via INTRAVENOUS

## 2023-04-20 MED ORDER — HALOPERIDOL LACTATE 2 MG/ML PO CONC
5.0000 mg | Freq: Three times a day (TID) | ORAL | Status: DC
  Administered 2023-04-20: 5 mg
  Filled 2023-04-20 (×5): qty 2.5

## 2023-04-20 MED ORDER — LORAZEPAM 2 MG/ML IJ SOLN
1.0000 mg | INTRAMUSCULAR | Status: DC | PRN
  Administered 2023-04-20: 2 mg via INTRAVENOUS

## 2023-04-20 MED ORDER — PROPOFOL 1000 MG/100ML IV EMUL: 5.0000 ug/kg/min | INTRAVENOUS | Status: DC

## 2023-05-01 LAB — FUNGUS CULTURE RESULT

## 2023-05-01 LAB — FUNGAL ORGANISM REFLEX

## 2023-05-01 LAB — FUNGUS CULTURE WITH STAIN

## 2023-05-05 NOTE — IPAL (Signed)
  Interdisciplinary Goals of Care Family Meeting   Date carried out: 04/10/2023  Location of the meeting: Conference room  Member's involved: Nurse Practitioner, Family Member or next of kin, and Palliative care team member  Durable Power of Attorney or acting medical decision maker: Pt's son Vanessa Oneal, pt's son Vanessa Oneal, pt's niece Vanessa Oneal all in conference meeting, and and all with consensus decision.  Discussion: We discussed goals of care for Vanessa Oneal .  We discussed that the patient has severe lung damage from End Stage COPD and newly diagnosed neuroendocrine/small cell lung cancer (extensive staging), and that she is not a current candidate for any chemotherapy at this time as per Oncology.   Pt severely deconditioned and cachetic due to advanced lung disease.  Discussed that she has been unable to liberate from mechanical ventilation, and that despite mechanical ventilation, she struggles to breathe/suffocates if she is not sedated.    Vanessa Oneal expressed that he has noticed a significant decline in her, even before presenting to the hospital.  All family verbalize that they "do not want to see her suffer anymore", and they also all verbalize that she has mentioned to them in the past that she would not want to live a in a facility.  Vanessa Oneal mentions "it is time to let her go".  They are all in agreement with shifting to COMFORT MEASURES ONLY and to withdraw all life sustaining measures.    Vanessa Oneal (Palliative NP) and myself together discussed potential medication regimens for COMFORT MEASURES.  Offered dilaudid and ativan gtt's with continuation of Precedex (concern for tolerance given high prolonged requirements of narcotic and benzo's) vs continuing with Propofol (at reduced dose) since it as been the most effective medication in keeping her comfortable, along with dilaudid and precedex gtt's, and prn Ativan pushes as needed.  Family is in agreement and consent to  continuing with Propofol gtt, along with Dilaudid and Precedex gtt's, and prn ativan as needed.   Code status:   Code Status: DNR   Disposition: In-patient comfort care  Time spent for the meeting: 25 minutes    Vanessa Oneal, AGACNP-BC [facility] Prefer epic messenger for cross cover needs If after hours, please call E-link  Vanessa Modest, NP  04/04/2023, 3:45 PM

## 2023-05-05 NOTE — TOC Progression Note (Addendum)
Talked to [place] at [facility].  Referral number given on 04/16/23. Eye care previously performed.

## 2023-05-05 NOTE — Progress Notes (Signed)
   05/01/2023 1300  Spiritual Encounters  Type of Visit Follow up  Care provided to: Pt and family  Referral source Nurse (RN/NT/LPN)  Reason for visit End-of-life  OnCall Visit Yes  Spiritual Framework  Presenting Themes Meaning/purpose/sources of inspiration;Community and relationships  Community/Connection Family  Patient Stress Factors Not reviewed  Family Stress Factors Lack of knowledge;Exhausted;Major life changes  Interventions  Spiritual Care Interventions Made Established relationship of care and support;Compassionate presence;Reflective listening;Normalization of emotions;Explored ethical dilemma;Prayer  Intervention Outcomes  Outcomes Awareness of support;Patient family open to resources  Spiritual Care Plan  Spiritual Care Issues Still Outstanding Lunette Stands will continue to follow   Receive call from ICU nurse patient family having to make difficult decision to remove Mothers from ventilator. Prayer with family as they struggle to make clear decision. Advise them that we are here for them 24/7.

## 2023-05-05 NOTE — Progress Notes (Signed)
Daily Progress Note   Patient Name: Vanessa Oneal       Date: 04/04/2023 DOB: [birth year]  Age: 72 y.o. MRN#: [id number] Attending Physician: Vida Rigger, MD Primary Care Physician: Dorcas Carrow, DO Admit Date: 03/31/2023  Reason for Consultation/Follow-up: Establishing goals of care  HPI/Brief Hospital Review: 72 y.o female w/ significant PMH of COPD, Emphysema, GERD, RA, Fibromyalgia, tobacco abuse, h/o drug abuse (cocaine). EtOH Abuse, Anxiety and Depression and recent diagnosis of chest wall mass concerning for Lymphadenopathy who presented to the ED with chief complaints of acute respiratory distress.   On review of chart, patient recently presented to [facility] emergency room on 03/27/2023 with complaints of chest pain.  CTA chest and CT abdomen pelvis with contrast was obtained which showed extensive mediastinal soft tissue mass and a large retroperitoneal soft tissue mass favored to represent lymphoma. Patient was referred to outpatient oncology for further evaluation.  Patient was evaluated by oncologist Dr. Cathie Hoops on 03/29/2023 who recommended a PET scan and ultrasound-guided biopsy of the right supraclavicular lymphadenopathy for further evaluation - never completed PTA.   Prior to presenting to the ED, patient had been complaining of progressive shortness of breath  all day and was found with oxygen saturation of 66% on room air.  She was placed on a nonrebreather and given Solu-Medrol 125 and 2 albuterol DuoNebs prior to transporting to the ED for further evaluation.   5/7 - trach placed   5/12 - GOC discussions with patient's sons and niece. Diagnosis of neuroendocrine/SCLC confirmed.    Palliative Medicine consulted for assisting with goals of care conversations.  5/17-throughout  week family with disconnect regarding decision making, planned family meeting again at bedside including both sons-[name] and [name] with anticipation to transition to comfort care  Subjective: Extensive chart review has been completed prior to meeting patient including labs, vital signs, imaging, progress notes, orders, and available advanced directive documents from current and previous encounters.    Visited with Ms. Lennox at bedside. Called and spoke with son-[name] who shared he plans to visit bedside later today with other family.  Collaborated with CCM team. Family meeting with Jerre Simon and [name]-nice along with Harlon Ditty, NP with CCM were in attendance. We discussed goals of care in the setting of severe lung disease secondary to end stage COPD and  newly diagnosed neuroendocrine/small cell lung cancer evaluated by oncology and not felt to be a candidate for chemotherapy at this time. We discussed throughout her stay she has been unable to liberate from mechanical ventilation and despite ongoing mechanical ventilation she continues to struggle with breathing if she does not remain heavily sedated.  All family agree that Ms. Bellavance would not want to live her life this way-in the past she has shared with the she would never want to live in a nursing facility. They all share they feel she has had an ongoing decline throughout her hospitalization. All family present are in agreement with transitioning to comfort measures.  We (myself and Jeri Modena, NP) discussed potential medication regimens used for comfort measures. Discussed with family options such as utilizing dilaudid, ativan and Precedex but we expressed our concerns for high tolerance given high prolonged requirements of narcotics and benzo's with ongoing signs of suffering versus continuing Propofol (reduced dose initially prior to liberating from MV) as this has been most effective at keeping her comfortable along with  continuing Precedex and adding Dilaudid continuous infusion and PRN Ativan IV push (d/c oxy and valium per tube). Family voices their agreement with continuing Propofol, addition of Dilaudid continuous infusion and Ativan IV push. Family voices their concern to prevent ongoing or further suffering during transition.  Answered and addressed all questions and concerns. PMT to continue to follow for ongoing needs and support. Emotional support provided to family and support persons.  Ongoing collaboration with CCM team and nursing staff    Objective: Vital Signs: BP 101/76   Pulse (!) 111   Temp 98.5 F (36.9 C) (Axillary)   Resp (!) 26   Ht 5\' 1"  (1.549 m)   Wt 41.6 kg   SpO2 (!) 89%   BMI 17.33 kg/m  SpO2: SpO2: (!) 89 % O2 Device: O2 Device: Ventilator O2 Flow Rate: O2 Flow Rate (L/min): 5 L/min   Palliative Care Assessment & Plan   Assessment/Recommendation/Plan  DNR Transition to comfort measures Continue Propofol infusion-updated to reflect palliative order set as requested by CCM team, addition of Dilaudid continuous infusion with bolus as needed, Ativan IV push PRN D/C Oxycodone and Valium via tube Chaplain services visited and offered support to family as requested PMT to continue to follow for ongoing needs and support  Care plan was discussed with CCM and nursing staff  Thank you for allowing the Palliative Medicine Team to assist in the care of this patient.  Total time: 65 minutes  Time spent includes: Detailed review of medical records (labs, imaging, vital signs), medically appropriate exam (mental status, respiratory, cardiac, skin), discussed with treatment team, counseling and educating patient, family and staff, documenting clinical information, medication management and coordination of care.  Leeanne Deed, DNP, AGNP-C Palliative Medicine   Please contact Palliative Medicine Team phone at [phone] for questions and concerns.

## 2023-05-05 NOTE — Progress Notes (Signed)
Patient went Asystole at 2037 per CCMD.  Patient was pronounced by Cicero Duck CN/Jorrell Kuster Primary RN at 2040.   Informed E-link and PCCM NP [place].  I talked to son [name] and he was informing his brother [name].  I called [name] the niece and she will let me know if son [name] is coming and what funeral home she is going to.    [name]

## 2023-05-05 NOTE — Progress Notes (Signed)
   04/17/2023 0900  Spiritual Encounters  Type of Visit Follow up (Will return to talk to nurse no one was in patient room at that time.)  Referral source Nurse (RN/NT/LPN)  Spiritual Care Plan  Spiritual Care Issues Still Outstanding Chaplain will continue to follow   Will return to speak to patient nurse no family was in the room at the time I visit. Patient is on comfort care and is on end of life watch.

## 2023-05-05 NOTE — Progress Notes (Signed)
NAME:  Vanessa Oneal, MRN:  [id number], DOB:  03-31-1951, LOS: 20 ADMISSION DATE:  03/31/2023, CONSULTATION DATE:  03/31/2023 REFERRING MD:  ED Provider, CHIEF COMPLAINT:  Respiratory Failure   Brief Pt Description / Synopsis:  Patient is a 72 year old female with a past medical history of COPD/emphysema presenting to the hospital with acute on chronic hypoxic and hypercapnic respiratory due to Acute COPD Exacerbation requiring intubation and mechanical ventilation. Course has been complicated by failure to wean off the ventilator secondary to extremely elevated sedation requirements.   History of Present Illness:  72 y.o female w/ significant PMH of COPD, Emphysema, GERD, RA, Fibromyalgia, tobacco abuse, h/o drug abuse (cocaine). EtOH Abuse, Anxiety and Depression and recent diagnosis of chest wall mass concerning for Lymphadenopathy who presented to the ED with chief complaints of acute respiratory distress.   On review of chart, patient recently presented to [facility] emergency room on 03/27/2023 with complaints of chest pain.  CTA chest and CT abdomen pelvis with contrast was obtained which showed extensive mediastinal soft tissue mass and a large retroperitoneal soft tissue mass favored to represent lymphoma. Patient was referred to outpatient oncology for further evaluation.  Patient was evaluated by oncologist Dr. Cathie Hoops on 03/29/2023 who recommended a PET scan and ultrasound-guided biopsy of the right supraclavicular lymphadenopathy for further evaluation.  She also had myeloma panel and peripheral blood flow cytometric and LDH obtained.  Prior to presenting to the ED, patient had been complaining of progressive shortness of breath  all day and was found with oxygen saturation of 66% on room air.  She was placed on a nonrebreather and given Solu-Medrol 125 and 2 albuterol DuoNebs prior to transporting to the ED for further evaluation.   ED Course: Initial vital signs showed HR of 77 beats/minute, BP  90/60 mm Hg, the RR 22 breaths/minute, and the oxygen saturation 78% on NRB and a temperature of 98.63F (36.8C).  He was noted to be in acute respiratory distress with increased work of breathing.  Due to concerns for impending respiratory arrest, patient was intubated for airway protection.   04/16/23- patient remains mildy aggitated this am despite versed and precedex drip infusing, she is receiving OG feeds.  Plan for family meeting and potentially de-escalation of therapy this afternoon.   04/17/23- family came in yesterday but did not agree to comfort measures.  They wish for patient to remain on sedation via IV infusion for now so they can meet with her.  Data shows electrolyte derangements with severe protein calorie malnutrition and chronic anemia.  She does shows signs of severe discomfort on artifical life support with mechanical ventilation but with infusion of anlagosedation she seems to have less suffering.  I anticipate in hospital death due to very poor prognosis with multiple comorbidities.   04/18/23 - patients family is here today for additional discussion regarding comfort measures.  Palliative care personnel is with [place] during comfort care discussion.   04/19/23- -family was not ready to transition to comfort care and wish to keep patient on full support at this time.  Ventilator management performed , patient on PRVC, resistance and compliance is within acceptable parameters.  05/04/2023- patient for possible comfort measures today at 1pm, family requesting meeting regarding patients prognosis.    Pertinent  Medical History   Past Medical History:  Diagnosis Date   Arthritis    RA   Depression    Fibromyalgia    GERD (gastroesophageal reflux disease)    H/O drug dependence (HCC)  opiods   Wears dentures    full upper    Micro Data:  4/27: COVID-19/RSV/Flu PCR>>negative 4/27: Respiratory viral panel>>negative 4/27: Strep pneumo urinary antigen>>negative 4/27: Legionella  urinary antigen>>negative 4/27: Blood culture x2>>negative 4/27: MRSA PCR>>negative 4/27: BAL>> no growth  Antimicrobials:   Anti-infectives (From admission, onward)    Start     Dose/Rate Route Frequency Ordered Stop   04/15/23 1000  Ampicillin-Sulbactam (UNASYN) 3 g in sodium chloride 0.9 % 100 mL IVPB        3 g 200 mL/hr over 30 Minutes Intravenous Every 6 hours 04/15/23 0826 04/17/23 2134   04/13/23 0345  Ampicillin-Sulbactam (UNASYN) 3 g in sodium chloride 0.9 % 100 mL IVPB  Status:  Discontinued        3 g 200 mL/hr over 30 Minutes Intravenous Every 6 hours 04/13/23 0248 04/15/23 0826   04/01/23 0500  vancomycin (VANCOREADY) IVPB 500 mg/100 mL  Status:  Discontinued        500 mg 100 mL/hr over 60 Minutes Intravenous Every 24 hours 03/31/23 0643 04/01/23 1021   03/31/23 1800  ceFEPIme (MAXIPIME) 2 g in sodium chloride 0.9 % 100 mL IVPB  Status:  Discontinued        2 g 200 mL/hr over 30 Minutes Intravenous Every 12 hours 03/31/23 0643 04/04/23 1057   03/31/23 0100  vancomycin (VANCOCIN) IVPB 1000 mg/200 mL premix        1,000 mg 200 mL/hr over 60 Minutes Intravenous  Once 03/31/23 0048 03/31/23 0220   03/31/23 0100  ceFEPIme (MAXIPIME) 2 g in sodium chloride 0.9 % 100 mL IVPB        2 g 200 mL/hr over 30 Minutes Intravenous  Once 03/31/23 0048 03/31/23 0201        Significant Hospital Events: Including procedures, antibiotic start and stop dates in addition to other pertinent events   4/27: Chest Xray>> Endotracheal tube tip about 3.5 cm superior to carina. Esophageal tube tip below the diaphragm but incompletely visualized. Diffuse bilateral interstitial and hazy lung opacity, either representing edema or diffuse lung infection. More focal nodular opacity in the right mid lung may be infectious/ inflammatory or neoplastic in etiology. 4/27: CTA Chest/Abdomen/Pelvis>>Moderate left and small right pleural effusions. Emphysema. Diffuse bilateral septal thickening suggests  underlying edema. Heterogeneous bilateral airspace consolidations throughout the lungs suspicious for multifocal pneumonia. Extensive cystic/low-density right supraclavicular, mediastinal, hilar, upper abdominal and retroperitoneal adenopathy. Differential considerations include necrotic metastatic disease, infectious etiology such as tuberculous or nontuberculous mycobacterial infection, and lymphoproliferative disease. Endotracheal tube tip about 1.2 cm superior to carina. Esophageal tube tip just beyond GE junction, suggest further advancement for more optimal positioning, there is moderate fluid distension of the stomach Small volume abdominopelvic ascites. Aortic Atherosclerosis 4/28: Pt remains mechanically intubated vent settings: FiO2 50%/PEEP 5.  Pt with severe anxiety on precedex and fentanyl gtts will start antianxiety medications per tubes  4/29: severe COPD, alert, unable to wean from vent 5/1: s/p PEG and LN biopsy 5/2: remains on vent, severe end stage COPD, failure to wean from vent 5/3: remains on vent, severe COPD, failure to wean from vent 5/4: failure to wean from vent 5/6: Pt remains mechanically intubated heavily sedated with propofol/versed/fentanyl gtts due to severe anxiety/agitation pending trach placement 05/7 5/7: Pt underwent tracheostomy placement size #6 shiley per ENT.  Will start weaning sedatives  5/9: Patient awake, follows commands, on trach collar  5/10: Overnight pt severely agitated with increased WOB.  Required Propofol for a time, sedated  this morning.  CXR with worsening of bilateral airspace opacities~ gently diuresed and placed on empiric Unasyn. 5/11: Pt weaned off versed gtt and sedated with precedex gtt @1 .2 mcg/kg/hr tolerating PS 10/5  Interim History / Subjective:  As outlined above under significant events   Objective   Blood pressure 92/70, pulse 100, temperature 98.5 F (36.9 C), temperature source Axillary, resp. rate (!) 29, height 5\' 1"   (1.549 m), weight 41.6 kg, SpO2 95 %.    Vent Mode: PRVC FiO2 (%):  [24 %-35 %] 24 % Set Rate:  [18 bmp] 18 bmp Vt Set:  [380 mL] 380 mL PEEP:  [5 cmH20] 5 cmH20 Plateau Pressure:  [20 cmH20-25 cmH20] 25 cmH20   Intake/Output Summary (Last 24 hours) at 04/29/2023 1610 Last data filed at 04/22/2023 9604 Gross per 24 hour  Intake 1393.13 ml  Output 540 ml  Net 853.13 ml    Filed Weights   04/18/23 0300 04/19/23 0300 05/03/2023 0300  Weight: 40.9 kg 40.7 kg 41.6 kg    Examination: General: Acute on chronically ill appearing cachectic female, NAD mechanically ventilated via tracheostomy  HENT: Atraumatic, normocephalic, neck supple, no JVD, size #6 shiley tracheostomy midline clean/intact  Lungs: Diffuse rhonchi throughout, even, non labored  Cardiovascular: RRR, s1s2, no M/R/G, 2+ radial/1+ distal pulses, no edema  Abdomen: +BS x4, soft, mildly distended, PEG clean dry and intact Extremities: No deformities, no edema Neuro: Lightly sedated, following commands, PERRL GU: Foley catheter in place draining yellow urine  Resolved Hospital Problem list     Assessment & Plan:   #Acute on Chronic Hypoxic & Hypercapnic Respiratory Failure due to AECOPD Presented with respiratory failure secondary to pneumonia and COPD exacerbation. Treated with antibiotics, steroids, and nebulizer therapy. She's failed to wean off the ventilator, and previously refused extubation during goals of care discussions. She is s/p tracheostomy,  - Full vent support, implement lung protective strategies - Plateau pressures less than 30 cm H20 - Wean FiO2 & PEEP as tolerated to maintain O2 sats 88 to 92% - Follow intermittent Chest X-ray & ABG as needed - Spontaneous Breathing Trials / Trach collar trials as tolerated - Implement VAP Bundle - Bronchodilators - ABX as above - Diuresis as BP and renal function permits   #HFrEF (Systolic and Diastolic dysfunction) #Demand ischemia TTE from 04/01/2023 with  systolic and diastolic dysfunction. Troponin elevated on presentation (peak 871), attributed to demand. Hemodynamically stable. Continue to monitor. - Continuous cardiac monitoring - Maintain MAP >65 - Vasopressors as needed to maintain MAP goal ~ not requirijng - BNP was 634 - Diuresis as BP and renal function permits: 20 mg iv lasix x1 dose 05/12   #Hypomagnesia  - Trend BMP  - Replace electrolytes as indicated  - Monitor UOP  #Metastatic Malignancy #Extensive Stage Small Cell Lung Cancer (neuroendocrine) She had a lymph node biopsies secondary to concern for malignancy (with result returning positive for metastatic malignancy). Neuroendocrine/small cell lung cancer, extensive stage. Patient is not a current candidate for any chemotherapy, and oncology will hold off on evaluating her at the moment, thou she could benefit from being evaluated by them in the future.  #New leukocytosis concerning for possible aspiration pneumonia~improving   - Trend WBC and monitor fever curve  - Continue empiric Unasyn stop date 04/10/2023  #Anxiety #Analgo-sedation She is now requiring dexmedetomidine and midazolam infusions for sedation, which we are attempting to wean off by increasing the dose of parenteral medications. -Maintain a RASS goal of 0 - Precedex gtt  will attempt to wean versed gtt off first  - Continue scheduled valium, oxycodone, and seroquel  - Follow Qtc    #Gastrointestinal - Continue tube feeds. H2B for SUP - KUB pending due to mild abdominal distension    #Renal Kidney function at baseline. Continue to monitor and replete electrolytes as needed   #Endocrine - Continue SSI  - CBG's q4hrs      Pt with severe COPD, Metastatic Malignancy/Extensive Stage Small Cell Lung Cancer (neuroendocrine), overall long term prognosis is extremely poor.  Recommend DNR status.   Best Practice (right click and "Reselect all SmartList Selections" daily)   Diet/type: tubefeeds and NPO DVT  prophylaxis: prophylactic heparin  GI prophylaxis: H2B Lines: Right upper extremity PICC line and still needed  Foley:  Yes, and it is still needed (urinary retention) Code Status:  full code Last date of multidisciplinary goals of care discussion [04/14/2023]  5/11: Will update pt's family when they arrive at bedside. Labs   CBC: Recent Labs  Lab 04/16/23 0419 04/17/23 0554 04/18/23 0350 04/19/23 0401 05/04/2023 0357  WBC 5.6 6.5 6.1 8.0 8.6  HGB 7.3* 8.0* 8.3* 8.5* 9.2*  HCT 22.1* 24.9* 25.2* 26.2* 28.3*  MCV 94.0 95.0 95.5 95.3 94.6  PLT 221 268 274 336 357     Basic Metabolic Panel: Recent Labs  Lab 04/15/23 0402 04/16/23 0419 04/17/23 0554 04/18/23 0350 04/19/23 0401  NA 140 141 132* 132* 131*  K 3.2* 3.4* 4.1 4.1 4.3  CL 101 98 98 96* 93*  CO2 25 25 27 28 29   GLUCOSE 185* 132* 165* 167* 117*  BUN 17 15 18 17  27*  CREATININE 0.58 0.59 0.43* 0.39* 0.40*  CALCIUM 7.2* 7.2* 8.4* 8.7* 8.7*  MG 1.6* 1.7 1.8 1.7 1.8  PHOS 4.2 3.6 4.7* 4.2 3.5    GFR: Estimated Creatinine Clearance: 42.4 mL/min (A) (by C-G formula based on SCr of 0.4 mg/dL (L)). Recent Labs  Lab 04/14/23 0356 04/15/23 0402 04/17/23 0554 04/18/23 0350 04/19/23 0401 04/23/2023 0357  PROCALCITON 0.12  --   --   --   --   --   WBC 8.5   < > 6.5 6.1 8.0 8.6   < > = values in this interval not displayed.     Liver Function Tests: Recent Labs  Lab 04/15/23 0402 04/16/23 0419 04/17/23 0554 04/18/23 0350 04/19/23 0401  ALBUMIN 2.0* 1.9* 2.0* 2.1* 2.2*    No results for input(s): "LIPASE", "AMYLASE" in the last 168 hours. No results for input(s): "AMMONIA" in the last 168 hours.  ABG    Component Value Date/Time   PHART 7.5 (H) 04/13/2023 0242   PCO2ART 38 04/13/2023 0242   PO2ART 79 (L) 04/13/2023 0242   HCO3 29.6 (H) 04/13/2023 0242   O2SAT 97 04/13/2023 0242     Coagulation Profile: No results for input(s): "INR", "PROTIME" in the last 168 hours.   Cardiac Enzymes: No  results for input(s): "CKTOTAL", "CKMB", "CKMBINDEX", "TROPONINI" in the last 168 hours.  HbA1C: HB A1C (BAYER DCA - WAIVED)  Date/Time Value Ref Range Status  01/05/2020 03:31 PM 4.8 <7.0 % Final    Comment:                                          Diabetic Adult            <7.0  Healthy Adult        4.3 - 5.7                                                           (DCCT/NGSP) American Diabetes Association's Summary of Glycemic Recommendations for Adults with Diabetes: Hemoglobin A1c <7.0%. More stringent glycemic goals (A1c <6.0%) may further reduce complications at the cost of increased risk of hypoglycemia.   11/19/2017 01:41 PM 4.9 <7.0 % Final    Comment:                                          Diabetic Adult            <7.0                                       Healthy Adult        4.3 - 5.7                                                           (DCCT/NGSP) American Diabetes Association's Summary of Glycemic Recommendations for Adults with Diabetes: Hemoglobin A1c <7.0%. More stringent glycemic goals (A1c <6.0%) may further reduce complications at the cost of increased risk of hypoglycemia.    Hgb A1c MFr Bld  Date/Time Value Ref Range Status  04/01/2023 03:59 AM 6.2 (H) 4.8 - 5.6 % Final    Comment:    (NOTE) Pre diabetes:          5.7%-6.4%  Diabetes:              >6.4%  Glycemic control for   <7.0% adults with diabetes   07/18/2022 01:12 AM 5.1 4.8 - 5.6 % Final    Comment:    (NOTE) Pre diabetes:          5.7%-6.4%  Diabetes:              >6.4%  Glycemic control for   <7.0% adults with diabetes     CBG: Recent Labs  Lab 04/19/23 1554 04/19/23 1930 04/19/23 2318 04/04/2023 0322 04/27/2023 0736  GLUCAP 148* 130* 147* 137* 125*     Review of Systems:   Unable to assess due to sedation  Past Medical History:  She,  has a past medical history of Arthritis, Depression, Fibromyalgia, GERD (gastroesophageal  reflux disease), H/O drug dependence (HCC), and Wears dentures.   Surgical History:   Past Surgical History:  Procedure Laterality Date   BREAST SURGERY     Silicone implants, then removal   CATARACT EXTRACTION W/PHACO Left 05/06/2019   Procedure: CATARACT EXTRACTION PHACO AND INTRAOCULAR LENS PLACEMENT (IOC) LEFT;  Surgeon: Galen Manila, MD;  Location: [facility];  Service: Ophthalmology;  Laterality: Left;   IR GASTROSTOMY TUBE MOD SED  04/04/2023   IR US LIVER BIOPSY  04/04/2023   TRACHEOSTOMY TUBE PLACEMENT N/A 04/10/2023   Procedure: TRACHEOSTOMY;  Surgeon: Linus Salmons,  MD;  Location: [facility];  Service: ENT;  Laterality: N/A;   TUBAL LIGATION       Social History:   reports that she has been smoking cigarettes. She has a 14.00 pack-year smoking history. She has never used smokeless tobacco. She reports that she does not currently use alcohol. She reports that she does not use drugs.   Family History:  Her family history includes COPD in her sister; Suicidality in her father.   Allergies Allergies  Allergen Reactions   Gabapentin Anaphylaxis     Home Medications  Prior to Admission medications   Medication Sig Start Date End Date Taking? Authorizing Provider  naloxone [facility]) nasal spray 4 mg/0.1 mL 1 spray into the nostril 1x, may repeat dose in alternate nostrils every 2-3 minutes until EMS arrives 03/29/23  Yes Johnson, Megan P, DO  promethazine (PHENERGAN) 25 MG tablet TAKE 1/2 TABLET BY MOUTH EVERY 8 HOURS AS NEEDED FOR NAUSEA AND VOMITING 07/31/22  Yes Johnson, Megan P, DO  albuterol (VENTOLIN HFA) 108 (90 Base) MCG/ACT inhaler Inhale 2 puffs into the lungs every 6 (six) hours as needed for wheezing or shortness of breath. Patient not taking: Reported on 03/29/2023 07/23/22   Kathrynn Running, MD  clonazePAM (KLONOPIN) 0.5 MG tablet Take by mouth.    [provider]  Fluticasone-Umeclidin-Vilant (TRELEGY ELLIPTA) 100-62.5-25 MCG/ACT AEPB Inhale 1  Inhalation into the lungs daily in the afternoon. Patient not taking: Reported on 03/29/2023 07/31/22   Dorcas Carrow, DO     Critical care provider statement:   Total critical care time: 33 minutes   Performed by: Karna Christmas MD   Critical care time was exclusive of separately billable procedures and treating other patients.   Critical care was necessary to treat or prevent imminent or life-threatening deterioration.   Critical care was time spent personally by me on the following activities: development of treatment plan with patient and/or surrogate as well as nursing, discussions with consultants, evaluation of patient's response to treatment, examination of patient, obtaining history from patient or surrogate, ordering and performing treatments and interventions, ordering and review of laboratory studies, ordering and review of radiographic studies, pulse oximetry and re-evaluation of patient's condition.    Vida Rigger, M.D.  Pulmonary & Critical Care Medicine

## 2023-05-05 NOTE — Progress Notes (Signed)
RT note:  Patient on comfort care status.  Placed in PSV to assure minimal-no distress prior to discontinuation.  Vent removed at 1635 with family at bedside.

## 2023-05-05 NOTE — Plan of Care (Signed)
Discussed with niece in room in front of patiient plan of care for the evening, comfort care and breathing with some teach back by family.    Problem: Education: Goal: Knowledge of General Education information will improve Description: Including pain rating scale, medication(s)/side effects and non-pharmacologic comfort measures Outcome: Progressing   Problem: Health Behavior/Discharge Planning: Goal: Ability to manage health-related needs will improve Outcome: Progressing

## 2023-05-05 NOTE — Death Summary Note (Signed)
DEATH SUMMARY   Patient Details  Name: Vanessa Oneal MRN: [id number] DOB: 01-01-1951  Admission/Discharge Information   Admit Date:  2023/04/23  Date of Death: Date of Death: 05/13/2023  Time of Death: Time of Death: 05-21-36  Length of Stay: 05-16-23  Referring Physician: Dorcas Carrow, DO   Reason(s) for Hospitalization  Acute on chronic respiratory failure secondary to Endstage COPD,  sepsis from Multifocal Pneumonia and  Extensive stage primary small cell carcinoma of lung (HCC)   Diagnoses  Preliminary cause of death: Acute on chronic respiratory failure secondary to Endstage COPD,  sepsis from Multifocal Pneumonia and Extensive stage primary small cell carcinoma of lung (HCC)  Secondary Diagnoses (including complications and co-morbidities):  Principal Problem:   Acute on chronic respiratory failure with hypoxia and hypercapnia (HCC) Active Problems:   Multifocal pneumonia   COPD exacerbation (HCC)   Acute respiratory failure with hypoxia (HCC)   Sepsis (HCC)   Extensive stage primary small cell carcinoma of lung [facility])  Brief Hospital Course (including significant findings, care, treatment, and services provided and events leading to death)  Vanessa Oneal is a 72 y.o. year old female w/ significant PMH of COPD, Emphysema, GERD, RA, Fibromyalgia, tobacco abuse, h/o drug abuse (cocaine). EtOH Abuse, Anxiety and Depression and recent diagnosis of chest wall mass concerning for Lymphadenopathy who presented to the ED with chief complaints of acute respiratory distress.   ED Course: Initial vital signs showed HR of 77 beats/minute, BP 90/60 mm Hg, the RR 22 breaths/minute, and the oxygen saturation 78% on NRB and a temperature of 98.37F (36.8C).  He was noted to be in acute respiratory distress with increased work of breathing.  Due to concerns for impending respiratory arrest, patient was intubated for airway protection. Patient given 30 cc/kg of fluids and started on broad-spectrum  antibiotics Vanco cefepime and Flagyl for sepsis. PCCM consulted for admission.  ICU Course: 2023-04-23: Patient admitted to ICU and underwent bronchoscopy. She was subsequently extubated earlier that morning post bronch and placed on supplemental oxygen via [place]. That evening patient was noted to be in  acute respiratory distress. She was noted to be tripoding, using accessory muscle and unable to speak in complete sentences. Decision was made to emergently re-intubate for airway protection. 4/28: Pt remained mechanically intubated vent settings: FiO2 50%/PEEP 5. Pt with severe anxiety on precedex and fentanyl gtts, she was started on antianxiety medications per tubes: 4/29: severe COPD, alert, unable to wean from vent 5/1: s/p PEG and LN biopsy 5/2: remains on vent, severe end stage COPD, failure to wean from vent 5/3: remains on vent, severe COPD, failure to wean from vent 5/4: failure to wean from vent 5/6: Pt remains mechanically intubated heavily sedated with propofol/versed/fentanyl gtts due to severe anxiety/agitation pending trach placement 05/7 5/7: Pt underwent tracheostomy placement size #6 shiley per ENT.  Will start weaning sedatives  5/9: Patient awake, follows commands, on trach collar  5/10: Overnight pt severely agitated with increased WOB.  Required Propofol for a time, sedated this morning.  CXR with worsening of bilateral airspace opacities~ gently diuresed and placed on empiric Unasyn. 5/11: Pt weaned off versed gtt and sedated with precedex gtt @1 .2 mcg/kg/hr tolerating PS 10/5  5/12:Goals of care discussed with patient's family 5/13: Remained critically ill on the vent with poor prognosis and high sedation requirment.Goals of care again discussed with patient's sons who stated they will arrive at pts bedside tomorrow 04/16/23 to continue goals of care discussions.  They both  stated they do not want the pt to suffer and she would not want to go to a facility.  5/14-5/16: Continued  goal;s of care discussion with tentative plan for withdrawal on Apr 24, 2023 04/24/23: See detailed goals of care as documented in IPAL.lfamily verbalize that they "do not want to see her suffer anymore", and they also all verbalize that she has mentioned to them in the past that she would not want to live a in a facility.  Vanessa Oneal mentions "it is time to let her go". They are all in agreement with shifting to COMFORT MEASURES ONLY and to withdraw all life sustaining measures. Patient extubated at 1635. Family is in agreement and consent to continuing with Propofol gtt, along with Dilaudid and Precedex gtt's, and prn ativan as needed. Patient passed away at 04-24-2023 at 8:37 pm. Family notified.  Pertinent Labs and Studies  Significant Diagnostic Studies DG Chest 1 View  Result Date: 04/16/2023 CLINICAL DATA:  [id number] Tracheostomy complication, unspecified complication type [facility]) [id number] EXAM: CHEST  1 VIEW COMPARISON:  04/14/2023 FINDINGS: Tracheostomy projects over the mid trachea, unchanged. Right PICC line tip is in the upper right atrium, also stable. Diffuse airspace disease throughout the left lung. Right upper lobe airspace opacity is stable. Underlying emphysema. No effusions or acute bony abnormality. IMPRESSION: Tracheostomy in expected position projecting over the mid trachea. Diffuse left lung airspace disease and right upper lobe opacity unchanged. COPD. Electronically Signed   By: Charlett Nose M.D.   On: 04/16/2023 22:32   DG Chest Port 1 View  Result Date: 04/14/2023 CLINICAL DATA:  Acute on chronic respiratory failure. EXAM: PORTABLE CHEST 1 VIEW COMPARISON:  04/12/2023 FINDINGS: Right arm PICC line tip terminates at the superior cavoatrial junction. Tracheostomy tube tip is above the carina. Stable cardiomediastinal contours. Emphysema. Diffuse interstitial and airspace opacities within the left lung and right upper lobe appear unchanged from the previous exam. No new findings. IMPRESSION: 1. No change  in aeration to the lungs compared with previous exam. 2. Stable support apparatus. Electronically Signed   By: Signa Kell M.D.   On: 04/14/2023 08:29   DG Abd 1 View  Result Date: 04/14/2023 CLINICAL DATA:  Abdominal distension EXAM: ABDOMEN - 1 VIEW COMPARISON:  04/08/2023 FINDINGS: Percutaneous gastrostomy tube in place. Normal bowel gas pattern. The stomach is displaced towards the left by known mass at the gastrohepatic ligament. IMPRESSION: Normal bowel gas pattern. Stomach displacement from known mass/adenopathy at the gastrohepatic ligament. Electronically Signed   By: Tiburcio Pea M.D.   On: 04/14/2023 08:29   DG Chest Port 1 View  Result Date: 04/12/2023 CLINICAL DATA:  Hypoxia EXAM: PORTABLE CHEST 1 VIEW COMPARISON:  04/08/2023, 03/31/2023 FINDINGS: Interval placement of tracheostomy tube, the tip is about 5 cm superior to carina. Right upper extremity central venous catheter tip at the proximal right atrium. Extensive bilateral airspace disease left greater than right with worsening since 04/08/2023. Probable small pleural effusions. Stable cardiomediastinal silhouette. IMPRESSION: 1. Tracheostomy tube tip about 5 cm superior to carina. 2. Extensive bilateral airspace disease left greater than right with worsening since 04/08/2023. Electronically Signed   By: Jasmine Pang M.D.   On: 04/12/2023 22:20   DG Chest Port 1 View  Result Date: 04/08/2023 CLINICAL DATA:  Respiratory failure. EXAM: PORTABLE CHEST 1 VIEW COMPARISON:  04/05/2023 and older exams. FINDINGS: Endotracheal tube, well positioned, tip 2.4 cm above the carina. Right PICC tip projects in the lower superior vena cava. Lungs demonstrate bilateral interstitial thickening and hyperexpansion.  Focal hazy opacity is noted in the right mid to upper lung with additional more diffuse hazy opacity in the left mid to lower lung. No visualized pleural effusion and no pneumothorax. Minimal pneumoperitoneum below the right hemidiaphragm,  overall pneumoperitoneum decreasing from the previous exam. IMPRESSION: 1. Similar appearance of lung aeration compared to the most recent prior study, allowing for differences in patient positioning. Bilateral irregular interstitial thickening with areas of hazy lung opacity suspected to be asymmetric edema. This is overall improved compared to the CT dated 03/31/2023. 2. Well-positioned support apparatus. Electronically Signed   By: Amie Portland M.D.   On: 04/08/2023 08:23   DG Abd 1 View  Result Date: 04/08/2023 CLINICAL DATA:  Pain. Recent diagnosis of a chest wall mass. History of COPD. EXAM: ABDOMEN - 1 VIEW COMPARISON:  04/01/2023 and older exams.  CT, 03/31/2023. FINDINGS: Mild gaseous distension of the colon, increased from the prior exam. No convincing small bowel dilation. Soft tissues are poorly defined. Surgical vascular clips are noted in the left mid abdomen, surgical vascular clips are noted in the left mid abdomen associated with a percutaneous gastrostomy tube, which were not present on the prior study. No acute skeletal abnormality. IMPRESSION: 1. Mild gaseous distension of the colon, nonspecific, likely a mild adynamic ileus. No evidence of small-bowel obstruction. 2. New percutaneous gastrostomy tube projects in the left mid abdomen Electronically Signed   By: Amie Portland M.D.   On: 04/08/2023 08:20   DG Chest Port 1 View  Result Date: 04/05/2023 CLINICAL DATA:  Intubated. EXAM: PORTABLE CHEST 1 VIEW COMPARISON:  Chest radiograph 04/01/2023. FINDINGS: The endotracheal tube tip is 2.8 cm from the carina. The right upper extremity vascular catheter appears to have been advanced and now terminates in the right atrium. The cardiomediastinal silhouette is grossly stable allowing for patient rotation. Aeration of the lungs appears improved since the prior study, particularly on the left. There is no new or worsening focal airspace disease. There is no significant pleural effusion. There is no  appreciable pneumothorax There is no acute osseous abnormality.  There is pneumoperitoneum. IMPRESSION: 1. Endotracheal tube tip proximally 2.8 cm from the carina. 2. Improved aeration of the lungs particularly on the left. 3. Pneumoperitoneum is likely related to recent G-tube placement; however, recommend correlation with physical exam. Electronically Signed   By: Lesia Hausen M.D.   On: 04/05/2023 13:51   IR LYMPH NODE CORE BIOPSY  Result Date: 04/05/2023 INDICATION: Lymphadenopathy EXAM: Ultrasound-guided core needle biopsy of right supraclavicular lymph node MEDICATIONS: None. ANESTHESIA/SEDATION: Local analgesia COMPLICATIONS: None immediate. PROCEDURE: Informed written consent was obtained from the patient after a thorough discussion of the procedural risks, benefits and alternatives. All questions were addressed. Maximal Sterile Barrier Technique was utilized including caps, mask, sterile gowns, sterile gloves, sterile drape, hand hygiene and skin antiseptic. A timeout was performed prior to the initiation of the procedure. The patient was placed supine on the exam table. Ultrasound of the right neck demonstrated enlarged right supraclavicular lymph node. Skin entry site was marked, and the overlying skin was prepped draped in the standard sterile fashion. Local analgesia was obtained with 1% lidocaine. Using ultrasound guidance, core needle biopsy was performed of the right supraclavicular lymph node using an 18 gauge core biopsy device x4 total passes. Specimens were submitted in saline to pathology for further handling. Limited postprocedure imaging demonstrated no hematoma. A clean dressing was placed after manual hemostasis. The patient tolerated the procedure well without immediate complication. IMPRESSION: Successful ultrasound-guided  core needle biopsy of enlarged right supraclavicular lymph node. Electronically Signed   By: Olive Bass M.D.   On: 04/05/2023 13:48   IR GASTROSTOMY TUBE MOD  SED  Result Date: 04/05/2023 INDICATION: Need for long-term enteric access, malnutrition EXAM: Placement of percutaneous gastrostomy tube using fluoroscopic guidance MEDICATIONS: Documented in the EMR ANESTHESIA/SEDATION: Sedation provided by critical care team CONTRAST:  50 mL Omnipaque 300-administered into the gastric lumen. FLUOROSCOPY: Radiation Exposure Index (as provided by the fluoroscopic device): 2.1 minutes (20 mGy) COMPLICATIONS: None immediate. PROCEDURE: Informed written consent was obtained from the patient after a thorough discussion of the procedural risks, benefits and alternatives. All questions were addressed. Maximal Sterile Barrier Technique was utilized including caps, mask, sterile gowns, sterile gloves, sterile drape, hand hygiene and skin antiseptic. A timeout was performed prior to the initiation of the procedure. Review of pre-procedure CT scan demonstrates an adequate window for percutaneous placement of a gastrostomy tube. Using the existing orogastric tube, the stomach was insufflated with air. The abdomen was prepped and draped in the standard sterile fashion. After insufflating the stomach with air, puncture sites were selected and local analgesia was obtained with 1% lidocaine. Using fluoroscopic guidance, a gastropexy needle was advanced into the stomach and the T-bar suture was released. Entry into the stomach was confirmed with fluoroscopy, aspiration of air, and injection of contrast material. This was repeated with an additional gastropexy suture (for a total of 2 fasteners). At the center of these gastropexy sutures, a dermatotomy was performed. An 18 gauge needle was then passed into the stomach, and position within the gastric lumen again confirmed under fluoroscopy using aspiration of air and injection of contrast material. An Amplatz guidewire was passed through this needle and intraluminal placement was confirmed with fluoroscopy. The needle was removed, and over the  guidewire, a 20 French balloon gastrostomy tube with a coaxial 10 mm balloon was advanced into the percutaneous tract. Dilation of the percutaneous tract was then performed using the balloon, followed by advancement of the gastrostomy tube into the gastric lumen. The retention balloon was then inflated with 20 mL of sterile water, and the tube was brought back to the gastric wall. The wire and balloon were removed. The external bumper was brought to the skin. Location of the gastrostomy tube within the stomach was then confirmed with injection of contrast material, opacifying the gastric lumen. The gastrostomy tube was flushed with sterile water, and secured to the skin using a dressing. The patient tolerated the procedure well without immediate complication, and was transferred to recovery in stable condition. IMPRESSION: Successful placement of a 20 French percutaneous balloon retention gastrostomy tube using fluoroscopic guidance. Refer to EMR instructions for use. Electronically Signed   By: Olive Bass M.D.   On: 04/05/2023 13:46   [place] CHEST (PLEURAL EFFUSION)  Result Date: 04/03/2023 CLINICAL DATA:  Concern for pleural effusions. Please perform chest ultrasound and ultrasound-guided thoracentesis as indicated. EXAM: CHEST ULTRASOUND COMPARISON:  Chest radiograph-03/24/2023; chest CT-03/31/2023 FINDINGS: Sonographic evaluation of the chest demonstrates trace bilateral pleural effusions, too small to allow for safe ultrasound-guided thoracentesis. No thoracentesis attempted. IMPRESSION: Trace bilateral pleural effusions, too small to allow for safe ultrasound-guided thoracentesis. No thoracentesis attempted. Electronically Signed   By: Simonne Come M.D.   On: 04/03/2023 16:03   DG Chest Port 1 View  Result Date: 04/01/2023 CLINICAL DATA:  Dyspnea EXAM: PORTABLE CHEST 1 VIEW COMPARISON:  03/31/2023 FINDINGS: Endotracheal tube and gastric catheter are noted in satisfactory position. Right-sided PICC is  seen with the catheter tip at the cavoatrial junction. The lungs are well aerated bilaterally. Diffuse airspace opacities are again identified similar to that seen on the prior exam. No new focal infiltrate is noted. IMPRESSION: Tubes and lines as described above. Stable airspace opacities bilaterally. Electronically Signed   By: Alcide Clever M.D.   On: 04/01/2023 21:37   DG Abd 1 View  Result Date: 04/01/2023 CLINICAL DATA:  Abdominal pain EXAM: ABDOMEN - 1 VIEW COMPARISON:  Abdominal x-ray 03/31/2023 FINDINGS: Enteric tube tip is in the gastric body. Bowel-gas pattern is nonobstructive. Rectal catheter in place. Stool burden is average. There is no evidence for free air on this supine view. No acute fractures are identified. There is scoliosis and degenerative change of the lumbar spine, unchanged. IMPRESSION: 1. Enteric tube tip is in the gastric body. 2. Nonobstructive bowel gas pattern. Electronically Signed   By: Darliss Cheney M.D.   On: 04/01/2023 15:20   ECHOCARDIOGRAM COMPLETE  Result Date: 04/01/2023    ECHOCARDIOGRAM REPORT   Patient Name:   SARAJANE NGUON Date of Exam: 04/01/2023 Medical Rec #:  [id number]        Height:       61.0 in Accession #:    [id number]       Weight:       86.6 lb Date of Birth:  [birth year]       BSA:          1.324 m Patient Age:    71 years         BP:           107/76 mmHg Patient Gender: F                HR:           83 bpm. Exam Location:  [place] Procedure: 2D Echo, Cardiac Doppler and Color Doppler Indications:     CHF-Acute Diastolic I50.31  History:         Patient has no prior history of Echocardiogram examinations.                  CAD and Aortic atherosclerosis.  Sonographer:     Louie Boston RDCS Referring Phys:  [id number] [facility] Seferino Oscar Diagnosing Phys: Julien Nordmann MD IMPRESSIONS  1. Left ventricular ejection fraction, by estimation, is 35 to 40%. Left ventricular ejection fraction by PLAX is 32 %. The left ventricle has moderately decreased  function. The left ventricle demonstrates global hypokinesis. Unable to exclude regional wall motion abnormality. Left ventricular diastolic parameters are consistent with Grade I diastolic dysfunction (impaired relaxation).  2. Right ventricular systolic function is normal. The right ventricular size is normal. There is normal pulmonary artery systolic pressure.  3. The mitral valve is normal in structure. Mild mitral valve regurgitation. No evidence of mitral stenosis.  4. The aortic valve is normal in structure. Aortic valve regurgitation is mild to moderate. Aortic valve sclerosis is present, with no evidence of aortic valve stenosis.  5. The inferior vena cava is normal in size with greater than 50% respiratory variability, suggesting right atrial pressure of 3 mmHg. FINDINGS  Left Ventricle: Left ventricular ejection fraction, by estimation, is 35 to 40%. Left ventricular ejection fraction by PLAX is 32 %. The left ventricle has moderately decreased function. The left ventricle demonstrates global hypokinesis. Definity contrast agent was given IV to delineate the left ventricular endocardial borders. The left ventricular internal cavity size was normal in size. There is no  left ventricular hypertrophy. Left ventricular diastolic parameters are consistent with Grade I diastolic dysfunction (impaired relaxation). Right Ventricle: The right ventricular size is normal. No increase in right ventricular wall thickness. Right ventricular systolic function is normal. There is normal pulmonary artery systolic pressure. The tricuspid regurgitant velocity is 2.14 m/s, and  with an assumed right atrial pressure of 15 mmHg, the estimated right ventricular systolic pressure is 33.3 mmHg. Left Atrium: Left atrial size was normal in size. Right Atrium: Right atrial size was normal in size. Pericardium: There is no evidence of pericardial effusion. Mitral Valve: The mitral valve is normal in structure. Mild mitral valve  regurgitation. No evidence of mitral valve stenosis. Tricuspid Valve: The tricuspid valve is normal in structure. Tricuspid valve regurgitation is mild . No evidence of tricuspid stenosis. Aortic Valve: The aortic valve is normal in structure. Aortic valve regurgitation is mild to moderate. Aortic regurgitation PHT measures 434 msec. Aortic valve sclerosis is present, with no evidence of aortic valve stenosis. Pulmonic Valve: The pulmonic valve was normal in structure. Pulmonic valve regurgitation is not visualized. No evidence of pulmonic stenosis. Aorta: The aortic root is normal in size and structure. Venous: The inferior vena cava is normal in size with greater than 50% respiratory variability, suggesting right atrial pressure of 3 mmHg. IAS/Shunts: No atrial level shunt detected by color flow Doppler.  LEFT VENTRICLE PLAX 2D LV EF:         Left            Diastology                ventricular     LV e' medial:    8.70 cm/s                ejection        LV E/e' medial:  9.0                fraction by     LV e' lateral:   9.46 cm/s                PLAX is 32      LV E/e' lateral: 8.3                %. LVIDd:         3.40 cm LVIDs:         2.90 cm LV PW:         0.90 cm LV IVS:        0.90 cm LVOT diam:     1.90 cm LV SV:         46 LV SV Index:   34 LVOT Area:     2.84 cm  LV Volumes (MOD) LV vol d, MOD    48.7 ml A2C: LV vol d, MOD    68.5 ml A4C: LV vol s, MOD    23.6 ml A2C: LV vol s, MOD    39.8 ml A4C: LV SV MOD A2C:   25.1 ml LV SV MOD A4C:   68.5 ml LV SV MOD BP:    28.3 ml RIGHT VENTRICLE            IVC RV Basal diam:  2.80 cm    IVC diam: 2.40 cm RV S prime:     8.81 cm/s TAPSE (M-mode): 1.9 cm LEFT ATRIUM             Index        RIGHT ATRIUM  Index LA diam:        2.90 cm 2.19 cm/m   RA Area:     10.60 cm LA Vol (A2C):   23.5 ml 17.75 ml/m  RA Volume:   20.90 ml  15.79 ml/m LA Vol (A4C):   20.2 ml 15.26 ml/m LA Biplane Vol: 21.7 ml 16.39 ml/m  AORTIC VALVE LVOT Vmax:   94.05 cm/s LVOT  Vmean:  61.300 cm/s LVOT VTI:    0.161 m AI PHT:      434 msec  AORTA Ao Root diam: 3.10 cm Ao Asc diam:  3.10 cm Ao Desc diam: 2.00 cm MITRAL VALVE               TRICUSPID VALVE MV Area (PHT): 5.13 cm    TR Peak grad:   18.3 mmHg MV Decel Time: 148 msec    TR Vmax:        214.00 cm/s MV E velocity: 78.10 cm/s MV A velocity: 78.60 cm/s  SHUNTS MV E/A ratio:  0.99        Systemic VTI:  0.16 m                            Systemic Diam: 1.90 cm Julien Nordmann MD Electronically signed by Julien Nordmann MD Signature Date/Time: 04/01/2023/2:45:46 PM    Final    [place] EKG SITE RITE  Result Date: 04/01/2023 If Site Rite image not attached, placement could not be confirmed due to current cardiac rhythm.  DG Chest 1 View  Result Date: 03/31/2023 CLINICAL DATA:  [id number] Endotracheally intubated [id number] EXAM: CHEST  1 VIEW COMPARISON:  chest CT 12/30/2022, checks x-ray 07/22/2022 FINDINGS: Endotracheal tube terminating 1.4 cm above the carina. Enteric tube coursing below the hemidiaphragm with tip and side port collimated off view. The heart and mediastinal contours are unchanged. Aortic calcification. No focal consolidation. Chronic coarsened markings with superimposed increased interstitial markings with overlying patchy airspace opacity. Bilateral trace pleural effusions. No pneumothorax. No acute osseous abnormality. IMPRESSION: 1. Endotracheal tube with tip 1.4 cm above the carina. Consider retracting by 1 cm. 2. Enteric tube coursing below the hemidiaphragm with tip and side port collimated off view. 3. Pulmonary edema with superimposed multifocal infection better evaluated on CT chest 03/31/2023. Followup PA and lateral chest X-ray is recommended in 3-4 weeks following therapy to ensure resolution and exclude underlying malignancy. 4. Bilateral trace pleural effusions. 5. Aortic Atherosclerosis (ICD10-I70.0) and Emphysema (ICD10-J43.9). Electronically Signed   By: Tish Frederickson M.D.   On: 03/31/2023 20:26   DG  Abd 1 View  Result Date: 03/31/2023 CLINICAL DATA:  Check gastric catheter placement EXAM: ABDOMEN - 1 VIEW COMPARISON:  Film from earlier in the same day. FINDINGS: Gastric catheter is noted extending into the stomach. No free air is seen. Scattered large and small bowel gas is noted. Degenerative changes of lumbar spine are noted. IMPRESSION: Gastric catheter within the stomach. Electronically Signed   By: Alcide Clever M.D.   On: 03/31/2023 20:21   DG Chest Port 1 View  Result Date: 03/31/2023 CLINICAL DATA:  Check endotracheal tube placement EXAM: PORTABLE CHEST 1 VIEW COMPARISON:  03/31/2023 FINDINGS: Cardiac shadow is stable. Endotracheal tube and gastric catheter are again noted in stable position. Aortic calcifications are seen. Bilateral airspace opacities are again identified slightly increased when compared with the prior exam likely related to increasing edema. No sizable effusion is noted. No pneumothorax is seen. IMPRESSION: Increasing airspace opacities  bilaterally likely representing progressive edema. Tubes and lines in satisfactory position. Electronically Signed   By: Alcide Clever M.D.   On: 03/31/2023 20:20   CT CHEST ABDOMEN PELVIS W CONTRAST  Result Date: 03/31/2023 CLINICAL DATA:  Sepsis retroperitoneal soft tissue mass EXAM: CT CHEST, ABDOMEN, AND PELVIS WITH CONTRAST TECHNIQUE: Multidetector CT imaging of the chest, abdomen and pelvis was performed following the standard protocol during bolus administration of intravenous contrast. RADIATION DOSE REDUCTION: This exam was performed according to the departmental dose-optimization program which includes automated exposure control, adjustment of the mA and/or kV according to patient size and/or use of iterative reconstruction technique. CONTRAST:  OMNIPAQUE IOHEXOL 300 MG/ML  SOLN COMPARISON:  Chest x-ray 03/31/2023, chest CT 07/18/2022 FINDINGS: CT CHEST FINDINGS Cardiovascular: Moderate aortic atherosclerosis. Normal cardiac  size. No pericardial effusion. Mediastinum/Nodes: Endotracheal tube tip about 1.2 cm superior to the carina. Esophageal tube tip in the proximal stomach slightly beyond the GE junction. Negative for thyroid mass. Extensive cystic/necrotic appearing adenopathy. Right supraclavicular node measures 2.8 cm, series 2, image 12. Numerous enlarged mediastinal and hilar nodes. Lobulated mass suspected to represent conglomerate adenopathy in the precarinal space, this measures 5.3 by 2.9 cm. Subcarinal nodes measuring up to 2.2 cm. Lungs/Pleura: Moderate left and small right pleural effusions. Emphysema. Diffuse bilateral septal thickening. Bilateral bronchial wall thickening. Heterogeneous consolidations throughout the bilateral lungs suspicious for multifocal infection. Musculoskeletal: No acute or suspicious osseous abnormality. CT ABDOMEN PELVIS FINDINGS Hepatobiliary: No focal hepatic abnormality. No calcified gallstone or biliary dilatation. Periportal edema. Pancreas: No pancreatic inflammatory changes. Spleen: Normal in size without focal abnormality. Adrenals/Urinary Tract: Adrenal glands are unremarkable. Kidneys are normal, without renal calculi, focal lesion, or hydronephrosis. Bladder contains Foley catheter. Stomach/Bowel: Moderate fluid distension of the stomach. No dilated small bowel. No acute bowel wall thickening. Vascular/Lymphatic: Moderate aortic atherosclerosis. No aneurysm. Lobulated low-density mass/masses within the upper abdomen within the gastrohepatic region, this measures approximately 7.8 by 5.8 cm. Additional low-density masses adjacent to the dominant mass. This appears to abut but not arise from the pancreas. Retroperitoneal lymph nodes measuring up to 1.6 cm also with central necrosis. Reproductive: Negative for adnexal mass. Other: No free air.  Small volume abdominopelvic ascites. Musculoskeletal: No acute or suspicious osseous abnormality. IMPRESSION: 1. Moderate left and small right  pleural effusions. Emphysema. Diffuse bilateral septal thickening suggests underlying edema. Heterogeneous bilateral airspace consolidations throughout the lungs suspicious for multifocal pneumonia. 2. Extensive cystic/low-density right supraclavicular, mediastinal, hilar, upper abdominal and retroperitoneal adenopathy. Differential considerations include necrotic metastatic disease, infectious etiology such as tuberculous or nontuberculous mycobacterial infection, and lymphoproliferative disease. 3. Endotracheal tube tip about 1.2 cm superior to carina. Esophageal tube tip just beyond GE junction, suggest further advancement for more optimal positioning, there is moderate fluid distension of the stomach 4. Small volume abdominopelvic ascites. Aortic Atherosclerosis (ICD10-I70.0) and Emphysema (ICD10-J43.9). Electronically Signed   By: Jasmine Pang M.D.   On: 03/31/2023 03:15   DG Chest Port 1 View  Result Date: 03/31/2023 CLINICAL DATA:  Shortness of breath history of tumor on chest EXAM: PORTABLE CHEST 1 VIEW COMPARISON:  CT 07/18/2022, chest x-ray 07/21/2022 FINDINGS: Endotracheal tube tip about 3.5 cm superior to carina. Esophageal tube tip below the diaphragm but incompletely visualized. Emphysema. Diffuse bilateral interstitial and hazy lung opacity. More focal nodular opacity in the right mid lung. Cardiac size is normal. Aortic atherosclerosis. No pneumothorax emphysema. IMPRESSION: 1. Endotracheal tube tip about 3.5 cm superior to carina. Esophageal tube tip below the diaphragm but incompletely  visualized. 2. Diffuse bilateral interstitial and hazy lung opacity, either representing edema or diffuse lung infection. More focal nodular opacity in the right mid lung may be infectious/inflammatory or neoplastic in etiology. Electronically Signed   By: Jasmine Pang M.D.   On: 03/31/2023 01:05    Microbiology No results found for this or any previous visit (from the past 240 hour(s)).  Lab Basic  Metabolic Panel: Recent Labs  Lab 04/15/23 0402 04/16/23 0419 04/17/23 0554 04/18/23 0350 04/19/23 0401  NA 140 141 132* 132* 131*  K 3.2* 3.4* 4.1 4.1 4.3  CL 101 98 98 96* 93*  CO2 25 25 27 28 29   GLUCOSE 185* 132* 165* 167* 117*  BUN 17 15 18 17  27*  CREATININE 0.58 0.59 0.43* 0.39* 0.40*  CALCIUM 7.2* 7.2* 8.4* 8.7* 8.7*  MG 1.6* 1.7 1.8 1.7 1.8  PHOS 4.2 3.6 4.7* 4.2 3.5   Liver Function Tests: Recent Labs  Lab 04/15/23 0402 04/16/23 0419 04/17/23 0554 04/18/23 0350 04/19/23 0401  ALBUMIN 2.0* 1.9* 2.0* 2.1* 2.2*   No results for input(s): "LIPASE", "AMYLASE" in the last 168 hours. No results for input(s): "AMMONIA" in the last 168 hours. CBC: Recent Labs  Lab 04/16/23 0419 04/17/23 0554 04/18/23 0350 04/19/23 0401 04/28/2023 0357  WBC 5.6 6.5 6.1 8.0 8.6  HGB 7.3* 8.0* 8.3* 8.5* 9.2*  HCT 22.1* 24.9* 25.2* 26.2* 28.3*  MCV 94.0 95.0 95.5 95.3 94.6  PLT 221 268 274 336 357   Cardiac Enzymes: No results for input(s): "CKTOTAL", "CKMB", "CKMBINDEX", "TROPONINI" in the last 168 hours. Sepsis Labs: Recent Labs  Lab 04/14/23 0356 04/15/23 0402 04/17/23 0554 04/18/23 0350 04/19/23 0401 04/25/2023 0357  PROCALCITON 0.12  --   --   --   --   --   WBC 8.5   < > 6.5 6.1 8.0 8.6   < > = values in this interval not displayed.    Procedures/Operations  See procedure noted   Loraine Leriche DNP 05/03/2023, 9:29 PM

## 2023-05-05 NOTE — Progress Notes (Signed)
Son [name] isn't coming per niece [name]. Funeral home still TBD.  Patient will be sent to the morgue

## 2023-05-05 DEATH — deceased

## 2023-05-07 ENCOUNTER — Ambulatory Visit: Payer: 59 | Admitting: Family Medicine

## 2023-05-17 LAB — ACID FAST CULTURE WITH REFLEXED SENSITIVITIES (MYCOBACTERIA): Acid Fast Culture: NEGATIVE

## 2023-05-18 LAB — MTB-RIF NAA W AFB CULT, NON-SPUTUM
